# Patient Record
Sex: Male | Born: 1980 | Race: White | Hispanic: No | Marital: Single | Smoking: Current every day smoker
Health system: Southern US, Community
[De-identification: ages and names within clinical notes are randomized; demographics above are authoritative.]

## PROBLEM LIST (undated history)

## (undated) DIAGNOSIS — M549 Dorsalgia, unspecified: Secondary | ICD-10-CM

## (undated) DIAGNOSIS — N200 Calculus of kidney: Secondary | ICD-10-CM

## (undated) DIAGNOSIS — B1920 Unspecified viral hepatitis C without hepatic coma: Secondary | ICD-10-CM

## (undated) DIAGNOSIS — F319 Bipolar disorder, unspecified: Secondary | ICD-10-CM

## (undated) DIAGNOSIS — F259 Schizoaffective disorder, unspecified: Secondary | ICD-10-CM

## (undated) DIAGNOSIS — N442 Benign cyst of testis: Secondary | ICD-10-CM

## (undated) DIAGNOSIS — F250 Schizoaffective disorder, bipolar type: Secondary | ICD-10-CM

---

## 2005-02-27 ENCOUNTER — Emergency Department (HOSPITAL_COMMUNITY): Admission: EM | Admit: 2005-02-27 | Discharge: 2005-02-27 | Payer: Self-pay | Admitting: Emergency Medicine

## 2005-03-24 ENCOUNTER — Emergency Department (HOSPITAL_COMMUNITY): Admission: EM | Admit: 2005-03-24 | Discharge: 2005-03-25 | Payer: Self-pay | Admitting: Emergency Medicine

## 2005-04-30 ENCOUNTER — Emergency Department: Payer: Self-pay | Admitting: Emergency Medicine

## 2005-05-03 ENCOUNTER — Emergency Department: Payer: Self-pay | Admitting: Emergency Medicine

## 2005-05-13 ENCOUNTER — Emergency Department (HOSPITAL_COMMUNITY): Admission: EM | Admit: 2005-05-13 | Discharge: 2005-05-13 | Payer: Self-pay | Admitting: Emergency Medicine

## 2005-05-17 ENCOUNTER — Emergency Department (HOSPITAL_COMMUNITY): Admission: EM | Admit: 2005-05-17 | Discharge: 2005-05-17 | Payer: Self-pay | Admitting: Emergency Medicine

## 2005-05-18 ENCOUNTER — Emergency Department (HOSPITAL_COMMUNITY): Admission: EM | Admit: 2005-05-18 | Discharge: 2005-05-18 | Payer: Self-pay | Admitting: Emergency Medicine

## 2005-06-08 ENCOUNTER — Emergency Department: Payer: Self-pay | Admitting: Emergency Medicine

## 2005-06-08 ENCOUNTER — Other Ambulatory Visit: Payer: Self-pay

## 2009-04-14 ENCOUNTER — Emergency Department (HOSPITAL_COMMUNITY): Admission: EM | Admit: 2009-04-14 | Discharge: 2009-04-15 | Payer: Self-pay | Admitting: Emergency Medicine

## 2009-07-30 ENCOUNTER — Emergency Department (HOSPITAL_COMMUNITY): Admission: EM | Admit: 2009-07-30 | Discharge: 2009-07-31 | Payer: Self-pay | Admitting: Emergency Medicine

## 2009-08-16 ENCOUNTER — Emergency Department (HOSPITAL_COMMUNITY): Admission: EM | Admit: 2009-08-16 | Discharge: 2009-08-16 | Payer: Self-pay | Admitting: Emergency Medicine

## 2009-10-23 ENCOUNTER — Emergency Department (HOSPITAL_COMMUNITY): Admission: EM | Admit: 2009-10-23 | Discharge: 2009-10-24 | Payer: Self-pay | Admitting: Emergency Medicine

## 2009-11-08 ENCOUNTER — Emergency Department (HOSPITAL_COMMUNITY): Admission: EM | Admit: 2009-11-08 | Discharge: 2009-11-08 | Payer: Self-pay | Admitting: Emergency Medicine

## 2010-09-21 LAB — URINALYSIS, ROUTINE W REFLEX MICROSCOPIC
Bilirubin Urine: NEGATIVE
Glucose, UA: NEGATIVE mg/dL
Hgb urine dipstick: NEGATIVE
Ketones, ur: NEGATIVE mg/dL
Nitrite: NEGATIVE
Protein, ur: NEGATIVE mg/dL
Specific Gravity, Urine: 1.01 (ref 1.005–1.030)
Urobilinogen, UA: 0.2 mg/dL (ref 0.0–1.0)
pH: 7 (ref 5.0–8.0)

## 2010-10-06 LAB — BASIC METABOLIC PANEL
BUN: 10 mg/dL (ref 6–23)
CO2: 30 mEq/L (ref 19–32)
Calcium: 9.5 mg/dL (ref 8.4–10.5)
Chloride: 104 mEq/L (ref 96–112)
Creatinine, Ser: 0.82 mg/dL (ref 0.4–1.5)
GFR calc Af Amer: >60 mL/min (ref >60)
GFR calc non Af Amer: >60 mL/min (ref >60)
Glucose, Bld: 99 mg/dL (ref 70–99)
Potassium: 3.7 mEq/L (ref 3.5–5.1)
Sodium: 137 mEq/L (ref 135–145)

## 2010-10-06 LAB — DIFFERENTIAL
Basophils Absolute: 0 10*3/uL (ref 0.0–0.1)
Basophils Relative: 0 % (ref 0–1)
Eosinophils Absolute: 0.1 10*3/uL (ref 0.0–0.7)
Eosinophils Relative: 1 % (ref 0–5)
Lymphocytes Relative: 30 % (ref 12–46)
Lymphs Abs: 2.3 10*3/uL (ref 0.7–4.0)
Monocytes Absolute: 0.5 10*3/uL (ref 0.1–1.0)
Monocytes Relative: 6 % (ref 3–12)
Neutro Abs: 5 10*3/uL (ref 1.7–7.7)
Neutrophils Relative %: 63 % (ref 43–77)

## 2010-10-06 LAB — URINALYSIS, ROUTINE W REFLEX MICROSCOPIC
Bilirubin Urine: NEGATIVE
Glucose, UA: NEGATIVE mg/dL
Hgb urine dipstick: NEGATIVE
Ketones, ur: NEGATIVE mg/dL
Nitrite: NEGATIVE
Protein, ur: NEGATIVE mg/dL
Specific Gravity, Urine: <1.005 — ABNORMAL LOW (ref 1.005–1.030)
Urobilinogen, UA: 0.2 mg/dL (ref 0.0–1.0)
pH: 7 (ref 5.0–8.0)

## 2010-10-06 LAB — CBC
HCT: 45.9 % (ref 39.0–52.0)
Hemoglobin: 15.8 g/dL (ref 13.0–17.0)
MCHC: 34.3 g/dL (ref 30.0–36.0)
MCV: 93.9 fL (ref 78.0–100.0)
Platelets: 145 10*3/uL — ABNORMAL LOW (ref 150–400)
RBC: 4.89 MIL/uL (ref 4.22–5.81)
RDW: 13.8 % (ref 11.5–15.5)
WBC: 7.9 10*3/uL (ref 4.0–10.5)

## 2010-10-06 LAB — ETHANOL: Alcohol, Ethyl (B): <5 mg/dL (ref 0–10)

## 2010-10-06 LAB — RAPID URINE DRUG SCREEN, HOSP PERFORMED
Amphetamines: NOT DETECTED
Barbiturates: NOT DETECTED
Benzodiazepines: POSITIVE — AB
Cocaine: NOT DETECTED
Opiates: NOT DETECTED
Tetrahydrocannabinol: POSITIVE — AB

## 2011-01-17 ENCOUNTER — Emergency Department (HOSPITAL_COMMUNITY)
Admission: EM | Admit: 2011-01-17 | Discharge: 2011-01-17 | Disposition: A | Discharge status: Home / Self Care | Payer: Self-pay | Attending: Emergency Medicine | Admitting: Emergency Medicine

## 2011-01-17 DIAGNOSIS — K089 Disorder of teeth and supporting structures, unspecified: Secondary | ICD-10-CM | POA: Insufficient documentation

## 2011-01-17 DIAGNOSIS — K029 Dental caries, unspecified: Secondary | ICD-10-CM | POA: Insufficient documentation

## 2011-02-08 ENCOUNTER — Emergency Department (HOSPITAL_COMMUNITY)
Admission: EM | Admit: 2011-02-08 | Discharge: 2011-02-08 | Disposition: A | Discharge status: Home / Self Care | Payer: Self-pay | Attending: Emergency Medicine | Admitting: Emergency Medicine

## 2011-02-08 DIAGNOSIS — F17200 Nicotine dependence, unspecified, uncomplicated: Secondary | ICD-10-CM | POA: Insufficient documentation

## 2011-02-08 DIAGNOSIS — K029 Dental caries, unspecified: Secondary | ICD-10-CM | POA: Insufficient documentation

## 2011-02-08 HISTORY — DX: Bipolar disorder, unspecified: F31.9

## 2011-02-08 HISTORY — DX: Schizoaffective disorder, bipolar type: F25.0

## 2011-02-08 HISTORY — DX: Schizoaffective disorder, unspecified: F25.9

## 2011-02-08 MED ORDER — PENICILLIN V POTASSIUM 500 MG PO TABS: 500.0000 mg | ORAL_TABLET | Freq: Four times a day (QID) | ORAL | Status: AC

## 2011-02-08 MED ORDER — HYDROCODONE-ACETAMINOPHEN 5-325 MG PO TABS: ORAL_TABLET | ORAL | Status: DC

## 2011-02-08 NOTE — ED Provider Notes (Signed)
History     CSN: [id number] Arrival date & time: 02/08/2011  3:18 PM  Chief Complaint  Patient presents with  . Dental Pain   Patient is a 30 y.o. male presenting with tooth pain. The history is provided by the patient.  Dental PainThe primary symptoms include mouth pain. Primary symptoms do not include dental injury, oral bleeding, oral lesions, headaches, fever, shortness of breath, sore throat, angioedema or cough. The symptoms began 3 to 5 days ago. The symptoms are worsening. The symptoms are recurrent. The symptoms occur constantly.  Mouth pain began 3 - 5 days ago. Mouth pain occurs constantly. Affected locations include: teeth. At its highest the mouth pain was at 10/10. The mouth pain is currently at 10/10.  Additional symptoms include: gum tenderness. Additional symptoms do not include: gum swelling, purulent gums, trismus, jaw pain, facial swelling, trouble swallowing, pain with swallowing, ear pain and hearing loss.    Past Medical History  Diagnosis Date  . Bipolar 1 disorder   . Schizo affective schizophrenia     History reviewed. No pertinent past surgical history.  History reviewed. No pertinent family history.  History  Substance Use Topics  . Smoking status: Current Everyday Smoker    Types: Cigarettes  . Smokeless tobacco: Not on file  . Alcohol Use: No      Review of Systems  Constitutional: Negative for fever, chills and appetite change.  HENT: Positive for dental problem. Negative for hearing loss, ear pain, sore throat, facial swelling, trouble swallowing, neck pain and neck stiffness.   Respiratory: Negative for cough and shortness of breath.   Cardiovascular: Negative.   Gastrointestinal: Negative.   Musculoskeletal: Negative.   Skin: Negative.   Neurological: Negative for dizziness, weakness, numbness and headaches.  Hematological: Does not bruise/bleed easily.  Psychiatric/Behavioral: Negative for decreased concentration.    Physical Exam  BP  143/84  Pulse 82  Temp(Src) 98.2 F (36.8 C) (Oral)  Resp 20  Ht 6\' 1"  (1.854 m)  Wt 165 lb (74.844 kg)  BMI 21.77 kg/m2  SpO2 98%  Physical Exam  Nursing note and vitals reviewed. Constitutional: He appears well-developed and well-nourished. No distress.  HENT:  Right Ear: External ear normal.  Left Ear: External ear normal.  Mouth/Throat: Oropharynx is clear and moist and mucous membranes are normal. No posterior oropharyngeal edema or tonsillar abscesses.    Eyes: EOM are normal. Pupils are equal, round, and reactive to light.  Neck: Normal range of motion. Neck supple.  Cardiovascular: Normal rate, regular rhythm and normal heart sounds.   Pulmonary/Chest: Effort normal and breath sounds normal.  Abdominal: Soft. There is no tenderness.  Musculoskeletal: He exhibits no edema and no tenderness.  Lymphadenopathy:    He has no cervical adenopathy.  Neurological: He is alert. He has normal reflexes. He exhibits normal muscle tone. Coordination normal.  Skin: Skin is warm and dry.  Psychiatric: He has a normal mood and affect.    ED Course  Procedures  MDM  Patient is alert, NAD.  Multiple dental caries.  No abscess at this time.       Tammy L. Benton, [place] 02/08/11 1547 Medical screening examination/treatment/procedure(s) were performed by non-physician practitioner and as supervising physician I was immediately available for consultation/collaboration.  Jasmine Awe, MD 02/08/11 [phone]

## 2011-02-08 NOTE — ED Notes (Signed)
Dental pain. 

## 2011-07-07 ENCOUNTER — Encounter (HOSPITAL_COMMUNITY): Payer: Self-pay | Admitting: *Deleted

## 2011-07-07 ENCOUNTER — Emergency Department (HOSPITAL_COMMUNITY)
Admission: EM | Admit: 2011-07-07 | Discharge: 2011-07-07 | Disposition: A | Discharge status: Home / Self Care | Payer: Self-pay | Attending: Emergency Medicine | Admitting: Emergency Medicine

## 2011-07-07 DIAGNOSIS — K029 Dental caries, unspecified: Secondary | ICD-10-CM | POA: Insufficient documentation

## 2011-07-07 DIAGNOSIS — F411 Generalized anxiety disorder: Secondary | ICD-10-CM | POA: Insufficient documentation

## 2011-07-07 DIAGNOSIS — R450 Nervousness: Secondary | ICD-10-CM | POA: Insufficient documentation

## 2011-07-07 DIAGNOSIS — K089 Disorder of teeth and supporting structures, unspecified: Secondary | ICD-10-CM | POA: Insufficient documentation

## 2011-07-07 DIAGNOSIS — R000 Tachycardia, unspecified: Secondary | ICD-10-CM | POA: Insufficient documentation

## 2011-07-07 DIAGNOSIS — G479 Sleep disorder, unspecified: Secondary | ICD-10-CM | POA: Insufficient documentation

## 2011-07-07 DIAGNOSIS — R220 Localized swelling, mass and lump, head: Secondary | ICD-10-CM | POA: Insufficient documentation

## 2011-07-07 DIAGNOSIS — F17200 Nicotine dependence, unspecified, uncomplicated: Secondary | ICD-10-CM | POA: Insufficient documentation

## 2011-07-07 DIAGNOSIS — F319 Bipolar disorder, unspecified: Secondary | ICD-10-CM | POA: Insufficient documentation

## 2011-07-07 MED ORDER — HYDROCODONE-ACETAMINOPHEN 5-325 MG PO TABS: ORAL_TABLET | ORAL | Status: DC

## 2011-07-07 NOTE — ED Provider Notes (Signed)
History     CSN: [id number]  Arrival date & time 07/07/11  1346   First MD Initiated Contact with Patient 07/07/11 1419      Chief Complaint  Patient presents with  . Dental Pain    (Consider location/radiation/quality/duration/timing/severity/associated sxs/prior treatment) HPI Comments: Patient reports history of dental problems for quite some time. He currently wears an upper denture plate. He has multiple dental cavities of the lower teeth. He has pain from time to time. On yesterday January 3 he had pain that would not go away with conservative measures. He took some of the family members pain medication which helped some but he continues to have problems. He denies fever. He presents to the emergency department for assistance with his pain as he has been taking penicillin at home.  Patient is a 31 y.o. male presenting with tooth pain. The history is provided by the patient.  Dental PainPrimary symptoms do not include shortness of breath or cough.  Additional symptoms do not include: nosebleeds.    Past Medical History  Diagnosis Date  . Bipolar 1 disorder   . Schizo affective schizophrenia     History reviewed. No pertinent past surgical history.  No family history on file.  History  Substance Use Topics  . Smoking status: Current Everyday Smoker    Types: Cigarettes  . Smokeless tobacco: Not on file  . Alcohol Use: No      Review of Systems  Constitutional: Negative for activity change.       All ROS Neg except as noted in HPI  HENT: Positive for dental problem. Negative for nosebleeds and neck pain.   Eyes: Negative for photophobia and discharge.  Respiratory: Negative for cough, shortness of breath and wheezing.   Cardiovascular: Negative for chest pain and palpitations.  Gastrointestinal: Negative for abdominal pain and blood in stool.  Genitourinary: Negative for dysuria, frequency and hematuria.  Musculoskeletal: Negative for back pain and arthralgias.    Skin: Negative.   Neurological: Negative for dizziness, seizures and speech difficulty.  Psychiatric/Behavioral: Positive for sleep disturbance. Negative for hallucinations and confusion. The patient is nervous/anxious.     Allergies  Review of patient's allergies indicates no known allergies.  Home Medications   Current Outpatient Rx  Name Route Sig Dispense Refill  . HYDROCODONE-ACETAMINOPHEN 5-325 MG PO TABS  Take one tab q 4-6 hrs prn pain 10 tablet 0    BP 147/83  Pulse 107  Temp(Src) 98.2 F (36.8 C) (Oral)  Resp 16  SpO2 100%  Physical Exam  Nursing note and vitals reviewed. Constitutional: He is oriented to person, place, and time. He appears well-developed and well-nourished.  Non-toxic appearance.  HENT:  Head: Normocephalic.  Right Ear: Tympanic membrane and external ear normal.  Left Ear: Tympanic membrane and external ear normal.       Upper denture plate present. Multiple cavities of the teeth on the lower gum. Mild swelling at the dome line area. No visible abscess.  Eyes: EOM and lids are normal. Pupils are equal, round, and reactive to light.  Neck: Normal range of motion. Neck supple. Carotid bruit is not present.  Cardiovascular: Regular rhythm, normal heart sounds, intact distal pulses and normal pulses.  Tachycardia present.   No murmur heard. Pulmonary/Chest: Breath sounds normal. No respiratory distress.  Abdominal: Soft. Bowel sounds are normal. There is no tenderness. There is no guarding.  Musculoskeletal: Normal range of motion.  Lymphadenopathy:       Head (right side): No submandibular  adenopathy present.       Head (left side): No submandibular adenopathy present.    He has no cervical adenopathy.  Neurological: He is alert and oriented to person, place, and time. He has normal strength. No cranial nerve deficit or sensory deficit.  Skin: Skin is warm and dry.  Psychiatric: He has a normal mood and affect. His speech is normal.    ED  Course  Procedures (including critical care time) Pulse oximetry 100% on room air. Within normal limits by my interpretation. Labs Reviewed - No data to display No results found.   Dx: Dental caries   MDM  I have reviewed nursing notes, vital signs, and all appropriate lab and imaging results for this patient.        Kathie Dike, PA 07/07/11 [address] [place], [place] 07/07/11 1536  Kathie Dike, [place] 07/08/11 [phone]

## 2011-07-07 NOTE — ED Notes (Signed)
Right front tooth pain began yesterday. NAD.

## 2011-07-08 NOTE — ED Provider Notes (Signed)
Medical screening examination/treatment/procedure(s) were performed by non-physician practitioner and as supervising physician I was immediately available for consultation/collaboration.   Shelda Jakes, MD 07/08/11 2035

## 2011-10-11 ENCOUNTER — Emergency Department (HOSPITAL_COMMUNITY)
Admission: EM | Admit: 2011-10-11 | Discharge: 2011-10-11 | Discharge status: Against Medical Advice | Payer: Self-pay | Attending: Emergency Medicine | Admitting: Emergency Medicine

## 2011-10-11 ENCOUNTER — Encounter (HOSPITAL_COMMUNITY): Payer: Self-pay | Admitting: *Deleted

## 2011-10-11 DIAGNOSIS — R259 Unspecified abnormal involuntary movements: Secondary | ICD-10-CM | POA: Insufficient documentation

## 2011-10-11 DIAGNOSIS — F419 Anxiety disorder, unspecified: Secondary | ICD-10-CM

## 2011-10-11 DIAGNOSIS — F17200 Nicotine dependence, unspecified, uncomplicated: Secondary | ICD-10-CM | POA: Insufficient documentation

## 2011-10-11 DIAGNOSIS — F411 Generalized anxiety disorder: Secondary | ICD-10-CM | POA: Insufficient documentation

## 2011-10-11 DIAGNOSIS — F259 Schizoaffective disorder, unspecified: Secondary | ICD-10-CM | POA: Insufficient documentation

## 2011-10-11 DIAGNOSIS — F319 Bipolar disorder, unspecified: Secondary | ICD-10-CM | POA: Insufficient documentation

## 2011-10-11 LAB — COMPREHENSIVE METABOLIC PANEL
ALT: 20 U/L (ref 0–53)
AST: 22 U/L (ref 0–37)
Albumin: 3.9 g/dL (ref 3.5–5.2)
Alkaline Phosphatase: 93 U/L (ref 39–117)
BUN: 14 mg/dL (ref 6–23)
CO2: 25 mEq/L (ref 19–32)
Calcium: 9.7 mg/dL (ref 8.4–10.5)
Creatinine, Ser: 0.76 mg/dL (ref 0.50–1.35)
GFR calc non Af Amer: >90 mL/min (ref >90)
Glucose, Bld: 95 mg/dL (ref 70–99)
Potassium: 4.8 mEq/L (ref 3.5–5.1)
Sodium: 141 mEq/L (ref 135–145)
Total Bilirubin: 0.2 mg/dL — ABNORMAL LOW (ref 0.3–1.2)
Total Protein: 7.5 g/dL (ref 6.0–8.3)

## 2011-10-11 LAB — CBC
HCT: 46.1 % (ref 39.0–52.0)
Hemoglobin: 15.6 g/dL (ref 13.0–17.0)
MCH: 31.9 pg (ref 26.0–34.0)
MCHC: 33.8 g/dL (ref 30.0–36.0)
MCV: 94.3 fL (ref 78.0–100.0)
Platelets: 190 10*3/uL (ref 150–400)
RBC: 4.89 MIL/uL (ref 4.22–5.81)
RDW: 13.4 % (ref 11.5–15.5)
WBC: 10 10*3/uL (ref 4.0–10.5)

## 2011-10-11 LAB — ETHANOL: Alcohol, Ethyl (B): <11 mg/dL (ref 0–11)

## 2011-10-11 MED ORDER — LORAZEPAM 1 MG PO TABS
1.0000 mg | ORAL_TABLET | Freq: Once | ORAL | Status: AC
  Administered 2011-10-11: 1 mg via ORAL
  Filled 2011-10-11: qty 1

## 2011-10-11 NOTE — ED Notes (Signed)
Pt states, "I am bipolar & schizophrenic. I stopped taking my meds ago b/c they were not working. The last 3 wks have been bad. I haven't been able to sleep & I get really agitated. I have real bad anger issues." pt denies SI & HI, pt denies auditory & visual hallucinations, pt c/o severe shakes, pt states, "I have taken a pain pill, 5 mg Vicodin & a nerve pill before I got here." pt denies use of street drugs

## 2011-10-11 NOTE — ED Notes (Signed)
Was informed by pt he is leaving, pt states, "I have to leave I am on someone else's vehicle." pt denies SI & HI & verbalizes understanding of leaving without further evaluation & tx

## 2011-10-11 NOTE — ED Notes (Signed)
Pt reports being off his psych meds x 6-7 months, having anxiety, crying frequently, unable to sleep. Denies any SI or HI. Calm and cooperative at triage.

## 2011-10-11 NOTE — ED Provider Notes (Signed)
History     CSN: [id number]  Arrival date & time 10/11/11  1313   First MD Initiated Contact with Patient 10/11/11 1504      Chief Complaint  Patient presents with  . Anxiety  . Tremors    (Consider location/radiation/quality/duration/timing/severity/associated sxs/prior treatment) Patient is a 31 y.o. male presenting with anxiety. The history is provided by the patient.  Anxiety This is a recurrent problem. Episode onset: 6-7 months ago. The problem occurs constantly. The problem has been gradually worsening. Pertinent negatives include no chest pain, no abdominal pain and no shortness of breath. Associated symptoms comments: Feeling very angry and his family members like he may snap. The symptoms are aggravated by stress. The symptoms are relieved by medications. Treatments tried: Took I nerve pill today which helped. The treatment provided moderate relief.    Past Medical History  Diagnosis Date  . Bipolar 1 disorder   . Schizo affective schizophrenia     History reviewed. No pertinent past surgical history.  History reviewed. No pertinent family history.  History  Substance Use Topics  . Smoking status: Current Everyday Smoker    Types: Cigarettes  . Smokeless tobacco: Not on file  . Alcohol Use: No      Review of Systems  Respiratory: Negative for shortness of breath.   Cardiovascular: Negative for chest pain.  Gastrointestinal: Negative for abdominal pain.  Psychiatric/Behavioral: Positive for sleep disturbance and decreased concentration. Negative for suicidal ideas and hallucinations. The patient is nervous/anxious.   All other systems reviewed and are negative.    Allergies  Review of patient's allergies indicates no known allergies.  Home Medications   Current Outpatient Rx  Name Route Sig Dispense Refill  . HYDROCODONE-ACETAMINOPHEN 5-325 MG PO TABS Oral Take 1-2 tablets by mouth every 4 (four) hours as needed. As needed for tooth pain.      BP  125/80  Pulse 91  Temp(Src) 98.1 F (36.7 C) (Oral)  Resp 16  SpO2 99%  Physical Exam  Nursing note and vitals reviewed. Constitutional: He is oriented to person, place, and time. He appears well-developed and well-nourished. No distress.  HENT:  Head: Normocephalic and atraumatic.  Mouth/Throat: Oropharynx is clear and moist.  Eyes: Conjunctivae and EOM are normal. Pupils are equal, round, and reactive to light.  Neck: Normal range of motion. Neck supple.  Cardiovascular: Normal rate, regular rhythm and intact distal pulses.   No murmur heard. Pulmonary/Chest: Effort normal and breath sounds normal. No respiratory distress. He has no wheezes. He has no rales.  Abdominal: Soft. He exhibits no distension. There is no tenderness. There is no rebound and no guarding.  Musculoskeletal: Normal range of motion. He exhibits no edema and no tenderness.  Neurological: He is alert and oriented to person, place, and time.  Skin: Skin is warm and dry. No rash noted. No erythema.  Psychiatric: His mood appears anxious. His speech is rapid and/or pressured. He is not actively hallucinating. Thought content is not paranoid. He exhibits a depressed mood. He expresses no homicidal and no suicidal ideation.    ED Course  Procedures (including critical care time)  Labs Reviewed  COMPREHENSIVE METABOLIC PANEL - Abnormal; Notable for the following:    Total Bilirubin 0.2 (*)    All other components within normal limits  ETHANOL  CBC   No results found.   No diagnosis found.    MDM   Has been off his medications for 6-7 months now and will drive today because he's  having frequent crying, anxiety, depression and feeling on edge. He states his family and friends or straining and make him so angry he is afraid he may snap. He denies any medical issues at this time. He says he's been taking pain medication for his teeth and has been very anxious it took one of the neighbor's nerve pills today which  did help. Will speak with [facility] about placement. Labs pending.  Patient decided he went to leave and because he was not homicidal or suicidal he is welcome to do that.        Gwyneth Sprout, MD 10/11/11 [phone]

## 2011-12-17 ENCOUNTER — Emergency Department (HOSPITAL_COMMUNITY)
Admission: EM | Admit: 2011-12-17 | Discharge: 2011-12-17 | Disposition: A | Discharge status: Home / Self Care | Payer: Self-pay | Attending: Emergency Medicine | Admitting: Emergency Medicine

## 2011-12-17 ENCOUNTER — Encounter (HOSPITAL_COMMUNITY): Payer: Self-pay | Admitting: Emergency Medicine

## 2011-12-17 DIAGNOSIS — F319 Bipolar disorder, unspecified: Secondary | ICD-10-CM | POA: Insufficient documentation

## 2011-12-17 DIAGNOSIS — F17200 Nicotine dependence, unspecified, uncomplicated: Secondary | ICD-10-CM | POA: Insufficient documentation

## 2011-12-17 DIAGNOSIS — K089 Disorder of teeth and supporting structures, unspecified: Secondary | ICD-10-CM | POA: Insufficient documentation

## 2011-12-17 DIAGNOSIS — K029 Dental caries, unspecified: Secondary | ICD-10-CM

## 2011-12-17 DIAGNOSIS — K0889 Other specified disorders of teeth and supporting structures: Secondary | ICD-10-CM

## 2011-12-17 MED ORDER — HYDROCODONE-ACETAMINOPHEN 5-325 MG PO TABS: ORAL_TABLET | ORAL | Status: AC

## 2011-12-17 MED ORDER — PENICILLIN V POTASSIUM 250 MG PO TABS: 250.0000 mg | ORAL_TABLET | Freq: Four times a day (QID) | ORAL | Status: AC

## 2011-12-17 NOTE — ED Provider Notes (Signed)
History     CSN: [id number]  Arrival date & time 12/17/11  1437   First MD Initiated Contact with Patient 12/17/11 1453      Chief Complaint  Patient presents with  . Dental Pain    HPI Pt was seen at 1455.  Per pt, c/o gradual onset and persistence of constant bilat lower teeth "pain" for the past several days.  Denies fevers, no intra-oral edema, no rash, no facial swelling, no dysphagia, no neck pain.   The condition is aggravated by nothing. The condition is relieved by nothing. The symptoms have been associated with no other complaints. The patient has no significant history of serious medical conditions.    Past Medical History  Diagnosis Date  . Bipolar 1 disorder   . Schizo affective schizophrenia     History reviewed. No pertinent past surgical history.  Family History  Problem Relation Age of Onset  . Stroke Father   . Hypertension Father     History  Substance Use Topics  . Smoking status: Current Everyday Smoker -- 1.0 packs/day for 15 years    Types: Cigarettes  . Smokeless tobacco: Never Used  . Alcohol Use: No     Review of Systems ROS: Statement: All systems negative except as marked or noted in the HPI; Constitutional: Negative for fever and chills. ; ; Eyes: Negative for eye pain and discharge. ; ; ENMT: Positive for dental caries, dental hygiene poor and toothache. Negative for ear pain, bleeding gums, dental injury, facial deformity, facial swelling, hoarseness, nasal congestion, sinus pressure, sore throat, throat swelling and tongue swollen. ; ; Cardiovascular: Negative for chest pain, palpitations, diaphoresis, dyspnea and peripheral edema. ; ; Respiratory: Negative for cough, wheezing and stridor. ; ; Gastrointestinal: Negative for nausea, vomiting, diarrhea and abdominal pain. ; ; Genitourinary: Negative for dysuria, flank pain and hematuria. ; ; Musculoskeletal: Negative for back pain and neck pain. ; ; Skin: Negative for rash and skin lesion. ; ;  Neuro: Negative for headache, lightheadedness and neck stiffness. ;    Allergies  Review of patient's allergies indicates no known allergies.  Home Medications   Current Outpatient Rx  Name Route Sig Dispense Refill  . HYDROCODONE-ACETAMINOPHEN 5-325 MG PO TABS Oral Take 1-2 tablets by mouth every 4 (four) hours as needed. As needed for tooth pain.      There were no vitals taken for this visit.  Physical Exam 1500: Physical examination: Vital signs and O2 SAT: Reviewed; Constitutional: Well developed, Well nourished, Well hydrated, In no acute distress; Head and Face: Normocephalic, Atraumatic; Eyes: EOMI, PERRL, No scleral icterus; ENMT: Mouth and pharynx normal, Poor dentition, Widespread dental decay, Left TM normal, Right TM normal, Mucous membranes moist, +bilat lower canines, 1st and 2nd premolars with extensive dental decay. Multiple missing teeth. No gingival erythema, edema, fluctuance, or drainage.  No hoarse voice, no drooling, no stridor.  ; Neck: Supple, Full range of motion, No lymphadenopathy; Cardiovascular: Regular rate and rhythm, No murmur, rub, or gallop; Respiratory: Breath sounds clear & equal bilaterally, No rales, rhonchi, wheezes, or rub, Normal respiratory effort/excursion; Chest: Nontender, Movement normal; Extremities: Pulses normal, No tenderness, No edema; Neuro: AA&Ox3, Major CN grossly intact.  No gross focal motor or sensory deficits in extremities.; Skin: Color normal, No rash, No petechiae, Warm, Dry    ED Course  Procedures   MDM  MDM Reviewed: nursing note, vitals and previous chart            Laray Anger,  DO 12/18/11 1709

## 2011-12-17 NOTE — ED Notes (Signed)
Patient c/o left lower dental pain x 2 days. °

## 2011-12-17 NOTE — Discharge Instructions (Signed)
RESOURCE GUIDE  Chronic Pain Problems: Contact [facility]  [phone] Patients need to be referred by their primary care doctor.  Insufficient Money for Medicine: Contact United Way:  call "211" or [facility] [phone].  No Primary Care Doctor: - Call [facility]  [phone] - can help you locate a primary care doctor that  accepts your insurance, provides certain services, etc. - Physician Referral Service- 1-800-533-3463  Agencies that provide inexpensive medical care: - [facility]  [phone] - [facility]  [phone] - [facility]  [phone] - [facility]  [phone] - Planned Parenthood  [phone] - [facility]  [phone]  Medicaid-accepting [place] Providers: - [facility]- [address]  [phone], Mon-Fri 9am-7pm, Sat 9am-1pm - [facility]- [address], [address]  [phone] - [facility]- [address], [address]  [phone] - [facility]- [address] [place] [address]  [phone] - Veita Bland- [address], [address], [phone]  Only accepts [place] Access Medicaid patients after they have their name  applied to their card  Self Pay (no insurance) in [place]: - Sickle Cell Patients: Dr Eric Dean, [facility]  [address], [phone] - [facility]- [address]  [phone]       -     [facility]- [address]       -     [facility]- see information above (Speak to Pam H if you do not have insurance)       -  [facility]- [address], [phone]       -  [facility] [place]- [address],  [phone]       -  [facility]- [address] [place] [address], [phone]       -  Dr Osei-Bonsu-  [address], [place], [phone]       -  [facility]- [address], [phone]       -  [facility]- [address] [place] [address], [phone], also [address], [phone]       -    [facility]- [address], [phone], 1st & 3rd Saturday   every month, 10am-1pm  1) Find a Doctor and Pay Out of Pocket Although you won't have to find out who is covered by your insurance plan, it is a good idea to ask around and get recommendations. You will then need to call the office and see if the doctor you have chosen will accept you as a new patient and what types of options they offer for patients who are self-pay. Some doctors offer discounts or will set up payment plans for their patients who do not have insurance, but you will need to ask so you aren't surprised when you get to your appointment.  2) Contact Your Local Health Department Not all health departments have doctors that can see patients for sick visits, but many do, so it is worth a call to see if yours does. If you don't know where your local health department is, you can check in your phone book. The CDC also has a tool to help you locate your state's health department, and many state websites also have   listings of all of their local health departments.  3) Find a Walk-in Clinic If your illness is not likely to be very severe or complicated, you may want to try a walk in clinic. These are popping up all over the country in pharmacies, drugstores, and shopping centers. They're usually staffed by nurse practitioners or physician assistants that have been trained to treat common illnesses and complaints. They're usually fairly quick and inexpensive. However, if you have serious medical issues or chronic medical problems, these are probably not your best option  STD Testing - [place] [facility] [place], [facility], [address], [place], phone [phone] or 1-877-539-9860.  Monday - Friday, call for an appointment. - [place]  [facility] [place], [facility] Clinic, [address], [place], phone [phone] or 1-877-539-9860.  Monday - Friday, call for an appointment.  Abuse/Neglect: - [place] Child Abuse Hotline (336) 641-3795 - [place] Child Abuse Hotline 800-378-5315 (After Hours)  Emergency Shelter:  [facility] (336) 271-5985  Maternity Homes: - [facility] (336) 275-9566 - [facility] (704) 372-4663  MRSA Hotline #:   832-7006  [place] Resources  [facility] [place]  [employer] [place] [facility] [address]                 [address]         [address]  [place]                                               [place]                              [place] Phone:  [phone]                                  Phone:  [phone]                   Phone:  [phone]  [place] [facility], [phone] - [place] Services - [facility]- 1-888-581-9988       -     [facility] in [place], [address],                                  336-349-4454, Insurance  [place] Child Abuse Hotline (336) 342-1394 or (336) 342-3537 (After Hours)   Behavioral Health Services  Substance Abuse Resources: - [facility]  336-882-2125 - [facility] 336-784-9470 - [facility] 336-285-9073 - [facility] 336-845-3988 - Residential & Outpatient Substance Abuse Program  800-659-3381  Psychological Services: -  Health  [phone] - [facility]  [phone] - [place] [facility], [address], [place], ACCESS LINE: 1-800-853-5163 or 336-641-4981, Http://www.[facility].com/services/adult.htm  Dental Assistance  If unable to pay or uninsured, contact:  [facility] or [place] [facility] to become qualified for the adult dental  clinic.  Patients with Medicaid:  [facility] [address], [phone], [phone]  If unable   to pay, or uninsured, contact [facility] (828)032-9472) or [facility] Department [phone] in [place], [phone] in [facility] to become qualified for the adult dental clinic  Other Low-Cost Community Dental Services: - [facility]- [address] [place], [place], [place], [address], [phone], [phone], 2nd and 4th Thursday of the month at 6:30am.  10 clients each day by appointment, can sometimes see walk-in patients if someone does not show for an appointment. [facility]- [address] [place], [place], [address], [phone] - [facility]- [address] [place], [place], [address], [phone] [facility]- [phone] [facility]- [phone] [facility] [phone]     Take the prescriptions as directed.  Call your regular dentist tomorrow to schedule a follow up appointment within the next week.  Return to the Emergency Department immediately sooner if worsening.

## 2011-12-17 NOTE — ED Notes (Signed)
Patient with no complaints at this time. Respirations even and unlabored. Skin warm/dry. Discharge instructions reviewed with patient at this time. Patient given opportunity to voice concerns/ask questions. Patient discharged at this time and left Emergency Department with steady gait.   

## 2012-01-19 ENCOUNTER — Emergency Department (HOSPITAL_COMMUNITY)
Admission: EM | Admit: 2012-01-19 | Discharge: 2012-01-19 | Disposition: A | Discharge status: Home / Self Care | Payer: Self-pay | Attending: Emergency Medicine | Admitting: Emergency Medicine

## 2012-01-19 ENCOUNTER — Encounter (HOSPITAL_COMMUNITY): Payer: Self-pay | Admitting: *Deleted

## 2012-01-19 DIAGNOSIS — F17200 Nicotine dependence, unspecified, uncomplicated: Secondary | ICD-10-CM | POA: Insufficient documentation

## 2012-01-19 DIAGNOSIS — S335XXA Sprain of ligaments of lumbar spine, initial encounter: Secondary | ICD-10-CM | POA: Insufficient documentation

## 2012-01-19 DIAGNOSIS — S39012A Strain of muscle, fascia and tendon of lower back, initial encounter: Secondary | ICD-10-CM

## 2012-01-19 DIAGNOSIS — F319 Bipolar disorder, unspecified: Secondary | ICD-10-CM | POA: Insufficient documentation

## 2012-01-19 DIAGNOSIS — F259 Schizoaffective disorder, unspecified: Secondary | ICD-10-CM | POA: Insufficient documentation

## 2012-01-19 DIAGNOSIS — X58XXXA Exposure to other specified factors, initial encounter: Secondary | ICD-10-CM | POA: Insufficient documentation

## 2012-01-19 MED ORDER — IBUPROFEN 800 MG PO TABS
800.0000 mg | ORAL_TABLET | Freq: Once | ORAL | Status: AC
  Administered 2012-01-19: 800 mg via ORAL
  Filled 2012-01-19: qty 1

## 2012-01-19 MED ORDER — IBUPROFEN 600 MG PO TABS: 600.0000 mg | ORAL_TABLET | Freq: Four times a day (QID) | ORAL | Status: AC | PRN

## 2012-01-19 MED ORDER — OXYCODONE-ACETAMINOPHEN 5-325 MG PO TABS: 1.0000 | ORAL_TABLET | ORAL | Status: AC | PRN

## 2012-01-19 MED ORDER — CYCLOBENZAPRINE HCL 5 MG PO TABS: 5.0000 mg | ORAL_TABLET | Freq: Three times a day (TID) | ORAL | Status: AC | PRN

## 2012-01-19 MED ORDER — OXYCODONE-ACETAMINOPHEN 5-325 MG PO TABS
1.0000 | ORAL_TABLET | Freq: Once | ORAL | Status: AC
  Administered 2012-01-19: 1 via ORAL
  Filled 2012-01-19: qty 1

## 2012-01-19 NOTE — ED Notes (Signed)
Onset Monday with back pain, No recent injury.  Increased pain with movement

## 2012-01-19 NOTE — ED Provider Notes (Signed)
History     CSN: [id number]  Arrival date & time 01/19/12  1138   First MD Initiated Contact with Patient 01/19/12 1154      Chief Complaint  Patient presents with  . Back Pain    (Consider location/radiation/quality/duration/timing/severity/associated sxs/prior treatment) HPI Comments: DAEGAN ARIZMENDI presents with acute on chronic low back pain which has worsened for the past 5 days.  Patient denies any new injury specifically but works as a Pharmacologist and quite active, but denies any heavy lifting.  There no radiation into the lower extremities.  There has been no weakness or numbness in the lower extremities and no urinary or bowel retention or incontinence.   The history is provided by the patient.    Past Medical History  Diagnosis Date  . Bipolar 1 disorder   . Schizo affective schizophrenia     History reviewed. No pertinent past surgical history.  Family History  Problem Relation Age of Onset  . Stroke Father   . Hypertension Father     History  Substance Use Topics  . Smoking status: Current Everyday Smoker -- 1.0 packs/day for 15 years    Types: Cigarettes  . Smokeless tobacco: Never Used  . Alcohol Use: No      Review of Systems  Constitutional: Negative for fever.  Respiratory: Negative for shortness of breath.   Cardiovascular: Negative for chest pain and leg swelling.  Gastrointestinal: Negative for abdominal pain, constipation and abdominal distention.  Genitourinary: Negative for dysuria, urgency, frequency, flank pain and difficulty urinating.  Musculoskeletal: Positive for back pain. Negative for joint swelling and gait problem.  Skin: Negative for rash.  Neurological: Negative for weakness and numbness.    Allergies  Review of patient's allergies indicates no known allergies.  Home Medications   Current Outpatient Rx  Name Route Sig Dispense Refill  . CYCLOBENZAPRINE HCL 5 MG PO TABS Oral Take 1 tablet (5 mg total) by mouth 3  (three) times daily as needed for muscle spasms. 15 tablet 0  . HYDROCODONE-ACETAMINOPHEN 5-325 MG PO TABS Oral Take 1-2 tablets by mouth every 4 (four) hours as needed. As needed for tooth pain.    . IBUPROFEN 600 MG PO TABS Oral Take 1 tablet (600 mg total) by mouth every 6 (six) hours as needed for pain. 30 tablet 0  . OXYCODONE-ACETAMINOPHEN 5-325 MG PO TABS Oral Take 1 tablet by mouth every 4 (four) hours as needed for pain. 20 tablet 0    BP 140/87  Pulse 82  Temp 98.4 F (36.9 C) (Oral)  Resp 20  Ht 6\' 1"  (1.854 m)  Wt 168 lb (76.204 kg)  BMI 22.16 kg/m2  SpO2 99%  Physical Exam  Nursing note and vitals reviewed. Constitutional: He appears well-developed and well-nourished.  HENT:  Head: Normocephalic.  Eyes: Conjunctivae are normal.  Neck: Normal range of motion. Neck supple.  Cardiovascular: Normal rate and intact distal pulses.        Pedal pulses normal.  Pulmonary/Chest: Effort normal.  Abdominal: Soft. Bowel sounds are normal. He exhibits no distension and no mass.  Musculoskeletal: Normal range of motion. He exhibits no edema.       Lumbar back: He exhibits tenderness. He exhibits no swelling, no edema and no spasm.  Neurological: He is alert. He has normal strength. He displays no atrophy and no tremor. No sensory deficit. Gait normal.  Reflex Scores:      Patellar reflexes are 2+ on the right side and 2+ on  the left side.      Achilles reflexes are 2+ on the right side and 2+ on the left side.      No strength deficit noted in hip and knee flexor and extensor muscle groups.  Ankle flexion and extension intact.  Skin: Skin is warm and dry.  Psychiatric: He has a normal mood and affect.    ED Course  Procedures (including critical care time)  Labs Reviewed - No data to display No results found.   1. Low back strain       MDM  Ibuprofen,  Flexeril,  Oxycodone.  Heat,  Rest.  No neuro deficit on exam or by history to suggest emergent or surgical  presentation.  Referrals given for primary care.           Burgess Amor, [place] 01/19/12 1241

## 2012-01-23 NOTE — ED Provider Notes (Signed)
Medical screening examination/treatment/procedure(s) were performed by non-physician practitioner and as supervising physician I was immediately available for consultation/collaboration.   Shelda Jakes, MD 01/23/12 2150

## 2012-02-16 ENCOUNTER — Encounter (HOSPITAL_COMMUNITY): Payer: Self-pay | Admitting: Emergency Medicine

## 2012-02-16 ENCOUNTER — Emergency Department (HOSPITAL_COMMUNITY)
Admission: EM | Admit: 2012-02-16 | Discharge: 2012-02-16 | Disposition: A | Discharge status: Home / Self Care | Payer: Self-pay | Attending: Emergency Medicine | Admitting: Emergency Medicine

## 2012-02-16 DIAGNOSIS — K029 Dental caries, unspecified: Secondary | ICD-10-CM

## 2012-02-16 DIAGNOSIS — K0889 Other specified disorders of teeth and supporting structures: Secondary | ICD-10-CM

## 2012-02-16 DIAGNOSIS — F17200 Nicotine dependence, unspecified, uncomplicated: Secondary | ICD-10-CM | POA: Insufficient documentation

## 2012-02-16 DIAGNOSIS — F209 Schizophrenia, unspecified: Secondary | ICD-10-CM | POA: Insufficient documentation

## 2012-02-16 DIAGNOSIS — F319 Bipolar disorder, unspecified: Secondary | ICD-10-CM | POA: Insufficient documentation

## 2012-02-16 DIAGNOSIS — K089 Disorder of teeth and supporting structures, unspecified: Secondary | ICD-10-CM | POA: Insufficient documentation

## 2012-02-16 MED ORDER — NAPROXEN 250 MG PO TABS: 250.0000 mg | ORAL_TABLET | Freq: Two times a day (BID) | ORAL | Status: DC

## 2012-02-16 MED ORDER — PENICILLIN V POTASSIUM 250 MG PO TABS: 250.0000 mg | ORAL_TABLET | Freq: Four times a day (QID) | ORAL | Status: AC

## 2012-02-16 MED ORDER — HYDROCODONE-ACETAMINOPHEN 5-325 MG PO TABS: ORAL_TABLET | ORAL | Status: AC

## 2012-02-16 NOTE — ED Notes (Signed)
Patient complaining of left sided dental pain that started approximately 4 days ago, states radiates into upper cheek. Reports getting worse yesterday.

## 2012-02-16 NOTE — ED Provider Notes (Signed)
History     CSN: [id number]  Arrival date & time 02/16/12  4098   First MD Initiated Contact with Patient 02/16/12 [phone]      Chief Complaint  Patient presents with  . Dental Pain     HPI Pt was seen at 0710.  Per pt, c/o gradual onset and persistence of constant left lower tooth "pain" for the past 4 days.  Denies fevers, no intra-oral edema, no rash, no facial swelling, no dysphagia, no neck pain.   The condition is aggravated by nothing. The condition is relieved by nothing. The symptoms have been associated with no other complaints. The patient has no significant history of serious medical conditions.     Past Medical History  Diagnosis Date  . Bipolar 1 disorder   . Schizo affective schizophrenia     History reviewed. No pertinent past surgical history.  Family History  Problem Relation Age of Onset  . Stroke Father   . Hypertension Father     History  Substance Use Topics  . Smoking status: Current Everyday Smoker -- 1.0 packs/day for 15 years    Types: Cigarettes  . Smokeless tobacco: Never Used  . Alcohol Use: No    Review of Systems ROS: Statement: All systems negative except as marked or noted in the HPI; Constitutional: Negative for fever and chills. ; ; Eyes: Negative for eye pain and discharge. ; ; ENMT: Positive for dental caries, dental hygiene poor and toothache. Negative for ear pain, bleeding gums, dental injury, facial deformity, facial swelling, hoarseness, nasal congestion, sinus pressure, sore throat, throat swelling and tongue swollen. ; ; Cardiovascular: Negative for chest pain, palpitations, diaphoresis, dyspnea and peripheral edema. ; ; Respiratory: Negative for cough, wheezing and stridor. ; ; Gastrointestinal: Negative for nausea, vomiting, diarrhea and abdominal pain. ; ; Genitourinary: Negative for dysuria, flank pain and hematuria. ; ; Musculoskeletal: Negative for back pain and neck pain. ; ; Skin: Negative for rash and skin lesion. ; ; Neuro:  Negative for headache, lightheadedness and neck stiffness. ;    Allergies  Review of patient's allergies indicates no known allergies.  Home Medications   Current Outpatient Rx  Name Route Sig Dispense Refill  . HYDROCODONE-ACETAMINOPHEN 5-325 MG PO TABS Oral Take 1-2 tablets by mouth every 4 (four) hours as needed. As needed for tooth pain.      BP 132/85  Pulse 80  Temp 97.7 F (36.5 C) (Oral)  Resp 16  Ht 6\' 1"  (1.854 m)  Wt 180 lb (81.647 kg)  BMI 23.75 kg/m2  SpO2 100%  Physical Exam 0715: Physical examination: Vital signs and O2 SAT: Reviewed; Constitutional: Well developed, Well nourished, Well hydrated, In no acute distress; Head and Face: Normocephalic, Atraumatic; Eyes: EOMI, PERRL, No scleral icterus; ENMT: Mouth and pharynx normal, Poor dentition, Widespread dental decay, Left TM normal, Right TM normal, Mucous membranes moist, +lower left canine and premolars with extensive dental decay.  No gingival erythema, edema, fluctuance, or drainage.  No hoarse voice, no drooling, no stridor.  ; Neck: Supple, Full range of motion, No lymphadenopathy; Cardiovascular: Regular rate and rhythm, No murmur, rub, or gallop; Respiratory: Breath sounds clear & equal bilaterally, No rales, rhonchi, wheezes, or rub, Normal respiratory effort/excursion; Chest: Nontender, Movement normal; Extremities: Pulses normal, No tenderness, No edema; Neuro: AA&Ox3, Major CN grossly intact.  No gross focal motor or sensory deficits in extremities.; Skin: Color normal, No rash, No petechiae, Warm, Dry   ED Course  Procedures   MDM  MDM Reviewed: nursing note and vitals      0725:  Pt encouraged to f/u with dentist or oral surgeon for his dental needs for good continuity of care and definitive treatment.  Verb understanding.        Laray Anger, DO 02/16/12 [place]

## 2012-02-17 ENCOUNTER — Emergency Department (HOSPITAL_COMMUNITY)
Admission: EM | Admit: 2012-02-17 | Discharge: 2012-02-17 | Disposition: A | Discharge status: Home / Self Care | Payer: Self-pay | Attending: Emergency Medicine | Admitting: Emergency Medicine

## 2012-02-17 ENCOUNTER — Encounter (HOSPITAL_COMMUNITY): Payer: Self-pay | Admitting: *Deleted

## 2012-02-17 DIAGNOSIS — K0889 Other specified disorders of teeth and supporting structures: Secondary | ICD-10-CM

## 2012-02-17 DIAGNOSIS — F259 Schizoaffective disorder, unspecified: Secondary | ICD-10-CM | POA: Insufficient documentation

## 2012-02-17 DIAGNOSIS — F319 Bipolar disorder, unspecified: Secondary | ICD-10-CM | POA: Insufficient documentation

## 2012-02-17 DIAGNOSIS — F17200 Nicotine dependence, unspecified, uncomplicated: Secondary | ICD-10-CM | POA: Insufficient documentation

## 2012-02-17 DIAGNOSIS — K089 Disorder of teeth and supporting structures, unspecified: Secondary | ICD-10-CM | POA: Insufficient documentation

## 2012-02-17 NOTE — ED Provider Notes (Signed)
History  This chart was scribed for Flint Melter, MD by Bennett Scrape. This patient was seen in room [facility] and the patient's care was started at 2:00PM.  CSN: [id number]  Arrival date & time 02/17/12  1346   First MD Initiated Contact with Patient 02/17/12 1400      Chief Complaint  Patient presents with  . Dental Pain    The history is provided by the patient. No language interpreter was used.     Nicholas Nielsen is a 31 y.o. male who presents to the Emergency Department complaining of 4 days of gradual onset, gradually worsening, constant left lower dental pain described as shooting. The pain is worse with eating and at night. He was seen yesterday in this ED for the same and was discharged home with prescriptions for Norco, Naprosyn and Veetid. He states that he has been taking the medications and using hot compresses with minimal improvement in his symptoms. He reports that he has an appointment with [facility] in 6 days but wanted to be re-evaluated and given different medications to treat his symptoms. He denies fever, neck pain, sore throat, visual disturbance, CP, SOB, abdominal pain, urinary symptoms, back pain, HA, and rash as associated symptoms.  He has a h/o bipolar disorder and schizo affective schizophrenia. He is a current everyday smoker but denies alcohol use.  Past Medical History  Diagnosis Date  . Bipolar 1 disorder   . Schizo affective schizophrenia     History reviewed. No pertinent past surgical history.  Family History  Problem Relation Age of Onset  . Stroke Father   . Hypertension Father     History  Substance Use Topics  . Smoking status: Current Everyday Smoker -- 1.0 packs/day for 15 years    Types: Cigarettes  . Smokeless tobacco: Never Used  . Alcohol Use: No      Review of Systems  A complete 10 system review of systems was obtained and all systems are negative except as noted in the HPI and PMH.   Allergies  Review  of patient's allergies indicates no known allergies.  Home Medications   Current Outpatient Rx  Name Route Sig Dispense Refill  . HYDROCODONE-ACETAMINOPHEN 5-325 MG PO TABS Oral Take 1-2 tablets by mouth every 4 (four) hours as needed. As needed for tooth pain.    Marland Kitchen HYDROCODONE-ACETAMINOPHEN 5-325 MG PO TABS  1 or 2 tabs PO q6 hours prn pain 20 tablet 0  . NAPROXEN 250 MG PO TABS Oral Take 1 tablet (250 mg total) by mouth 2 (two) times daily with a meal. 14 tablet 0  . PENICILLIN V POTASSIUM 250 MG PO TABS Oral Take 1 tablet (250 mg total) by mouth 4 (four) times daily. 20 tablet 0    Triage Vitals: BP 143/89  Pulse 83  Temp 98.2 F (36.8 C) (Oral)  Resp 18  Ht 6\' 1"  (1.854 m)  Wt 180 lb (81.647 kg)  BMI 23.75 kg/m2  SpO2 100%  Physical Exam  Nursing note and vitals reviewed. Constitutional: He is oriented to person, place, and time. He appears well-developed and well-nourished.  HENT:  Head: Normocephalic and atraumatic.       TMs are normal bilaterally, left lower dental caries noted, no dental abcess, no trismus   Eyes: Conjunctivae and EOM are normal. Pupils are equal, round, and reactive to light.  Neck: Normal range of motion and phonation normal. Neck supple.       mild submandibular adenopathy  Cardiovascular: Normal rate, regular rhythm, normal heart sounds and intact distal pulses.   Pulmonary/Chest: Effort normal and breath sounds normal. He exhibits no bony tenderness.  Abdominal: Soft. Normal appearance. There is no tenderness.  Musculoskeletal: Normal range of motion.  Neurological: He is alert and oriented to person, place, and time. He has normal strength. No cranial nerve deficit or sensory deficit. He exhibits normal muscle tone. Coordination normal.  Skin: Skin is warm, dry and intact.  Psychiatric: He has a normal mood and affect. His behavior is normal. Judgment and thought content normal.    ED Course  Procedures (including critical care  time)  DIAGNOSTIC STUDIES: Oxygen Saturation is 100% on room air, normal by my interpretation.    COORDINATION OF CARE: 2:05PM-Informed pt that there is nothing more that I can do to treat his symptoms. Advised pt to continue to take prescribed medications and follow up with dentist in 6 days.       1. Pain, dental       MDM  Recurrent pain complaints with abnormal dental exam. Doubt serious dental process; including  Abscess, cellulitis, pulpitis. Pt has been on ABX only 1 day, and will need to be on it longer before consideration of a change in treatment plan.  Plan: Home Medications- same; Home Treatments- simple diet; Recommended follow up- Dentristry as scheduled  I personally performed the services described in this documentation, which was scribed in my presence. The recorded information has been reviewed and considered.        Flint Melter, MD 02/17/12 [phone]

## 2012-02-17 NOTE — ED Notes (Signed)
Pt c/o left lower dental pain since Tuesday. Pt seen in ED yesterday but states that his pain medication is not helping.

## 2012-02-17 NOTE — ED Notes (Signed)
Pt upset because "stronger" pain med was not given. Pt offered to throw away hydrocodone that he has with him if the physician would write a different prescription. Pt stated he wanted to give a formal complaint. AC notified and is currently speaking with pt.

## 2012-02-23 ENCOUNTER — Encounter (HOSPITAL_COMMUNITY): Payer: Self-pay | Admitting: *Deleted

## 2012-02-23 ENCOUNTER — Emergency Department (HOSPITAL_COMMUNITY)
Admission: EM | Admit: 2012-02-23 | Discharge: 2012-02-23 | Disposition: A | Discharge status: Home / Self Care | Payer: Self-pay | Attending: Emergency Medicine | Admitting: Emergency Medicine

## 2012-02-23 DIAGNOSIS — F17200 Nicotine dependence, unspecified, uncomplicated: Secondary | ICD-10-CM | POA: Insufficient documentation

## 2012-02-23 DIAGNOSIS — Z823 Family history of stroke: Secondary | ICD-10-CM | POA: Insufficient documentation

## 2012-02-23 DIAGNOSIS — S335XXA Sprain of ligaments of lumbar spine, initial encounter: Secondary | ICD-10-CM | POA: Insufficient documentation

## 2012-02-23 DIAGNOSIS — X500XXA Overexertion from strenuous movement or load, initial encounter: Secondary | ICD-10-CM | POA: Insufficient documentation

## 2012-02-23 DIAGNOSIS — F259 Schizoaffective disorder, unspecified: Secondary | ICD-10-CM | POA: Insufficient documentation

## 2012-02-23 DIAGNOSIS — F319 Bipolar disorder, unspecified: Secondary | ICD-10-CM | POA: Insufficient documentation

## 2012-02-23 DIAGNOSIS — S39012A Strain of muscle, fascia and tendon of lower back, initial encounter: Secondary | ICD-10-CM

## 2012-02-23 DIAGNOSIS — Z8249 Family history of ischemic heart disease and other diseases of the circulatory system: Secondary | ICD-10-CM | POA: Insufficient documentation

## 2012-02-23 MED ORDER — HYDROCODONE-ACETAMINOPHEN 5-325 MG PO TABS: ORAL_TABLET | ORAL | Status: DC

## 2012-02-23 MED ORDER — BACLOFEN 10 MG PO TABS: 10.0000 mg | ORAL_TABLET | Freq: Three times a day (TID) | ORAL | Status: AC

## 2012-02-23 NOTE — ED Notes (Signed)
Pain low back after picking up rocks at work today

## 2012-02-23 NOTE — ED Provider Notes (Signed)
History     CSN: [id number]  Arrival date & time 02/23/12  1714   First MD Initiated Contact with Patient 02/23/12 1805      Chief Complaint  Patient presents with  . Back Pain    (Consider location/radiation/quality/duration/timing/severity/associated sxs/prior treatment) Patient is a 31 y.o. male presenting with back pain. The history is provided by the patient.  Back Pain  This is a new problem. The problem has not changed since onset.The pain is associated with lifting heavy objects. The pain is present in the thoracic spine and lumbar spine. The quality of the pain is described as aching. The pain does not radiate. The pain is moderate. The symptoms are aggravated by bending, twisting and certain positions. The pain is the same all the time. Pertinent negatives include no chest pain, no fever, no abdominal pain, no bowel incontinence, no perianal numbness, no bladder incontinence, no dysuria and no paresthesias. He has tried nothing for the symptoms.    Past Medical History  Diagnosis Date  . Bipolar 1 disorder   . Schizo affective schizophrenia     History reviewed. No pertinent past surgical history.  Family History  Problem Relation Age of Onset  . Stroke Father   . Hypertension Father     History  Substance Use Topics  . Smoking status: Current Everyday Smoker -- 1.0 packs/day for 15 years    Types: Cigarettes  . Smokeless tobacco: Never Used  . Alcohol Use: No      Review of Systems  Constitutional: Negative for fever and activity change.       All ROS Neg except as noted in HPI  HENT: Negative for nosebleeds and neck pain.   Eyes: Negative for photophobia and discharge.  Respiratory: Negative for cough, shortness of breath and wheezing.   Cardiovascular: Negative for chest pain and palpitations.  Gastrointestinal: Negative for abdominal pain, blood in stool and bowel incontinence.  Genitourinary: Negative for bladder incontinence, dysuria, frequency and  hematuria.  Musculoskeletal: Positive for back pain. Negative for arthralgias.  Skin: Negative.   Neurological: Negative for dizziness, seizures, speech difficulty and paresthesias.  Psychiatric/Behavioral: Negative for hallucinations and confusion.    Allergies  Review of patient's allergies indicates no known allergies.  Home Medications   Current Outpatient Rx  Name Route Sig Dispense Refill  . CARBAMAZEPINE 200 MG PO TABS Oral Take 400 mg by mouth at bedtime.    . IBUPROFEN 200 MG PO TABS Oral Take 600 mg by mouth 2 (two) times daily as needed. For pain    . HYDROCODONE-ACETAMINOPHEN 5-325 MG PO TABS  1 or 2 tabs PO q6 hours prn pain 20 tablet 0  . NAPROXEN 250 MG PO TABS Oral Take 1 tablet (250 mg total) by mouth 2 (two) times daily with a meal. 14 tablet 0  . PENICILLIN V POTASSIUM 250 MG PO TABS Oral Take 1 tablet (250 mg total) by mouth 4 (four) times daily. 20 tablet 0    BP 143/90  Pulse 104  Temp 98 F (36.7 C) (Oral)  Resp 20  SpO2 99%  Physical Exam  Nursing note and vitals reviewed. Constitutional: He is oriented to person, place, and time. He appears well-developed and well-nourished.  Non-toxic appearance.  HENT:  Head: Normocephalic.  Right Ear: Tympanic membrane and external ear normal.  Left Ear: Tympanic membrane and external ear normal.  Eyes: EOM and lids are normal. Pupils are equal, round, and reactive to light.  Neck: Normal range of motion.  Neck supple. Carotid bruit is not present.  Cardiovascular: Normal rate, regular rhythm, normal heart sounds, intact distal pulses and normal pulses.   Pulmonary/Chest: Breath sounds normal. No respiratory distress.  Abdominal: Soft. Bowel sounds are normal. There is no tenderness. There is no guarding.  Musculoskeletal: Normal range of motion.       There is paraspinal tightness and tenderness on the right greater than on the left at the lumbar area and the lower thoracic area.  Lymphadenopathy:       Head  (right side): No submandibular adenopathy present.       Head (left side): No submandibular adenopathy present.    He has no cervical adenopathy.  Neurological: He is alert and oriented to person, place, and time. He has normal strength. No cranial nerve deficit or sensory deficit. He exhibits normal muscle tone. Coordination normal.  Skin: Skin is warm and dry.  Psychiatric: He has a normal mood and affect. His speech is normal.    ED Course  Procedures (including critical care time)  Labs Reviewed - No data to display No results found.   No diagnosis found.    MDM  I have reviewed nursing notes, vital signs, and all appropriate lab and imaging results for this patient. Examination of the back and flank are consistent with a muscle strain. Patient advised to use an ice pack to the area. He is given a prescription for baclofen and Norco. Patient is to see the orthopedic specialist if not improving.       Kathie Dike, [place] 02/23/12 1905

## 2012-02-23 NOTE — ED Notes (Signed)
H. Bryant, PA at bedside. 

## 2012-02-25 NOTE — ED Provider Notes (Signed)
Medical screening examination/treatment/procedure(s) were performed by non-physician practitioner and as supervising physician I was immediately available for consultation/collaboration.   Shelda Jakes, MD 02/25/12 [phone]

## 2012-04-07 ENCOUNTER — Emergency Department (HOSPITAL_COMMUNITY)
Admission: EM | Admit: 2012-04-07 | Discharge: 2012-04-07 | Disposition: A | Discharge status: Home / Self Care | Payer: Self-pay | Attending: Emergency Medicine | Admitting: Emergency Medicine

## 2012-04-07 ENCOUNTER — Encounter (HOSPITAL_COMMUNITY): Payer: Self-pay | Admitting: Emergency Medicine

## 2012-04-07 DIAGNOSIS — K029 Dental caries, unspecified: Secondary | ICD-10-CM | POA: Insufficient documentation

## 2012-04-07 DIAGNOSIS — K0889 Other specified disorders of teeth and supporting structures: Secondary | ICD-10-CM

## 2012-04-07 DIAGNOSIS — F259 Schizoaffective disorder, unspecified: Secondary | ICD-10-CM | POA: Insufficient documentation

## 2012-04-07 DIAGNOSIS — F319 Bipolar disorder, unspecified: Secondary | ICD-10-CM | POA: Insufficient documentation

## 2012-04-07 MED ORDER — ACETAMINOPHEN-CODEINE #3 300-30 MG PO TABS: 1.0000 | ORAL_TABLET | Freq: Two times a day (BID) | ORAL | Status: DC

## 2012-04-07 MED ORDER — PENICILLIN V POTASSIUM 500 MG PO TABS: 500.0000 mg | ORAL_TABLET | Freq: Three times a day (TID) | ORAL | Status: DC

## 2012-04-07 NOTE — ED Provider Notes (Signed)
History     CSN: [id number]  Arrival date & time 04/07/12  1439   First MD Initiated Contact with Patient 04/07/12 1515      Chief Complaint  Patient presents with  . Dental Pain    lower back dental pain   HPI  History provided by the patient. Patient is a 31 year old male who presents with complaints of left lower dental pains. Patient reports having pain began last Thursday and through this weekend. Pain has been increasing. He has been using ibuprofen 800s without significant relief. Patient admits to poor dentition and significant dental problems in the past. He has a denture to the upper teeth. He denies having any swelling of the gums or under the tongue. No fever, chills or sweats. Pain is sharp radiates into the lower job. He denies any aggravating or alleviating factors. Patient does report having an appointment with his oral surgeon this coming Friday.    Past Medical History  Diagnosis Date  . Bipolar 1 disorder   . Schizo affective schizophrenia     History reviewed. No pertinent past surgical history.  Family History  Problem Relation Age of Onset  . Stroke Father   . Hypertension Father     History  Substance Use Topics  . Smoking status: Current Every Day Smoker -- 1.0 packs/day for 15 years    Types: Cigarettes  . Smokeless tobacco: Never Used  . Alcohol Use: No      Review of Systems  Constitutional: Negative for fever, chills and diaphoresis.  HENT: Positive for dental problem. Negative for sore throat, drooling and neck pain.   Gastrointestinal: Negative for nausea and vomiting.    Allergies  Review of patient's allergies indicates no known allergies.  Home Medications   Current Outpatient Rx  Name Route Sig Dispense Refill  . IBUPROFEN 800 MG PO TABS Oral Take 800 mg by mouth every 6 (six) hours as needed. For pain.    Marland Kitchen CARBAMAZEPINE 200 MG PO TABS Oral Take 400 mg by mouth at bedtime.      BP 141/84  Pulse 89  Temp 99.5 F (37.5 C)  (Oral)  Resp 20  Wt 170 lb (77.111 kg)  SpO2 99%  Physical Exam  Nursing note and vitals reviewed. Constitutional: He appears well-developed and well-nourished.  HENT:  Head: Normocephalic.       Patient with upper dentures. There are only in the frontal incisors and canine teeth remaining in the lower mouth. There is significant dental caries to all of these teeth. There are also some remnants of the molar teeth on bilateral sides decay to the gumline. No significant swelling of the gums. No fluctuance or drainable abscess. No swelling under the tongue or signs for concerning for Ludwig angina.  Cardiovascular: Normal rate and regular rhythm.   Pulmonary/Chest: Effort normal and breath sounds normal.  Abdominal: Soft.    ED Course  Procedures     1. Pain, dental       MDM  3:55PM patient seen and evaluated. Patient in no acute distress. Patient declined dental block.   Patient has several prescriptions for narcotics filled at multiple locations and provided by multiple physicians over the past several months. Patient reports having an appointment with an oral surgeon this coming Friday. At this time I would give him a limited prescription for Tylenol 3.        Angus Seller, [place] 04/08/12 [phone]

## 2012-04-07 NOTE — ED Notes (Signed)
Increased dental pain x 5 days

## 2012-04-09 NOTE — ED Provider Notes (Signed)
Medical screening examination/treatment/procedure(s) were performed by non-physician practitioner and as supervising physician I was immediately available for consultation/collaboration.  Arianne Klinge, MD 04/09/12 2357 

## 2012-09-11 ENCOUNTER — Encounter (HOSPITAL_COMMUNITY): Payer: Self-pay

## 2012-09-11 ENCOUNTER — Emergency Department (HOSPITAL_COMMUNITY)
Admission: EM | Admit: 2012-09-11 | Discharge: 2012-09-11 | Disposition: A | Discharge status: Home / Self Care | Payer: Self-pay | Attending: Emergency Medicine | Admitting: Emergency Medicine

## 2012-09-11 ENCOUNTER — Emergency Department (HOSPITAL_COMMUNITY): Payer: Self-pay

## 2012-09-11 DIAGNOSIS — R42 Dizziness and giddiness: Secondary | ICD-10-CM | POA: Insufficient documentation

## 2012-09-11 DIAGNOSIS — F259 Schizoaffective disorder, unspecified: Secondary | ICD-10-CM | POA: Insufficient documentation

## 2012-09-11 DIAGNOSIS — R079 Chest pain, unspecified: Secondary | ICD-10-CM | POA: Insufficient documentation

## 2012-09-11 DIAGNOSIS — F17200 Nicotine dependence, unspecified, uncomplicated: Secondary | ICD-10-CM | POA: Insufficient documentation

## 2012-09-11 DIAGNOSIS — F319 Bipolar disorder, unspecified: Secondary | ICD-10-CM | POA: Insufficient documentation

## 2012-09-11 DIAGNOSIS — R300 Dysuria: Secondary | ICD-10-CM | POA: Insufficient documentation

## 2012-09-11 DIAGNOSIS — R110 Nausea: Secondary | ICD-10-CM | POA: Insufficient documentation

## 2012-09-11 DIAGNOSIS — R109 Unspecified abdominal pain: Secondary | ICD-10-CM | POA: Insufficient documentation

## 2012-09-11 DIAGNOSIS — N452 Orchitis: Secondary | ICD-10-CM

## 2012-09-11 DIAGNOSIS — N453 Epididymo-orchitis: Secondary | ICD-10-CM | POA: Insufficient documentation

## 2012-09-11 DIAGNOSIS — Z79899 Other long term (current) drug therapy: Secondary | ICD-10-CM | POA: Insufficient documentation

## 2012-09-11 LAB — URINALYSIS, ROUTINE W REFLEX MICROSCOPIC
Bilirubin Urine: NEGATIVE
Glucose, UA: NEGATIVE mg/dL
Ketones, ur: NEGATIVE mg/dL
Leukocytes, UA: NEGATIVE
pH: 6 (ref 5.0–8.0)

## 2012-09-11 LAB — CBC WITH DIFFERENTIAL/PLATELET
Eosinophils Relative: 1 % (ref 0–5)
Lymphocytes Relative: 31 % (ref 12–46)
Lymphs Abs: 2.9 10*3/uL (ref 0.7–4.0)
MCV: 93.2 fL (ref 78.0–100.0)
Platelets: 163 10*3/uL (ref 150–400)
RBC: 4.55 MIL/uL (ref 4.22–5.81)
WBC: 9.4 10*3/uL (ref 4.0–10.5)

## 2012-09-11 MED ORDER — DOXYCYCLINE HYCLATE 100 MG PO CAPS: 100.0000 mg | ORAL_CAPSULE | Freq: Two times a day (BID) | ORAL | Status: DC

## 2012-09-11 MED ORDER — CEFTRIAXONE SODIUM 250 MG IJ SOLR
250.0000 mg | Freq: Once | INTRAMUSCULAR | Status: AC
  Administered 2012-09-11: 250 mg via INTRAMUSCULAR
  Filled 2012-09-11: qty 250

## 2012-09-11 MED ORDER — LIDOCAINE HCL (PF) 1 % IJ SOLN
INTRAMUSCULAR | Status: AC
  Administered 2012-09-11: 23:00:00
  Filled 2012-09-11: qty 5

## 2012-09-11 NOTE — ED Notes (Signed)
Radiology called to have ultrasound called in.

## 2012-09-11 NOTE — ED Provider Notes (Signed)
History     CSN: [id number]  Arrival date & time 09/11/12  1826   First MD Initiated Contact with Patient 09/11/12 1854      Chief Complaint  Patient presents with  . r rib pain   . Testicle Pain    (Consider location/radiation/quality/duration/timing/severity/associated sxs/prior treatment) Patient is a 32 y.o. male presenting with testicular pain. The history is provided by the patient.  Testicle Pain This is a new problem. The current episode started in the past 7 days. The problem occurs intermittently. The problem has been unchanged. Associated symptoms include abdominal pain and nausea. Pertinent negatives include no anorexia, chills, fever, headaches, rash or vomiting. Associated symptoms comments: No problems with urination. The symptoms are aggravated by standing, twisting and walking. He has tried acetaminophen and NSAIDs for the symptoms. The treatment provided no relief.    Past Medical History  Diagnosis Date  . Bipolar 1 disorder   . Schizo affective schizophrenia     History reviewed. No pertinent past surgical history.  Family History  Problem Relation Age of Onset  . Stroke Father   . Hypertension Father     History  Substance Use Topics  . Smoking status: Current Every Day Smoker -- 1.00 packs/day for 15 years    Types: Cigarettes  . Smokeless tobacco: Never Used  . Alcohol Use: No      Review of Systems  Constitutional: Negative for fever and chills.  Gastrointestinal: Positive for nausea and abdominal pain. Negative for vomiting and anorexia.  Genitourinary: Positive for dysuria, flank pain and testicular pain. Negative for urgency and frequency.  Skin: Negative for rash.  Neurological: Positive for light-headedness. Negative for headaches.    Allergies  Review of patient's allergies indicates no known allergies.  Home Medications   Current Outpatient Rx  Name  Route  Sig  Dispense  Refill  . citalopram (CELEXA) 20 MG tablet   Oral    Take 20 mg by mouth daily.         . traZODone (DESYREL) 50 MG tablet   Oral   Take 50 mg by mouth at bedtime.           BP 125/94  Pulse 88  Temp(Src) 98.5 F (36.9 C) (Oral)  Resp 18  Ht 6\' 1"  (1.854 m)  Wt 170 lb (77.111 kg)  BMI 22.43 kg/m2  SpO2 98%  Physical Exam  Nursing note and vitals reviewed. Constitutional: He appears well-developed and well-nourished. No distress.  HENT:  Head: Normocephalic and atraumatic.  Eyes: EOM are normal.  Neck: Normal range of motion. Neck supple.  Cardiovascular: Normal rate and regular rhythm.   Pulmonary/Chest: Effort normal and breath sounds normal.  Abdominal: Soft. Normal appearance and bowel sounds are normal. There is no tenderness. There is no CVA tenderness. Hernia confirmed negative in the right inguinal area and confirmed negative in the left inguinal area.  Unable to reproduce the pain the patient has had under his right ribs.  Genitourinary: Rectum normal, testes normal and penis normal. Rectal exam shows no mass and no tenderness. Prostate is not enlarged and not tender. Circumcised. No penile erythema or penile tenderness. No discharge found.  Psychiatric: He has a normal mood and affect. His behavior is normal. Judgment and thought content normal.    ED Course  Procedures (including critical care time) US Scrotum  09/11/2012  *RADIOLOGY REPORT*  Clinical Data:  Right testicular pain for 2 weeks  SCROTAL ULTRASOUND DOPPLER ULTRASOUND OF THE TESTICLES  Technique:  Complete ultrasound examination of the testicles, epididymis, and other scrotal structures was performed.  Color and spectral Doppler ultrasound were also utilized to evaluate blood flow to the testicles.  Comparison:  None  Findings:  Right testis:  4.3 x 2.6 x 3.0 cm.  Normal echogenicity without mass or calcification.  Internal blood flow present on color Doppler imaging, minimally hypervascular versus left testis.  Left testis:  4.3 x 2.5 x 3.8 cm.  Normal  echogenicity without mass or calcification.  Internal blood flow present on color Doppler imaging.  Right epididymis:  Normal in size and appearance.  Left epididymis:  Normal size with a cyst at the epididymal head 2.8 x 1.9 x 2.3 cm.  Hydrocele:  Small bilateral hydroceles present  Varicocele:  Absent bilaterally  Pulsed Doppler interrogation of both testes demonstrates intratesticular venous and low resistance arterial flow bilaterally.  No hernia seen.  IMPRESSION: No evidence of testicular mass or torsion. Small bilateral hydroceles. Large cyst at head of left epididymis question spermatocele or epididymal cyst. Mild hyperemia of the right testis versus left on color Doppler imaging raising question of right orchitis.   Original Report Authenticated By: Ulyses Southward, M.D.    [place] Art/ven Flow Abd Pelv Doppler  09/11/2012  *RADIOLOGY REPORT*  Clinical Data:  Right testicular pain for 2 weeks  SCROTAL ULTRASOUND DOPPLER ULTRASOUND OF THE TESTICLES  Technique: Complete ultrasound examination of the testicles, epididymis, and other scrotal structures was performed.  Color and spectral Doppler ultrasound were also utilized to evaluate blood flow to the testicles.  Comparison:  None  Findings:  Right testis:  4.3 x 2.6 x 3.0 cm.  Normal echogenicity without mass or calcification.  Internal blood flow present on color Doppler imaging, minimally hypervascular versus left testis.  Left testis:  4.3 x 2.5 x 3.8 cm.  Normal echogenicity without mass or calcification.  Internal blood flow present on color Doppler imaging.  Right epididymis:  Normal in size and appearance.  Left epididymis:  Normal size with a cyst at the epididymal head 2.8 x 1.9 x 2.3 cm.  Hydrocele:  Small bilateral hydroceles present  Varicocele:  Absent bilaterally  Pulsed Doppler interrogation of both testes demonstrates intratesticular venous and low resistance arterial flow bilaterally.  No hernia seen.  IMPRESSION: No evidence of testicular mass or  torsion. Small bilateral hydroceles. Large cyst at head of left epididymis question spermatocele or epididymal cyst. Mild hyperemia of the right testis versus left on color Doppler imaging raising question of right orchitis.   Original Report Authenticated By: Ulyses Southward, M.D.     Assessment: Cyst left epididymis   Right orchitis  Plan:  Rocephin 250 mg IM   Rx Doxycycline   Follow up with Urology  MDM  32 y.o. male with intermittent pain in right testicle x 1 week. After examination and ultrasound results reviewed I will treat the patient for orchitis. He has not pain at this time. I have discussed the findings of the ultrasound and need for follow up with the Urologist. Patient voices understanding.  I have reviewed this patient's vital signs, nurses notes, appropriate labs and imaging.  I have discussed findings with the patient.    Medication List    TAKE these medications       doxycycline 100 MG capsule  Commonly known as:  VIBRAMYCIN  Take 1 capsule (100 mg total) by mouth 2 (two) times daily.      ASK your doctor about these medications  citalopram 20 MG tablet  Commonly known as:  CELEXA  Take 20 mg by mouth daily.     traZODone 50 MG tablet  Commonly known as:  DESYREL  Take 50 mg by mouth at bedtime.               [facility] Orlene Och, [place] 09/13/12 [phone]

## 2012-09-11 NOTE — ED Notes (Signed)
Pt states right rib pain on & off for 2 weeks. Denies any injury. States pain radiates to his testicles. Denies any injury, does have a cough but that is normal for him. Denies any swelling in scrotum.

## 2012-09-11 NOTE — ED Notes (Signed)
Pt c/o pain in R ribs that comes and goes and also has pain in r testicle at the same time.  Reports pain is intermittent and doesn't hurt right now.  Reports is nauseated when the pain comes but no vomiting.

## 2012-09-11 NOTE — ED Notes (Signed)
Pt alert & oriented x4, stable gait. Patient given discharge instructions, paperwork & prescription(s). Patient  instructed to stop at the registration desk to finish any additional paperwork. Patient verbalized understanding. Pt left department w/ no further questions. 

## 2012-09-14 NOTE — ED Provider Notes (Signed)
Medical screening examination/treatment/procedure(s) were performed by non-physician practitioner and as supervising physician I was immediately available for consultation/collaboration. Devoria Albe, MD, Armando Gang   Ward Givens, MD 09/14/12 2002

## 2012-09-17 ENCOUNTER — Encounter (HOSPITAL_COMMUNITY): Payer: Self-pay

## 2012-09-17 ENCOUNTER — Emergency Department (HOSPITAL_COMMUNITY)
Admission: EM | Admit: 2012-09-17 | Discharge: 2012-09-17 | Disposition: A | Discharge status: Home / Self Care | Payer: Self-pay | Attending: Emergency Medicine | Admitting: Emergency Medicine

## 2012-09-17 DIAGNOSIS — F17200 Nicotine dependence, unspecified, uncomplicated: Secondary | ICD-10-CM | POA: Insufficient documentation

## 2012-09-17 DIAGNOSIS — Z79899 Other long term (current) drug therapy: Secondary | ICD-10-CM | POA: Insufficient documentation

## 2012-09-17 DIAGNOSIS — R350 Frequency of micturition: Secondary | ICD-10-CM | POA: Insufficient documentation

## 2012-09-17 DIAGNOSIS — F259 Schizoaffective disorder, unspecified: Secondary | ICD-10-CM | POA: Insufficient documentation

## 2012-09-17 DIAGNOSIS — N453 Epididymo-orchitis: Secondary | ICD-10-CM | POA: Insufficient documentation

## 2012-09-17 DIAGNOSIS — N452 Orchitis: Secondary | ICD-10-CM

## 2012-09-17 DIAGNOSIS — F319 Bipolar disorder, unspecified: Secondary | ICD-10-CM | POA: Insufficient documentation

## 2012-09-17 MED ORDER — ONDANSETRON 8 MG PO TBDP
8.0000 mg | ORAL_TABLET | Freq: Once | ORAL | Status: AC
  Administered 2012-09-17: 8 mg via ORAL
  Filled 2012-09-17: qty 1

## 2012-09-17 MED ORDER — CIPROFLOXACIN HCL 500 MG PO TABS: 500.0000 mg | ORAL_TABLET | Freq: Two times a day (BID) | ORAL | Status: DC

## 2012-09-17 MED ORDER — AZITHROMYCIN 250 MG PO TABS
1000.0000 mg | ORAL_TABLET | Freq: Once | ORAL | Status: AC
  Administered 2012-09-17: 1000 mg via ORAL
  Filled 2012-09-17: qty 4

## 2012-09-17 NOTE — ED Notes (Signed)
Pt reports was seen here recently and diagnosed with orchitis.  Reports was given prescription for antibiotics but was not able to afford them.  Pt says pain worse.  Denies any redness or swelling.

## 2012-09-17 NOTE — ED Notes (Addendum)
States that he can not afford the antibiotics that are prescribed to him and needs something different.  States that he needs something for pain as well.  States that he was seen here at least 5 days ago.  No acute distress noted.

## 2012-09-17 NOTE — ED Provider Notes (Signed)
History  This chart was scribed for Ward Givens, MD by Bennett Scrape, ED Scribe. This patient was seen in room APA14/APA14 and the patient's care was started at 2:03 PM.  CSN: [id number]  Arrival date & time 09/17/12  1231   First MD Initiated Contact with Patient 09/17/12 1403      Chief Complaint  Patient presents with  . Testicle Pain     Patient is a 32 y.o. male presenting with testicular pain. The history is provided by the patient. No language interpreter was used.  Testicle Pain This is a new problem. The current episode started more than 1 week ago. The problem occurs constantly. The problem has been gradually worsening.    Nicholas Nielsen is a 32 y.o. male who presents to the Emergency Department complaining of 2 weeks of right testicular pain with associated frequency. He has seen in the ED on 09/11/12 for the same and was diagnosed with orchitis. He was given an injection of rocephin prior to discharge and  prescribed doxycycline but did not get his prescription filled due to cost.He denies prior use of the same antibiotic. He denies dysuria, penile discharge or penile pain as associated symptoms. He denies having any new sexual partners. He states his pain isn't worse, just not improving.   No current PCP.   Past Medical History  Diagnosis Date  . Bipolar 1 disorder   . Schizo affective schizophrenia     History reviewed. No pertinent past surgical history.  Family History  Problem Relation Age of Onset  . Stroke Father   . Hypertension Father     History  Substance Use Topics  . Smoking status: Current Every Day Smoker -- 1.00 packs/day for 15 years    Types: Cigarettes  . Smokeless tobacco: Never Used  . Alcohol Use: No      Review of Systems  Gastrointestinal: Negative for nausea and vomiting.  Genitourinary: Positive for frequency and testicular pain. Negative for dysuria.  All other systems reviewed and are negative.    Allergies  Review of  patient's allergies indicates no known allergies.  Home Medications   Current Outpatient Rx  Name  Route  Sig  Dispense  Refill  . acetaminophen (TYLENOL) 500 MG tablet   Oral   Take 1,000 mg by mouth every 6 (six) hours as needed for pain.         . citalopram (CELEXA) 20 MG tablet   Oral   Take 20 mg by mouth daily.         Marland Kitchen ibuprofen (ADVIL,MOTRIN) 200 MG tablet   Oral   Take 800 mg by mouth every 6 (six) hours as needed for pain.         . traZODone (DESYREL) 50 MG tablet   Oral   Take 50 mg by mouth at bedtime.         Marland Kitchen doxycycline (VIBRAMYCIN) 100 MG capsule   Oral   Take 1 capsule (100 mg total) by mouth 2 (two) times daily.   20 capsule   0     Triage Vitals: BP 130/73  Pulse 67  Temp(Src) 97.6 F (36.4 C) (Oral)  Resp 20  Ht 6\' 1"  (1.854 m)  Wt 175 lb (79.379 kg)  BMI 23.09 kg/m2  SpO2 100%  Vital signs normal    Physical Exam  Nursing note and vitals reviewed. Constitutional: He is oriented to person, place, and time. He appears well-developed and well-nourished.  Non-toxic appearance. He  does not appear ill. No distress.  HENT:  Head: Normocephalic and atraumatic.  Right Ear: External ear normal.  Left Ear: External ear normal.  Nose: Nose normal. No mucosal edema or rhinorrhea.  Mouth/Throat: Mucous membranes are normal. No dental abscesses or edematous.  Eyes: Conjunctivae and EOM are normal. Pupils are equal, round, and reactive to light.  Neck: Normal range of motion and full passive range of motion without pain. Neck supple.  Pulmonary/Chest: Effort normal and breath sounds normal. No respiratory distress. He has no rhonchi. He exhibits no crepitus.  Abdominal: Soft. Normal appearance and bowel sounds are normal. He exhibits no distension. There is no tenderness. There is no rebound and no guarding.  Genitourinary:  Normal external genitalia left testicle is nontender to palpation and is normal size. He has mild tenderness to  palpation of the right testicle which does not feel enlarged. There's no masses felt. He has mild discomfort in the epididymal area on the right.  Musculoskeletal: Normal range of motion. He exhibits no edema and no tenderness.  Moves all extremities well.   Neurological: He is alert and oriented to person, place, and time. He has normal strength. No cranial nerve deficit.  Skin: Skin is warm, dry and intact. No rash noted. No erythema. No pallor.  Psychiatric: He has a normal mood and affect. His speech is normal and behavior is normal. His mood appears not anxious.    ED Course  Procedures (including critical care time)  Medications  azithromycin (ZITHROMAX) tablet 1,000 mg (1,000 mg Oral Given 09/17/12 1536)  ondansetron (ZOFRAN-ODT) disintegrating tablet 8 mg (8 mg Oral Given 09/17/12 1536)     DIAGNOSTIC STUDIES: Oxygen Saturation is 100% on room air, normal by my interpretation.    COORDINATION OF CARE: 3:19 PM-Discussed discharge plan which includes antibiotics with pt and pt agreed to plan.  09/11/2012 Clinical Data: Right testicular pain for 2 weeks  SCROTAL ULTRASOUND  DOPPLER ULTRASOUND OF THE TESTICLES  IMPRESSION:  No evidence of testicular mass or torsion.  Small bilateral hydroceles.  Large cyst at head of left epididymis question spermatocele or  epididymal cyst.  Mild hyperemia of the right testis versus left on color Doppler  imaging raising question of right orchitis.  Original Report Authenticated By: Ulyses Southward, M.D.     1. Orchitis    Discharge Medication List as of 09/17/2012  4:18 PM    START taking these medications   Details  ciprofloxacin (CIPRO) 500 MG tablet Take 1 tablet (500 mg total) by mouth 2 (two) times daily., Starting 09/17/2012, Until Discontinued, Print        Plan discharge  Devoria Albe, MD, FACEP    MDM   I personally performed the services described in this documentation, which was scribed in my presence. The recorded  information has been reviewed and considered.  Devoria Albe, MD, Armando Gang   Ward Givens, MD 09/17/12 [phone]

## 2012-10-11 ENCOUNTER — Emergency Department (HOSPITAL_COMMUNITY)
Admission: EM | Admit: 2012-10-11 | Discharge: 2012-10-11 | Disposition: A | Discharge status: Home / Self Care | Payer: Self-pay | Attending: Emergency Medicine | Admitting: Emergency Medicine

## 2012-10-11 ENCOUNTER — Encounter (HOSPITAL_COMMUNITY): Payer: Self-pay | Admitting: Emergency Medicine

## 2012-10-11 ENCOUNTER — Encounter (HOSPITAL_COMMUNITY): Payer: Self-pay | Admitting: *Deleted

## 2012-10-11 DIAGNOSIS — Z87898 Personal history of other specified conditions: Secondary | ICD-10-CM | POA: Insufficient documentation

## 2012-10-11 DIAGNOSIS — F17200 Nicotine dependence, unspecified, uncomplicated: Secondary | ICD-10-CM | POA: Insufficient documentation

## 2012-10-11 DIAGNOSIS — F309 Manic episode, unspecified: Secondary | ICD-10-CM | POA: Insufficient documentation

## 2012-10-11 DIAGNOSIS — K0889 Other specified disorders of teeth and supporting structures: Secondary | ICD-10-CM

## 2012-10-11 DIAGNOSIS — Z79899 Other long term (current) drug therapy: Secondary | ICD-10-CM | POA: Insufficient documentation

## 2012-10-11 DIAGNOSIS — F259 Schizoaffective disorder, unspecified: Secondary | ICD-10-CM | POA: Insufficient documentation

## 2012-10-11 DIAGNOSIS — K029 Dental caries, unspecified: Secondary | ICD-10-CM | POA: Insufficient documentation

## 2012-10-11 DIAGNOSIS — F319 Bipolar disorder, unspecified: Secondary | ICD-10-CM | POA: Insufficient documentation

## 2012-10-11 DIAGNOSIS — K089 Disorder of teeth and supporting structures, unspecified: Secondary | ICD-10-CM | POA: Insufficient documentation

## 2012-10-11 HISTORY — DX: Benign cyst of testis: N44.2

## 2012-10-11 MED ORDER — TRAMADOL HCL 50 MG PO TABS: 50.0000 mg | ORAL_TABLET | Freq: Four times a day (QID) | ORAL | Status: DC | PRN

## 2012-10-11 MED ORDER — AMOXICILLIN 500 MG PO CAPS: 500.0000 mg | ORAL_CAPSULE | Freq: Three times a day (TID) | ORAL | Status: DC

## 2012-10-11 MED ORDER — OXYCODONE-ACETAMINOPHEN 5-325 MG PO TABS
1.0000 | ORAL_TABLET | Freq: Once | ORAL | Status: AC
  Administered 2012-10-11: 1 via ORAL
  Filled 2012-10-11: qty 1

## 2012-10-11 MED ORDER — PENICILLIN V POTASSIUM 500 MG PO TABS: 500.0000 mg | ORAL_TABLET | Freq: Four times a day (QID) | ORAL | Status: AC

## 2012-10-11 NOTE — ED Provider Notes (Signed)
History     This chart was scribed for Joya Gaskins, MD, MD by Smitty Pluck, ED Scribe. The patient was seen in room APA08/APA08 and the patient's care was started at 10:00 AM.   CSN: [id number]  Arrival date & time 10/11/12  7829    Chief Complaint  Patient presents with  . Dental Pain     Patient is a 32 y.o. male presenting with tooth pain.  Dental PainThe primary symptoms include mouth pain. Primary symptoms do not include fever. The symptoms began 12 to 24 hours ago. The symptoms are unchanged. The symptoms are recurrent. The symptoms occur intermittently.  Additional symptoms do not include: trouble swallowing.   Nicholas Nielsen is a 32 y.o. male with dentures who presents to the Emergency Department complaining of intermittent dental pain that began yesterday.  Pt denies fever, chills, nausea, vomiting, diarrhea, weakness, cough, SOB and any other pain.  No primary PCP.   Past Medical History  Diagnosis Date  . Bipolar 1 disorder   . Schizo affective schizophrenia     History reviewed. No pertinent past surgical history.  Family History  Problem Relation Age of Onset  . Stroke Father   . Hypertension Father     History  Substance Use Topics  . Smoking status: Current Every Day Smoker -- 1.00 packs/day for 15 years    Types: Cigarettes  . Smokeless tobacco: Never Used  . Alcohol Use: No      Review of Systems  Constitutional: Negative for fever and chills.  HENT: Positive for dental problem. Negative for mouth sores and trouble swallowing.     Allergies  Review of patient's allergies indicates no known allergies.  Home Medications   Current Outpatient Rx  Name  Route  Sig  Dispense  Refill  . acetaminophen (TYLENOL) 500 MG tablet   Oral   Take 1,000 mg by mouth every 6 (six) hours as needed for pain.         . ciprofloxacin (CIPRO) 500 MG tablet   Oral   Take 1 tablet (500 mg total) by mouth 2 (two) times daily.   28 tablet   0   .  citalopram (CELEXA) 20 MG tablet   Oral   Take 20 mg by mouth daily.         Marland Kitchen doxycycline (VIBRAMYCIN) 100 MG capsule   Oral   Take 1 capsule (100 mg total) by mouth 2 (two) times daily.   20 capsule   0   . ibuprofen (ADVIL,MOTRIN) 200 MG tablet   Oral   Take 800 mg by mouth every 6 (six) hours as needed for pain.         . traZODone (DESYREL) 50 MG tablet   Oral   Take 50 mg by mouth at bedtime.           BP 117/102  Pulse 94  Temp(Src) 98.5 F (36.9 C) (Oral)  Resp 18  Ht 6\' 1"  (1.854 m)  Wt 185 lb (83.915 kg)  BMI 24.41 kg/m2  SpO2 96%  Physical Exam  Nursing note and vitals reviewed.  CONSTITUTIONAL: Well developed/well nourished HEAD AND FACE: Normocephalic/atraumatic EYES: EOMI/PERRL ENMT: Mucous membranes moist.  Poor dentition.  No trismus.  No focal abscess noted. NECK: supple no meningeal signs CV: S1/S2 noted, no murmurs/rubs/gallops noted LUNGS: Lungs are clear to auscultation bilaterally, no apparent distress ABDOMEN: soft, nontender, no rebound or guarding NEURO: Pt is awake/alert, moves all extremitiesx4 EXTREMITIES:full ROM SKIN: warm,  color normal   ED Course  Procedures   DIAGNOSTIC STUDIES: Oxygen Saturation is 96% on room air, normal by my interpretation.    COORDINATION OF CARE: 10:01 AM Discussed ED treatment which includes pain medication and antibiotics with pt and pt agrees.   Pt given percocet for pain and advised to take amoxicillin and dentistry followup He admitted to chronic dental pain and I advised this is best treated as outpatient Stable for d/c      MDM  Nursing notes including past medical history and social history reviewed and considered in documentation       I personally performed the services described in this documentation, which was scribed in my presence. The recorded information has been reviewed and is accurate.      Joya Gaskins, MD 10/11/12 1050

## 2012-10-11 NOTE — ED Notes (Signed)
Pt up for discharge, requesting rx for pain medication. Was seen this morning but pt left AMA d/t not getting pain rx. MD notified

## 2012-10-11 NOTE — ED Provider Notes (Signed)
History     CSN: [id number]  Arrival date & time 10/11/12  1114   First MD Initiated Contact with Patient 10/11/12 1130      Chief Complaint  Patient presents with  . Dental Pain    (Consider location/radiation/quality/duration/timing/severity/associated sxs/prior treatment) HPI Comments: Pt presenting to the ED for dental pain.  Was seen earlier today at [facility] ED for same.  Complains of diffuse lower dental pain due to poor cavities and broken teeth.  States he has a dentist appointment on 4/18 but the pain is unbearable and he is concerned that he will develop a dental abscess prior to his appt.  Denies any difficulty swallowing, numbness or paresthesias of face, fevers, or chills.  No trismus.  Admits to chronic dental pain from poor dentition.  The history is provided by the patient.    Past Medical History  Diagnosis Date  . Bipolar 1 disorder   . Schizo affective schizophrenia   . Testicular cyst     History reviewed. No pertinent past surgical history.  Family History  Problem Relation Age of Onset  . Stroke Father   . Hypertension Father     History  Substance Use Topics  . Smoking status: Current Every Day Smoker -- 1.00 packs/day for 15 years    Types: Cigarettes  . Smokeless tobacco: Never Used  . Alcohol Use: No      Review of Systems  HENT: Positive for dental problem.   All other systems reviewed and are negative.    Allergies  Review of patient's allergies indicates no known allergies.  Home Medications   Current Outpatient Rx  Name  Route  Sig  Dispense  Refill  . amoxicillin (AMOXIL) 500 MG capsule   Oral   Take 1 capsule (500 mg total) by mouth 3 (three) times daily.   21 capsule   0   . citalopram (CELEXA) 20 MG tablet   Oral   Take 20 mg by mouth daily.         Marland Kitchen ibuprofen (ADVIL,MOTRIN) 200 MG tablet   Oral   Take 800 mg by mouth every 6 (six) hours as needed for pain.         . traZODone (DESYREL) 50 MG tablet   Oral  Take 50 mg by mouth at bedtime.           BP 140/78  Pulse 95  Temp(Src) 97.6 F (36.4 C) (Oral)  Resp 16  SpO2 96%  Physical Exam  Nursing note and vitals reviewed. Constitutional: He is oriented to person, place, and time. He appears well-developed and well-nourished.  HENT:  Head: Normocephalic and atraumatic. No trismus in the jaw.  Mouth/Throat: Uvula is midline, oropharynx is clear and moist and mucous membranes are normal. He has dentures (upper). No oral lesions. Abnormal dentition. Dental caries present. No dental abscesses or edematous. No oropharyngeal exudate, posterior oropharyngeal edema, posterior oropharyngeal erythema or tonsillar abscesses.  Teeth largely in poor dentition, lower pre-molars broken bilaterally; no evidence of abscess, no trismus  Eyes: Conjunctivae and EOM are normal. Pupils are equal, round, and reactive to light.  Neck: Normal range of motion. Neck supple.  Cardiovascular: Normal rate, regular rhythm and normal heart sounds.   Pulmonary/Chest: Effort normal and breath sounds normal.  Musculoskeletal: Normal range of motion.  Neurological: He is alert and oriented to person, place, and time.  Skin: Skin is warm and dry.  Psychiatric: He has a normal mood and affect.    ED  Course  Procedures (including critical care time)  Labs Reviewed - No data to display No results found.   1. Pain, dental       MDM   Pt presenting to the ED for chronic dental pain due to poor dentition and broken teeth.  Has previously scheduled dentist appt 4/18.  Seen earlier today at [facility] ED- nursing note reports pt angry when he was not given narcotics and left without abx rx or d/c paperwork.  Wants referral for dentist so that he can be evaluated sooner.  Given contact info for dentist on call- Dr. Russella Dar.  Rx pen VK and tramadol.  Return precautions advised.        Garlon Hatchet, PA-C 10/11/12 1326

## 2012-10-11 NOTE — ED Notes (Signed)
Pt reports onset of dental pain last night, progressively gotten worse today. Pain on both sides of mouth - hx of multiple tooth extractions. Has appointment with dentist Friday 4/18

## 2012-10-11 NOTE — ED Notes (Signed)
Dr Bebe Shaggy went into room to talk to patient prior to discharge. Patient started verbally assaulting Dr Bebe Shaggy calling him an "asshole" because Dr Bebe Shaggy refused to give prescription for narcotic. Patient walked out of ER continuing to curse leaving without antibiotic prescription/discharge papers and did not sign E-sig.

## 2012-10-11 NOTE — ED Notes (Signed)
Dental pain on bottom teeth since last night.

## 2012-10-12 NOTE — ED Provider Notes (Signed)
Medical screening examination/treatment/procedure(s) were performed by non-physician practitioner and as supervising physician I was immediately available for consultation/collaboration.   Loren Racer, MD 10/12/12 1329

## 2013-01-05 ENCOUNTER — Encounter (HOSPITAL_COMMUNITY): Payer: Self-pay

## 2013-01-05 ENCOUNTER — Emergency Department (HOSPITAL_COMMUNITY)
Admission: EM | Admit: 2013-01-05 | Discharge: 2013-01-05 | Disposition: A | Discharge status: Home / Self Care | Payer: Self-pay | Attending: Emergency Medicine | Admitting: Emergency Medicine

## 2013-01-05 DIAGNOSIS — Z79899 Other long term (current) drug therapy: Secondary | ICD-10-CM | POA: Insufficient documentation

## 2013-01-05 DIAGNOSIS — S39012A Strain of muscle, fascia and tendon of lower back, initial encounter: Secondary | ICD-10-CM

## 2013-01-05 DIAGNOSIS — Y929 Unspecified place or not applicable: Secondary | ICD-10-CM | POA: Insufficient documentation

## 2013-01-05 DIAGNOSIS — X500XXA Overexertion from strenuous movement or load, initial encounter: Secondary | ICD-10-CM | POA: Insufficient documentation

## 2013-01-05 DIAGNOSIS — F259 Schizoaffective disorder, unspecified: Secondary | ICD-10-CM | POA: Insufficient documentation

## 2013-01-05 DIAGNOSIS — S335XXA Sprain of ligaments of lumbar spine, initial encounter: Secondary | ICD-10-CM | POA: Insufficient documentation

## 2013-01-05 DIAGNOSIS — Y9389 Activity, other specified: Secondary | ICD-10-CM | POA: Insufficient documentation

## 2013-01-05 DIAGNOSIS — Z792 Long term (current) use of antibiotics: Secondary | ICD-10-CM | POA: Insufficient documentation

## 2013-01-05 DIAGNOSIS — F17200 Nicotine dependence, unspecified, uncomplicated: Secondary | ICD-10-CM | POA: Insufficient documentation

## 2013-01-05 DIAGNOSIS — Z87448 Personal history of other diseases of urinary system: Secondary | ICD-10-CM | POA: Insufficient documentation

## 2013-01-05 DIAGNOSIS — F319 Bipolar disorder, unspecified: Secondary | ICD-10-CM | POA: Insufficient documentation

## 2013-01-05 MED ORDER — CYCLOBENZAPRINE HCL 10 MG PO TABS: 10.0000 mg | ORAL_TABLET | Freq: Two times a day (BID) | ORAL | Status: DC | PRN

## 2013-01-05 MED ORDER — IBUPROFEN 800 MG PO TABS
800.0000 mg | ORAL_TABLET | Freq: Once | ORAL | Status: AC
  Administered 2013-01-05: 800 mg via ORAL
  Filled 2013-01-05: qty 1

## 2013-01-05 MED ORDER — HYDROCODONE-ACETAMINOPHEN 5-325 MG PO TABS: 1.0000 | ORAL_TABLET | ORAL | Status: DC | PRN

## 2013-01-05 MED ORDER — HYDROCODONE-ACETAMINOPHEN 5-325 MG PO TABS
1.0000 | ORAL_TABLET | Freq: Once | ORAL | Status: AC
  Administered 2013-01-05: 1 via ORAL
  Filled 2013-01-05: qty 1

## 2013-01-05 MED ORDER — IBUPROFEN 800 MG PO TABS: 800.0000 mg | ORAL_TABLET | Freq: Three times a day (TID) | ORAL | Status: DC

## 2013-01-05 NOTE — ED Notes (Signed)
Patient reports that moved a 400 lb cement piece and isnow having lower back pain. No numbness of arms and legs.

## 2013-01-05 NOTE — ED Notes (Signed)
Pt states he has a ride home

## 2013-01-05 NOTE — ED Provider Notes (Signed)
History  This chart was scribed for  by Ladona Ridgel Day, ED scribe. This patient was seen in room WTR9/WTR9 and the patient's care was started at 6:30pm.  CSN: [id number] Arrival date & time 01/05/13  1722  First MD Initiated Contact with Patient 01/05/13 1745     Chief Complaint  Patient presents with  . Back Pain   Patient is a 32 y.o. male presenting with back pain. The history is provided by the patient. No language interpreter was used.  Back Pain Location:  Lumbar spine Quality:  Aching Radiates to:  Does not radiate Pain severity:  Moderate Pain is:  Same all the time Onset quality:  Sudden Duration:  3 hours Timing:  Constant Progression:  Unchanged Chronicity:  New Context: lifting heavy objects   Context: not falling   Relieved by:  Nothing Worsened by:  Nothing tried Ineffective treatments:  None tried Associated symptoms: no abdominal pain, no chest pain, no fever, no numbness, no perianal numbness and no weakness    HPI Comments: Nicholas Nielsen is a 32 y.o. male who presents to the Emergency Department complaining of sudden onset bilateral lower back pain which began 3 hours ago after lifting a "400 lb piece of concrete" and immediately felt lower back pain and has been constant since injury. He states ambulation with some pain secondary to lower back pain. He denies taking any medicines PTA. He denies any other injuries. He denies numbness/weakness to his bilateral lower extremities. He has no allergies to medicines.   Past Medical History  Diagnosis Date  . Bipolar 1 disorder   . Schizo affective schizophrenia   . Testicular cyst    History reviewed. No pertinent past surgical history. Family History  Problem Relation Age of Onset  . Stroke Father   . Hypertension Father    History  Substance Use Topics  . Smoking status: Current Every Day Smoker -- 1.00 packs/day for 15 years    Types: Cigarettes  . Smokeless tobacco: Never Used  . Alcohol Use: No     Review of Systems  Constitutional: Negative for fever and chills.  HENT: Negative for congestion and rhinorrhea.   Respiratory: Negative for cough and shortness of breath.   Cardiovascular: Negative for chest pain.  Gastrointestinal: Negative for nausea, vomiting and abdominal pain.  Musculoskeletal: Positive for back pain (lower lumbar back pain).  Skin: Negative for color change and pallor.  Neurological: Negative for syncope, weakness and numbness.  All other systems reviewed and are negative.   A complete 10 system review of systems was obtained and all systems are negative except as noted in the HPI and PMH.   Allergies  Review of patient's allergies indicates no known allergies.  Home Medications   Current Outpatient Rx  Name  Route  Sig  Dispense  Refill  . amoxicillin (AMOXIL) 500 MG capsule   Oral   Take 1 capsule (500 mg total) by mouth 3 (three) times daily.   21 capsule   0   . citalopram (CELEXA) 20 MG tablet   Oral   Take 20 mg by mouth daily.         Marland Kitchen ibuprofen (ADVIL,MOTRIN) 200 MG tablet   Oral   Take 800 mg by mouth every 6 (six) hours as needed for pain.         . traMADol (ULTRAM) 50 MG tablet   Oral   Take 1 tablet (50 mg total) by mouth every 6 (six) hours as needed for  pain.   15 tablet   0   . traZODone (DESYREL) 50 MG tablet   Oral   Take 50 mg by mouth at bedtime.          Triage Vitals: BP 130/71  Pulse 74  Temp(Src) 98 F (36.7 C) (Oral)  Resp 16  Ht 6\' 1"  (1.854 m)  Wt 182 lb (82.555 kg)  BMI 24.02 kg/m2  SpO2 98% Physical Exam  Nursing note and vitals reviewed. Constitutional: He is oriented to person, place, and time. He appears well-developed and well-nourished. No distress.  HENT:  Head: Normocephalic and atraumatic.  Eyes: EOM are normal.  Neck: Neck supple. No tracheal deviation present.  Cardiovascular: Normal rate.   Pulmonary/Chest: Effort normal. No respiratory distress.  Musculoskeletal: Normal range  of motion.       Back:   Equal strength to bilateral lower extremities. Neurosensory function adequate to both legs. Skin color is normal. Skin is warm and moist. I see no step off deformity, no bony tenderness. Pt is able to ambulate without limp. Pain is relieved when sitting in certain positions. ROM is decreased due to pain. No crepitus, laceration, effusion, swelling.  Pulses are normal   Neurological: He is alert and oriented to person, place, and time.  Skin: Skin is warm and dry.  Psychiatric: He has a normal mood and affect. His behavior is normal.    ED Course  Procedures (including critical care time) DIAGNOSTIC STUDIES: Oxygen Saturation is 98% on room air, normal by my interpretation.    COORDINATION OF CARE: At 630 PM Discussed treatment plan with patient which includes left ankle X-ray. Patient agrees.   Labs Reviewed - No data to display No results found. No diagnosis found. The encounter diagnosis was Back strain, initial encounter.  MDM   Patient with back pain. No neurological deficits. Patient is ambulatory. No warning symptoms of back pain including: loss of bowel or bladder control, night sweats, waking from sleep with back pain, unexplained fevers or weight loss, h/o cancer, IVDU, recent significant  trauma. No concern for cauda equina, epidural abscess, or other serious cause of back pain. Conservative measures such as rest, ice/heat and pain medicine indicated with PCP follow-up if no improvement with conservative management.   Rx: Vicodin #12,  Flexeril and Ibuprofen  I personally performed the services described in this documentation, which was scribed in my presence. The recorded information has been reviewed and is accurate.   Dorthula Matas, PA-C 01/05/13 1844

## 2013-01-05 NOTE — ED Provider Notes (Signed)
Medical screening examination/treatment/procedure(s) were performed by non-physician practitioner and as supervising physician I was immediately available for consultation/collaboration.  Keerthana Vanrossum K Linker, MD 01/05/13 1913 

## 2013-03-22 ENCOUNTER — Emergency Department (HOSPITAL_COMMUNITY)
Admission: EM | Admit: 2013-03-22 | Discharge: 2013-03-22 | Disposition: A | Discharge status: Home / Self Care | Payer: Self-pay | Attending: Emergency Medicine | Admitting: Emergency Medicine

## 2013-03-22 ENCOUNTER — Encounter (HOSPITAL_COMMUNITY): Payer: Self-pay | Admitting: Emergency Medicine

## 2013-03-22 DIAGNOSIS — F319 Bipolar disorder, unspecified: Secondary | ICD-10-CM | POA: Insufficient documentation

## 2013-03-22 DIAGNOSIS — L259 Unspecified contact dermatitis, unspecified cause: Secondary | ICD-10-CM | POA: Insufficient documentation

## 2013-03-22 DIAGNOSIS — Z792 Long term (current) use of antibiotics: Secondary | ICD-10-CM | POA: Insufficient documentation

## 2013-03-22 DIAGNOSIS — Z8659 Personal history of other mental and behavioral disorders: Secondary | ICD-10-CM | POA: Insufficient documentation

## 2013-03-22 DIAGNOSIS — Z87448 Personal history of other diseases of urinary system: Secondary | ICD-10-CM | POA: Insufficient documentation

## 2013-03-22 DIAGNOSIS — F17200 Nicotine dependence, unspecified, uncomplicated: Secondary | ICD-10-CM | POA: Insufficient documentation

## 2013-03-22 DIAGNOSIS — Z79899 Other long term (current) drug therapy: Secondary | ICD-10-CM | POA: Insufficient documentation

## 2013-03-22 MED ORDER — DIPHENHYDRAMINE HCL 25 MG PO CAPS
25.0000 mg | ORAL_CAPSULE | Freq: Once | ORAL | Status: AC
  Administered 2013-03-22: 25 mg via ORAL
  Filled 2013-03-22: qty 1

## 2013-03-22 MED ORDER — PREDNISONE 20 MG PO TABS
40.0000 mg | ORAL_TABLET | Freq: Once | ORAL | Status: AC
  Administered 2013-03-22: 40 mg via ORAL
  Filled 2013-03-22: qty 2

## 2013-03-22 MED ORDER — PREDNISONE 20 MG PO TABS: 40.0000 mg | ORAL_TABLET | Freq: Every day | ORAL | Status: DC

## 2013-03-22 NOTE — ED Provider Notes (Signed)
CSN: [id number]     Arrival date & time 03/22/13  1944 History   First MD Initiated Contact with Patient 03/22/13 1955     Chief Complaint  Patient presents with  . Rash   (Consider location/radiation/quality/duration/timing/severity/associated sxs/prior Treatment) HPI Comments: Patient here with worsening rash to bilateral hands and fore arms - states that he has gotten rashes like this in the past.  Reports intensely itchy.  Denies any plant contact at this time.  Denies fever, chills, new detergents.  Has also noted on his face but denies the soles of his feet.  Patient is a 32 y.o. male presenting with rash. The history is provided by the patient. No language interpreter was used.  Rash Location:  Shoulder/arm Shoulder/arm rash location:  L arm and R arm Quality: burning, draining, dryness, itchiness, painful, redness and weeping   Quality: not blistering, not scaling and not swelling   Pain details:    Quality:  Itching and sore   Severity:  Moderate   Onset quality:  Gradual   Duration:  4 days   Timing:  Constant   Progression:  Worsening Severity:  Moderate Timing:  Constant Progression:  Spreading Context: exposure to similar rash   Context: not animal contact, not chemical exposure, not food, not medications, not new detergent/soap, not nuts, not plant contact, not sick contacts and not sun exposure   Relieved by:  Nothing Worsened by:  Nothing tried Ineffective treatments:  None tried Associated symptoms: no abdominal pain, no fatigue, no fever, no joint pain, no myalgias, no sore throat, not vomiting and not wheezing     Past Medical History  Diagnosis Date  . Bipolar 1 disorder   . Schizo affective schizophrenia   . Testicular cyst    History reviewed. No pertinent past surgical history. Family History  Problem Relation Age of Onset  . Stroke Father   . Hypertension Father    History  Substance Use Topics  . Smoking status: Current Every Day Smoker -- 1.00  packs/day for 15 years    Types: Cigarettes  . Smokeless tobacco: Never Used  . Alcohol Use: No    Review of Systems  Constitutional: Negative for fever and fatigue.  HENT: Negative for sore throat.   Respiratory: Negative for wheezing.   Gastrointestinal: Negative for vomiting and abdominal pain.  Musculoskeletal: Negative for myalgias and arthralgias.  Skin: Positive for rash.  All other systems reviewed and are negative.    Allergies  Review of patient's allergies indicates no known allergies.  Home Medications   Current Outpatient Rx  Name  Route  Sig  Dispense  Refill  . amoxicillin (AMOXIL) 500 MG capsule   Oral   Take 1 capsule (500 mg total) by mouth 3 (three) times daily.   21 capsule   0   . citalopram (CELEXA) 20 MG tablet   Oral   Take 20 mg by mouth daily.         . cyclobenzaprine (FLEXERIL) 10 MG tablet   Oral   Take 1 tablet (10 mg total) by mouth 2 (two) times daily as needed for muscle spasms.   20 tablet   0   . HYDROcodone-acetaminophen (NORCO/VICODIN) 5-325 MG per tablet   Oral   Take 1 tablet by mouth every 4 (four) hours as needed for pain.   12 tablet   0   . ibuprofen (ADVIL,MOTRIN) 200 MG tablet   Oral   Take 800 mg by mouth every 6 (six) hours  as needed for pain.         Marland Kitchen ibuprofen (ADVIL,MOTRIN) 800 MG tablet   Oral   Take 1 tablet (800 mg total) by mouth 3 (three) times daily.   21 tablet   0   . traMADol (ULTRAM) 50 MG tablet   Oral   Take 1 tablet (50 mg total) by mouth every 6 (six) hours as needed for pain.   15 tablet   0   . traZODone (DESYREL) 50 MG tablet   Oral   Take 50 mg by mouth at bedtime.          BP 124/97  Pulse 92  Temp(Src) 98 F (36.7 C) (Oral)  Resp 18  SpO2 100% Physical Exam  Nursing note and vitals reviewed. Constitutional: He is oriented to person, place, and time. He appears well-developed and well-nourished. No distress.  HENT:  Head: Atraumatic.  Mouth/Throat: Oropharynx is  clear and moist. No oropharyngeal exudate.  Diffuse maculopapular rash with some vesicles to the nasolabial folds  Eyes: Conjunctivae are normal. No scleral icterus.  Pulmonary/Chest: Effort normal.  Musculoskeletal: Normal range of motion. He exhibits no edema and no tenderness.  Neurological: He is alert and oriented to person, place, and time. He exhibits normal muscle tone.  Skin: Skin is warm and dry. Rash noted. There is erythema. No pallor.  Bilateral hands and forearms with diffuse erythematous maculopapular rash with some vesicles noted as well.  Psychiatric: He has a normal mood and affect. His behavior is normal. Judgment and thought content normal.    ED Course  Procedures (including critical care time) Labs Review Labs Reviewed - No data to display Imaging Review No results found.  MDM  Contact Dermatitis  Patient with intensely itchy rash to bilateral hands and forearms.  States is painful but patient has a history of chronic pain.  No fever or chills, no recent sick contacts.     Izola Price Marisue Humble, PA-C 03/22/13 2005

## 2013-03-22 NOTE — ED Provider Notes (Signed)
Medical screening examination/treatment/procedure(s) were performed by non-physician practitioner and as supervising physician I was immediately available for consultation/collaboration.  Donnetta Hutching, MD 03/22/13 2257

## 2013-03-22 NOTE — ED Notes (Signed)
Pt presents with rash that spreads throughout arms bilaterally as well as his face. Pt first noticed rash 2 weeks ago and states symptoms have progressively worsened. Pt reports pain, dryness is area of rash.

## 2013-03-31 ENCOUNTER — Encounter (HOSPITAL_COMMUNITY): Payer: Self-pay | Admitting: *Deleted

## 2013-03-31 ENCOUNTER — Encounter (HOSPITAL_COMMUNITY): Payer: Self-pay

## 2013-03-31 ENCOUNTER — Emergency Department (HOSPITAL_COMMUNITY)
Admission: EM | Admit: 2013-03-31 | Discharge: 2013-03-31 | Discharge status: Against Medical Advice | Payer: Self-pay | Attending: Emergency Medicine | Admitting: Emergency Medicine

## 2013-03-31 ENCOUNTER — Emergency Department (HOSPITAL_COMMUNITY)
Admission: EM | Admit: 2013-03-31 | Discharge: 2013-03-31 | Disposition: A | Discharge status: Home / Self Care | Payer: Self-pay | Attending: Emergency Medicine | Admitting: Emergency Medicine

## 2013-03-31 DIAGNOSIS — Z8659 Personal history of other mental and behavioral disorders: Secondary | ICD-10-CM | POA: Insufficient documentation

## 2013-03-31 DIAGNOSIS — Z87448 Personal history of other diseases of urinary system: Secondary | ICD-10-CM | POA: Insufficient documentation

## 2013-03-31 DIAGNOSIS — IMO0002 Reserved for concepts with insufficient information to code with codable children: Secondary | ICD-10-CM | POA: Insufficient documentation

## 2013-03-31 DIAGNOSIS — F319 Bipolar disorder, unspecified: Secondary | ICD-10-CM | POA: Insufficient documentation

## 2013-03-31 DIAGNOSIS — F17200 Nicotine dependence, unspecified, uncomplicated: Secondary | ICD-10-CM | POA: Insufficient documentation

## 2013-03-31 DIAGNOSIS — R21 Rash and other nonspecific skin eruption: Secondary | ICD-10-CM | POA: Insufficient documentation

## 2013-03-31 DIAGNOSIS — Z791 Long term (current) use of non-steroidal anti-inflammatories (NSAID): Secondary | ICD-10-CM | POA: Insufficient documentation

## 2013-03-31 DIAGNOSIS — K029 Dental caries, unspecified: Secondary | ICD-10-CM | POA: Insufficient documentation

## 2013-03-31 DIAGNOSIS — Z79899 Other long term (current) drug therapy: Secondary | ICD-10-CM | POA: Insufficient documentation

## 2013-03-31 MED ORDER — AMOXICILLIN 500 MG PO CAPS: 500.0000 mg | ORAL_CAPSULE | Freq: Three times a day (TID) | ORAL | Status: DC

## 2013-03-31 MED ORDER — HYDROCODONE-ACETAMINOPHEN 5-325 MG PO TABS
1.0000 | ORAL_TABLET | Freq: Once | ORAL | Status: AC
  Administered 2013-03-31: 1 via ORAL
  Filled 2013-03-31: qty 1

## 2013-03-31 MED ORDER — NAPROXEN 500 MG PO TABS: 500.0000 mg | ORAL_TABLET | Freq: Two times a day (BID) | ORAL | Status: DC

## 2013-03-31 NOTE — ED Notes (Signed)
Rash to both hands and face, Seen here recently for same and has finished the prednisone. Itches

## 2013-03-31 NOTE — ED Provider Notes (Signed)
CSN: [id number]     Arrival date & time 03/31/13  0909 History   First MD Initiated Contact with Patient 03/31/13 [phone]     Chief Complaint  Patient presents with  . Dental Pain   (Consider location/radiation/quality/duration/timing/severity/associated sxs/prior Treatment) Patient is a 32 y.o. male presenting with tooth pain. The history is provided by the patient.  Dental Pain Location:  Lower Lower teeth location:  27/RL cuspid and 26/RL lateral incisor Quality:  Throbbing Pain severity now: 8/10. Onset quality:  Gradual Duration:  2 days Timing:  Constant Progression:  Worsening Chronicity:  New Context: dental caries   Relieved by:  Nothing Worsened by:  Cold food/drink Ineffective treatments:  NSAIDs Associated symptoms: no facial swelling, no fever and no headaches   Risk factors: smoking     Past Medical History  Diagnosis Date  . Bipolar 1 disorder   . Schizo affective schizophrenia   . Testicular cyst    History reviewed. No pertinent past surgical history. Family History  Problem Relation Age of Onset  . Stroke Father   . Hypertension Father    History  Substance Use Topics  . Smoking status: Current Every Day Smoker -- 1.00 packs/day for 15 years    Types: Cigarettes  . Smokeless tobacco: Never Used  . Alcohol Use: No    Review of Systems  Constitutional: Negative for fever and chills.  HENT: Positive for dental problem. Negative for facial swelling.   Respiratory: Negative for shortness of breath.   Gastrointestinal: Negative for nausea and vomiting.  Musculoskeletal: Negative for back pain.  Skin: Negative for rash.  Allergic/Immunologic: Negative for environmental allergies.  Neurological: Negative for headaches.  Psychiatric/Behavioral: The patient is not nervous/anxious.     Allergies  Review of patient's allergies indicates no known allergies.  Home Medications   Current Outpatient Rx  Name  Route  Sig  Dispense  Refill  . amoxicillin  (AMOXIL) 500 MG capsule   Oral   Take 1 capsule (500 mg total) by mouth 3 (three) times daily.   21 capsule   0   . citalopram (CELEXA) 20 MG tablet   Oral   Take 20 mg by mouth daily.         . cyclobenzaprine (FLEXERIL) 10 MG tablet   Oral   Take 1 tablet (10 mg total) by mouth 2 (two) times daily as needed for muscle spasms.   20 tablet   0   . HYDROcodone-acetaminophen (NORCO/VICODIN) 5-325 MG per tablet   Oral   Take 1 tablet by mouth every 4 (four) hours as needed for pain.   12 tablet   0   . ibuprofen (ADVIL,MOTRIN) 200 MG tablet   Oral   Take 800 mg by mouth every 6 (six) hours as needed for pain.         Marland Kitchen ibuprofen (ADVIL,MOTRIN) 800 MG tablet   Oral   Take 1 tablet (800 mg total) by mouth 3 (three) times daily.   21 tablet   0   . predniSONE (DELTASONE) 20 MG tablet   Oral   Take 2 tablets (40 mg total) by mouth daily.   15 tablet   0   . traMADol (ULTRAM) 50 MG tablet   Oral   Take 1 tablet (50 mg total) by mouth every 6 (six) hours as needed for pain.   15 tablet   0   . traZODone (DESYREL) 50 MG tablet   Oral   Take 50 mg by mouth  at bedtime.          BP 126/74  Pulse 87  Temp(Src) 98.2 F (36.8 C) (Oral)  Resp 16  SpO2 98% Physical Exam  Nursing note and vitals reviewed. Constitutional: He is oriented to person, place, and time. He appears well-developed and well-nourished.  HENT:  Head: Normocephalic.  Mouth/Throat: Uvula is midline, oropharynx is clear and moist and mucous membranes are normal. Dental caries present.    Eyes: EOM are normal.  Neck: Normal range of motion. Neck supple.  Cardiovascular: Normal rate and regular rhythm.   Pulmonary/Chest: Effort normal and breath sounds normal.  Musculoskeletal: Normal range of motion.  Lymphadenopathy:    He has no cervical adenopathy.  Neurological: He is alert and oriented to person, place, and time. No cranial nerve deficit.  Skin: Skin is warm and dry.  Psychiatric: He  has a normal mood and affect. His behavior is normal.    ED Course  Procedures  32 y.o. male with dental pain due to caries. Will treat with antibiotics and Naprosyn. Patient to follow up with the dental clinic as soon as possible.  Discussed with the patient and all questioned fully answered.   Medication List    STOP taking these medications       cyclobenzaprine 10 MG tablet  Commonly known as:  FLEXERIL     HYDROcodone-acetaminophen 5-325 MG per tablet  Commonly known as:  NORCO/VICODIN     ibuprofen 200 MG tablet  Commonly known as:  ADVIL,MOTRIN     ibuprofen 800 MG tablet  Commonly known as:  ADVIL,MOTRIN     predniSONE 20 MG tablet  Commonly known as:  DELTASONE     traMADol 50 MG tablet  Commonly known as:  ULTRAM     traZODone 50 MG tablet  Commonly known as:  DESYREL      TAKE these medications       amoxicillin 500 MG capsule  Commonly known as:  AMOXIL  Take 1 capsule (500 mg total) by mouth 3 (three) times daily.     naproxen 500 MG tablet  Commonly known as:  NAPROSYN  Take 1 tablet (500 mg total) by mouth 2 (two) times daily.      ASK your doctor about these medications       citalopram 20 MG tablet  Commonly known as:  CELEXA  Take 20 mg by mouth daily.           [facility] Orlene Och, [place] 04/01/13 [phone]

## 2013-03-31 NOTE — ED Notes (Signed)
No answer

## 2013-03-31 NOTE — ED Notes (Signed)
Pt reports pain to his rt bottom tooth for the past 2 days.  Pt reports " i have a lot of bad teeth so im not sure which one it is".  Pt denies any fever.

## 2013-04-01 ENCOUNTER — Emergency Department (HOSPITAL_COMMUNITY)
Admission: EM | Admit: 2013-04-01 | Discharge: 2013-04-01 | Disposition: A | Discharge status: Home / Self Care | Payer: Self-pay | Attending: Emergency Medicine | Admitting: Emergency Medicine

## 2013-04-01 ENCOUNTER — Encounter (HOSPITAL_COMMUNITY): Payer: Self-pay | Admitting: *Deleted

## 2013-04-01 DIAGNOSIS — Z791 Long term (current) use of non-steroidal anti-inflammatories (NSAID): Secondary | ICD-10-CM | POA: Insufficient documentation

## 2013-04-01 DIAGNOSIS — L259 Unspecified contact dermatitis, unspecified cause: Secondary | ICD-10-CM

## 2013-04-01 DIAGNOSIS — F17200 Nicotine dependence, unspecified, uncomplicated: Secondary | ICD-10-CM | POA: Insufficient documentation

## 2013-04-01 DIAGNOSIS — F319 Bipolar disorder, unspecified: Secondary | ICD-10-CM | POA: Insufficient documentation

## 2013-04-01 DIAGNOSIS — Z79899 Other long term (current) drug therapy: Secondary | ICD-10-CM | POA: Insufficient documentation

## 2013-04-01 DIAGNOSIS — Z792 Long term (current) use of antibiotics: Secondary | ICD-10-CM | POA: Insufficient documentation

## 2013-04-01 DIAGNOSIS — Z87448 Personal history of other diseases of urinary system: Secondary | ICD-10-CM | POA: Insufficient documentation

## 2013-04-01 MED ORDER — LORATADINE 10 MG PO TABS
10.0000 mg | ORAL_TABLET | Freq: Once | ORAL | Status: AC
  Administered 2013-04-01: 10 mg via ORAL
  Filled 2013-04-01: qty 1

## 2013-04-01 MED ORDER — TRIAMCINOLONE ACETONIDE 0.1 % EX CREA: TOPICAL_CREAM | Freq: Two times a day (BID) | CUTANEOUS | Status: DC

## 2013-04-01 MED ORDER — HYDROXYZINE PAMOATE 25 MG PO CAPS: ORAL_CAPSULE | ORAL | Status: DC

## 2013-04-01 MED ORDER — PREDNISONE 50 MG PO TABS
60.0000 mg | ORAL_TABLET | Freq: Once | ORAL | Status: AC
  Administered 2013-04-01: 60 mg via ORAL
  Filled 2013-04-01 (×2): qty 1

## 2013-04-01 MED ORDER — DEXAMETHASONE 6 MG PO TABS: ORAL_TABLET | ORAL | Status: DC

## 2013-04-01 NOTE — ED Provider Notes (Signed)
CSN: [id number]     Arrival date & time 04/01/13  1741 History   First MD Initiated Contact with Patient 04/01/13 1842     Chief Complaint  Patient presents with  . Rash   (Consider location/radiation/quality/duration/timing/severity/associated sxs/prior Treatment) Patient is a 32 y.o. male presenting with rash. The history is provided by the patient.  Rash Location:  Hand and shoulder/arm Shoulder/arm rash location:  L arm and R arm Hand rash location:  L hand and R hand Quality: burning, itchiness and redness   Severity:  Moderate Onset quality:  Gradual Duration:  3 weeks Timing:  Intermittent Progression:  Worsening Chronicity:  Recurrent Context: animal contact and chemical exposure   Relieved by:  Nothing Worsened by:  Nothing tried Associated symptoms: no abdominal pain, no fever, no joint pain, no nausea, no shortness of breath, no throat swelling, no tongue swelling, not vomiting and not wheezing     Past Medical History  Diagnosis Date  . Bipolar 1 disorder   . Schizo affective schizophrenia   . Testicular cyst    History reviewed. No pertinent past surgical history. Family History  Problem Relation Age of Onset  . Stroke Father   . Hypertension Father    History  Substance Use Topics  . Smoking status: Current Every Day Smoker -- 1.00 packs/day for 15 years    Types: Cigarettes  . Smokeless tobacco: Never Used  . Alcohol Use: No    Review of Systems  Constitutional: Negative for fever and activity change.       All ROS Neg except as noted in HPI  HENT: Negative for nosebleeds and neck pain.   Eyes: Negative for photophobia and discharge.  Respiratory: Negative for cough, shortness of breath and wheezing.   Cardiovascular: Negative for chest pain and palpitations.  Gastrointestinal: Negative for nausea, vomiting, abdominal pain and blood in stool.  Genitourinary: Negative for dysuria, frequency and hematuria.  Musculoskeletal: Negative for back pain  and arthralgias.  Skin: Positive for rash.  Neurological: Negative for dizziness, seizures and speech difficulty.  Psychiatric/Behavioral: Negative for hallucinations and confusion.       Bipolar schizo affective disorder    Allergies  Review of patient's allergies indicates no known allergies.  Home Medications   Current Outpatient Rx  Name  Route  Sig  Dispense  Refill  . amoxicillin (AMOXIL) 500 MG capsule   Oral   Take 1 capsule (500 mg total) by mouth 3 (three) times daily.   21 capsule   0   . citalopram (CELEXA) 20 MG tablet   Oral   Take 20 mg by mouth daily.         . naproxen (NAPROSYN) 500 MG tablet   Oral   Take 1 tablet (500 mg total) by mouth 2 (two) times daily.   20 tablet   0    BP 128/90  Pulse 91  Temp(Src) 98.3 F (36.8 C) (Oral)  Resp 20  Ht 6\' 1"  (1.854 m)  Wt 180 lb (81.647 kg)  BMI 23.75 kg/m2  SpO2 98% Physical Exam  Nursing note and vitals reviewed. Constitutional: He is oriented to person, place, and time. He appears well-developed and well-nourished.  Non-toxic appearance.  HENT:  Head: Normocephalic.  Right Ear: Tympanic membrane and external ear normal.  Left Ear: Tympanic membrane and external ear normal.  Eyes: EOM and lids are normal. Pupils are equal, round, and reactive to light.  Neck: Normal range of motion. Neck supple. Carotid bruit is not  present.  Red papular rash of the anterior neck.  Cardiovascular: Normal rate, regular rhythm, normal heart sounds, intact distal pulses and normal pulses.   Pulmonary/Chest: Breath sounds normal. No respiratory distress.  Abdominal: Soft. Bowel sounds are normal. There is no tenderness. There is no guarding.  Musculoskeletal: Normal range of motion.  Red macular-papular rash of the fingers, hands, and forarm of right and left upper ext.  Lymphadenopathy:       Head (right side): No submandibular adenopathy present.       Head (left side): No submandibular adenopathy present.    He  has no cervical adenopathy.  Neurological: He is alert and oriented to person, place, and time. He has normal strength. No cranial nerve deficit or sensory deficit.  Skin: Skin is warm and dry.  Psychiatric: He has a normal mood and affect. His speech is normal.    ED Course  Procedures (including critical care time) Labs Review Labs Reviewed - No data to display Imaging Review No results found.  MDM  No diagnosis found. **I have reviewed nursing notes, vital signs, and all appropriate lab and imaging results for this patient.*  Pt has recurrent rash of the hands and arms. He is unsure of the trigger for this. No swelling of the mouth or face. No wheezing or low pulse ox on exam. Suspect contact dermatitis. Pt referred to Dr Palominas Callas - Allergist. Rx for decadron, triamcinolone, and vistaril given to the patient.  Kathie Dike, PA-C 04/01/13 1921

## 2013-04-01 NOTE — ED Notes (Signed)
Pt states rash began last Sunday. Was treated for rash with prednisone, which relieved symptoms. Rash came back Sunday morning, a day after his last dose of prednisone. Rash began spreading up arms, neck and face yesterday.

## 2013-04-01 NOTE — ED Notes (Signed)
Rash to hands, neck  Seen here for same, treated with prednisone, and improved.  Then began again

## 2013-04-02 NOTE — ED Provider Notes (Signed)
Medical screening examination/treatment/procedure(s) were performed by non-physician practitioner and as supervising physician I was immediately available for consultation/collaboration.   Shelda Jakes, MD 04/02/13 [phone]

## 2013-04-03 NOTE — ED Provider Notes (Signed)
Medical screening examination/treatment/procedure(s) were performed by non-physician practitioner and as supervising physician I was immediately available for consultation/collaboration.   Mance Vallejo J Taryne Kiger, MD 04/03/13 1108 

## 2013-04-05 ENCOUNTER — Encounter (HOSPITAL_COMMUNITY): Payer: Self-pay | Admitting: *Deleted

## 2013-04-05 ENCOUNTER — Emergency Department (HOSPITAL_COMMUNITY)
Admission: EM | Admit: 2013-04-05 | Discharge: 2013-04-05 | Disposition: A | Discharge status: Home / Self Care | Payer: Self-pay | Attending: Emergency Medicine | Admitting: Emergency Medicine

## 2013-04-05 DIAGNOSIS — F259 Schizoaffective disorder, unspecified: Secondary | ICD-10-CM | POA: Insufficient documentation

## 2013-04-05 DIAGNOSIS — Z87448 Personal history of other diseases of urinary system: Secondary | ICD-10-CM | POA: Insufficient documentation

## 2013-04-05 DIAGNOSIS — Z79899 Other long term (current) drug therapy: Secondary | ICD-10-CM | POA: Insufficient documentation

## 2013-04-05 DIAGNOSIS — F17200 Nicotine dependence, unspecified, uncomplicated: Secondary | ICD-10-CM | POA: Insufficient documentation

## 2013-04-05 DIAGNOSIS — F319 Bipolar disorder, unspecified: Secondary | ICD-10-CM | POA: Insufficient documentation

## 2013-04-05 DIAGNOSIS — Y9289 Other specified places as the place of occurrence of the external cause: Secondary | ICD-10-CM | POA: Insufficient documentation

## 2013-04-05 DIAGNOSIS — S335XXA Sprain of ligaments of lumbar spine, initial encounter: Secondary | ICD-10-CM | POA: Insufficient documentation

## 2013-04-05 DIAGNOSIS — X500XXA Overexertion from strenuous movement or load, initial encounter: Secondary | ICD-10-CM | POA: Insufficient documentation

## 2013-04-05 DIAGNOSIS — Y990 Civilian activity done for income or pay: Secondary | ICD-10-CM | POA: Insufficient documentation

## 2013-04-05 DIAGNOSIS — S39012A Strain of muscle, fascia and tendon of lower back, initial encounter: Secondary | ICD-10-CM

## 2013-04-05 DIAGNOSIS — Y9389 Activity, other specified: Secondary | ICD-10-CM | POA: Insufficient documentation

## 2013-04-05 HISTORY — DX: Dorsalgia, unspecified: M54.9

## 2013-04-05 MED ORDER — CYCLOBENZAPRINE HCL 10 MG PO TABS: 10.0000 mg | ORAL_TABLET | Freq: Three times a day (TID) | ORAL | Status: DC | PRN

## 2013-04-05 MED ORDER — METHOCARBAMOL 500 MG PO TABS
1000.0000 mg | ORAL_TABLET | Freq: Once | ORAL | Status: AC
  Administered 2013-04-05: 1000 mg via ORAL
  Filled 2013-04-05: qty 2

## 2013-04-05 MED ORDER — IBUPROFEN 800 MG PO TABS
800.0000 mg | ORAL_TABLET | Freq: Once | ORAL | Status: AC
  Administered 2013-04-05: 800 mg via ORAL
  Filled 2013-04-05: qty 1

## 2013-04-05 MED ORDER — TRAMADOL HCL 50 MG PO TABS: 50.0000 mg | ORAL_TABLET | Freq: Four times a day (QID) | ORAL | Status: DC | PRN

## 2013-04-05 NOTE — ED Notes (Addendum)
Twisted to pick up paper off floor at work today feeling sharp pain in L lower back.  No radiation of pai. Hx of chronic back problems.

## 2013-04-05 NOTE — ED Provider Notes (Signed)
CSN: [id number]     Arrival date & time 04/05/13  1336 History   First MD Initiated Contact with Patient 04/05/13 1450     Chief Complaint  Patient presents with  . Back Pain   (Consider location/radiation/quality/duration/timing/severity/associated sxs/prior Treatment) Patient is a 32 y.o. male presenting with back pain. The history is provided by the patient.  Back Pain Location:  Lumbar spine Quality:  Shooting and stabbing Radiates to:  L posterior upper leg, L thigh and L knee Pain severity:  Moderate Pain is:  Same all the time Onset quality:  Sudden Duration:  3 hours Timing:  Constant Progression:  Worsening Chronicity:  Recurrent Context: twisting   Context comment:  Bent over to pick up something on the floor, felt a sharp pain to left low back Relieved by:  Nothing Worsened by:  Bending, twisting, standing and sitting Ineffective treatments:  None tried Associated symptoms: leg pain   Associated symptoms: no abdominal pain, no abdominal swelling, no bladder incontinence, no bowel incontinence, no chest pain, no dysuria, no fever, no headaches, no numbness, no paresthesias, no pelvic pain, no perianal numbness, no tingling and no weakness   Risk factors: no recent surgery and no vascular disease     Past Medical History  Diagnosis Date  . Bipolar 1 disorder   . Schizo affective schizophrenia   . Testicular cyst   . Back pain    History reviewed. No pertinent past surgical history. Family History  Problem Relation Age of Onset  . Stroke Father   . Hypertension Father    History  Substance Use Topics  . Smoking status: Current Every Day Smoker -- 1.00 packs/day for 15 years    Types: Cigarettes  . Smokeless tobacco: Never Used  . Alcohol Use: No    Review of Systems  Constitutional: Negative for fever.  Respiratory: Negative for shortness of breath.   Cardiovascular: Negative for chest pain.  Gastrointestinal: Negative for vomiting, abdominal pain,  constipation and bowel incontinence.  Genitourinary: Negative for bladder incontinence, dysuria, hematuria, flank pain, decreased urine volume, difficulty urinating and pelvic pain.       No perineal numbness or incontinence of urine or feces  Musculoskeletal: Positive for back pain. Negative for joint swelling.  Skin: Negative for rash.  Neurological: Negative for tingling, weakness, numbness, headaches and paresthesias.  All other systems reviewed and are negative.    Allergies  Review of patient's allergies indicates no known allergies.  Home Medications   Current Outpatient Rx  Name  Route  Sig  Dispense  Refill  . amoxicillin (AMOXIL) 500 MG capsule   Oral   Take 1 capsule (500 mg total) by mouth 3 (three) times daily.   21 capsule   0   . clonazePAM (KLONOPIN) 1 MG tablet   Oral   Take 1 mg by mouth 2 (two) times daily.         Marland Kitchen dexamethasone (DECADRON) 6 MG tablet   Oral   Take 6 mg by mouth 2 (two) times daily with a meal. 1 po bid with food         . hydrOXYzine (VISTARIL) 25 MG capsule      1 at hs for itching and pain   15 capsule   0     May cause drowsiness.   . triamcinolone cream (KENALOG) 0.1 %   Topical   Apply topically 2 (two) times daily.   454 g   0   . naproxen (NAPROSYN) 500  MG tablet   Oral   Take 1 tablet (500 mg total) by mouth 2 (two) times daily.   20 tablet   0    There were no vitals taken for this visit. Physical Exam  Nursing note and vitals reviewed. Constitutional: He is oriented to person, place, and time. He appears well-developed and well-nourished. No distress.  HENT:  Head: Normocephalic and atraumatic.  Neck: Normal range of motion. Neck supple.  Cardiovascular: Normal rate, regular rhythm, normal heart sounds and intact distal pulses.   No murmur heard. Pulmonary/Chest: Effort normal and breath sounds normal. No respiratory distress.  Abdominal: Soft. He exhibits no distension. There is no tenderness.   Musculoskeletal: He exhibits tenderness. He exhibits no edema.       Lumbar back: He exhibits tenderness and pain. He exhibits normal range of motion, no swelling, no deformity, no laceration and normal pulse.  ttp of the left lumbar paraspinal muscles and SI joint space.  No spinal tenderness.  DP pulses are brisk and symmetrical.  Distal sensation intact.  Hip Flexors/Extensors are intact  Neurological: He is alert and oriented to person, place, and time. He has normal strength. No sensory deficit. He exhibits normal muscle tone. Coordination and gait normal.  Reflex Scores:      Patellar reflexes are 2+ on the right side and 2+ on the left side.      Achilles reflexes are 2+ on the right side and 2+ on the left side. Skin: Skin is warm and dry. No rash noted.    ED Course  Procedures (including critical care time) Labs Review Labs Reviewed - No data to display Imaging Review No results found.  MDM    Left lumbar back pain.  No focal neuro deficits.  Ambulated with a steady giat, no concerning sx's for emergent neurological or infectious process.  Hx of chronic back pain.  VSS.  Appears stable for discharge  Viridiana Spaid L. Jaedyn Lard, PA-C 04/06/13 1405

## 2013-04-09 NOTE — ED Provider Notes (Signed)
Medical screening examination/treatment/procedure(s) were performed by non-physician practitioner and as supervising physician I was immediately available for consultation/collaboration.   Roney Marion, MD 04/09/13 [phone]

## 2013-07-27 ENCOUNTER — Encounter (HOSPITAL_COMMUNITY): Payer: Self-pay | Admitting: Emergency Medicine

## 2013-07-27 ENCOUNTER — Emergency Department (HOSPITAL_COMMUNITY)
Admission: EM | Admit: 2013-07-27 | Discharge: 2013-07-27 | Disposition: A | Discharge status: Home / Self Care | Payer: Self-pay | Attending: Emergency Medicine | Admitting: Emergency Medicine

## 2013-07-27 DIAGNOSIS — Z79899 Other long term (current) drug therapy: Secondary | ICD-10-CM | POA: Insufficient documentation

## 2013-07-27 DIAGNOSIS — R220 Localized swelling, mass and lump, head: Secondary | ICD-10-CM | POA: Insufficient documentation

## 2013-07-27 DIAGNOSIS — F17200 Nicotine dependence, unspecified, uncomplicated: Secondary | ICD-10-CM | POA: Insufficient documentation

## 2013-07-27 DIAGNOSIS — M549 Dorsalgia, unspecified: Secondary | ICD-10-CM | POA: Insufficient documentation

## 2013-07-27 DIAGNOSIS — K089 Disorder of teeth and supporting structures, unspecified: Secondary | ICD-10-CM | POA: Insufficient documentation

## 2013-07-27 DIAGNOSIS — Z8659 Personal history of other mental and behavioral disorders: Secondary | ICD-10-CM | POA: Insufficient documentation

## 2013-07-27 DIAGNOSIS — K0889 Other specified disorders of teeth and supporting structures: Secondary | ICD-10-CM

## 2013-07-27 DIAGNOSIS — R221 Localized swelling, mass and lump, neck: Secondary | ICD-10-CM

## 2013-07-27 DIAGNOSIS — Z87448 Personal history of other diseases of urinary system: Secondary | ICD-10-CM | POA: Insufficient documentation

## 2013-07-27 DIAGNOSIS — Z8619 Personal history of other infectious and parasitic diseases: Secondary | ICD-10-CM | POA: Insufficient documentation

## 2013-07-27 DIAGNOSIS — IMO0002 Reserved for concepts with insufficient information to code with codable children: Secondary | ICD-10-CM | POA: Insufficient documentation

## 2013-07-27 HISTORY — DX: Unspecified viral hepatitis C without hepatic coma: B19.20

## 2013-07-27 MED ORDER — AMOXICILLIN 500 MG PO CAPS: 500.0000 mg | ORAL_CAPSULE | Freq: Three times a day (TID) | ORAL | Status: DC

## 2013-07-27 MED ORDER — NAPROXEN 250 MG PO TABS
500.0000 mg | ORAL_TABLET | Freq: Once | ORAL | Status: AC
  Administered 2013-07-27: 500 mg via ORAL
  Filled 2013-07-27: qty 2

## 2013-07-27 MED ORDER — AMOXICILLIN 250 MG PO CAPS
500.0000 mg | ORAL_CAPSULE | Freq: Once | ORAL | Status: AC
  Administered 2013-07-27: 500 mg via ORAL
  Filled 2013-07-27: qty 2

## 2013-07-27 MED ORDER — NAPROXEN 500 MG PO TABS: 500.0000 mg | ORAL_TABLET | Freq: Two times a day (BID) | ORAL | Status: DC

## 2013-07-27 NOTE — Discharge Instructions (Signed)
Dental Pain °Toothache is pain in or around a tooth. It may get worse with chewing or with cold or heat.  °HOME CARE °· Your dentist may use a numbing medicine during treatment. If so, you may need to avoid eating until the medicine wears off. Ask your dentist about this. °· Only take medicine as told by your dentist or doctor. °· Avoid chewing food near the painful tooth until after all treatment is done. Ask your dentist about this. °GET HELP RIGHT AWAY IF:  °· The problem gets worse or new problems appear. °· You have a fever. °· There is redness and puffiness (swelling) of the face, jaw, or neck. °· You cannot open your mouth. °· There is pain in the jaw. °· There is very bad pain that is not helped by medicine. °MAKE SURE YOU:  °· Understand these instructions. °· Will watch your condition. °· Will get help right away if you are not doing well or get worse. °Document Released: 12/06/2007 Document Revised: 09/11/2011 Document Reviewed: 12/06/2007 °[employer]® Patient Information ©2014 [employer]. ° °

## 2013-07-27 NOTE — ED Provider Notes (Signed)
CSN: [id number]     Arrival date & time 07/27/13  1129 History   First MD Initiated Contact with Patient 07/27/13 1231     Chief Complaint  Patient presents with  . Dental Pain   (Consider location/radiation/quality/duration/timing/severity/associated sxs/prior Treatment) Patient is a 33 y.o. male presenting with tooth pain. The history is provided by the patient.  Dental Pain Location:  Lower Quality:  Aching Severity:  Moderate Onset quality:  Gradual Duration:  2 days Timing:  Intermittent Progression:  Worsening Chronicity:  Chronic Context: poor dentition   Context: not abscess   Relieved by:  Nothing Ineffective treatments:  NSAIDs Associated symptoms: gum swelling   Associated symptoms: no facial swelling, no fever and no neck pain     Past Medical History  Diagnosis Date  . Bipolar 1 disorder   . Schizo affective schizophrenia   . Testicular cyst   . Back pain   . Hepatitis C    History reviewed. No pertinent past surgical history. Family History  Problem Relation Age of Onset  . Stroke Father   . Hypertension Father    History  Substance Use Topics  . Smoking status: Current Every Day Smoker -- 1.00 packs/day for 15 years    Types: Cigarettes  . Smokeless tobacco: Never Used  . Alcohol Use: No    Review of Systems  Constitutional: Negative for fever and activity change.       All ROS Neg except as noted in HPI  HENT: Negative for facial swelling and nosebleeds.   Eyes: Negative for photophobia and discharge.  Respiratory: Negative for cough, shortness of breath and wheezing.   Cardiovascular: Negative for chest pain and palpitations.  Gastrointestinal: Negative for abdominal pain and blood in stool.  Genitourinary: Negative for dysuria, frequency and hematuria.  Musculoskeletal: Positive for back pain. Negative for arthralgias and neck pain.  Skin: Negative.   Neurological: Negative for dizziness, seizures and speech difficulty.   Psychiatric/Behavioral: Negative for hallucinations and confusion.    Allergies  Review of patient's allergies indicates no known allergies.  Home Medications   Current Outpatient Rx  Name  Route  Sig  Dispense  Refill  . amoxicillin (AMOXIL) 500 MG capsule   Oral   Take 1 capsule (500 mg total) by mouth 3 (three) times daily.   21 capsule   0   . clonazePAM (KLONOPIN) 1 MG tablet   Oral   Take 1 mg by mouth 2 (two) times daily.         . cyclobenzaprine (FLEXERIL) 10 MG tablet   Oral   Take 1 tablet (10 mg total) by mouth 3 (three) times daily as needed.   21 tablet   0   . dexamethasone (DECADRON) 6 MG tablet   Oral   Take 6 mg by mouth 2 (two) times daily with a meal. 1 po bid with food         . hydrOXYzine (VISTARIL) 25 MG capsule      1 at hs for itching and pain   15 capsule   0     May cause drowsiness.   . naproxen (NAPROSYN) 500 MG tablet   Oral   Take 1 tablet (500 mg total) by mouth 2 (two) times daily.   20 tablet   0   . traMADol (ULTRAM) 50 MG tablet   Oral   Take 1 tablet (50 mg total) by mouth every 6 (six) hours as needed for pain.   15 tablet  0   . triamcinolone cream (KENALOG) 0.1 %   Topical   Apply topically 2 (two) times daily.   454 g   0    BP 152/89  Pulse 96  Temp(Src) 98.2 F (36.8 C) (Oral)  Resp 18  Ht 6\' 1"  (1.854 m)  Wt 182 lb (82.555 kg)  BMI 24.02 kg/m2  SpO2 97% Physical Exam  Nursing note and vitals reviewed. Constitutional: He is oriented to person, place, and time. He appears well-developed and well-nourished.  Non-toxic appearance.  HENT:  Head: Normocephalic.  Right Ear: Tympanic membrane and external ear normal.  Left Ear: Tympanic membrane and external ear normal.  Patient is a teacher please the upper mouth. Multiple dental caries of the lower gum area. Mild swelling behind 2 for #25 and 26. No abscess noted. No swelling under the tongue. The airway is patent.  Eyes: EOM and lids are normal.  Pupils are equal, round, and reactive to light.  Neck: Normal range of motion. Neck supple. Carotid bruit is not present.  Cardiovascular: Normal rate, regular rhythm, normal heart sounds, intact distal pulses and normal pulses.   Pulmonary/Chest: Breath sounds normal. No respiratory distress.  Abdominal: Soft. Bowel sounds are normal. There is no tenderness. There is no guarding.  Musculoskeletal: Normal range of motion.  Lymphadenopathy:       Head (right side): No submandibular adenopathy present.       Head (left side): No submandibular adenopathy present.    He has no cervical adenopathy.  Neurological: He is alert and oriented to person, place, and time. He has normal strength. No cranial nerve deficit or sensory deficit.  Skin: Skin is warm and dry.  Psychiatric: He has a normal mood and affect. His speech is normal.    ED Course  Procedures (including critical care time) Labs Review Labs Reviewed - No data to display Imaging Review No results found.  EKG Interpretation   None       MDM  No diagnosis found. *I have reviewed nursing notes, vital signs, and all appropriate lab and imaging results for this patient.**  Patient has dental caries that have been present for some time. They recently begun to bother him more. The airway is patent. There is no evidence forLudwig's angina. The patient will be treated with amoxicillin and Naprosyn 500 mg.  Kathie DikeHobson M Savon Bordonaro, PA-C 07/27/13 1325

## 2013-07-27 NOTE — ED Notes (Signed)
Patient reports right lower dental pain since Friday. Patient reports taking tylenol, aspirin, ibuprofen, and orajel with no relief. Patient was told 7-8 months ago while trying to donate plasma that he had Hepatitis C. Patient concerned about health and would like to take to EDP about it.

## 2013-07-27 NOTE — ED Provider Notes (Signed)
Medical screening examination/treatment/procedure(s) were performed by non-physician practitioner and as supervising physician I was immediately available for consultation/collaboration.  EKG Interpretation   None         Glynn OctaveStephen Tan Clopper, MD 07/27/13 1511

## 2013-08-10 ENCOUNTER — Emergency Department (HOSPITAL_COMMUNITY)
Admission: EM | Admit: 2013-08-10 | Discharge: 2013-08-10 | Disposition: A | Discharge status: Home / Self Care | Payer: Self-pay | Attending: Emergency Medicine | Admitting: Emergency Medicine

## 2013-08-10 ENCOUNTER — Emergency Department (HOSPITAL_COMMUNITY): Payer: Self-pay

## 2013-08-10 ENCOUNTER — Encounter (HOSPITAL_COMMUNITY): Payer: Self-pay | Admitting: Emergency Medicine

## 2013-08-10 DIAGNOSIS — F17200 Nicotine dependence, unspecified, uncomplicated: Secondary | ICD-10-CM | POA: Insufficient documentation

## 2013-08-10 DIAGNOSIS — Z87442 Personal history of urinary calculi: Secondary | ICD-10-CM | POA: Insufficient documentation

## 2013-08-10 DIAGNOSIS — Z8659 Personal history of other mental and behavioral disorders: Secondary | ICD-10-CM | POA: Insufficient documentation

## 2013-08-10 DIAGNOSIS — K5900 Constipation, unspecified: Secondary | ICD-10-CM | POA: Insufficient documentation

## 2013-08-10 DIAGNOSIS — Z8619 Personal history of other infectious and parasitic diseases: Secondary | ICD-10-CM | POA: Insufficient documentation

## 2013-08-10 DIAGNOSIS — R109 Unspecified abdominal pain: Secondary | ICD-10-CM | POA: Insufficient documentation

## 2013-08-10 DIAGNOSIS — Z87448 Personal history of other diseases of urinary system: Secondary | ICD-10-CM | POA: Insufficient documentation

## 2013-08-10 DIAGNOSIS — R05 Cough: Secondary | ICD-10-CM | POA: Insufficient documentation

## 2013-08-10 DIAGNOSIS — Z91040 Latex allergy status: Secondary | ICD-10-CM | POA: Insufficient documentation

## 2013-08-10 DIAGNOSIS — R059 Cough, unspecified: Secondary | ICD-10-CM | POA: Insufficient documentation

## 2013-08-10 DIAGNOSIS — Z8739 Personal history of other diseases of the musculoskeletal system and connective tissue: Secondary | ICD-10-CM | POA: Insufficient documentation

## 2013-08-10 DIAGNOSIS — R0602 Shortness of breath: Secondary | ICD-10-CM | POA: Insufficient documentation

## 2013-08-10 LAB — URINALYSIS, ROUTINE W REFLEX MICROSCOPIC
BILIRUBIN URINE: NEGATIVE
GLUCOSE, UA: NEGATIVE mg/dL
Hgb urine dipstick: NEGATIVE
Ketones, ur: NEGATIVE mg/dL
Leukocytes, UA: NEGATIVE
Nitrite: NEGATIVE
Protein, ur: NEGATIVE mg/dL
Specific Gravity, Urine: 1.02 (ref 1.005–1.030)
UROBILINOGEN UA: 0.2 mg/dL (ref 0.0–1.0)
pH: 6 (ref 5.0–8.0)

## 2013-08-10 MED ORDER — SODIUM CHLORIDE 0.9 % IV SOLN
INTRAVENOUS | Status: DC
  Administered 2013-08-10: 30 mL/h via INTRAVENOUS

## 2013-08-10 MED ORDER — HYDROMORPHONE HCL PF 1 MG/ML IJ SOLN
1.0000 mg | Freq: Once | INTRAMUSCULAR | Status: AC
  Administered 2013-08-10: 1 mg via INTRAVENOUS
  Filled 2013-08-10: qty 1

## 2013-08-10 MED ORDER — ONDANSETRON HCL 4 MG/2ML IJ SOLN
4.0000 mg | Freq: Once | INTRAMUSCULAR | Status: AC
  Administered 2013-08-10: 4 mg via INTRAVENOUS
  Filled 2013-08-10: qty 2

## 2013-08-10 MED ORDER — OXYCODONE-ACETAMINOPHEN 5-325 MG PO TABS: 1.0000 | ORAL_TABLET | ORAL | Status: DC | PRN

## 2013-08-10 NOTE — ED Provider Notes (Signed)
CSN: [id number]     Arrival date & time 08/10/13  0819 History  This chart was scribed for Flint MelterElliott L Zoha Spranger, MD by Quintella ReichertMatthew Underwood, ED scribe.  This patient was seen in room [facility] and the patient's care was started at 8:59 AM.   Chief Complaint  Patient presents with  . Abdominal Pain    The history is provided by the patient. No language interpreter was used.    HPI Comments: Nicholas Nielsen is a 33 y.o. male who presents to the Emergency Department complaining of left-sided abdominal pain that has been ongoing for the past month.  Ps states he has been having sharp pain in the left lateral abdomen that fadiates up into his left chest and shoulder.  It has been intermittent for the past month but has been more persistent for the past several hours.   He also reports some cough and SOB but he attributes this to smoking.  He denies fever or decreased appetite.  He has not been seen by anyone else for this complaint.  Pt denies taking any medications currently.  He was taking trazodone for sleep but he has stopped taking this.  He has h/o kidney stones.   Past Medical History  Diagnosis Date  . Bipolar 1 disorder   . Schizo affective schizophrenia   . Testicular cyst   . Back pain   . Hepatitis C     History reviewed. No pertinent past surgical history.   Family History  Problem Relation Age of Onset  . Stroke Father   . Hypertension Father     History  Substance Use Topics  . Smoking status: Current Every Day Smoker -- 1.00 packs/day for 15 years    Types: Cigarettes  . Smokeless tobacco: Never Used  . Alcohol Use: No     Review of Systems  Constitutional: Negative for fever and appetite change.  Respiratory: Positive for cough (attributes to smoking) and shortness of breath (attributes to smoking).   Gastrointestinal: Positive for abdominal pain.  All other systems reviewed and are negative.     Allergies  Latex  Home Medications   Current Outpatient Rx   Name  Route  Sig  Dispense  Refill  . ibuprofen (ADVIL,MOTRIN) 800 MG tablet   Oral   Take 800 mg by mouth every 8 (eight) hours as needed for moderate pain.         . Multiple Vitamin (MULTIVITAMIN WITH MINERALS) TABS tablet   Oral   Take 1 tablet by mouth daily.         Marland Kitchen. oxyCODONE (OXY IR/ROXICODONE) 5 MG immediate release tablet   Oral   Take 5 mg by mouth every 4 (four) hours as needed for severe pain.         Marland Kitchen. oxyCODONE-acetaminophen (PERCOCET) 5-325 MG per tablet   Oral   Take 1 tablet by mouth every 4 (four) hours as needed.   10 tablet   0    BP 124/76  Pulse 93  Temp(Src) 98.1 F (36.7 C) (Oral)  Resp 22  Ht 6\' 1"  (1.854 m)  Wt 178 lb (80.74 kg)  BMI 23.49 kg/m2  SpO2 100%  Physical Exam  Nursing note and vitals reviewed. Constitutional: He is oriented to person, place, and time. He appears well-developed and well-nourished.  HENT:  Head: Normocephalic and atraumatic.  Right Ear: External ear normal.  Left Ear: External ear normal.  Eyes: Conjunctivae and EOM are normal. Pupils are equal, round, and reactive  to light.  Neck: Normal range of motion and phonation normal. Neck supple.  Cardiovascular: Normal rate, regular rhythm, normal heart sounds and intact distal pulses.   Pulmonary/Chest: Effort normal. He has decreased breath sounds (somewhat diminished air movement bilaterally). He has no wheezes. He has no rhonchi. He has no rales. He exhibits no bony tenderness.  Abdominal: Soft. Normal appearance. He exhibits no mass. There is tenderness (mild left lateral abdominal tenderness). There is no rebound.  Musculoskeletal: Normal range of motion.  Neurological: He is alert and oriented to person, place, and time. No cranial nerve deficit or sensory deficit. He exhibits normal muscle tone. Coordination normal.  Skin: Skin is warm, dry and intact.  Psychiatric: He has a normal mood and affect. His behavior is normal. Judgment and thought content normal.     ED Course  Procedures (including critical care time)  Medications  HYDROmorphone (DILAUDID) injection 1 mg (1 mg Intravenous Given 08/10/13 0926)  ondansetron (ZOFRAN) injection 4 mg (4 mg Intravenous Given 08/10/13 0926)    Patient Vitals for the past 24 hrs:  BP Temp Temp src Pulse Resp SpO2 Height Weight  08/10/13 1049 112/82 mmHg 98 F (36.7 C) Oral 80 18 99 % - -  08/10/13 0830 124/76 mmHg 98.1 F (36.7 C) Oral 93 22 100 % 6\' 1"  (1.854 m) 178 lb (80.74 kg)    DIAGNOSTIC STUDIES: Oxygen Saturation is 100% on room air, normal by my interpretation.    COORDINATION OF CARE: 9:05 AM-Discussed treatment plan which includes pain medication, CT abdomen, CXR, and UA with pt at bedside and pt agreed to plan.   11:13 AM-Informed pt that imaging reveals stones but they are not likely to be an issue currently and are not likely the cause of symptoms.  Informed pt that it also reveals a moderate amount of stool.  Pt confirms he has felt constipated recently.  Discussed treatment plan which includes magnesium citrate and Colace with pt at bedside and pt agreed to plan.     Labs Review Labs Reviewed  URINALYSIS, ROUTINE W REFLEX MICROSCOPIC    Imaging Review Ct Abdomen Pelvis Wo Contrast  08/10/2013   CLINICAL DATA:  Left flank pain and history of kidney stones.  EXAM: CT ABDOMEN AND PELVIS WITHOUT CONTRAST  TECHNIQUE: Multidetector CT imaging of the abdomen and pelvis was performed following the standard protocol without intravenous contrast.  COMPARISON:  08/16/2009  FINDINGS: The lung bases are clear.  There is no evidence for free air.  3 mm stone in the right kidney mid pole without hydronephrosis. No evidence for a right ureter stone. Normal appearance of the urinary bladder. There is a subtle 3 mm stone in the left mid/lower pole region. No evidence for left hydronephrosis or left ureter stones.  Normal appearance of the adrenal glands, liver, gallbladder, spleen and pancreas. Normal  appearance of the stomach and duodenum. There is no significant free fluid or lymphadenopathy. No gross abnormality to the prostate or seminal vesicles. Moderate amount of stool in the colon. The appendix contains high density material but no evidence for acute inflammation. Normal appearance of the small bowel structures. No acute bone abnormality.  IMPRESSION: Bilateral nonobstructive kidney stones. No evidence for ureter stones.   Electronically Signed   By: Richarda Overlie M.D.   On: 08/10/2013 10:12   Dg Chest 2 View  08/10/2013   CLINICAL DATA:  Chest pain  EXAM: CHEST  2 VIEW  COMPARISON:  10/24/2009  FINDINGS: The heart size and  mediastinal contours are within normal limits. Both lungs are clear. The visualized skeletal structures are unremarkable.  IMPRESSION: No active cardiopulmonary disease.   Electronically Signed   By: Alcide Clever M.D.   On: 08/10/2013 09:54      MDM   1. Abdominal pain   2. Constipation    Nonspecific abdominal pain with reassuring ED evaluation. Renal stones, non-obstructive. No apparent UTI, metabolic instability or SBI.  Nursing Notes Reviewed/ Care Coordinated Applicable Imaging Reviewed Interpretation of Laboratory Data incorporated into ED treatment  The patient appears reasonably screened and/or stabilized for discharge and I doubt any other medical condition or other [facility] requiring further screening, evaluation, or treatment in the ED at this time prior to discharge.  Plan: Home Medications- Percocet, Magnesium Citrate, Colace; Home Treatments- rest, fluids; return here if the recommended treatment, does not improve the symptoms; Recommended follow up- PCP of choice prn     I personally performed the services described in this documentation, which was scribed in my presence. The recorded information has been reviewed and is accurate.   `  Flint Melter, MD 08/10/13 [phone]

## 2013-08-10 NOTE — Progress Notes (Signed)
ED/CM noted patient did not have health insurance and/or PCP listed in the computer.  [facility] [place] [facility] with information on the clinics, food pantries, and the handout for new health insurance sign-up.  Patient expressed appreciation for information received.

## 2013-08-10 NOTE — ED Notes (Signed)
Pt c/o left sided abdominal pain that starts in left abd and radiates to upper chest into left arm x 1 month; pt states the pain is worse with deep breath sometimes; pt states he has had some nausea with the pain

## 2013-08-10 NOTE — ED Notes (Signed)
Pt called out and asking how much longer; said nurse went in to give patient an update, doctor will be in as soon as he is available.  Patient verbalized understanding

## 2013-08-10 NOTE — Discharge Instructions (Signed)
Use magnesium citrate, one half bottle today, and one half tomorrow. After that, use Colace 100 mg twice a day for one month. This treatment, should help your constipation. Try to avoid using the narcotic pain medicine, as it will worsen, constipation. There are stones in both of your kidneys that do not appear to be causing a problem today. See the doctor of your choice as needed for problems.    Abdominal Pain, Adult Many things can cause abdominal pain. Usually, abdominal pain is not caused by a disease and will improve without treatment. It can often be observed and treated at home. Your health care provider will do a physical exam and possibly order blood tests and X-rays to help determine the seriousness of your pain. However, in many cases, more time must pass before a clear cause of the pain can be found. Before that point, your health care provider may not know if you need more testing or further treatment. HOME CARE INSTRUCTIONS  Monitor your abdominal pain for any changes. The following actions may help to alleviate any discomfort you are experiencing:  Only take over-the-counter or prescription medicines as directed by your health care provider.  Do not take laxatives unless directed to do so by your health care provider.  Try a clear liquid diet (broth, tea, or water) as directed by your health care provider. Slowly move to a bland diet as tolerated. SEEK MEDICAL CARE IF:  You have unexplained abdominal pain.  You have abdominal pain associated with nausea or diarrhea.  You have pain when you urinate or have a bowel movement.  You experience abdominal pain that wakes you in the night.  You have abdominal pain that is worsened or improved by eating food.  You have abdominal pain that is worsened with eating fatty foods. SEEK IMMEDIATE MEDICAL CARE IF:   Your pain does not go away within 2 hours.  You have a fever.  You keep throwing up (vomiting).  Your pain is  felt only in portions of the abdomen, such as the right side or the left lower portion of the abdomen.  You pass bloody or black tarry stools. MAKE SURE YOU:  Understand these instructions.   Will watch your condition.   Will get help right away if you are not doing well or get worse.  Document Released: 03/29/2005 Document Revised: 04/09/2013 Document Reviewed: 02/26/2013 [facility] Patient Information 2014 [place], [place].  Constipation, Adult Constipation is when a person has fewer than 3 bowel movements a week; has difficulty having a bowel movement; or has stools that are dry, hard, or larger than normal. As people grow older, constipation is more common. If you try to fix constipation with medicines that make you have a bowel movement (laxatives), the problem may get worse. Long-term laxative use may cause the muscles of the colon to become weak. A low-fiber diet, not taking in enough fluids, and taking certain medicines may make constipation worse. CAUSES   Certain medicines, such as antidepressants, pain medicine, iron supplements, antacids, and water pills.   Certain diseases, such as diabetes, irritable bowel syndrome (IBS), thyroid disease, or depression.   Not drinking enough water.   Not eating enough fiber-rich foods.   Stress or travel.  Lack of physical activity or exercise.  Not going to the restroom when there is the urge to have a bowel movement.  Ignoring the urge to have a bowel movement.  Using laxatives too much. SYMPTOMS   Having fewer than 3  bowel movements a week.   Straining to have a bowel movement.   Having hard, dry, or larger than normal stools.   Feeling full or bloated.   Pain in the lower abdomen.  Not feeling relief after having a bowel movement. DIAGNOSIS  Your caregiver will take a medical history and perform a physical exam. Further testing may be done for severe constipation. Some tests may include:   A barium enema  X-ray to examine your rectum, colon, and sometimes, your small intestine.  A sigmoidoscopy to examine your lower colon.  A colonoscopy to examine your entire colon. TREATMENT  Treatment will depend on the severity of your constipation and what is causing it. Some dietary treatments include drinking more fluids and eating more fiber-rich foods. Lifestyle treatments may include regular exercise. If these diet and lifestyle recommendations do not help, your caregiver may recommend taking over-the-counter laxative medicines to help you have bowel movements. Prescription medicines may be prescribed if over-the-counter medicines do not work.  HOME CARE INSTRUCTIONS   Increase dietary fiber in your diet, such as fruits, vegetables, whole grains, and beans. Limit high-fat and processed sugars in your diet, such as [place] fries, hamburgers, cookies, candies, and soda.   A fiber supplement may be added to your diet if you cannot get enough fiber from foods.   Drink enough fluids to keep your urine clear or pale yellow.   Exercise regularly or as directed by your caregiver.   Go to the restroom when you have the urge to go. Do not hold it.  Only take medicines as directed by your caregiver. Do not take other medicines for constipation without talking to your caregiver first. SEEK IMMEDIATE MEDICAL CARE IF:   You have bright red blood in your stool.   Your constipation lasts for more than 4 days or gets worse.   You have abdominal or rectal pain.   You have thin, pencil-like stools.  You have unexplained weight loss. MAKE SURE YOU:   Understand these instructions.  Will watch your condition.  Will get help right away if you are not doing well or get worse. Document Released: 03/17/2004 Document Revised: 09/11/2011 Document Reviewed: 03/31/2013 [facility] Patient Information 2014 [place], [place].  Fiber Content in Foods Drinking plenty of fluids and consuming foods high in fiber can  help with constipation. See the list below for the fiber content of some common foods. Starches and Grains / Dietary Fiber (g)  Cheerios, 1 cup / 3 g  Kellogg's Corn Flakes, 1 cup / 0.7 g  Rice Krispies, 1  cup / 0.3 g  Quaker Oat Life Cereal,  cup / 2.1 g  Oatmeal, instant (cooked),  cup / 2 g  Kellogg's Frosted Mini Wheats, 1 cup / 5.1 g  Rice, brown, long-grain (cooked), 1 cup / 3.5 g  Rice, white, long-grain (cooked), 1 cup / 0.6 g  Macaroni, cooked, enriched, 1 cup / 2.5 g Legumes / Dietary Fiber (g)  Beans, baked, canned, plain or vegetarian,  cup / 5.2 g  Beans, kidney, canned,  cup / 6.8 g  Beans, pinto, dried (cooked),  cup / 7.7 g  Beans, pinto, canned,  cup / 5.5 g Breads and Crackers / Dietary Fiber (g)  Graham crackers, plain or honey, 2 squares / 0.7 g  Saltine crackers, 3 squares / 0.3 g  Pretzels, plain, salted, 10 pieces / 1.8 g  Bread, whole-wheat, 1 slice / 1.9 g  Bread, white, 1 slice / 0.7 g  Bread, raisin, 1 slice / 1.2 g  Bagel, plain, 3 oz / 2 g  Tortilla, flour, 1 oz / 0.9 g  Tortilla, corn, 1 small / 1.5 g  Bun, hamburger or hotdog, 1 small / 0.9 g Fruits / Dietary Fiber (g)  Apple, raw with skin, 1 medium / 4.4 g  Applesauce, sweetened,  cup / 1.5 g  Banana,  medium / 1.5 g  Grapes, 10 grapes / 0.4 g  Orange, 1 small / 2.3 g  Raisin, 1.5 oz / 1.6 g  Melon, 1 cup / 1.4 g Vegetables / Dietary Fiber (g)  Green beans, canned,  cup / 1.3 g  Carrots (cooked),  cup / 2.3 g  Broccoli (cooked),  cup / 2.8 g  Peas, frozen (cooked),  cup / 4.4 g  Potatoes, mashed,  cup / 1.6 g  Lettuce, 1 cup / 0.5 g  Corn, canned,  cup / 1.6 g  Tomato,  cup / 1.1 g Document Released: 11/05/2006 Document Revised: 09/11/2011 Document Reviewed: 12/31/2006 [employer] Patient Information 2014 [place]

## 2013-08-10 NOTE — ED Notes (Signed)
Dr. Effie ShyWentz in with patient at this time

## 2013-08-19 ENCOUNTER — Encounter (HOSPITAL_COMMUNITY): Payer: Self-pay | Admitting: Emergency Medicine

## 2013-08-19 ENCOUNTER — Emergency Department (HOSPITAL_COMMUNITY): Payer: Self-pay

## 2013-08-19 ENCOUNTER — Emergency Department (HOSPITAL_COMMUNITY)
Admission: EM | Admit: 2013-08-19 | Discharge: 2013-08-19 | Disposition: A | Discharge status: Home / Self Care | Payer: Self-pay | Attending: Emergency Medicine | Admitting: Emergency Medicine

## 2013-08-19 DIAGNOSIS — S2239XA Fracture of one rib, unspecified side, initial encounter for closed fracture: Secondary | ICD-10-CM | POA: Insufficient documentation

## 2013-08-19 DIAGNOSIS — Z8659 Personal history of other mental and behavioral disorders: Secondary | ICD-10-CM | POA: Insufficient documentation

## 2013-08-19 DIAGNOSIS — Z91040 Latex allergy status: Secondary | ICD-10-CM | POA: Insufficient documentation

## 2013-08-19 DIAGNOSIS — Z87448 Personal history of other diseases of urinary system: Secondary | ICD-10-CM | POA: Insufficient documentation

## 2013-08-19 DIAGNOSIS — IMO0002 Reserved for concepts with insufficient information to code with codable children: Secondary | ICD-10-CM | POA: Insufficient documentation

## 2013-08-19 DIAGNOSIS — F17200 Nicotine dependence, unspecified, uncomplicated: Secondary | ICD-10-CM | POA: Insufficient documentation

## 2013-08-19 DIAGNOSIS — Y929 Unspecified place or not applicable: Secondary | ICD-10-CM | POA: Insufficient documentation

## 2013-08-19 DIAGNOSIS — Z8619 Personal history of other infectious and parasitic diseases: Secondary | ICD-10-CM | POA: Insufficient documentation

## 2013-08-19 DIAGNOSIS — W010XXA Fall on same level from slipping, tripping and stumbling without subsequent striking against object, initial encounter: Secondary | ICD-10-CM | POA: Insufficient documentation

## 2013-08-19 DIAGNOSIS — Y9389 Activity, other specified: Secondary | ICD-10-CM | POA: Insufficient documentation

## 2013-08-19 MED ORDER — HYDROCODONE-ACETAMINOPHEN 5-325 MG PO TABS
2.0000 | ORAL_TABLET | Freq: Once | ORAL | Status: AC
  Administered 2013-08-19: 2 via ORAL
  Filled 2013-08-19: qty 2

## 2013-08-19 MED ORDER — HYDROCODONE-ACETAMINOPHEN 5-325 MG PO TABS: 2.0000 | ORAL_TABLET | ORAL | Status: DC | PRN

## 2013-08-19 NOTE — ED Notes (Signed)
Patient c/o right upper and lower back pain. Per patient went outside last night and slide in the snow. Denies hitting head or LOC. CNS intact.

## 2013-08-19 NOTE — Discharge Instructions (Signed)
Ibuprofen 600 mg every 6 hours as needed for pain.  Hydrocodone as needed for pain not relieved with ibuprofen.  Return to the emergency department if you develop severe difficulty breathing or other new or concerning symptoms.   Rib Fracture A rib fracture is a break or crack in one of the bones of the ribs. The ribs are a group of long, curved bones that wrap around your chest and attach to your spine. They protect your lungs and other organs in the chest cavity. A broken or cracked rib is often painful, but most do not cause other problems. Most rib fractures heal on their own over time. However, rib fractures can be more serious if multiple ribs are broken or if broken ribs move out of place and push against other structures. CAUSES   A direct blow to the chest. For example, this could happen during contact sports, a car accident, or a fall against a hard object.  Repetitive movements with high force, such as pitching a baseball or having severe coughing spells. SYMPTOMS   Pain when you breathe in or cough.  Pain when someone presses on the injured area. DIAGNOSIS  Your caregiver will perform a physical exam. Various imaging tests may be ordered to confirm the diagnosis and to look for related injuries. These tests may include a chest X-ray, computed tomography (CT), magnetic resonance imaging (MRI), or a bone scan. TREATMENT  Rib fractures usually heal on their own in 1 3 months. The longer healing period is often associated with a continued cough or other aggravating activities. During the healing period, pain control is very important. Medication is usually given to control pain. Hospitalization or surgery may be needed for more severe injuries, such as those in which multiple ribs are broken or the ribs have moved out of place.  HOME CARE INSTRUCTIONS   Avoid strenuous activity and any activities or movements that cause pain. Be careful during activities and avoid bumping the injured  rib.  Gradually increase activity as directed by your caregiver.  Only take over-the-counter or prescription medications as directed by your caregiver. Do not take other medications without asking your caregiver first.  Apply ice to the injured area for the first 1 2 days after you have been treated or as directed by your caregiver. Applying ice helps to reduce inflammation and pain.  Put ice in a plastic bag.  Place a towel between your skin and the bag.   Leave the ice on for 15 20 minutes at a time, every 2 hours while you are awake.  Perform deep breathing as directed by your caregiver. This will help prevent pneumonia, which is a common complication of a broken rib. Your caregiver may instruct you to:  Take deep breaths several times a day.  Try to cough several times a day, holding a pillow against the injured area.  Use a device called an incentive spirometer to practice deep breathing several times a day.  Drink enough fluids to keep your urine clear or pale yellow. This will help you avoid constipation.   Do not wear a rib belt or binder. These restrict breathing, which can lead to pneumonia.  SEEK IMMEDIATE MEDICAL CARE IF:   You have a fever.   You have difficulty breathing or shortness of breath.   You develop a continual cough, or you cough up thick or bloody sputum.  You feel sick to your stomach (nausea), throw up (vomit), or have abdominal pain.   You  have worsening pain not controlled with medications.  MAKE SURE YOU:  Understand these instructions.  Will watch your condition.  Will get help right away if you are not doing well or get worse. Document Released: 06/19/2005 Document Revised: 02/19/2013 Document Reviewed: 08/21/2012 [facility] 2014 [place], [place].

## 2013-08-19 NOTE — ED Provider Notes (Signed)
CSN: [id number]     Arrival date & time 08/19/13  1046 History   First MD Initiated Contact with Patient 08/19/13 1056     Chief Complaint  Patient presents with  . Fall     (Consider location/radiation/quality/duration/timing/severity/associated sxs/prior Treatment) HPI Comments: Patient is a 33 year old male with history of bipolar disorder. He presents with complaints of pain in his right posterior ribs since falling yesterday evening. He states he went out to his car and slipped on ice and injured his right upper back. His pain is worse with breathing but denies shortness of breath. There is no radiation into his legs and there are no bowel or bladder complaints. He denies any hematuria.  Patient is a 33 y.o. male presenting with fall. The history is provided by the patient.  Fall This is a new problem. Episode onset: Yesterday evening. The problem occurs constantly. The problem has not changed since onset.Associated symptoms include chest pain. Nothing aggravates the symptoms. Nothing relieves the symptoms. He has tried nothing for the symptoms. The treatment provided no relief.    Past Medical History  Diagnosis Date  . Bipolar 1 disorder   . Schizo affective schizophrenia   . Testicular cyst   . Back pain   . Hepatitis C    History reviewed. No pertinent past surgical history. Family History  Problem Relation Age of Onset  . Stroke Father   . Hypertension Father    History  Substance Use Topics  . Smoking status: Current Every Day Smoker -- 1.00 packs/day for 15 years    Types: Cigarettes  . Smokeless tobacco: Never Used  . Alcohol Use: No    Review of Systems  Cardiovascular: Positive for chest pain.  All other systems reviewed and are negative.      Allergies  Latex  Home Medications   Current Outpatient Rx  Name  Route  Sig  Dispense  Refill  . ibuprofen (ADVIL,MOTRIN) 800 MG tablet   Oral   Take 800 mg by mouth every 8 (eight) hours as needed for  moderate pain.         . Multiple Vitamin (MULTIVITAMIN WITH MINERALS) TABS tablet   Oral   Take 1 tablet by mouth daily.         Marland Kitchen. oxyCODONE (OXY IR/ROXICODONE) 5 MG immediate release tablet   Oral   Take 5 mg by mouth every 4 (four) hours as needed for severe pain.         Marland Kitchen. oxyCODONE-acetaminophen (PERCOCET) 5-325 MG per tablet   Oral   Take 1 tablet by mouth every 4 (four) hours as needed.   10 tablet   0    BP 137/82  Pulse 90  Temp(Src) 97.7 F (36.5 C) (Oral)  Resp 16  Ht 6\' 1"  (1.854 m)  Wt 178 lb (80.74 kg)  BMI 23.49 kg/m2  SpO2 98% Physical Exam  Nursing note and vitals reviewed. Constitutional: He is oriented to person, place, and time. He appears well-developed and well-nourished. No distress.  HENT:  Head: Normocephalic and atraumatic.  Neck: Normal range of motion. Neck supple.  Cardiovascular: Normal rate, regular rhythm and normal heart sounds.   No murmur heard. Pulmonary/Chest: Effort normal and breath sounds normal. No respiratory distress. He has no wheezes. He has no rales. He exhibits tenderness.  There is tenderness to palpation of the right lateral and posterior ribs. There is no crepitus. Breath sounds are clear and equal bilaterally.  Abdominal: Soft. Bowel sounds are normal.  Neurological: He is alert and oriented to person, place, and time.  Skin: Skin is warm and dry. He is not diaphoretic.    ED Course  Procedures (including critical care time) Labs Review Labs Reviewed - No data to display Imaging Review No results found.    MDM   Final diagnoses:  None    Patient presents here with complaints of pain in the right ribs after a fall. X-rays reveal what appears to be a nondisplaced fracture of the 10th rib. There is no pneumothorax and he does not appear to be in any distress. He will be given pain medication discharge to home. He understands to return if he worsens.    Geoffery Lyons, MD 08/19/13 [phone]

## 2013-08-19 NOTE — ED Notes (Signed)
Patient given discharge instruction, verbalized understand. Patient ambulatory out of the department.  

## 2013-08-20 ENCOUNTER — Encounter (HOSPITAL_COMMUNITY): Payer: Self-pay | Admitting: Emergency Medicine

## 2013-08-20 ENCOUNTER — Emergency Department (HOSPITAL_COMMUNITY)
Admission: EM | Admit: 2013-08-20 | Discharge: 2013-08-20 | Disposition: A | Discharge status: Home / Self Care | Payer: Self-pay | Attending: Emergency Medicine | Admitting: Emergency Medicine

## 2013-08-20 DIAGNOSIS — F411 Generalized anxiety disorder: Secondary | ICD-10-CM | POA: Insufficient documentation

## 2013-08-20 DIAGNOSIS — IMO0001 Reserved for inherently not codable concepts without codable children: Secondary | ICD-10-CM | POA: Insufficient documentation

## 2013-08-20 DIAGNOSIS — Z8619 Personal history of other infectious and parasitic diseases: Secondary | ICD-10-CM | POA: Insufficient documentation

## 2013-08-20 DIAGNOSIS — S2231XA Fracture of one rib, right side, initial encounter for closed fracture: Secondary | ICD-10-CM

## 2013-08-20 DIAGNOSIS — M549 Dorsalgia, unspecified: Secondary | ICD-10-CM | POA: Insufficient documentation

## 2013-08-20 DIAGNOSIS — Z8659 Personal history of other mental and behavioral disorders: Secondary | ICD-10-CM | POA: Insufficient documentation

## 2013-08-20 DIAGNOSIS — Z91040 Latex allergy status: Secondary | ICD-10-CM | POA: Insufficient documentation

## 2013-08-20 DIAGNOSIS — G8911 Acute pain due to trauma: Secondary | ICD-10-CM | POA: Insufficient documentation

## 2013-08-20 DIAGNOSIS — Z87448 Personal history of other diseases of urinary system: Secondary | ICD-10-CM | POA: Insufficient documentation

## 2013-08-20 DIAGNOSIS — R109 Unspecified abdominal pain: Secondary | ICD-10-CM

## 2013-08-20 DIAGNOSIS — F17200 Nicotine dependence, unspecified, uncomplicated: Secondary | ICD-10-CM | POA: Insufficient documentation

## 2013-08-20 MED ORDER — MELOXICAM 7.5 MG PO TABS: ORAL_TABLET | ORAL | Status: DC

## 2013-08-20 MED ORDER — BACLOFEN 10 MG PO TABS: 10.0000 mg | ORAL_TABLET | Freq: Three times a day (TID) | ORAL | Status: DC

## 2013-08-20 NOTE — ED Notes (Signed)
Pt reports was seen here yesterday and diagnosed with rib fracture.  Pt reports pain medication is not helping.   Pt says was given hydrocodone.  Denies any SOB or difficulty breathing.

## 2013-08-20 NOTE — ED Provider Notes (Signed)
CSN: [id number]     Arrival date & time 08/20/13  1635 History   First MD Initiated Contact with Patient 08/20/13 1728     Chief Complaint  Patient presents with  . rib fracture      (Consider location/radiation/quality/duration/timing/severity/associated sxs/prior Treatment) HPI Comments: Patient presents to the emergency department with complaint of continued rib area pain. The patient states that he was here in the emergency department on yesterday February 17 plan he was diagnosed with 10th rib fracture of the right side. The patient states that he was treated with hydrocodone, but states this has not helped and he feels that the pain is getting worse. He denies any hemoptysis. He denies any high fever. He denies any unusual shortness of breath. He states it does hurt to breathe and to make certain movements. He presents to the emergency department for additional evaluation and for assistance with his pain.  The history is provided by the patient.    Past Medical History  Diagnosis Date  . Bipolar 1 disorder   . Schizo affective schizophrenia   . Testicular cyst   . Back pain   . Hepatitis C    History reviewed. No pertinent past surgical history. Family History  Problem Relation Age of Onset  . Stroke Father   . Hypertension Father    History  Substance Use Topics  . Smoking status: Current Every Day Smoker -- 1.00 packs/day for 15 years    Types: Cigarettes  . Smokeless tobacco: Never Used  . Alcohol Use: No    Review of Systems  Constitutional: Negative for activity change.       All ROS Neg except as noted in HPI  HENT: Negative for nosebleeds.   Eyes: Negative for photophobia and discharge.  Respiratory: Negative for cough, shortness of breath and wheezing.   Cardiovascular: Negative for chest pain and palpitations.  Gastrointestinal: Negative for abdominal pain and blood in stool.  Genitourinary: Negative for dysuria, frequency and hematuria.  Musculoskeletal:  Positive for back pain. Negative for arthralgias and neck pain.  Skin: Negative.   Neurological: Negative for dizziness, seizures and speech difficulty.  Psychiatric/Behavioral: Negative for hallucinations and confusion. The patient is nervous/anxious.       Allergies  Latex  Home Medications   Current Outpatient Rx  Name  Route  Sig  Dispense  Refill  . baclofen (LIORESAL) 10 MG tablet   Oral   Take 1 tablet (10 mg total) by mouth 3 (three) times daily.   21 each   0   . HYDROcodone-acetaminophen (NORCO) 5-325 MG per tablet   Oral   Take 2 tablets by mouth every 4 (four) hours as needed.   20 tablet   0   . ibuprofen (ADVIL,MOTRIN) 800 MG tablet   Oral   Take 800 mg by mouth every 8 (eight) hours as needed for moderate pain.         . meloxicam (MOBIC) 7.5 MG tablet      1 po bid with food   14 tablet   0   . Multiple Vitamin (MULTIVITAMIN WITH MINERALS) TABS tablet   Oral   Take 1 tablet by mouth daily.         Marland Kitchen. oxyCODONE (OXY IR/ROXICODONE) 5 MG immediate release tablet   Oral   Take 5 mg by mouth every 4 (four) hours as needed for severe pain.         Marland Kitchen. oxyCODONE-acetaminophen (PERCOCET) 5-325 MG per tablet   Oral  Take 1 tablet by mouth every 4 (four) hours as needed.   10 tablet   0    BP 113/99  Pulse 84  Temp(Src) 98.6 F (37 C) (Oral)  Resp 18  Ht 6\' 1"  (1.854 m)  Wt 178 lb (80.74 kg)  BMI 23.49 kg/m2  SpO2 99% Physical Exam  Nursing note and vitals reviewed. Constitutional: He is oriented to person, place, and time. He appears well-developed and well-nourished.  Non-toxic appearance.  HENT:  Head: Normocephalic.  Right Ear: Tympanic membrane and external ear normal.  Left Ear: Tympanic membrane and external ear normal.  Eyes: EOM and lids are normal. Pupils are equal, round, and reactive to light.  Neck: Normal range of motion. Neck supple. Carotid bruit is not present.  Cardiovascular: Normal rate, regular rhythm, normal heart  sounds, intact distal pulses and normal pulses.   Pulmonary/Chest: Breath sounds normal. No respiratory distress. He has no wheezes. He has no rales. He exhibits tenderness.  To palpation and movement. No palpable deformity of the rib area. No hematoma noted. No bruising appreciated.  Abdominal: Soft. Bowel sounds are normal. There is no tenderness. There is no guarding.  Musculoskeletal: Normal range of motion.  Lymphadenopathy:       Head (right side): No submandibular adenopathy present.       Head (left side): No submandibular adenopathy present.    He has no cervical adenopathy.  Neurological: He is alert and oriented to person, place, and time. He has normal strength. No cranial nerve deficit or sensory deficit.  Skin: Skin is warm and dry.  Psychiatric: He has a normal mood and affect. His speech is normal.    ED Course  Procedures (including critical care time) Labs Review Labs Reviewed - No data to display Imaging Review Dg Ribs Unilateral W/chest Right  08/19/2013   CLINICAL DATA:  Pain post trauma  EXAM: RIGHT RIBS AND CHEST - 3+ VIEW  COMPARISON:  Chest radiograph August 10, 2013  FINDINGS: Frontal chest as well as bilateral oblique and cone-down lower rib images were obtained. There is a nondisplaced fracture of the anterior right tenth rib. No pneumothorax or effusion. Lungs clear. Heart size and pulmonary vascularity are normal. No adenopathy.  IMPRESSION: Lungs clear. No pneumothorax. Nondisplaced fracture anterior right tenth rib.   Electronically Signed   By: Bretta Bang M.D.   On: 08/19/2013 11:52    EKG Interpretation   None       MDM Patient was noted to have a right 10th rib fracture on x-rays performed February 17. I have reviewed the x-rays. I have also reviewed the emergency department records. The examination today shows a stable vital signs. The patient is a pulse ox that is within normal limits. He is able to speak in complete sentences without  problem.    Final diagnoses:  None    *I have reviewed nursing notes, vital signs, and all appropriate lab and imaging results for this patient.** The plan at this time is to add Mobic 7.5 mg twice a day and baclofen 3 times daily to the current medications. Patient strongly encouraged to cough and deep breathing frequently. He is in coverage to see his primary physician for additional evaluation and management of this problem.  Kathie Dike, PA-C 08/21/13 1626

## 2013-08-20 NOTE — Discharge Instructions (Signed)
Please please cough and deep breathe frequently, use a pillow and splint the fracture rib area. Please add baclofen 3 times daily, and Mobic 2 times daily to your current medications. Please see your primary care physician for additional evaluation and management.

## 2013-08-24 NOTE — ED Provider Notes (Signed)
Medical screening examination/treatment/procedure(s) were performed by non-physician practitioner and as supervising physician I was immediately available for consultation/collaboration.  EKG Interpretation   None        Donnetta HutchingBrian Aaima Gaddie, MD 08/24/13 2046

## 2013-08-27 ENCOUNTER — Emergency Department (HOSPITAL_COMMUNITY): Payer: Self-pay

## 2013-08-27 ENCOUNTER — Encounter (HOSPITAL_COMMUNITY): Payer: Self-pay | Admitting: Emergency Medicine

## 2013-08-27 ENCOUNTER — Emergency Department (HOSPITAL_COMMUNITY)
Admission: EM | Admit: 2013-08-27 | Discharge: 2013-08-27 | Disposition: A | Discharge status: Home / Self Care | Payer: Self-pay | Attending: Emergency Medicine | Admitting: Emergency Medicine

## 2013-08-27 DIAGNOSIS — X500XXA Overexertion from strenuous movement or load, initial encounter: Secondary | ICD-10-CM | POA: Insufficient documentation

## 2013-08-27 DIAGNOSIS — Z8619 Personal history of other infectious and parasitic diseases: Secondary | ICD-10-CM | POA: Insufficient documentation

## 2013-08-27 DIAGNOSIS — F17200 Nicotine dependence, unspecified, uncomplicated: Secondary | ICD-10-CM | POA: Insufficient documentation

## 2013-08-27 DIAGNOSIS — Z8659 Personal history of other mental and behavioral disorders: Secondary | ICD-10-CM | POA: Insufficient documentation

## 2013-08-27 DIAGNOSIS — Z87442 Personal history of urinary calculi: Secondary | ICD-10-CM | POA: Insufficient documentation

## 2013-08-27 DIAGNOSIS — Z87448 Personal history of other diseases of urinary system: Secondary | ICD-10-CM | POA: Insufficient documentation

## 2013-08-27 DIAGNOSIS — W010XXA Fall on same level from slipping, tripping and stumbling without subsequent striking against object, initial encounter: Secondary | ICD-10-CM | POA: Insufficient documentation

## 2013-08-27 DIAGNOSIS — IMO0002 Reserved for concepts with insufficient information to code with codable children: Secondary | ICD-10-CM | POA: Insufficient documentation

## 2013-08-27 DIAGNOSIS — Y9289 Other specified places as the place of occurrence of the external cause: Secondary | ICD-10-CM | POA: Insufficient documentation

## 2013-08-27 DIAGNOSIS — R300 Dysuria: Secondary | ICD-10-CM | POA: Insufficient documentation

## 2013-08-27 DIAGNOSIS — R110 Nausea: Secondary | ICD-10-CM | POA: Insufficient documentation

## 2013-08-27 DIAGNOSIS — R109 Unspecified abdominal pain: Secondary | ICD-10-CM | POA: Insufficient documentation

## 2013-08-27 DIAGNOSIS — Z791 Long term (current) use of non-steroidal anti-inflammatories (NSAID): Secondary | ICD-10-CM | POA: Insufficient documentation

## 2013-08-27 DIAGNOSIS — S8392XA Sprain of unspecified site of left knee, initial encounter: Secondary | ICD-10-CM

## 2013-08-27 DIAGNOSIS — Z91040 Latex allergy status: Secondary | ICD-10-CM | POA: Insufficient documentation

## 2013-08-27 DIAGNOSIS — Y9389 Activity, other specified: Secondary | ICD-10-CM | POA: Insufficient documentation

## 2013-08-27 DIAGNOSIS — Z79899 Other long term (current) drug therapy: Secondary | ICD-10-CM | POA: Insufficient documentation

## 2013-08-27 HISTORY — DX: Calculus of kidney: N20.0

## 2013-08-27 LAB — URINALYSIS, ROUTINE W REFLEX MICROSCOPIC
Bilirubin Urine: NEGATIVE
Glucose, UA: NEGATIVE mg/dL
Ketones, ur: NEGATIVE mg/dL
Leukocytes, UA: NEGATIVE
NITRITE: NEGATIVE
PH: 6 (ref 5.0–8.0)
Protein, ur: NEGATIVE mg/dL
Urobilinogen, UA: 0.2 mg/dL (ref 0.0–1.0)

## 2013-08-27 LAB — URINE MICROSCOPIC-ADD ON

## 2013-08-27 MED ORDER — ONDANSETRON 8 MG PO TBDP
8.0000 mg | ORAL_TABLET | Freq: Once | ORAL | Status: AC
  Administered 2013-08-27: 8 mg via ORAL
  Filled 2013-08-27: qty 1

## 2013-08-27 MED ORDER — TRAMADOL HCL 50 MG PO TABS
50.0000 mg | ORAL_TABLET | Freq: Once | ORAL | Status: AC
  Administered 2013-08-27: 50 mg via ORAL
  Filled 2013-08-27: qty 1

## 2013-08-27 MED ORDER — KETOROLAC TROMETHAMINE 60 MG/2ML IM SOLN
60.0000 mg | Freq: Once | INTRAMUSCULAR | Status: AC
  Administered 2013-08-27: 60 mg via INTRAMUSCULAR
  Filled 2013-08-27: qty 2

## 2013-08-27 MED ORDER — TRAMADOL HCL 50 MG PO TABS: 50.0000 mg | ORAL_TABLET | Freq: Four times a day (QID) | ORAL | Status: DC | PRN

## 2013-08-27 MED ORDER — IBUPROFEN 800 MG PO TABS: 800.0000 mg | ORAL_TABLET | Freq: Three times a day (TID) | ORAL | Status: DC

## 2013-08-27 MED ORDER — OXYCODONE-ACETAMINOPHEN 5-325 MG PO TABS
2.0000 | ORAL_TABLET | Freq: Once | ORAL | Status: AC
  Administered 2013-08-27: 2 via ORAL
  Filled 2013-08-27: qty 2

## 2013-08-27 NOTE — ED Notes (Signed)
Patient given instructions on how to reapply knee immobilizer when at home. Also instructed to use crutches when he is up moving around with immobilizer on. Patient stated that he understood instructions.

## 2013-08-27 NOTE — Discharge Instructions (Signed)
Wear immobilizer and use crutches as needed. Take acetaminophen, ibuprofen or naproxen as needed.   Joint Sprain A sprain is a tear or stretch in the ligaments that hold a joint together. Severe sprains may need as long as 3-6 weeks of immobilization and/or exercises to heal completely. Sprained joints should be rested and protected. If not, they can become unstable and prone to re-injury. Proper treatment can reduce your pain, shorten the period of disability, and reduce the risk of repeated injuries. TREATMENT   Rest and elevate the injured joint to reduce pain and swelling.  Apply ice packs to the injury for 20-30 minutes every 2-3 hours for the next 2-3 days.  Keep the injury wrapped in a compression bandage or splint as long as the joint is painful or as instructed by your caregiver.  Do not use the injured joint until it is completely healed to prevent re-injury and chronic instability. Follow the instructions of your caregiver.  Long-term sprain management may require exercises and/or treatment by a physical therapist. Taping or special braces may help stabilize the joint until it is completely better. SEEK MEDICAL CARE IF:   You develop increased pain or swelling of the joint.  You develop increasing redness and warmth of the joint.  You develop a fever.  It becomes stiff.  Your hand or foot gets cold or numb. Document Released: 07/27/2004 Document Revised: 09/11/2011 Document Reviewed: 07/06/2008 [facility] Patient Information 2014 [place], [place].  Tramadol tablets What is this medicine? TRAMADOL (TRA ma dole) is a pain reliever. It is used to treat moderate to severe pain in adults. This medicine may be used for other purposes; ask your health care provider or pharmacist if you have questions. COMMON BRAND NAME(S): Ultram What should I tell my health care provider before I take this medicine? They need to know if you have any of these conditions: -brain  tumor -depression -drug abuse or addiction -head injury -if you frequently drink alcohol containing drinks -kidney disease or trouble passing urine -liver disease -lung disease, asthma, or breathing problems -seizures or epilepsy -suicidal thoughts, plans, or attempt; a previous suicide attempt by you or a family member -an unusual or allergic reaction to tramadol, codeine, other medicines, foods, dyes, or preservatives -pregnant or trying to get pregnant -breast-feeding How should I use this medicine? Take this medicine by mouth with a full glass of water. Follow the directions on the prescription label. If the medicine upsets your stomach, take it with food or milk. Do not take more medicine than you are told to take. Talk to your pediatrician regarding the use of this medicine in children. Special care may be needed. Overdosage: If you think you have taken too much of this medicine contact a poison control center or emergency room at once. NOTE: This medicine is only for you. Do not share this medicine with others. What if I miss a dose? If you miss a dose, take it as soon as you can. If it is almost time for your next dose, take only that dose. Do not take double or extra doses. What may interact with this medicine? Do not take this medicine with any of the following medications: -MAOIs like Carbex, Eldepryl, Marplan, Nardil, and Parnate This medicine may also interact with the following medications: -alcohol or medicines that contain alcohol -antihistamines -benzodiazepines -bupropion -carbamazepine or oxcarbazepine -clozapine -cyclobenzaprine -digoxin -furazolidone -linezolid -medicines for depression, anxiety, or psychotic disturbances -medicines for migraine headache like almotriptan, eletriptan, frovatriptan, naratriptan, rizatriptan, sumatriptan, zolmitriptan -  medicines for pain like pentazocine, buprenorphine, butorphanol, meperidine, nalbuphine, and  propoxyphene -medicines for sleep -muscle relaxants -naltrexone -phenobarbital -phenothiazines like perphenazine, thioridazine, chlorpromazine, mesoridazine, fluphenazine, prochlorperazine, promazine, and trifluoperazine -procarbazine -warfarin This list may not describe all possible interactions. Give your health care provider a list of all the medicines, herbs, non-prescription drugs, or dietary supplements you use. Also tell them if you smoke, drink alcohol, or use illegal drugs. Some items may interact with your medicine. What should I watch for while using this medicine? Tell your doctor or health care professional if your pain does not go away, if it gets worse, or if you have new or a different type of pain. You may develop tolerance to the medicine. Tolerance means that you will need a higher dose of the medicine for pain relief. Tolerance is normal and is expected if you take this medicine for a long time. Do not suddenly stop taking your medicine because you may develop a severe reaction. Your body becomes used to the medicine. This does NOT mean you are addicted. Addiction is a behavior related to getting and using a drug for a non-medical reason. If you have pain, you have a medical reason to take pain medicine. Your doctor will tell you how much medicine to take. If your doctor wants you to stop the medicine, the dose will be slowly lowered over time to avoid any side effects. You may get drowsy or dizzy. Do not drive, use machinery, or do anything that needs mental alertness until you know how this medicine affects you. Do not stand or sit up quickly, especially if you are an older patient. This reduces the risk of dizzy or fainting spells. Alcohol can increase or decrease the effects of this medicine. Avoid alcoholic drinks. You may have constipation. Try to have a bowel movement at least every 2 to 3 days. If you do not have a bowel movement for 3 days, call your doctor or health care  professional. Your mouth may get dry. Chewing sugarless gum or sucking hard candy, and drinking plenty of water may help. Contact your doctor if the problem does not go away or is severe. What side effects may I notice from receiving this medicine? Side effects that you should report to your doctor or health care professional as soon as possible: -allergic reactions like skin rash, itching or hives, swelling of the face, lips, or tongue -breathing difficulties, wheezing -confusion -itching -light headedness or fainting spells -redness, blistering, peeling or loosening of the skin, including inside the mouth -seizures Side effects that usually do not require medical attention (report to your doctor or health care professional if they continue or are bothersome): -constipation -dizziness -drowsiness -headache -nausea, vomiting This list may not describe all possible side effects. Call your doctor for medical advice about side effects. You may report side effects to FDA at 1-800-FDA-1088. Where should I keep my medicine? Keep out of the reach of children. Store at room temperature between 15 and 30 degrees C (59 and 86 degrees F). Keep container tightly closed. Throw away any unused medicine after the expiration date. NOTE: This sheet is a summary. It may not cover all possible information. If you have questions about this medicine, talk to your doctor, pharmacist, or health care provider.  2014, Elsevier/Gold Standard. (2010-03-02 11:55:44)

## 2013-08-27 NOTE — ED Notes (Signed)
PT REQUESTING PAIN MEDS. EDP AWARE. NO ORDERS RECEIVED.

## 2013-08-27 NOTE — ED Provider Notes (Signed)
CSN: [id number]     Arrival date & time 08/27/13  0011 History   First MD Initiated Contact with Patient 08/27/13 0018     Chief Complaint  Patient presents with  . Knee Injury     (Consider location/radiation/quality/duration/timing/severity/associated sxs/prior Treatment) The history is provided by the patient.  33 year old male states that he tripped over his sandals and twisted his left knee. He is complaining of pain in the medial aspect of the knee with some radiation into his calf. He had she states that he has been having sharp pains in the same areas for several months but has not sought medical attention for them. He rates pain at 7/10. He denies other injury.  Past Medical History  Diagnosis Date  . Bipolar 1 disorder   . Schizo affective schizophrenia   . Testicular cyst   . Back pain   . Hepatitis C    History reviewed. No pertinent past surgical history. Family History  Problem Relation Age of Onset  . Stroke Father   . Hypertension Father    History  Substance Use Topics  . Smoking status: Current Every Day Smoker -- 1.00 packs/day for 15 years    Types: Cigarettes  . Smokeless tobacco: Never Used  . Alcohol Use: No    Review of Systems  All other systems reviewed and are negative.      Allergies  Latex  Home Medications   Current Outpatient Rx  Name  Route  Sig  Dispense  Refill  . baclofen (LIORESAL) 10 MG tablet   Oral   Take 1 tablet (10 mg total) by mouth 3 (three) times daily.   21 each   0   . meloxicam (MOBIC) 7.5 MG tablet      1 po bid with food   14 tablet   0   . Multiple Vitamin (MULTIVITAMIN WITH MINERALS) TABS tablet   Oral   Take 1 tablet by mouth daily.         Marland Kitchen. HYDROcodone-acetaminophen (NORCO) 5-325 MG per tablet   Oral   Take 2 tablets by mouth every 4 (four) hours as needed.   20 tablet   0   . ibuprofen (ADVIL,MOTRIN) 800 MG tablet   Oral   Take 800 mg by mouth every 8 (eight) hours as needed for moderate  pain.         Marland Kitchen. oxyCODONE (OXY IR/ROXICODONE) 5 MG immediate release tablet   Oral   Take 5 mg by mouth every 4 (four) hours as needed for severe pain.         Marland Kitchen. oxyCODONE-acetaminophen (PERCOCET) 5-325 MG per tablet   Oral   Take 1 tablet by mouth every 4 (four) hours as needed.   10 tablet   0    BP 142/87  Pulse 88  Temp(Src) 98.2 F (36.8 C) (Oral)  Resp 22  Ht 6\' 1"  (1.854 m)  Wt 178 lb (80.74 kg)  BMI 23.49 kg/m2  SpO2 100% Physical Exam  Nursing note and vitals reviewed.  33 year old male, resting comfortably and in no acute distress. Vital signs are significant for borderline hypertension with blood pressure 142/87, and tachypnea with respiratory rate of 22. Oxygen saturation is 100%, which is normal. Head is normocephalic and atraumatic. PERRLA, EOMI. Oropharynx is clear. Neck is nontender and supple without adenopathy or JVD. Back is nontender and there is no CVA tenderness. Lungs are clear without rales, wheezes, or rhonchi. Chest is nontender. Heart has regular  rate and rhythm without murmur. Abdomen is soft, flat, nontender without masses or hepatosplenomegaly and peristalsis is normoactive. Extremities: There is no swelling or deformity of the left knee had no effusion present. There is mild tenderness palpation over the medial aspect of the knee. There is no instability on valgus or varus stress. Lachman and McMurray's tests are negative. Distal neurovascular is intact with normal sensation, strong pulses, and prompt capillary refill. Skin is warm and dry without rash. Neurologic: Mental status is normal, cranial nerves are intact, there are no motor or sensory deficits.  ED Course  Procedures (including critical care time) Imaging Review Dg Knee Complete 4 Views Left  08/27/2013   CLINICAL DATA:  33 year old male with left knee pain following fall.  EXAM: LEFT KNEE - COMPLETE 4+ VIEW  COMPARISON:  None.  FINDINGS: No evidence of acute fracture, subluxation  or dislocation identified.  No joint effusion noted.  No radio-opaque foreign bodies are present.  No focal bony lesions are noted.  The joint spaces are unremarkable.  IMPRESSION: Negative.   Electronically Signed   By: Laveda Abbe M.D.   On: 08/27/2013 01:18    MDM   Final diagnoses:  Sprain of left knee    Fall with left knee injury but no evidence of serious injury on exam. X-ray has been ordered.  X-ray is unremarkable. He is placed in knee immobilizer for comfort, given crutches to use as needed and given a prescription for tramadol. He is referred to orthopedics for followup.  Dione Booze, MD 08/27/13 [phone]

## 2013-08-27 NOTE — ED Notes (Signed)
See triage and primary notes.

## 2013-08-27 NOTE — ED Notes (Signed)
Patient got up to use bathroom and tripped over a sandal and twisted his left knee.

## 2013-08-27 NOTE — ED Notes (Signed)
Left flank pain since last night. States he thinks it is kidney stones. "stop and go" urine x 1 month. Nad. Slight grimacing noted.

## 2013-08-27 NOTE — Discharge Instructions (Signed)
Flank Pain °Flank pain is pain in your side. The flank is the area of your side between your upper belly (abdomen) and your back. Pain in this area can be caused by many different things. °HOME CARE °Home care and treatment will depend on the cause of your pain. °· Rest as told by your doctor. °· Drink enough fluids to keep your pee (urine) clear or pale yellow.   °· Only take medicine as told by your doctor. °· Tell your doctor about any changes in your pain. °· Follow up with your doctor. °GET HELP RIGHT AWAY IF:  °· Your pain does not get better with medicine.   °· You have new symptoms or your symptoms get worse. °· Your pain gets worse.   °· You have belly (abdominal) pain.   °· You are short of breath.   °· You always feel sick to your stomach (nauseous).   °· You keep throwing up (vomiting).   °· You have puffiness (swelling) in your belly.   °· You feel lightheaded or you pass out (faint).   °· You have blood in your pee. °· You have a fever or lasting symptoms for more than 2 3 days. °· You have a fever and your symptoms suddenly get worse. °MAKE SURE YOU:  °· Understand these instructions. °· Will watch your condition. °· Will get help right away if you are not doing well or get worse. °Document Released: 03/28/2008 Document Revised: 03/13/2012 Document Reviewed: 02/01/2012 °Exit[employer]® Patient Information ©2014 [employer]. ° °

## 2013-08-27 NOTE — ED Provider Notes (Signed)
CSN: [id number]     Arrival date & time 08/27/13  1008 History   First MD Initiated Contact with Patient 08/27/13 1013     Chief Complaint  Patient presents with  . Flank Pain     (Consider location/radiation/quality/duration/timing/severity/associated sxs/prior Treatment) Patient is a 33 y.o. male presenting with flank pain. The history is provided by the patient.  Flank Pain This is a recurrent problem. The current episode started today. The problem occurs constantly. The problem has been unchanged. Associated symptoms include nausea and urinary symptoms. Pertinent negatives include no abdominal pain, arthralgias, chest pain, chills, fever, headaches, joint swelling, neck pain, numbness, rash, swollen glands, vertigo, vomiting or weakness. Nothing aggravates the symptoms. He has tried nothing for the symptoms. The treatment provided no relief.   Patient c/o sudden onset of left flank pain that began early on the morning of ED arrival.  He reports hx of kidney stones and states pain feels similar to previous.  He states that he had right flank pain earlier this month and now pain has "moved to the left".  He also c/o urine hesitancy and nausea.  He denies fever, vomiting, abd pain, or pain to his testicles or swelling.    Past Medical History  Diagnosis Date  . Bipolar 1 disorder   . Schizo affective schizophrenia   . Testicular cyst   . Back pain   . Hepatitis C   . Kidney stones    History reviewed. No pertinent past surgical history. Family History  Problem Relation Age of Onset  . Stroke Father   . Hypertension Father    History  Substance Use Topics  . Smoking status: Current Every Day Smoker -- 1.00 packs/day for 15 years    Types: Cigarettes  . Smokeless tobacco: Never Used  . Alcohol Use: No    Review of Systems  Constitutional: Negative for fever, chills and appetite change.  Respiratory: Negative for shortness of breath.   Cardiovascular: Negative for chest pain.   Gastrointestinal: Positive for nausea. Negative for vomiting, abdominal pain and blood in stool.  Genitourinary: Positive for flank pain and difficulty urinating. Negative for dysuria, frequency, hematuria, decreased urine volume, penile swelling, scrotal swelling, genital sores and penile pain.  Musculoskeletal: Negative for arthralgias, back pain, joint swelling and neck pain.  Skin: Negative for color change and rash.  Neurological: Negative for dizziness, vertigo, weakness, numbness and headaches.  Hematological: Negative for adenopathy.  All other systems reviewed and are negative.      Allergies  Latex  Home Medications   Current Outpatient Rx  Name  Route  Sig  Dispense  Refill  . baclofen (LIORESAL) 10 MG tablet   Oral   Take 1 tablet (10 mg total) by mouth 3 (three) times daily.   21 each   0   . HYDROcodone-acetaminophen (NORCO) 5-325 MG per tablet   Oral   Take 2 tablets by mouth every 4 (four) hours as needed.   20 tablet   0   . ibuprofen (ADVIL,MOTRIN) 800 MG tablet   Oral   Take 800 mg by mouth every 8 (eight) hours as needed for moderate pain.         . meloxicam (MOBIC) 7.5 MG tablet      1 po bid with food   14 tablet   0   . Multiple Vitamin (MULTIVITAMIN WITH MINERALS) TABS tablet   Oral   Take 1 tablet by mouth daily.         Marland Kitchen  oxyCODONE (OXY IR/ROXICODONE) 5 MG immediate release tablet   Oral   Take 5 mg by mouth every 4 (four) hours as needed for severe pain.         Marland Kitchen oxyCODONE-acetaminophen (PERCOCET) 5-325 MG per tablet   Oral   Take 1 tablet by mouth every 4 (four) hours as needed.   10 tablet   0   . traMADol (ULTRAM) 50 MG tablet   Oral   Take 1 tablet (50 mg total) by mouth every 6 (six) hours as needed.   15 tablet   0    BP 131/75  Pulse 93  Temp(Src) 98.5 F (36.9 C) (Oral)  Resp 17  SpO2 100% Physical Exam  Nursing note and vitals reviewed. Constitutional: He is oriented to person, place, and time. He  appears well-developed and well-nourished. No distress.  HENT:  Head: Normocephalic and atraumatic.  Mouth/Throat: Oropharynx is clear and moist.  Cardiovascular: Normal rate, regular rhythm, normal heart sounds and intact distal pulses.   No murmur heard. Pulmonary/Chest: Effort normal and breath sounds normal. No respiratory distress.  Abdominal: Soft. Normal appearance and bowel sounds are normal. He exhibits no distension and no mass. There is no hepatosplenomegaly. There is tenderness. There is no rigidity, no rebound, no guarding, no CVA tenderness and no tenderness at McBurney's point.  Patient points to left flank as area of pain.  No CVA tenderness on exam.    Musculoskeletal: Normal range of motion. He exhibits no edema.  Neurological: He is alert and oriented to person, place, and time. He exhibits normal muscle tone. Coordination normal.  Skin: Skin is warm and dry.    ED Course  Procedures (including critical care time) Labs Review Labs Reviewed  URINALYSIS, ROUTINE W REFLEX MICROSCOPIC - Abnormal; Notable for the following:    Specific Gravity, Urine >1.030 (*)    Hgb urine dipstick TRACE (*)    All other components within normal limits  URINE MICROSCOPIC-ADD ON - Abnormal; Notable for the following:    Bacteria, UA FEW (*)    All other components within normal limits   Imaging Review  US Renal  08/27/2013   CLINICAL DATA:  FLANK PAIN, left hx of kidney stones FLANK PAIN, left hx of kidney stones  EXAM: RENAL/URINARY TRACT ULTRASOUND COMPLETE  COMPARISON:  CT ABD/PELV WO CM dated 08/10/2013  FINDINGS: Right Kidney:  Length: 11.3 cm. Echogenicity within normal limits. No mass or hydronephrosis visualized. Small punctate echogenic focus is appreciated within the midpole medullary portion of the right kidney consistent with patient's history of a nonobstructing medullary calculus.  Left Kidney:  Length: 13.3 cm. Echogenicity within normal limits. No mass or hydronephrosis  visualized. The small punctate area of nephrolithiasis within the left kidney described on CT is not clearly appreciated, secondary to the decreased resolution of ultrasound compared to CT. The  Bladder:  Appears normal for degree of bladder distention.  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 Otherwise unremarkable renal ultrasound.   Electronically Signed   By: Salome Holmes M.D.   On: 08/27/2013 11:37     EKG Interpretation   None       Pt seen here last evening for sprain ankle.  Given ultram.  Not currently wearing brace   MDM   Final diagnoses:  Left flank pain     Pt seen here on 08/10/13 for right flank pain and CT abd/pelvis showed Bilateral nonobstructive kidney stones. No evidence for ureteral stones.   Patient is  well appearing.  Requesting pain medication. Pain was addressed here in the dept and pt is feeling better.   Pt does not have evidence of ureteral stone from previous CT or US from today.  He was given Rx ultram last evening.  I have advised him that further rx narcotics are not indicated at this time.    Pain improved.  VSS.    The patient appears reasonably screened and/or stabilized for discharge and I doubt any other medical condition or other [facility] requiring further screening, evaluation, or treatment in the ED at this time prior to discharge.    Chelise Hanger L. Trisha Mangleriplett, PA-C 08/28/13 1441

## 2013-08-28 NOTE — ED Provider Notes (Signed)
Medical screening examination/treatment/procedure(s) were performed by non-physician practitioner and as supervising physician I was immediately available for consultation/collaboration.  EKG Interpretation   None         Kelee Cunningham M Adalene Gulotta, DO 08/28/13 1850 

## 2013-08-29 ENCOUNTER — Encounter (HOSPITAL_COMMUNITY): Payer: Self-pay | Admitting: Emergency Medicine

## 2013-08-29 ENCOUNTER — Emergency Department (HOSPITAL_COMMUNITY)
Admission: EM | Admit: 2013-08-29 | Discharge: 2013-08-29 | Disposition: A | Discharge status: Home / Self Care | Payer: Self-pay | Attending: Emergency Medicine | Admitting: Emergency Medicine

## 2013-08-29 DIAGNOSIS — M549 Dorsalgia, unspecified: Secondary | ICD-10-CM | POA: Insufficient documentation

## 2013-08-29 DIAGNOSIS — M25562 Pain in left knee: Secondary | ICD-10-CM

## 2013-08-29 DIAGNOSIS — Z87448 Personal history of other diseases of urinary system: Secondary | ICD-10-CM | POA: Insufficient documentation

## 2013-08-29 DIAGNOSIS — Z8619 Personal history of other infectious and parasitic diseases: Secondary | ICD-10-CM | POA: Insufficient documentation

## 2013-08-29 DIAGNOSIS — F17200 Nicotine dependence, unspecified, uncomplicated: Secondary | ICD-10-CM | POA: Insufficient documentation

## 2013-08-29 DIAGNOSIS — Z8659 Personal history of other mental and behavioral disorders: Secondary | ICD-10-CM | POA: Insufficient documentation

## 2013-08-29 DIAGNOSIS — F319 Bipolar disorder, unspecified: Secondary | ICD-10-CM | POA: Insufficient documentation

## 2013-08-29 DIAGNOSIS — Z91040 Latex allergy status: Secondary | ICD-10-CM | POA: Insufficient documentation

## 2013-08-29 DIAGNOSIS — Z87442 Personal history of urinary calculi: Secondary | ICD-10-CM | POA: Insufficient documentation

## 2013-08-29 DIAGNOSIS — M25569 Pain in unspecified knee: Secondary | ICD-10-CM | POA: Insufficient documentation

## 2013-08-29 NOTE — Discharge Instructions (Signed)
I have reviewed the x-rays of your knee, as well as the previous emergency department evaluations. No acute changes or findings on your current knee examinations. Because of your continued pain, it is important that you see the orthopedic specialist for additional evaluation and management. No emergent problem noted on your examination today. One [facility] may be helpful if you cannot see one of the orthopedic specialist in this area. Please continue to use your immobilizer, please elevate your knee is much as possible. Knee Pain Knee pain can be a result of an injury or other medical conditions. Treatment will depend on the cause of your pain. HOME CARE  Only take medicine as told by your doctor.  Keep a healthy weight. Being overweight can make the knee hurt more.  Stretch before exercising or playing sports.  If there is constant knee pain, change the way you exercise. Ask your doctor for advice.  Make sure shoes fit well. Choose the right shoe for the sport or activity.  Protect your knees. Wear kneepads if needed.  Rest when you are tired. GET HELP RIGHT AWAY IF:   Your knee pain does not stop.  Your knee pain does not get better.  Your knee joint feels hot to the touch.  You have a fever. MAKE SURE YOU:   Understand these instructions.  Will watch this condition.  Will get help right away if you are not doing well or get worse. Document Released: 09/15/2008 Document Revised: 09/11/2011 Document Reviewed: 09/15/2008 [facility] Patient Information 2014 [place].

## 2013-08-29 NOTE — ED Provider Notes (Signed)
Medical screening examination/treatment/procedure(s) were performed by non-physician practitioner and as supervising physician I was immediately available for consultation/collaboration.  EKG Interpretation  None    Lyanne CoKevin M Kemon Devincenzi, MD 08/29/13 [phone]

## 2013-08-29 NOTE — ED Notes (Signed)
Pt states he was diagnosed with a knee sprain two days ago. States the pain is worse today because he does not have a cast and cannot afford to get one

## 2013-08-29 NOTE — ED Provider Notes (Signed)
CSN: [id number]     Arrival date & time 08/29/13  0831 History   First MD Initiated Contact with Patient 08/29/13 [phone]     Chief Complaint  Patient presents with  . Leg Pain     (Consider location/radiation/quality/duration/timing/severity/associated sxs/prior Treatment) HPI Comments: Patient is a 33 year old male who presents to the emergency department with a complaint of knee pain. The patient was seen on February 25 with complaint of knee problems, he was evaluated, had x-rays done, and it was determined that he should be seen by one of the orthopedic specialist for additional evaluation of his knee problem, no fracture or dislocation was identified. The patient was placed in a knee immobilizer, given instructions to elevate the knee and rest the lower extremity. He was given a prescription for Ultram and told to use ibuprofen. The patient states that he has many bills, he has a toddler who has been ill recently and he has to work. He states he did not stay off of his knee, but has been working and doing a lot of walking. He has not had any new injury to his knee. Patient states that he continues to have pain, sometimes pain that keeps him up at night. He feels that the Ultram and the ibuprofen are not working. He states that he contacted the orthopedic consultant, and because of his insurance status he is unable to see the specialist at this time. He presents to the emergency department today to see if he can have a different pain medication, and for reevaluation of his knee.  Patient is a 33 y.o. male presenting with leg pain. The history is provided by the patient.  Leg Pain Location:  Knee Knee location:  L knee Associated symptoms: back pain   Associated symptoms: no neck pain     Past Medical History  Diagnosis Date  . Bipolar 1 disorder   . Schizo affective schizophrenia   . Testicular cyst   . Back pain   . Hepatitis C   . Kidney stones    History reviewed. No pertinent past  surgical history. Family History  Problem Relation Age of Onset  . Stroke Father   . Hypertension Father    History  Substance Use Topics  . Smoking status: Current Every Day Smoker -- 1.00 packs/day for 15 years    Types: Cigarettes  . Smokeless tobacco: Never Used  . Alcohol Use: No    Review of Systems  Constitutional: Negative for activity change.       All ROS Neg except as noted in HPI  HENT: Negative for nosebleeds.   Eyes: Negative for photophobia and discharge.  Respiratory: Negative for cough, shortness of breath and wheezing.   Cardiovascular: Negative for chest pain and palpitations.  Gastrointestinal: Negative for abdominal pain and blood in stool.  Genitourinary: Negative for dysuria, frequency and hematuria.  Musculoskeletal: Positive for arthralgias and back pain. Negative for neck pain.  Skin: Negative.   Neurological: Negative for dizziness, seizures and speech difficulty.  Psychiatric/Behavioral: Negative for hallucinations and confusion.      Allergies  Latex  Home Medications   Current Outpatient Rx  Name  Route  Sig  Dispense  Refill  . hydrocortisone cream 1 %   Topical   Apply 1 application topically daily as needed for itching.         Marland Kitchen ibuprofen (ADVIL,MOTRIN) 200 MG tablet   Oral   Take 800 mg by mouth every 6 (six) hours as needed for  moderate pain.         . Multiple Vitamin (MULTIVITAMIN WITH MINERALS) TABS tablet   Oral   Take 1 tablet by mouth daily.         . traMADol (ULTRAM) 50 MG tablet   Oral   Take 1 tablet (50 mg total) by mouth every 6 (six) hours as needed.   15 tablet   0    BP 138/94  Pulse 86  Temp(Src) 97.5 F (36.4 C) (Oral)  Resp 18  SpO2 99% Physical Exam  Musculoskeletal:  There is good range of motion of the right hip. There is some crepitus present with range of motion of the left knee. There is pain to medial palpation of the knee. There is no effusion present. The patella is in the midline.  There is no swelling, redness, or signs of deformity involving the anterior tibial tuberosity, nor the quadricep area. There is no mass of the posterior knee. There is no cord, swelling, redness, of the calf of the right lower extremity. The Achilles tendon is intact. The dorsalis pedis pulses 2+. There is full range of motion of the toes of the right lower extremity.    ED Course  Procedures (including critical care time) Labs Review Labs Reviewed - No data to display Imaging Review US Renal  08/27/2013   CLINICAL DATA:  FLANK PAIN, left hx of kidney stones FLANK PAIN, left hx of kidney stones  EXAM: RENAL/URINARY TRACT ULTRASOUND COMPLETE  COMPARISON:  CT ABD/PELV WO CM dated 08/10/2013  FINDINGS: Right Kidney:  Length: 11.3 cm. Echogenicity within normal limits. No mass or hydronephrosis visualized. Small punctate echogenic focus is appreciated within the midpole medullary portion of the right kidney consistent with patient's history of a nonobstructing medullary calculus.  Left Kidney:  Length: 13.3 cm. Echogenicity within normal limits. No mass or hydronephrosis visualized. The small punctate area of nephrolithiasis within the left kidney described on CT is not clearly appreciated, secondary to the decreased resolution of ultrasound compared to CT. The  Bladder:  Appears normal for degree of bladder distention.  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 Otherwise unremarkable renal ultrasound.   Electronically Signed   By: Salome Holmes M.D.   On: 08/27/2013 11:37    EKG Interpretation  None  MDM I have evaluated the previous emergency department visits. I have evaluated the x-rays of the knee done on February 25.  The patient was seen in the emergency department on February 25. And in spite of instructions to rest his knee, elevate his knee, and to see an orthopedic specialist, the patient continues to walk and work on the knee. He was given crutches, but did not  use them in coming to the emergency department today. The patient complains that his pain medication and management plan are not working, and his request is to be reevaluated, and to have more medication.  The examination does not reveal any acute problem. The vital signs are within normal limits. There is no evidence of deformity, effusion, septic joint, or neurovascular compromise in any way involving the right lower extremity.  I have advised the patient that no emergency is noted at this time. I further advised him to see the orthopedic specialist as previously advised, to use the crutches and the knee immobilizer, and to continue his current medications. The patient has requested a work note from the 25th and extended for some additional days. I have informed the patient that I cannot provide work  notes for the 25th, but I will give him a work note for today and extended to him returning on Tuesday, March 3. The patient again strongly advised to see the orthopedic specialist, or one of [facility]opedic clinics [facility].    Final diagnoses:  None    *I have reviewed nursing notes, vital signs, and all appropriate lab and imaging results for this patient.Kathie Dike**    Kerry Odonohue M Murielle Stang, PA-C 08/29/13 [phone]

## 2013-09-05 ENCOUNTER — Emergency Department (HOSPITAL_COMMUNITY)
Admission: EM | Admit: 2013-09-05 | Discharge: 2013-09-05 | Disposition: A | Discharge status: Home / Self Care | Payer: Self-pay | Attending: Emergency Medicine | Admitting: Emergency Medicine

## 2013-09-05 ENCOUNTER — Encounter (HOSPITAL_COMMUNITY): Payer: Self-pay | Admitting: Emergency Medicine

## 2013-09-05 DIAGNOSIS — X500XXA Overexertion from strenuous movement or load, initial encounter: Secondary | ICD-10-CM | POA: Insufficient documentation

## 2013-09-05 DIAGNOSIS — Z87448 Personal history of other diseases of urinary system: Secondary | ICD-10-CM | POA: Insufficient documentation

## 2013-09-05 DIAGNOSIS — Y929 Unspecified place or not applicable: Secondary | ICD-10-CM | POA: Insufficient documentation

## 2013-09-05 DIAGNOSIS — Z8659 Personal history of other mental and behavioral disorders: Secondary | ICD-10-CM | POA: Insufficient documentation

## 2013-09-05 DIAGNOSIS — Z79899 Other long term (current) drug therapy: Secondary | ICD-10-CM | POA: Insufficient documentation

## 2013-09-05 DIAGNOSIS — Z8619 Personal history of other infectious and parasitic diseases: Secondary | ICD-10-CM | POA: Insufficient documentation

## 2013-09-05 DIAGNOSIS — Z87442 Personal history of urinary calculi: Secondary | ICD-10-CM | POA: Insufficient documentation

## 2013-09-05 DIAGNOSIS — S86912A Strain of unspecified muscle(s) and tendon(s) at lower leg level, left leg, initial encounter: Secondary | ICD-10-CM

## 2013-09-05 DIAGNOSIS — Y9389 Activity, other specified: Secondary | ICD-10-CM | POA: Insufficient documentation

## 2013-09-05 DIAGNOSIS — IMO0002 Reserved for concepts with insufficient information to code with codable children: Secondary | ICD-10-CM | POA: Insufficient documentation

## 2013-09-05 DIAGNOSIS — Z91040 Latex allergy status: Secondary | ICD-10-CM | POA: Insufficient documentation

## 2013-09-05 DIAGNOSIS — F17200 Nicotine dependence, unspecified, uncomplicated: Secondary | ICD-10-CM | POA: Insufficient documentation

## 2013-09-05 DIAGNOSIS — W010XXA Fall on same level from slipping, tripping and stumbling without subsequent striking against object, initial encounter: Secondary | ICD-10-CM | POA: Insufficient documentation

## 2013-09-05 MED ORDER — PREDNISONE 10 MG PO TABS: ORAL_TABLET | ORAL | Status: DC

## 2013-09-05 MED ORDER — OXYCODONE-ACETAMINOPHEN 5-325 MG PO TABS: 1.0000 | ORAL_TABLET | ORAL | Status: DC | PRN

## 2013-09-05 MED ORDER — PREDNISONE 50 MG PO TABS
60.0000 mg | ORAL_TABLET | Freq: Once | ORAL | Status: AC
  Administered 2013-09-05: 60 mg via ORAL
  Filled 2013-09-05 (×2): qty 1

## 2013-09-05 MED ORDER — OXYCODONE-ACETAMINOPHEN 5-325 MG PO TABS
2.0000 | ORAL_TABLET | Freq: Once | ORAL | Status: AC
  Administered 2013-09-05: 2 via ORAL
  Filled 2013-09-05: qty 2

## 2013-09-05 NOTE — Discharge Instructions (Signed)
Combined Knee Ligament Sprain Combined knee ligament sprain is a tear of more than one of the major ligaments of the knee. The four knee ligaments are the anterior cruciate ligament (ACL), posterior cruciate ligament (PCL), medial collateral ligament (MCL) and lateral collateral ligament (LCL). Ligaments connect bones. They often cross a joint to hold the bones together. The ligaments of the knee keep the thigh bone (femur) and shinbone (tibia) in alignment. These ligaments allow the joint to move within a certain range of motion. Movement outside this range causes a ligament strain. Injury to multiple ligaments at the same time results in difficulty playing sports and in daily living. The most common multiple knee ligament injury involves the ACL and MCL. SYMPTOMS   A "popping" sound heard or felt at the time of injury.  Inability to continue activity after injury.  Inflammation of the knee within 6 hours after injury.  Possibly, deformity of the knee.  Inability to straighten the knee.  Feeling of the knee giving way or buckling.  Sometimes, locking of the knee, if the joint cartilage (meniscus) is injured.  Rarely, numbness, weakness, paralysis, discoloration, or coldness, due to nerve or blood vessel injury. CAUSES  Spraining of multiple ligaments occurs when a force is placed on the ligaments that exceeds their strength. This is often caused by a direct hit (trauma). It may also be caused by a non-contact injury (hyperextending the knee while twisting it).  RISK INCREASES WITH:  Contact sports (football, rugby, lacrosse). Sports that involve pivoting, jumping, cutting, or changing direction (basketball, gymnastics, soccer, volleyball). Sports on uneven ground (cross-country running, soccer).  Poor strength and/or flexibility.  Improper fitted or padded equipment. PREVENTION  Warm up and stretch properly before activity.  Maintain physical fitness:  Thigh, leg, and knee  flexibility.  Muscle strength and endurance.  Learn and use proper exercise technique.  Wear proper and well fitting equipment (correct length of cleats for surface). PROGNOSIS  Without treatment, the knee will continue to give way and become vulnerable to recurring injury. Recurring injury can happen during athletics or daily living. If the injury includes damage to a nerve or artery, the chance of a poor outcome increases. Surgery is often needed to regain stability of the knee. RELATED COMPLICATIONS  Frequently recurring symptoms, including:  Knee giving way.  Joint instability.  Inflammation.  Injury to the joint cartilage (meniscus). This may result in locking and/or swelling of the knee.  Injury to joint (articular) cartilage of the thigh bone or shinbone. This may result in arthritis of the knee.  Injury to other ligaments of the knee.  Knee stiffness (loss of knee motion).  Permanent injury to nerves (numbness, weakness, or paralysis) or arteries.  Removal (amputation) of the leg, due to nerve or artery injury. TREATMENT  Treatment first involves medicine and ice, to reduce pain and inflammation. Crutches may be advised, to decrease pain while walking. The knee may be restrained. Rehabilitation focuses on reducing swelling, regaining range of motion, and regaining muscle control and strength. It may also include receiving proper use training, wearing a brace, and education. (Avoid sports that involve pivoting, cutting, changing direction, jumping and landing). Surgery often offers the best chance for full recovery. Surgery from combined ACL/MCL injury involves replacement (reconstruction) of the ACL. This also allows for MCL healing. Despite surgery, some athletes may never return to their prior level of competition. The ability to return to sports depends on the related injuries and demands of the sport.  MEDICATION  If pain medicine is needed, nonsteroidal  anti-inflammatory medicines (aspirin and ibuprofen), or other minor pain relievers (acetaminophen), are often advised. °· Do not take pain medicine for 7 days before surgery. °· Stronger pain relievers may be prescribed. Use only as directed and only as much as you need. °· Contact your caregiver immediately if any bleeding, stomach upset, or signs of an allergic reaction occur. °COLD THERAPY  °Cold treatment (icing) should be applied for 10 to 15 minutes every 2 to 3 hours for inflammation and pain, and immediately after activity that aggravates your symptoms. Use ice packs or an ice massage. °SEEK MEDICAL CARE IF:  °· Symptoms get worse or do not improve in 6 weeks, despite treatment. °· After injury or surgery, any of the following occur: °· Pain, numbness, coldness, or a blue, gray, or dark color occurs in the foot or toenails. °· Increased pain, swelling, redness, drainage of fluids, or bleeding in the affected area. °· Signs of infection (headache, muscle aches, dizziness, or a general ill feeling with fever). °· New, unexplained symptoms develop. (Drugs used in treatment may produce side effects.) °Document Released: 06/19/2005 Document Revised: 09/11/2011 Document Reviewed: 10/01/2008 °[employer]® Patient Information ©2014 [employer]. ° °

## 2013-09-05 NOTE — ED Notes (Signed)
Pain lt knee , onset  After a fall.  Has been here for same. , says he is out of his pain meds

## 2013-09-05 NOTE — Care Management Note (Signed)
ED/CM noted patient did not have health insurance and/or PCP listed in the computer.  [place] was [place] the [facility] [place] handout with information on the clinics, food pantries, and the handout for new health insurance sign-up.  Patient expressed appreciation for information received. Pt was also given financial counselor B. Ratliff's name and number in order to apply for the [employer] grant for assistance. States he will have insurance at work in a couple of months but needs to see PCP or orthopedic  MD about his knee and have MRI now, due to continued pain in his knee.

## 2013-09-07 NOTE — ED Provider Notes (Signed)
CSN: [id number]     Arrival date & time 09/05/13  1348 History   First MD Initiated Contact with Patient 09/05/13 1436     Chief Complaint  Patient presents with  . Knee Pain     (Consider location/radiation/quality/duration/timing/severity/associated sxs/prior Treatment) HPI Comments: Nicholas Nielsen is a 33 y.o. Male presenting with ongoing pain in his left knee since he tripped and twisted the joint on 2/25.  He was seen here the day of the injury, xrays were negative for bony injury. He continues to have pain and swelling in the knee and lack of insurance has limited his ability to see an orthopedist, as he was recommended at his last visit here for this problem.  He was advised using a knee immobilizer and crutches to minimize weight bearing.  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  He is out of his pain medication.  He denies new injury, denies redness of the knee, no fevers or chills.  He does endorse nausea when his knee pain is severe.  He uses heat and elevation as much as he can without relief of pain.     The history is provided by the patient.    Past Medical History  Diagnosis Date  . Bipolar 1 disorder   . Schizo affective schizophrenia   . Testicular cyst   . Back pain   . Hepatitis C   . Kidney stones    History reviewed. No pertinent past surgical history. Family History  Problem Relation Age of Onset  . Stroke Father   . Hypertension Father    History  Substance Use Topics  . Smoking status: Current Every Day Smoker -- 1.00 packs/day for 15 years    Types: Cigarettes  . Smokeless tobacco: Never Used  . Alcohol Use: No    Review of Systems  Constitutional: Negative for fever.  Musculoskeletal: Positive for arthralgias and joint swelling. Negative for myalgias.  Neurological: Negative for weakness and numbness.      Allergies  Latex  Home Medications   Current Outpatient Rx   Name  Route  Sig  Dispense  Refill  . hydrocortisone cream 1 %   Topical   Apply 1 application topically daily as needed for itching.         Marland Kitchen ibuprofen (ADVIL,MOTRIN) 800 MG tablet   Oral   Take 800 mg by mouth every 4 (four) hours as needed for moderate pain.         . Multiple Vitamin (MULTIVITAMIN WITH MINERALS) TABS tablet   Oral   Take 1 tablet by mouth daily.         . traMADol (ULTRAM) 50 MG tablet   Oral   Take 1 tablet (50 mg total) by mouth every 6 (six) hours as needed.   15 tablet   0   . oxyCODONE-acetaminophen (PERCOCET/ROXICET) 5-325 MG per tablet   Oral   Take 1 tablet by mouth every 4 (four) hours as needed for severe pain.   20 tablet   0   . predniSONE (DELTASONE) 10 MG tablet      6, 5, 4, 3, 2 then 1 tablet by mouth daily for 6 days total.   21 tablet   0    BP 133/74  Pulse 89  Temp(Src) 98.3 F (36.8 C) (Oral)  Resp 18  Ht 6\' 1"  (1.854 m)  Wt 182 lb (82.555 kg)  BMI 24.02  kg/m2  SpO2 100% Physical Exam  Constitutional: He appears well-developed and well-nourished.  HENT:  Head: Atraumatic.  Neck: Normal range of motion.  Cardiovascular:  Pulses equal bilaterally  Musculoskeletal: He exhibits tenderness.       Left knee: He exhibits no swelling, no effusion, no ecchymosis, no deformity, no erythema, no LCL laxity, normal patellar mobility and no MCL laxity. Tenderness found. Medial joint line tenderness noted.  Crepitus with ROM of left knee.  Patella midline. Dorsalis pedis pulse intact.  Calf nontender,  Achilles intact.  ROM of left ankle and hip normal without pain.  Neurological: He is alert. He has normal strength. He displays normal reflexes. No sensory deficit.  Skin: Skin is warm and dry.  Psychiatric: He has a normal mood and affect.    ED Course  Procedures (including critical care time) Labs Review Labs Reviewed - No data to display Imaging Review No results found.   EKG Interpretation None      MDM    Final diagnoses:  Strain of left knee    xrays from 2/25 reviewed, no indication for repeat xrays today. No signs of joint infection.  Pt was given information regarding the [facility] program for hopeful eligibility for the medical assistance program for getting his knee examined further.  Pt understands that he needs an orthopedist and may need an mri of the knee if the pain does not improve with conservative tx.  Encouraged continued use of crutches, knee immobilizer, work note given for light duty for the next 3 days.      Burgess AmorJulie Madelyne Millikan, PA-C 09/07/13 2224

## 2013-09-09 NOTE — ED Provider Notes (Signed)
Medical screening examination/treatment/procedure(s) were performed by non-physician practitioner and as supervising physician I was immediately available for consultation/collaboration.   EKG Interpretation None       Nicholas HutchingBrian Takari Duncombe, MD 09/09/13 1026

## 2014-02-02 ENCOUNTER — Emergency Department (HOSPITAL_COMMUNITY)
Admission: EM | Admit: 2014-02-02 | Discharge: 2014-02-02 | Disposition: A | Discharge status: Home / Self Care | Payer: Self-pay | Attending: Emergency Medicine | Admitting: Emergency Medicine

## 2014-02-02 ENCOUNTER — Encounter (HOSPITAL_COMMUNITY): Payer: Self-pay | Admitting: Emergency Medicine

## 2014-02-02 DIAGNOSIS — Z8619 Personal history of other infectious and parasitic diseases: Secondary | ICD-10-CM | POA: Insufficient documentation

## 2014-02-02 DIAGNOSIS — Z87442 Personal history of urinary calculi: Secondary | ICD-10-CM | POA: Insufficient documentation

## 2014-02-02 DIAGNOSIS — F17200 Nicotine dependence, unspecified, uncomplicated: Secondary | ICD-10-CM | POA: Insufficient documentation

## 2014-02-02 DIAGNOSIS — Z91040 Latex allergy status: Secondary | ICD-10-CM | POA: Insufficient documentation

## 2014-02-02 DIAGNOSIS — Z87448 Personal history of other diseases of urinary system: Secondary | ICD-10-CM | POA: Insufficient documentation

## 2014-02-02 DIAGNOSIS — Z8659 Personal history of other mental and behavioral disorders: Secondary | ICD-10-CM | POA: Insufficient documentation

## 2014-02-02 DIAGNOSIS — IMO0002 Reserved for concepts with insufficient information to code with codable children: Secondary | ICD-10-CM | POA: Insufficient documentation

## 2014-02-02 DIAGNOSIS — L03317 Cellulitis of buttock: Principal | ICD-10-CM

## 2014-02-02 DIAGNOSIS — L0231 Cutaneous abscess of buttock: Secondary | ICD-10-CM | POA: Insufficient documentation

## 2014-02-02 MED ORDER — POVIDONE-IODINE 10 % EX SOLN
CUTANEOUS | Status: AC
  Filled 2014-02-02: qty 118

## 2014-02-02 MED ORDER — LIDOCAINE-EPINEPHRINE (PF) 1 %-1:200000 IJ SOLN
INTRAMUSCULAR | Status: AC
  Administered 2014-02-02
  Filled 2014-02-02: qty 10

## 2014-02-02 MED ORDER — OXYCODONE-ACETAMINOPHEN 5-325 MG PO TABS: 1.0000 | ORAL_TABLET | Freq: Four times a day (QID) | ORAL | Status: DC | PRN

## 2014-02-02 NOTE — ED Notes (Signed)
Patient complaining of an abscess on right buttock x 4 days.

## 2014-02-02 NOTE — Discharge Instructions (Signed)

## 2014-02-02 NOTE — ED Provider Notes (Signed)
CSN: [id number]     Arrival date & time 02/02/14  2104 History  This chart was scribed for Hanley SeamenJohn L Ayelet Gruenewald, MD by Milly JakobJohn Lee Graves, ED Scribe. The patient was seen in room APA12/AP[facility]12. Patient's care was started at 11:06 PM.    Chief Complaint  Patient presents with  . Abscess   The history is provided by the patient. No language interpreter was used.  HPI Comments: Nicholas Nielsen is a 33 y.o. male who presents to the Emergency Department complaining of a boil on his right interior buttock, which began four days ago. He reports that he sweats at work and believes that this is how the boil began. He reports associated drainage. He also reports diarrhea and an upset stomach. He denies abdominal pain and vomiting. Pain is moderate, worse with sitting or movement.   Past Medical History  Diagnosis Date  . Bipolar 1 disorder   . Schizo affective schizophrenia   . Testicular cyst   . Back pain   . Hepatitis C   . Kidney stones    History reviewed. No pertinent past surgical history. Family History  Problem Relation Age of Onset  . Stroke Father   . Hypertension Father    History  Substance Use Topics  . Smoking status: Current Every Day Smoker -- 1.00 packs/day for 15 years    Types: Cigarettes  . Smokeless tobacco: Never Used  . Alcohol Use: No    Review of Systems  All other systems reviewed and are negative.   Allergies  Latex  Home Medications   Prior to Admission medications   Medication Sig Start Date End Date Taking? Authorizing Provider  hydrocortisone cream 1 % Apply 1 application topically daily as needed for itching.    Historical Provider, MD  ibuprofen (ADVIL,MOTRIN) 800 MG tablet Take 800 mg by mouth every 4 (four) hours as needed for moderate pain.    Historical Provider, MD  Multiple Vitamin (MULTIVITAMIN WITH MINERALS) TABS tablet Take 1 tablet by mouth daily.    Historical Provider, MD  oxyCODONE-acetaminophen (PERCOCET) 5-325 MG per tablet Take 1-2 tablets  by mouth every 6 (six) hours as needed (for pain). 02/02/14   Carlisle BeersJohn L Ahlia Lemanski, MD  oxyCODONE-acetaminophen (PERCOCET/ROXICET) 5-325 MG per tablet Take 1 tablet by mouth every 4 (four) hours as needed for severe pain. 09/05/13   Burgess AmorJulie Idol, PA-C  predniSONE (DELTASONE) 10 MG tablet 6, 5, 4, 3, 2 then 1 tablet by mouth daily for 6 days total. 09/05/13   Burgess AmorJulie Idol, PA-C  traMADol (ULTRAM) 50 MG tablet Take 1 tablet (50 mg total) by mouth every 6 (six) hours as needed. 08/27/13   Dione Boozeavid Glick, MD   Triage Vitals: BP 135/56  Pulse 87  Temp(Src) 98.3 F (36.8 C) (Oral)  Resp 16  Ht 6\' 1"  (1.854 m)  Wt 184 lb (83.462 kg)  BMI 24.28 kg/m2  SpO2 98% Physical Exam  General: Well-developed, well-nourished male in no acute distress; appearance consistent with age of record HENT: normocephalic; atraumatic Eyes: pupils equal, round and reactive to light; extraocular muscles intact Neck: supple Heart: regular rate and rhythm Lungs: clear to auscultation bilaterally Abdomen: soft; nondistended; nontender; no masses or hepatosplenomegaly; bowel sounds present Extremities: No deformity; full range of motion; pulses normal Neurologic: Awake, alert and oriented; motor function intact in all extremities and symmetric; no facial droop Skin: Warm and dry. Pointing abscess of the right buttock 6 CM inferolateral to the anus with some surrounding erythema.  Psychiatric: Normal  mood and affect  ED Course  Procedures (including critical care time)  INCISION AND DRAINAGE Performed by: Paula Libra L Consent: Verbal consent obtained. Risks and benefits: risks, benefits and alternatives were discussed Type: abscess  Body area: Right buttock  Anesthesia: local infiltration  Incision was made with a scalpel.  Local anesthetic: lidocaine 1 % with epinephrine  Anesthetic total: 1.5 ml  Complexity: complex Blunt dissection to break up loculations  Drainage: purulent  Drainage amount: Moderate   Packing  material: 1/4 in iodoform gauze  Patient tolerance: Patient tolerated the procedure well with no immediate complications.    MDM   Final diagnoses:  Abscess of right buttock   I personally performed the services described in this documentation, which was scribed in my presence. The recorded information has been reviewed and is accurate.   Hanley Seamen, MD 02/02/14 2322

## 2014-02-08 ENCOUNTER — Emergency Department (HOSPITAL_COMMUNITY)
Admission: EM | Admit: 2014-02-08 | Discharge: 2014-02-08 | Disposition: A | Discharge status: Home / Self Care | Payer: Self-pay | Attending: Emergency Medicine | Admitting: Emergency Medicine

## 2014-02-08 ENCOUNTER — Encounter (HOSPITAL_COMMUNITY): Payer: Self-pay | Admitting: Emergency Medicine

## 2014-02-08 DIAGNOSIS — Z79899 Other long term (current) drug therapy: Secondary | ICD-10-CM | POA: Insufficient documentation

## 2014-02-08 DIAGNOSIS — Z8659 Personal history of other mental and behavioral disorders: Secondary | ICD-10-CM | POA: Insufficient documentation

## 2014-02-08 DIAGNOSIS — R51 Headache: Secondary | ICD-10-CM | POA: Insufficient documentation

## 2014-02-08 DIAGNOSIS — F17200 Nicotine dependence, unspecified, uncomplicated: Secondary | ICD-10-CM | POA: Insufficient documentation

## 2014-02-08 DIAGNOSIS — A4902 Methicillin resistant Staphylococcus aureus infection, unspecified site: Secondary | ICD-10-CM | POA: Insufficient documentation

## 2014-02-08 DIAGNOSIS — R079 Chest pain, unspecified: Secondary | ICD-10-CM | POA: Insufficient documentation

## 2014-02-08 DIAGNOSIS — F319 Bipolar disorder, unspecified: Secondary | ICD-10-CM | POA: Insufficient documentation

## 2014-02-08 DIAGNOSIS — R05 Cough: Secondary | ICD-10-CM | POA: Insufficient documentation

## 2014-02-08 DIAGNOSIS — Z87448 Personal history of other diseases of urinary system: Secondary | ICD-10-CM | POA: Insufficient documentation

## 2014-02-08 DIAGNOSIS — M79609 Pain in unspecified limb: Secondary | ICD-10-CM | POA: Insufficient documentation

## 2014-02-08 DIAGNOSIS — Z91040 Latex allergy status: Secondary | ICD-10-CM | POA: Insufficient documentation

## 2014-02-08 DIAGNOSIS — Z87442 Personal history of urinary calculi: Secondary | ICD-10-CM | POA: Insufficient documentation

## 2014-02-08 DIAGNOSIS — L089 Local infection of the skin and subcutaneous tissue, unspecified: Secondary | ICD-10-CM | POA: Insufficient documentation

## 2014-02-08 DIAGNOSIS — R059 Cough, unspecified: Secondary | ICD-10-CM | POA: Insufficient documentation

## 2014-02-08 MED ORDER — DOXYCYCLINE HYCLATE 100 MG PO CAPS: 100.0000 mg | ORAL_CAPSULE | Freq: Two times a day (BID) | ORAL | Status: DC

## 2014-02-08 MED ORDER — DOXYCYCLINE HYCLATE 100 MG PO TABS
100.0000 mg | ORAL_TABLET | Freq: Once | ORAL | Status: DC
  Filled 2014-02-08: qty 1

## 2014-02-08 NOTE — ED Provider Notes (Signed)
CSN: [id number]     Arrival date & time 02/08/14  1334 History  This chart was scribed for Vanetta Mulders, MD by Leone Payor, ED Scribe. This patient was seen in room APA02/APA02 and the patient's care was started 3:54 PM.    Chief Complaint  Patient presents with  . Recurrent Skin Infections    The history is provided by the patient. No language interpreter was used.    HPI Comments: Nicholas Nielsen is a 33 y.o. male who presents to the Emergency Department complaining of 2 days of gradual onset, gradually worsening, constant area of redness and pain to the posterior right leg. He also complains of new areas to the left and right lower leg that he noticed this morning. Patient was seen on 02/02/14 and had an abscess I&D'd on his buttocks. He denies fever.   Past Medical History  Diagnosis Date  . Bipolar 1 disorder   . Schizo affective schizophrenia   . Testicular cyst   . Back pain   . Hepatitis C   . Kidney stones    History reviewed. No pertinent past surgical history. Family History  Problem Relation Age of Onset  . Stroke Father   . Hypertension Father    History  Substance Use Topics  . Smoking status: Current Every Day Smoker -- 1.00 packs/day for 15 years    Types: Cigarettes  . Smokeless tobacco: Never Used  . Alcohol Use: No    Review of Systems  Constitutional: Negative for fever and chills.  HENT: Negative for rhinorrhea and sore throat.   Eyes: Negative for visual disturbance.  Respiratory: Positive for cough. Negative for shortness of breath.   Cardiovascular: Positive for chest pain. Negative for leg swelling.  Gastrointestinal: Negative for nausea, vomiting, abdominal pain and diarrhea.  Genitourinary: Negative for dysuria.  Musculoskeletal: Negative for back pain and neck pain.  Skin: Positive for rash (redness and pain).  Neurological: Positive for headaches.  Hematological: Does not bruise/bleed easily.  Psychiatric/Behavioral: Negative for  confusion.      Allergies  Latex  Home Medications   Prior to Admission medications   Medication Sig Start Date End Date Taking? Authorizing Provider  citalopram (CELEXA) 20 MG tablet Take 20 mg by mouth daily as needed (for mood).   Yes Historical Provider, MD  Multiple Vitamin (MULTIVITAMIN WITH MINERALS) TABS tablet Take 1 tablet by mouth daily.   Yes Historical Provider, MD  traZODone (DESYREL) 50 MG tablet Take 50-100 mg by mouth at bedtime as needed for sleep.   Yes Historical Provider, MD  doxycycline (VIBRAMYCIN) 100 MG capsule Take 1 capsule (100 mg total) by mouth 2 (two) times daily. 02/08/14   Vanetta Mulders, MD  oxyCODONE-acetaminophen (PERCOCET) 5-325 MG per tablet Take 1-2 tablets by mouth every 6 (six) hours as needed (for pain). 02/02/14   John L Molpus, MD   BP 138/80  Pulse 89  Temp(Src) 98.2 F (36.8 C) (Oral)  Resp 18  Ht 6\' 1"  (1.854 m)  Wt 184 lb (83.462 kg)  BMI 24.28 kg/m2  SpO2 100% Physical Exam  Nursing note and vitals reviewed. Constitutional: He is oriented to person, place, and time. He appears well-developed and well-nourished.  HENT:  Head: Normocephalic and atraumatic.  Cardiovascular: Normal rate, regular rhythm and normal heart sounds.   Pulmonary/Chest: Effort normal and breath sounds normal. No respiratory distress. He has no wheezes. He has no rales.  Abdominal: He exhibits no distension.  Genitourinary:  No CVA tenderness  Musculoskeletal:  He exhibits no edema.  Neurological: He is alert and oriented to person, place, and time.  Skin: Skin is warm and dry.  Back of right leg on lower 1/3 has 1 cm area of redness and induration.   Left leg around achilles tendon has 5 mm area of redness and induration.   Back of right leg has 2 cm area of redness and 1 cm area of induration without fluctuance.    Psychiatric: He has a normal mood and affect.    ED Course  Procedures (including critical care time)  DIAGNOSTIC STUDIES: Oxygen  Saturation is 100% on RA, normal by my interpretation.    COORDINATION OF CARE: 4:02 PM Discussed treatment plan with pt at bedside and pt agreed to plan.   Labs Review Labs Reviewed - No data to display  Imaging Review No results found.   EKG Interpretation None      MDM   Final diagnoses:  MRSA infection  Skin infection    Patient with skin infections small boils consistent with MRSA infection. Will treat with doxycycline. No evidence of any significant abscess at this point in time. Patient nontoxic no acute distress.  I personally performed the services described in this documentation, which was scribed in my presence. The recorded information has been reviewed and is accurate.    Vanetta MuldersScott Danine Hor, MD 02/08/14 [phone]

## 2014-02-08 NOTE — Discharge Instructions (Signed)
take take the antibiotic doxycycline as directed for the next 7 days. Would expect you to have improvement starting in 2 days. Return earlier for any new or worse symptoms. a

## 2014-02-08 NOTE — ED Notes (Signed)
PT noticed red/raised/painful area to the back of his right leg x2 days. PT also c/o two small red raised areas to left and right lower leg.

## 2014-02-09 MED FILL — Oxycodone w/ Acetaminophen Tab 5-325 MG: ORAL | Qty: 6 | Status: AC

## 2014-03-02 ENCOUNTER — Encounter (HOSPITAL_COMMUNITY): Payer: Self-pay | Admitting: Emergency Medicine

## 2014-03-02 ENCOUNTER — Emergency Department (HOSPITAL_COMMUNITY)
Admission: EM | Admit: 2014-03-02 | Discharge: 2014-03-02 | Disposition: A | Discharge status: Home / Self Care | Payer: Self-pay | Attending: Emergency Medicine | Admitting: Emergency Medicine

## 2014-03-02 DIAGNOSIS — K089 Disorder of teeth and supporting structures, unspecified: Secondary | ICD-10-CM | POA: Insufficient documentation

## 2014-03-02 DIAGNOSIS — Z91040 Latex allergy status: Secondary | ICD-10-CM | POA: Insufficient documentation

## 2014-03-02 DIAGNOSIS — F17200 Nicotine dependence, unspecified, uncomplicated: Secondary | ICD-10-CM | POA: Insufficient documentation

## 2014-03-02 DIAGNOSIS — Z792 Long term (current) use of antibiotics: Secondary | ICD-10-CM | POA: Insufficient documentation

## 2014-03-02 DIAGNOSIS — F319 Bipolar disorder, unspecified: Secondary | ICD-10-CM | POA: Insufficient documentation

## 2014-03-02 DIAGNOSIS — K029 Dental caries, unspecified: Secondary | ICD-10-CM | POA: Insufficient documentation

## 2014-03-02 DIAGNOSIS — Z87448 Personal history of other diseases of urinary system: Secondary | ICD-10-CM | POA: Insufficient documentation

## 2014-03-02 DIAGNOSIS — Z87442 Personal history of urinary calculi: Secondary | ICD-10-CM | POA: Insufficient documentation

## 2014-03-02 DIAGNOSIS — Z8659 Personal history of other mental and behavioral disorders: Secondary | ICD-10-CM | POA: Insufficient documentation

## 2014-03-02 DIAGNOSIS — Z8619 Personal history of other infectious and parasitic diseases: Secondary | ICD-10-CM | POA: Insufficient documentation

## 2014-03-02 MED ORDER — OXYCODONE-ACETAMINOPHEN 5-325 MG PO TABS
1.0000 | ORAL_TABLET | Freq: Once | ORAL | Status: AC
  Administered 2014-03-02: 1 via ORAL
  Filled 2014-03-02: qty 1

## 2014-03-02 MED ORDER — AMOXICILLIN 500 MG PO CAPS: 500.0000 mg | ORAL_CAPSULE | Freq: Three times a day (TID) | ORAL | Status: DC

## 2014-03-02 MED ORDER — AMOXICILLIN 250 MG PO CAPS
500.0000 mg | ORAL_CAPSULE | Freq: Once | ORAL | Status: AC
  Administered 2014-03-02: 500 mg via ORAL
  Filled 2014-03-02: qty 2

## 2014-03-02 MED ORDER — HYDROCODONE-ACETAMINOPHEN 5-325 MG PO TABS: 1.0000 | ORAL_TABLET | ORAL | Status: DC | PRN

## 2014-03-02 NOTE — ED Provider Notes (Signed)
Medical screening examination/treatment/procedure(s) were performed by non-physician practitioner and as supervising physician I was immediately available for consultation/collaboration.    Linwood Dibbles, MD 03/02/14 [phone]

## 2014-03-02 NOTE — ED Notes (Signed)
Pt reports dental pain to lower front teeth that started yesterday and has increased today. Pt reports taking  Motrin at 1700 before he went to work.

## 2014-03-02 NOTE — ED Provider Notes (Signed)
CSN: [id number]     Arrival date & time 03/02/14  2159 History   First MD Initiated Contact with Patient 03/02/14 2235     Chief Complaint  Patient presents with  . Dental Pain     (Consider location/radiation/quality/duration/timing/severity/associated sxs/prior Treatment) Patient is a 33 y.o. male presenting with tooth pain. The history is provided by the patient.  Dental Pain Location:  Lower Lower teeth location:  21/LL 1st bicuspid and 20/LL 2nd bicuspid Quality:  Throbbing Duration:  2 days Timing:  Constant Progression:  Worsening Chronicity:  New Context: poor dentition   Relieved by:  Nothing Worsened by:  Cold food/drink and hot food/drink Ineffective treatments:  Acetaminophen and NSAIDs Associated symptoms: facial pain and gum swelling   Risk factors: lack of dental care    Nicholas Nielsen is a 33 y.o. male who presents to the ED with dental pain that started 2 days ago. He has been taking OTC medications without relief. He has an appointment with Dr. Mylinda Latina but could not get an appointment until next week. The pain became severe tonight at work so he come to the ED.   Past Medical History  Diagnosis Date  . Bipolar 1 disorder   . Schizo affective schizophrenia   . Testicular cyst   . Back pain   . Hepatitis C   . Kidney stones    History reviewed. No pertinent past surgical history. Family History  Problem Relation Age of Onset  . Stroke Father   . Hypertension Father    History  Substance Use Topics  . Smoking status: Current Every Day Smoker -- 1.00 packs/day for 15 years    Types: Cigarettes  . Smokeless tobacco: Never Used  . Alcohol Use: No    Review of Systems  HENT: Positive for dental problem.    All other systems negative   Allergies  Latex  Home Medications   Prior to Admission medications   Medication Sig Start Date End Date Taking? Authorizing Provider  citalopram (CELEXA) 20 MG tablet Take 20 mg by mouth daily as needed (for  mood).    Historical Provider, MD  doxycycline (VIBRAMYCIN) 100 MG capsule Take 1 capsule (100 mg total) by mouth 2 (two) times daily. 02/08/14   Vanetta Mulders, MD  Multiple Vitamin (MULTIVITAMIN WITH MINERALS) TABS tablet Take 1 tablet by mouth daily.    Historical Provider, MD  oxyCODONE-acetaminophen (PERCOCET) 5-325 MG per tablet Take 1-2 tablets by mouth every 6 (six) hours as needed (for pain). 02/02/14   Carlisle Beers Molpus, MD  traZODone (DESYREL) 50 MG tablet Take 50-100 mg by mouth at bedtime as needed for sleep.    Historical Provider, MD   BP 152/92  Pulse 99  Temp(Src) 98.2 F (36.8 C) (Oral)  Resp 20  Ht  (1.854 m)  Wt 185 lb (83.915 kg)  BMI 24.41 kg/m2  SpO2 99% Physical Exam  Nursing note and vitals reviewed. Constitutional: He is oriented to person, place, and time. He appears well-developed and well-nourished. No distress.  HENT:  Head: Normocephalic.  Mouth/Throat: Uvula is midline, oropharynx is clear and moist and mucous membranes are normal. No trismus in the jaw. Dental caries present.    Eyes: EOM are normal.  Neck: Neck supple.  Cardiovascular: Normal rate and regular rhythm.   Pulmonary/Chest: Effort normal and breath sounds normal.  Musculoskeletal: Normal range of motion.  Lymphadenopathy:    He has no cervical adenopathy.  Neurological: He is alert and oriented to person,  place, and time. No cranial nerve deficit.  Skin: Skin is warm and dry.  Psychiatric: He has a normal mood and affect. His behavior is normal.    ED Course  Procedures (including critical care time) Labs Review  MDM  33 y.o. male with dental pain due to caries. Will treat for pain and infection. He will follow up with his dentist, Dr. Mylinda Latina, as scheduled next week. Stable for discharge without fever or other problems.  Discussed with the patient and all questioned fully answered.    Medication List    STOP taking these medications       doxycycline 100 MG capsule  Commonly  known as:  VIBRAMYCIN     oxyCODONE-acetaminophen 5-325 MG per tablet  Commonly known as:  PERCOCET      TAKE these medications       amoxicillin 500 MG capsule  Commonly known as:  AMOXIL  Take 1 capsule (500 mg total) by mouth 3 (three) times daily.     HYDROcodone-acetaminophen 5-325 MG per tablet  Commonly known as:  NORCO/VICODIN  Take 1 tablet by mouth every 4 (four) hours as needed.      ASK your doctor about these medications       citalopram 20 MG tablet  Commonly known as:  CELEXA  Take 20 mg by mouth daily as needed (for mood).     multivitamin with minerals Tabs tablet  Take 1 tablet by mouth daily.     traZODone 50 MG tablet  Commonly known as:  DESYREL  Take 50-100 mg by mouth at bedtime as needed for sleep.           Janne Napoleon, [place] 03/02/14 2252

## 2014-03-31 ENCOUNTER — Encounter (HOSPITAL_COMMUNITY): Payer: Self-pay | Admitting: Emergency Medicine

## 2014-03-31 ENCOUNTER — Emergency Department (HOSPITAL_COMMUNITY)
Admission: EM | Admit: 2014-03-31 | Discharge: 2014-03-31 | Disposition: A | Discharge status: Home / Self Care | Payer: Self-pay | Attending: Emergency Medicine | Admitting: Emergency Medicine

## 2014-03-31 DIAGNOSIS — F319 Bipolar disorder, unspecified: Secondary | ICD-10-CM | POA: Insufficient documentation

## 2014-03-31 DIAGNOSIS — F17200 Nicotine dependence, unspecified, uncomplicated: Secondary | ICD-10-CM | POA: Insufficient documentation

## 2014-03-31 DIAGNOSIS — Z8619 Personal history of other infectious and parasitic diseases: Secondary | ICD-10-CM | POA: Insufficient documentation

## 2014-03-31 DIAGNOSIS — Z91040 Latex allergy status: Secondary | ICD-10-CM | POA: Insufficient documentation

## 2014-03-31 DIAGNOSIS — L089 Local infection of the skin and subcutaneous tissue, unspecified: Secondary | ICD-10-CM | POA: Insufficient documentation

## 2014-03-31 DIAGNOSIS — Z87442 Personal history of urinary calculi: Secondary | ICD-10-CM | POA: Insufficient documentation

## 2014-03-31 DIAGNOSIS — R110 Nausea: Secondary | ICD-10-CM | POA: Insufficient documentation

## 2014-03-31 DIAGNOSIS — Z792 Long term (current) use of antibiotics: Secondary | ICD-10-CM | POA: Insufficient documentation

## 2014-03-31 DIAGNOSIS — L299 Pruritus, unspecified: Secondary | ICD-10-CM | POA: Insufficient documentation

## 2014-03-31 DIAGNOSIS — Z87448 Personal history of other diseases of urinary system: Secondary | ICD-10-CM | POA: Insufficient documentation

## 2014-03-31 MED ORDER — SULFAMETHOXAZOLE-TRIMETHOPRIM 800-160 MG PO TABS: 1.0000 | ORAL_TABLET | Freq: Two times a day (BID) | ORAL | Status: DC

## 2014-03-31 MED ORDER — SULFAMETHOXAZOLE-TMP DS 800-160 MG PO TABS
1.0000 | ORAL_TABLET | Freq: Once | ORAL | Status: AC
  Administered 2014-03-31: 1 via ORAL
  Filled 2014-03-31: qty 1

## 2014-03-31 NOTE — ED Notes (Signed)
Pt c/o tick to the right rid cage. States it may have been there for a few days.

## 2014-03-31 NOTE — ED Provider Notes (Signed)
CSN: [id number]     Arrival date & time 03/31/14  2225 History   First MD Initiated Contact with Patient 03/31/14 2304     Chief Complaint  Patient presents with  . Tick Removal     (Consider location/radiation/quality/duration/timing/severity/associated sxs/prior Treatment) HPI   Nicholas Nielsen is a 33 y.o. male who presents to the Emergency Department complaining of small area of redness, itching and pain to the right mid abdomen.  He states the area has been present for several days.  He states that someone looked at it and told him it was a tick and he needed to "have it checked."  He states that he has occassionally felt like he was "coming down with something" and reports intermittent body aches and nausea.  He has not tried any medications or therapies, he denies fever, chills, vomiting, rash or joint pains.  Nothing makes the symptoms worse.   Past Medical History  Diagnosis Date  . Bipolar 1 disorder   . Schizo affective schizophrenia   . Testicular cyst   . Back pain   . Hepatitis C   . Kidney stones    History reviewed. No pertinent past surgical history. Family History  Problem Relation Age of Onset  . Stroke Father   . Hypertension Father    History  Substance Use Topics  . Smoking status: Current Every Day Smoker -- 1.00 packs/day for 15 years    Types: Cigarettes  . Smokeless tobacco: Never Used  . Alcohol Use: No    Review of Systems  Constitutional: Negative for fever, chills and appetite change.  Gastrointestinal: Positive for nausea. Negative for vomiting and abdominal pain.  Musculoskeletal: Positive for myalgias. Negative for arthralgias and joint swelling.  Skin: Positive for color change. Negative for rash.       Red "knot" right abdomen  Hematological: Negative for adenopathy.  All other systems reviewed and are negative.     Allergies  Latex  Home Medications   Prior to Admission medications   Medication Sig Start Date End Date  Taking? Authorizing Provider  amoxicillin (AMOXIL) 500 MG capsule Take 1 capsule (500 mg total) by mouth 3 (three) times daily. 03/02/14   Hope Orlene OchM Neese, NP  citalopram (CELEXA) 20 MG tablet Take 20 mg by mouth daily as needed (for mood).    Historical Provider, MD  HYDROcodone-acetaminophen (NORCO/VICODIN) 5-325 MG per tablet Take 1 tablet by mouth every 4 (four) hours as needed. 03/02/14   Hope Orlene OchM Neese, NP  Multiple Vitamin (MULTIVITAMIN WITH MINERALS) TABS tablet Take 1 tablet by mouth daily.    Historical Provider, MD  traZODone (DESYREL) 50 MG tablet Take 50-100 mg by mouth at bedtime as needed for sleep.    Historical Provider, MD   BP 147/84  Pulse 84  Temp(Src) 98.6 F (37 C) (Oral)  Resp 20  Ht 6\' 1"  (1.854 m)  Wt 184 lb (83.462 kg)  BMI 24.28 kg/m2  SpO2 100% Physical Exam  Nursing note and vitals reviewed. Constitutional: He is oriented to person, place, and time. He appears well-developed and well-nourished. No distress.  HENT:  Head: Normocephalic and atraumatic.  Neck: Normal range of motion. Neck supple.  Cardiovascular: Normal rate, regular rhythm, normal heart sounds and intact distal pulses.   No murmur heard. Pulmonary/Chest: Effort normal and breath sounds normal. No respiratory distress.  Abdominal: Soft. He exhibits no distension. There is no tenderness. There is no rebound and no guarding.  Musculoskeletal: Normal range of motion.  Lymphadenopathy:    He has no cervical adenopathy.  Neurological: He is alert and oriented to person, place, and time. He exhibits normal muscle tone. Coordination normal.  Skin: Skin is warm and dry. No rash noted.  Localized 2 cm mildly erythematous nodule to the mid right abdomen with central scab present.  No evidence of a tick. No fluctuance or drainage    ED Course  Procedures (including critical care time) Labs Review Labs Reviewed - No data to display  Imaging Review No results found.   EKG Interpretation None       MDM   Final diagnoses:  Local skin infection    Patient is well appearing.  VSS.  Has a 2 cm nodule to the right mid abdomen.  No significant cellulitis.  No tick seen on exam.  Pt agrees to warm water soaks or compresses, rx for bactrim for likely early abscess vs possible spider bite.  Advised pt to return here if any worsening sx's.  He appears stable for d/c    Chadd Tollison L. Trisha Mangle, PA-C 04/01/14 [phone]

## 2014-04-01 NOTE — ED Provider Notes (Signed)
Medical screening examination/treatment/procedure(s) were performed by non-physician practitioner and as supervising physician I was immediately available for consultation/collaboration.   EKG Interpretation None        Burnham Trost, MD 04/01/14 0207 

## 2014-04-09 ENCOUNTER — Encounter (HOSPITAL_COMMUNITY): Payer: Self-pay | Admitting: Emergency Medicine

## 2014-04-09 ENCOUNTER — Emergency Department (HOSPITAL_COMMUNITY)
Admission: EM | Admit: 2014-04-09 | Discharge: 2014-04-10 | Disposition: A | Discharge status: Home / Self Care | Payer: Self-pay | Attending: Emergency Medicine | Admitting: Emergency Medicine

## 2014-04-09 DIAGNOSIS — Z792 Long term (current) use of antibiotics: Secondary | ICD-10-CM | POA: Insufficient documentation

## 2014-04-09 DIAGNOSIS — Z87448 Personal history of other diseases of urinary system: Secondary | ICD-10-CM | POA: Insufficient documentation

## 2014-04-09 DIAGNOSIS — Z8619 Personal history of other infectious and parasitic diseases: Secondary | ICD-10-CM | POA: Insufficient documentation

## 2014-04-09 DIAGNOSIS — F319 Bipolar disorder, unspecified: Secondary | ICD-10-CM | POA: Insufficient documentation

## 2014-04-09 DIAGNOSIS — Z720 Tobacco use: Secondary | ICD-10-CM | POA: Insufficient documentation

## 2014-04-09 DIAGNOSIS — R109 Unspecified abdominal pain: Secondary | ICD-10-CM | POA: Insufficient documentation

## 2014-04-09 DIAGNOSIS — Z87442 Personal history of urinary calculi: Secondary | ICD-10-CM | POA: Insufficient documentation

## 2014-04-09 DIAGNOSIS — Z91040 Latex allergy status: Secondary | ICD-10-CM | POA: Insufficient documentation

## 2014-04-09 LAB — URINALYSIS, ROUTINE W REFLEX MICROSCOPIC
Bilirubin Urine: NEGATIVE
GLUCOSE, UA: NEGATIVE mg/dL
KETONES UR: NEGATIVE mg/dL
Leukocytes, UA: NEGATIVE
NITRITE: NEGATIVE
Protein, ur: NEGATIVE mg/dL
Specific Gravity, Urine: 1.015 (ref 1.005–1.030)
Urobilinogen, UA: 0.2 mg/dL (ref 0.0–1.0)
pH: 6 (ref 5.0–8.0)

## 2014-04-09 LAB — URINE MICROSCOPIC-ADD ON

## 2014-04-09 NOTE — ED Notes (Signed)
Pt c/o rt abd pain x one week that radiates to back and left side as well per pt.

## 2014-04-10 MED ORDER — HYDROCODONE-ACETAMINOPHEN 5-325 MG PO TABS
1.0000 | ORAL_TABLET | Freq: Once | ORAL | Status: AC
  Administered 2014-04-10: 1 via ORAL

## 2014-04-10 MED ORDER — HYDROCODONE-ACETAMINOPHEN 5-325 MG PO TABS
ORAL_TABLET | ORAL | Status: AC
  Filled 2014-04-10: qty 1

## 2014-04-10 MED ORDER — HYDROCODONE-ACETAMINOPHEN 5-325 MG PO TABS: 1.0000 | ORAL_TABLET | Freq: Four times a day (QID) | ORAL | Status: DC | PRN

## 2014-04-10 NOTE — Discharge Instructions (Signed)
Amylase Amylase is an enzyme. It is measured in the blood. This test can provide information on the function of your pancreas. This test is used to diagnose swelling of the pancreas (pancreatitis) and other pancreatic diseases. The rise of amylase at the beginning of a pancreatitis attack, and its fall after about 2 days, helps to make a diagnosis. This test may be done if you have problems that suggest a disorder of the pancreas. Such problems may include severe abdominal pain, fever, loss of appetite, or nausea. Long-standing pancreatitis may be associated with alcoholism. It can also be caused by injury or a blocked pancreatic duct. It may also be associated with genetic abnormalities such as cystic fibrosis. Amylase levels may be only moderately increased with chronic pancreatitis. Amylase levels may be only moderately decreased when the cells that produce amylase in the pancreas are injured or destroyed. Other causes of an increase in this enzyme include problems with the bowel, ovaries, skeletal muscle, or mumps. Amylase is also used in the diagnosis and follow-up of cancer of the pancreas, ovaries, or lungs, gallbladder attack, and mumps.  PREPARATION FOR TEST No preparation or fasting is necessary. A blood sample will be drawn from a vein in the arm.  NORMAL FINDINGS   Newborn: 6 to 65 units/L  Adults: 60 to 180 units/L (0.8 to 3.2 microkatal/L) Amylase levels remain low for the first 2 months of life. They increase to adult values by 35 year of age. Values may be slightly increased during normal pregnancy and in older adults. Ranges for normal findings may vary among different laboratories and hospitals. You should always check with your caregiver after having lab work or other tests done to discuss the meaning of your test results and whether your values are considered within normal limits. MEANING OF TEST  Your caregiver will go over the test results with you. Your caregiver will discuss the  importance and meaning of your results. He or she will also discuss treatment options and additional tests, if needed. OBTAINING THE TEST RESULTS  It is your responsibility to obtain your test results. Ask the lab or department performing the test when and how you will get your results. Document Released: 07/11/2004 Document Revised: 09/11/2011 Document Reviewed: 05/25/2008 [facility] Patient Information 2015 [employer], [place]. This information is not intended to replace advice given to you by your health care provider. Make sure you discuss any questions you have with your health care provider.  Flank Pain Flank pain refers to pain that is located on the side of the body between the upper abdomen and the back. The pain may occur over a short period of time (acute) or may be long-term or reoccurring (chronic). It may be mild or severe. Flank pain can be caused by many things. CAUSES  Some of the more common causes of flank pain include:  Muscle strains.   Muscle spasms.   A disease of your spine (vertebral disk disease).   A lung infection (pneumonia).   Fluid around your lungs (pulmonary edema).   A kidney infection.   Kidney stones.   A very painful skin rash caused by the chickenpox virus (shingles).   Gallbladder disease.  HOME CARE INSTRUCTIONS  Home care will depend on the cause of your pain. In general,  Rest as directed by your caregiver.  Drink enough fluids to keep your urine clear or pale yellow.  Only take over-the-counter or prescription medicines as directed by your caregiver. Some medicines may help relieve the pain.  Tell your caregiver about any changes in your pain.  Follow up with your caregiver as directed. SEEK IMMEDIATE MEDICAL CARE IF:   Your pain is not controlled with medicine.   You have new or worsening symptoms.  Your pain increases.   You have abdominal pain.   You have shortness of breath.   You have persistent nausea or  vomiting.   You have swelling in your abdomen.   You feel faint or pass out.   You have blood in your urine.  You have a fever or persistent symptoms for more than 2-3 days.  You have a fever and your symptoms suddenly get worse. MAKE SURE YOU:   Understand these instructions.  Will watch your condition.  Will get help right away if you are not doing well or get worse. Document Released: 08/10/2005 Document Revised: 03/13/2012 Document Reviewed: 02/01/2012 [facility] Patient Information 2015 [place], [place]. This information is not intended to replace advice given to you by your health care provider. Make sure you discuss any questions you have with your health care provider.

## 2014-04-10 NOTE — ED Provider Notes (Signed)
CSN: [id number]     Arrival date & time 04/09/14  2306 History   First MD Initiated Contact with Patient 04/10/14 0129     Chief Complaint  Patient presents with  . Abdominal Pain     (Consider location/radiation/quality/duration/timing/severity/associated sxs/prior Treatment) HPI This is a 33 year old male with a history of kidney stones. He is here with a history of moderate to severe right flank pain radiating to his right testicle. This pain has subsequently resolved. He has also had pain in the left flank that does not radiate to the left testicle and has not been as severe. He has had intermittent nausea but no vomiting. He has not noticed any hematuria. Pain is similar to previous kidney stones. Pain is somewhat improved with ibuprofen. Pain is not worse with movement or palpation the  Past Medical History  Diagnosis Date  . Bipolar 1 disorder   . Schizo affective schizophrenia   . Testicular cyst   . Back pain   . Hepatitis C   . Kidney stones    History reviewed. No pertinent past surgical history. Family History  Problem Relation Age of Onset  . Stroke Father   . Hypertension Father    History  Substance Use Topics  . Smoking status: Current Every Day Smoker -- 1.00 packs/day for 15 years    Types: Cigarettes  . Smokeless tobacco: Never Used  . Alcohol Use: No    Review of Systems  All other systems reviewed and are negative.   Allergies  Latex  Home Medications   Prior to Admission medications   Medication Sig Start Date End Date Taking? Authorizing Provider  amoxicillin (AMOXIL) 500 MG capsule Take 1 capsule (500 mg total) by mouth 3 (three) times daily. 03/02/14   Hope Orlene OchM Neese, NP  citalopram (CELEXA) 20 MG tablet Take 20 mg by mouth daily as needed (for mood).    Historical Provider, MD  HYDROcodone-acetaminophen (NORCO/VICODIN) 5-325 MG per tablet Take 1 tablet by mouth every 4 (four) hours as needed. 03/02/14   Hope Orlene OchM Neese, NP  Multiple Vitamin  (MULTIVITAMIN WITH MINERALS) TABS tablet Take 1 tablet by mouth daily.    Historical Provider, MD  sulfamethoxazole-trimethoprim (SEPTRA DS) 800-160 MG per tablet Take 1 tablet by mouth 2 (two) times daily. For 10 days 03/31/14   Tammy L. Triplett, PA-C  traZODone (DESYREL) 50 MG tablet Take 50-100 mg by mouth at bedtime as needed for sleep.    Historical Provider, MD   BP 138/76  Pulse 88  Temp(Src) 98 F (36.7 C) (Oral)  Resp 19  Ht 6\' 1"  (1.854 m)  Wt 184 lb 5 oz (83.604 kg)  BMI 24.32 kg/m2  SpO2 100%  Physical Exam General: Well-developed, well-nourished male in no acute distress; appearance consistent with age of record HENT: normocephalic; atraumatic Eyes: pupils equal, round and reactive to light; extraocular muscles intact Neck: supple Heart: regular rate and rhythm Lungs: clear to auscultation bilaterally Abdomen: soft; nondistended; nontender; no masses or hepatosplenomegaly; bowel sounds present GU: No CVA tenderness Extremities: No deformity; full range of motion; pulses normal Neurologic: Awake, alert and oriented; motor function intact in all extremities and symmetric; no facial droop Skin: Warm and dry Psychiatric: Normal mood and affect    ED Course  Procedures (including critical care time)   MDM   Nursing notes and vitals signs, including pulse oximetry, reviewed.  Summary of this visit's results, reviewed by myself:  Labs:  Results for orders placed during the hospital encounter  of 04/09/14 (from the past 24 hour(s))  URINALYSIS, ROUTINE W REFLEX MICROSCOPIC     Status: Abnormal   Collection Time    04/09/14 11:34 PM      Result Value Ref Range   Color, Urine YELLOW  YELLOW   APPearance CLEAR  CLEAR   Specific Gravity, Urine 1.015  1.005 - 1.030   pH 6.0  5.0 - 8.0   Glucose, UA NEGATIVE  NEGATIVE mg/dL   Hgb urine dipstick TRACE (*) NEGATIVE   Bilirubin Urine NEGATIVE  NEGATIVE   Ketones, ur NEGATIVE  NEGATIVE mg/dL   Protein, ur NEGATIVE   NEGATIVE mg/dL   Urobilinogen, UA 0.2  0.0 - 1.0 mg/dL   Nitrite NEGATIVE  NEGATIVE   Leukocytes, UA NEGATIVE  NEGATIVE  URINE MICROSCOPIC-ADD ON     Status: Abnormal   Collection Time    04/09/14 11:34 PM      Result Value Ref Range   Squamous Epithelial / LPF FEW (*) RARE   RBC / HPF 3-6  <3 RBC/hpf   Bacteria, UA FEW (*) RARE   Urine-Other MUCOUS PRESENT     Previous CT scans show multiple bilateral nonobstructive stones. Patient may be passing stones. There is slight microscopic hematuria which would be consistent with this.  Hanley SeamenJohn L Jadasia Haws, MD 04/10/14 [phone]

## 2014-06-14 ENCOUNTER — Emergency Department (HOSPITAL_COMMUNITY)
Admission: EM | Admit: 2014-06-14 | Discharge: 2014-06-14 | Disposition: A | Discharge status: Home / Self Care | Payer: Self-pay | Attending: Emergency Medicine | Admitting: Emergency Medicine

## 2014-06-14 ENCOUNTER — Encounter (HOSPITAL_COMMUNITY): Payer: Self-pay | Admitting: *Deleted

## 2014-06-14 DIAGNOSIS — Y9389 Activity, other specified: Secondary | ICD-10-CM | POA: Insufficient documentation

## 2014-06-14 DIAGNOSIS — Z91040 Latex allergy status: Secondary | ICD-10-CM | POA: Insufficient documentation

## 2014-06-14 DIAGNOSIS — Z87442 Personal history of urinary calculi: Secondary | ICD-10-CM | POA: Insufficient documentation

## 2014-06-14 DIAGNOSIS — S39012A Strain of muscle, fascia and tendon of lower back, initial encounter: Secondary | ICD-10-CM | POA: Insufficient documentation

## 2014-06-14 DIAGNOSIS — Y998 Other external cause status: Secondary | ICD-10-CM | POA: Insufficient documentation

## 2014-06-14 DIAGNOSIS — Z8659 Personal history of other mental and behavioral disorders: Secondary | ICD-10-CM | POA: Insufficient documentation

## 2014-06-14 DIAGNOSIS — X58XXXA Exposure to other specified factors, initial encounter: Secondary | ICD-10-CM | POA: Insufficient documentation

## 2014-06-14 DIAGNOSIS — Z720 Tobacco use: Secondary | ICD-10-CM | POA: Insufficient documentation

## 2014-06-14 DIAGNOSIS — Y9289 Other specified places as the place of occurrence of the external cause: Secondary | ICD-10-CM | POA: Insufficient documentation

## 2014-06-14 DIAGNOSIS — Z8619 Personal history of other infectious and parasitic diseases: Secondary | ICD-10-CM | POA: Insufficient documentation

## 2014-06-14 LAB — URINALYSIS, ROUTINE W REFLEX MICROSCOPIC
Bilirubin Urine: NEGATIVE
Glucose, UA: NEGATIVE mg/dL
Ketones, ur: NEGATIVE mg/dL
Leukocytes, UA: NEGATIVE
NITRITE: NEGATIVE
PH: 6 (ref 5.0–8.0)
Protein, ur: NEGATIVE mg/dL
Specific Gravity, Urine: >1.03 — ABNORMAL HIGH (ref 1.005–1.030)
UROBILINOGEN UA: 0.2 mg/dL (ref 0.0–1.0)

## 2014-06-14 LAB — URINE MICROSCOPIC-ADD ON

## 2014-06-14 MED ORDER — DICLOFENAC SODIUM 75 MG PO TBEC: 75.0000 mg | DELAYED_RELEASE_TABLET | Freq: Two times a day (BID) | ORAL | Status: DC

## 2014-06-14 MED ORDER — ACETAMINOPHEN-CODEINE #3 300-30 MG PO TABS: 1.0000 | ORAL_TABLET | Freq: Four times a day (QID) | ORAL | Status: DC | PRN

## 2014-06-14 MED ORDER — KETOROLAC TROMETHAMINE 10 MG PO TABS
10.0000 mg | ORAL_TABLET | Freq: Once | ORAL | Status: AC
  Administered 2014-06-14: 10 mg via ORAL
  Filled 2014-06-14: qty 1

## 2014-06-14 MED ORDER — BACLOFEN 10 MG PO TABS: 10.0000 mg | ORAL_TABLET | Freq: Three times a day (TID) | ORAL | Status: AC

## 2014-06-14 NOTE — Discharge Instructions (Signed)
Heating pad to the lower back may be helpful. Your urine test reveal decrease in fluid intake. Please increase water, juices, gatorade, etc..Use tylenol codeine, baclofen and diclofenac as suggested. Baclofen and tylenol codeine my cause drowsiness, please do not drink, drive or participate in activity that requires concentration when taking this medication. Muscle Strain A muscle strain (pulled muscle) happens when a muscle is stretched beyond normal length. It happens when a sudden, violent force stretches your muscle too far. Usually, a few of the fibers in your muscle are torn. Muscle strain is common in athletes. Recovery usually takes 1-2 weeks. Complete healing takes 5-6 weeks.  HOME CARE   Follow the PRICE method of treatment to help your injury get better. Do this the first 2-3 days after the injury:  Protect. Protect the muscle to keep it from getting injured again.  Rest. Limit your activity and rest the injured body part.  Ice. Put ice in a plastic bag. Place a towel between your skin and the bag. Then, apply the ice and leave it on from 15-20 minutes each hour. After the third day, switch to moist heat packs.  Compression. Use a splint or elastic bandage on the injured area for comfort. Do not put it on too tightly.  Elevate. Keep the injured body part above the level of your heart.  Only take medicine as told by your doctor.  Warm up before doing exercise to prevent future muscle strains. GET HELP IF:   You have more pain or puffiness (swelling) in the injured area.  You feel numbness, tingling, or notice a loss of strength in the injured area. MAKE SURE YOU:   Understand these instructions.  Will watch your condition.  Will get help right away if you are not doing well or get worse. Document Released: 03/28/2008 Document Revised: 04/09/2013 Document Reviewed: 01/16/2013 [facility] Patient Information 2015 [place], [place]. This information is not intended to replace  advice given to you by your health care provider. Make sure you discuss any questions you have with your health care provider.  Back Pain, Adult Low back pain is very common. About 1 in 5 people have back pain.The cause of low back pain is rarely dangerous. The pain often gets better over time.About half of people with a sudden onset of back pain feel better in just 2 weeks. About 8 in 10 people feel better by 6 weeks.  CAUSES Some common causes of back pain include:  Strain of the muscles or ligaments supporting the spine.  Wear and tear (degeneration) of the spinal discs.  Arthritis.  Direct injury to the back. DIAGNOSIS Most of the time, the direct cause of low back pain is not known.However, back pain can be treated effectively even when the exact cause of the pain is unknown.Answering your caregiver's questions about your overall health and symptoms is one of the most accurate ways to make sure the cause of your pain is not dangerous. If your caregiver needs more information, he or she may order lab work or imaging tests (X-rays or MRIs).However, even if imaging tests show changes in your back, this usually does not require surgery. HOME CARE INSTRUCTIONS For many people, back pain returns.Since low back pain is rarely dangerous, it is often a condition that people can learn to Memorial Hospital Incmanageon their own.   Remain active. It is stressful on the back to sit or stand in one place. Do not sit, drive, or stand in one place for more than 30 minutes  at a time. Take short walks on level surfaces as soon as pain allows.Try to increase the length of time you walk each day.  Do not stay in bed.Resting more than 1 or 2 days can delay your recovery.  Do not avoid exercise or work.Your body is made to move.It is not dangerous to be active, even though your back may hurt.Your back will likely heal faster if you return to being active before your pain is gone.  Pay attention to your body when you  bend and lift. Many people have less discomfortwhen lifting if they bend their knees, keep the load close to their bodies,and avoid twisting. Often, the most comfortable positions are those that put less stress on your recovering back.  Find a comfortable position to sleep. Use a firm mattress and lie on your side with your knees slightly bent. If you lie on your back, put a pillow under your knees.  Only take over-the-counter or prescription medicines as directed by your caregiver. Over-the-counter medicines to reduce pain and inflammation are often the most helpful.Your caregiver may prescribe muscle relaxant drugs.These medicines help dull your pain so you can more quickly return to your normal activities and healthy exercise.  Put ice on the injured area.  Put ice in a plastic bag.  Place a towel between your skin and the bag.  Leave the ice on for 15-20 minutes, 03-04 times a day for the first 2 to 3 days. After that, ice and heat may be alternated to reduce pain and spasms.  Ask your caregiver about trying back exercises and gentle massage. This may be of some benefit.  Avoid feeling anxious or stressed.Stress increases muscle tension and can worsen back pain.It is important to recognize when you are anxious or stressed and learn ways to manage it.Exercise is a great option. SEEK MEDICAL CARE IF:  You have pain that is not relieved with rest or medicine.  You have pain that does not improve in 1 week.  You have new symptoms.  You are generally not feeling well. SEEK IMMEDIATE MEDICAL CARE IF:   You have pain that radiates from your back into your legs.  You develop new bowel or bladder control problems.  You have unusual weakness or numbness in your arms or legs.  You develop nausea or vomiting.  You develop abdominal pain.  You feel faint. Document Released: 06/19/2005 Document Revised: 12/19/2011 Document Reviewed: 10/21/2013 [facility]ExitCare Patient Information 2015  [place]ExitCare, [place]LL[place]. This information is not intended to replace advice given to you by your health care provider. Make sure you discuss any questions you have with your health care provider.

## 2014-06-14 NOTE — ED Provider Notes (Signed)
CSN: [id number]     Arrival date & time 06/14/14  1847 History  This chart was scribed for non-physician practitioner, Ivery QualeHobson Megean Fabio, PA-C,working with Toy BakerAnthony T Allen, MD, by Karle PlumberJennifer Tensley, ED Scribe. This patient was seen in room APFT22/APFT22 and the patient's care was started at 7:23 PM.  Chief Complaint  Patient presents with  . Back Pain   Patient is a 33 y.o. male presenting with back pain. The history is provided by the patient. No language interpreter was used.  Back Pain Associated symptoms: no fever, no numbness and no weakness     HPI Comments:  Nicholas Nielsen is a 33 y.o. male with PMH of chronic back pain and kidney stones who presents to the Emergency Department complaining of moderate lower back pain that began about 1-1.5 weeks ago upon waking one morning. He also reports intermittent right sided pain that radiates from the back. 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 Squatting makes the pain worse. Denies alleviating factors. Denies trauma, injury or fall. Denies bowel or bladder incontinence, numbness, tingling or weakness of the lower extremities, nausea, vomiting, fever or chills. PMH of bipolar disorder, schizoaffective disorder, hepatitis C and testicular cyst.  Pt does not have a PCP.  Past Medical History  Diagnosis Date  . Bipolar 1 disorder   . Schizo affective schizophrenia   . Testicular cyst   . Back pain   . Hepatitis C   . Kidney stones    History reviewed. No pertinent past surgical history. Family History  Problem Relation Age of Onset  . Stroke Father   . Hypertension Father    History  Substance Use Topics  . Smoking status: Current Every Day Smoker -- 1.00 packs/day for 15 years    Types: Cigarettes  . Smokeless tobacco: Never Used  . Alcohol Use: No    Review of Systems  Constitutional: Negative for fever and chills.  Gastrointestinal: Negative for nausea and vomiting.  Genitourinary:        No bladder or bowel incontinence.  Musculoskeletal: Positive for back pain.  Skin: Negative for wound.  Neurological: Negative for weakness and numbness.  All other systems reviewed and are negative.   Allergies  Latex  Home Medications   Prior to Admission medications   Medication Sig Start Date End Date Taking? Authorizing Provider  ibuprofen (ADVIL,MOTRIN) 200 MG tablet Take 800 mg by mouth every 6 (six) hours as needed for mild pain or moderate pain.   Yes Historical Provider, MD  Multiple Vitamin (MULTIVITAMIN WITH MINERALS) TABS tablet Take 1 tablet by mouth daily.   Yes Historical Provider, MD  amoxicillin (AMOXIL) 500 MG capsule Take 1 capsule (500 mg total) by mouth 3 (three) times daily. Patient not taking: Reported on 06/14/2014 03/02/14   Janne NapoleonHope M Neese, NP  HYDROcodone-acetaminophen (NORCO) 5-325 MG per tablet Take 1-2 tablets by mouth every 6 (six) hours as needed (for pain). Patient not taking: Reported on 06/14/2014 04/10/14   Carlisle BeersJohn L Molpus, MD  HYDROcodone-acetaminophen (NORCO/VICODIN) 5-325 MG per tablet Take 1 tablet by mouth every 4 (four) hours as needed. Patient not taking: Reported on 06/14/2014 03/02/14   Janne NapoleonHope M Neese, NP  sulfamethoxazole-trimethoprim (SEPTRA DS) 800-160 MG per tablet Take 1 tablet by mouth 2 (two) times daily. For 10 days Patient not taking: Reported on 06/14/2014 03/31/14   Tammy L. Triplett, PA-C   Triage Vitals: BP 138/90 mmHg  Pulse 95  Temp(Src) 98.6 F (  37 C) (Oral)  Resp 16  Ht 6\' 1"  (1.854 m)  Wt 185 lb (83.915 kg)  BMI 24.41 kg/m2  SpO2 100% Physical Exam  Constitutional: He is oriented to person, place, and time. He appears well-developed and well-nourished.  HENT:  Head: Normocephalic and atraumatic.  Eyes: EOM are normal.  Neck: Normal range of motion.  Cardiovascular: Normal rate, regular rhythm and normal heart sounds.  Exam reveals no gallop and no friction rub.   No murmur heard. Pulmonary/Chest: Effort normal and breath  sounds normal. No respiratory distress. He has no wheezes. He has no rales.  Musculoskeletal: Normal range of motion.  No palpable step off of the lumbar spine. No bony tenderness. Pain with flexion and extension of the lumbar region in paraspinal area. No warmth of the skin.  Neurological: He is alert and oriented to person, place, and time.  Distal sensations intact. No gross motor or sensory deficits. Steady gait with no foot drop.  Skin: Skin is warm and dry.  Psychiatric: He has a normal mood and affect. His behavior is normal.  Nursing note and vitals reviewed.   ED Course  Procedures (including critical care time) DIAGNOSTIC STUDIES: Oxygen Saturation is 100% on RA, normal by my interpretation.   COORDINATION OF CARE: 7:31 PM- Will prescribe Baclofen, Diclofenac and Tylenol with codeine. Advised pt to follow up with PCP for chronic back pain complaints. Pt verbalizes understanding and agrees to plan.  Medications - No data to display  Labs Review Labs Reviewed  URINALYSIS, ROUTINE W REFLEX MICROSCOPIC    Imaging Review No results found.   EKG Interpretation None      MDM  Vital signs stable.  UA reveals SpGrav elevated greater than 1.030. Urine otherwise wnl. No evidence for UTI or kidney stone. Exam favors lumbar strain. Rx for diclofenac, tylenol codeine, and baclofen given to the patient. Pt to follow up with PCP for continued work up of this issue.  Pt ambulatory at d/c.   Final diagnoses:  Lumbar strain, initial encounter    *I have reviewed nursing notes, vital signs, and all appropriate lab and imaging results for this patient.**  I personally performed the services described in this documentation, which was scribed in my presence. The recorded information has been reviewed and is accurate.    Kathie DikeHobson M Taesha Goodell, PA-C 06/16/14 1526  Toy BakerAnthony T Allen, MD 06/18/14 [phone]

## 2014-06-14 NOTE — ED Notes (Signed)
Pt c/o lower back pain that radiates around to his rlq. Pt denies any abdominal pain, n/v/d. No known injury. Pt states he is on his feet a lot.

## 2014-12-02 ENCOUNTER — Encounter (HOSPITAL_COMMUNITY): Payer: Self-pay | Admitting: Emergency Medicine

## 2014-12-02 ENCOUNTER — Emergency Department (HOSPITAL_COMMUNITY)
Admission: EM | Admit: 2014-12-02 | Discharge: 2014-12-03 | Disposition: A | Discharge status: Home / Self Care | Payer: Self-pay | Attending: Emergency Medicine | Admitting: Emergency Medicine

## 2014-12-02 DIAGNOSIS — M549 Dorsalgia, unspecified: Secondary | ICD-10-CM | POA: Insufficient documentation

## 2014-12-02 DIAGNOSIS — Z87448 Personal history of other diseases of urinary system: Secondary | ICD-10-CM | POA: Insufficient documentation

## 2014-12-02 DIAGNOSIS — K029 Dental caries, unspecified: Secondary | ICD-10-CM

## 2014-12-02 DIAGNOSIS — K047 Periapical abscess without sinus: Secondary | ICD-10-CM

## 2014-12-02 DIAGNOSIS — F319 Bipolar disorder, unspecified: Secondary | ICD-10-CM | POA: Insufficient documentation

## 2014-12-02 DIAGNOSIS — Z791 Long term (current) use of non-steroidal anti-inflammatories (NSAID): Secondary | ICD-10-CM | POA: Insufficient documentation

## 2014-12-02 DIAGNOSIS — Z8619 Personal history of other infectious and parasitic diseases: Secondary | ICD-10-CM | POA: Insufficient documentation

## 2014-12-02 DIAGNOSIS — Z79899 Other long term (current) drug therapy: Secondary | ICD-10-CM | POA: Insufficient documentation

## 2014-12-02 DIAGNOSIS — Z720 Tobacco use: Secondary | ICD-10-CM | POA: Insufficient documentation

## 2014-12-02 DIAGNOSIS — Z91040 Latex allergy status: Secondary | ICD-10-CM | POA: Insufficient documentation

## 2014-12-02 DIAGNOSIS — Z87442 Personal history of urinary calculi: Secondary | ICD-10-CM | POA: Insufficient documentation

## 2014-12-02 NOTE — ED Notes (Signed)
Pt c/o dental pain for a couple of days.  

## 2014-12-03 MED ORDER — ONDANSETRON HCL 4 MG PO TABS
4.0000 mg | ORAL_TABLET | Freq: Once | ORAL | Status: AC
  Administered 2014-12-03: 4 mg via ORAL
  Filled 2014-12-03: qty 1

## 2014-12-03 MED ORDER — AMOXICILLIN 250 MG PO CAPS
500.0000 mg | ORAL_CAPSULE | Freq: Once | ORAL | Status: AC
  Administered 2014-12-03: 500 mg via ORAL
  Filled 2014-12-03: qty 2

## 2014-12-03 MED ORDER — IBUPROFEN 800 MG PO TABS
800.0000 mg | ORAL_TABLET | Freq: Once | ORAL | Status: AC
  Administered 2014-12-03: 800 mg via ORAL
  Filled 2014-12-03: qty 1

## 2014-12-03 MED ORDER — IBUPROFEN 800 MG PO TABS: 800.0000 mg | ORAL_TABLET | Freq: Three times a day (TID) | ORAL | Status: DC

## 2014-12-03 MED ORDER — ACETAMINOPHEN-CODEINE #3 300-30 MG PO TABS
2.0000 | ORAL_TABLET | Freq: Once | ORAL | Status: AC
  Administered 2014-12-03: 2 via ORAL
  Filled 2014-12-03: qty 2

## 2014-12-03 MED ORDER — AMOXICILLIN 500 MG PO CAPS: 500.0000 mg | ORAL_CAPSULE | Freq: Three times a day (TID) | ORAL | Status: DC

## 2014-12-03 NOTE — Discharge Instructions (Signed)
Dental Caries °Dental caries is tooth decay. This decay can cause a hole in teeth (cavity) that can get bigger and deeper over time. °HOME CARE °· Brush and floss your teeth. Do this at least two times a day. °· Use a fluoride toothpaste. °· Use a mouth rinse if told by your dentist or doctor. °· Eat less sugary and starchy foods. Drink less sugary drinks. °· Avoid snacking often on sugary and starchy foods. Avoid sipping often on sugary drinks. °· Keep regular checkups and cleanings with your dentist. °· Use fluoride supplements if told by your dentist or doctor. °· Allow fluoride to be applied to teeth if told by your dentist or doctor. °Document Released: 03/28/2008 Document Revised: 11/03/2013 Document Reviewed: 06/21/2012 °ExitCare® Patient Information ©2015 [employer], LLC. This information is not intended to replace advice given to you by your health care provider. Make sure you discuss any questions you have with your health care provider. ° °

## 2014-12-03 NOTE — ED Provider Notes (Signed)
CSN: [id number]     Arrival date & time 12/02/14  2330 History   First MD Initiated Contact with Patient 12/02/14 2355     Chief Complaint  Patient presents with  . Dental Pain     (Consider location/radiation/quality/duration/timing/severity/associated sxs/prior Treatment) Patient is a 34 y.o. male presenting with tooth pain. The history is provided by the patient.  Dental Pain Location:  Lower Quality:  Sharp and throbbing Severity:  Moderate Onset quality:  Gradual Duration:  3 days Timing:  Intermittent Progression:  Worsening Chronicity:  Chronic Context: dental caries   Relieved by:  Nothing Worsened by:  Cold food/drink Associated symptoms: facial pain   Associated symptoms: no fever and no trismus   Risk factors: lack of dental care and smoking   Risk factors: no diabetes     Past Medical History  Diagnosis Date  . Bipolar 1 disorder   . Schizo affective schizophrenia   . Testicular cyst   . Back pain   . Hepatitis C   . Kidney stones    History reviewed. No pertinent past surgical history. Family History  Problem Relation Age of Onset  . Stroke Father   . Hypertension Father    History  Substance Use Topics  . Smoking status: Current Every Day Smoker -- 1.00 packs/day for 15 years    Types: Cigarettes  . Smokeless tobacco: Never Used  . Alcohol Use: Yes    Review of Systems  Constitutional: Negative for fever.  HENT: Positive for dental problem.   Musculoskeletal: Positive for back pain.  Psychiatric/Behavioral:       Bipolar disorder      Allergies  Latex  Home Medications   Prior to Admission medications   Medication Sig Start Date End Date Taking? Authorizing Provider  acetaminophen-codeine (TYLENOL #3) 300-30 MG per tablet Take 1-2 tablets by mouth every 6 (six) hours as needed. 06/14/14   Ivery QualeHobson Lavern Crimi, PA-C  amoxicillin (AMOXIL) 500 MG capsule Take 1 capsule (500 mg total) by mouth 3 (three) times daily. Patient not taking: Reported  on 06/14/2014 03/02/14   Janne NapoleonHope M Neese, NP  diclofenac (VOLTAREN) 75 MG EC tablet Take 1 tablet (75 mg total) by mouth 2 (two) times daily. 06/14/14   Ivery QualeHobson Darrio Bade, PA-C  HYDROcodone-acetaminophen (NORCO) 5-325 MG per tablet Take 1-2 tablets by mouth every 6 (six) hours as needed (for pain). Patient not taking: Reported on 06/14/2014 04/10/14   Paula LibraJohn Molpus, MD  HYDROcodone-acetaminophen (NORCO/VICODIN) 5-325 MG per tablet Take 1 tablet by mouth every 4 (four) hours as needed. Patient not taking: Reported on 06/14/2014 03/02/14   Janne NapoleonHope M Neese, NP  ibuprofen (ADVIL,MOTRIN) 200 MG tablet Take 800 mg by mouth every 6 (six) hours as needed for mild pain or moderate pain.    Historical Provider, MD  Multiple Vitamin (MULTIVITAMIN WITH MINERALS) TABS tablet Take 1 tablet by mouth daily.    Historical Provider, MD  sulfamethoxazole-trimethoprim (SEPTRA DS) 800-160 MG per tablet Take 1 tablet by mouth 2 (two) times daily. For 10 days Patient not taking: Reported on 06/14/2014 03/31/14   Tammy Triplett, PA-C   BP 128/88 mmHg  Pulse 99  Temp(Src) 98.4 F (36.9 C) (Oral)  Resp 18  Ht 6\' 1"  (1.854 m)  Wt 175 lb (79.379 kg)  BMI 23.09 kg/m2  SpO2 99% Physical Exam  Constitutional: He is oriented to person, place, and time. He appears well-developed and well-nourished.  Non-toxic appearance.  HENT:  Head: Normocephalic.  Right Ear: Tympanic membrane and external  ear normal.  Left Ear: Tympanic membrane and external ear normal.  Denture plate noted upper gum area Multiple cavities of the lower jaw, left greater than right. Swelling of the gum, no visible abscess. Airway patent. No swelling under the tongue.  Eyes: EOM and lids are normal. Pupils are equal, round, and reactive to light.  Neck: Normal range of motion. Neck supple. Carotid bruit is not present.  Cardiovascular: Normal rate, regular rhythm, normal heart sounds, intact distal pulses and normal pulses.   Pulmonary/Chest: Breath sounds normal.  No respiratory distress.  Abdominal: Soft. Bowel sounds are normal. There is no tenderness. There is no guarding.  Musculoskeletal: Normal range of motion.  Lymphadenopathy:       Head (right side): No submandibular adenopathy present.       Head (left side): No submandibular adenopathy present.    He has no cervical adenopathy.  Neurological: He is alert and oriented to person, place, and time. He has normal strength. No cranial nerve deficit or sensory deficit.  Skin: Skin is warm and dry.  Psychiatric: He has a normal mood and affect. His speech is normal.  Nursing note and vitals reviewed.   ED Course  Procedures (including critical care time) Labs Review Labs Reviewed - No data to display  Imaging Review No results found.   EKG Interpretation None      MDM  Vital signs stable. No evidence for Ludwig's angina No visible abscess. Rx for amoxil and ibuprofen given to the patient.   Final diagnoses:  None    **I have reviewed nursing notes, vital signs, and all appropriate lab and imaging results for this patient.Ivery Quale, PA-C 12/03/14 0021  Devoria Albe, MD 12/03/14 [phone]

## 2014-12-10 ENCOUNTER — Emergency Department (HOSPITAL_COMMUNITY)
Admission: EM | Admit: 2014-12-10 | Discharge: 2014-12-11 | Disposition: A | Discharge status: Home / Self Care | Payer: Self-pay | Attending: Emergency Medicine | Admitting: Emergency Medicine

## 2014-12-10 ENCOUNTER — Encounter (HOSPITAL_COMMUNITY): Payer: Self-pay

## 2014-12-10 DIAGNOSIS — Z8659 Personal history of other mental and behavioral disorders: Secondary | ICD-10-CM | POA: Insufficient documentation

## 2014-12-10 DIAGNOSIS — Z791 Long term (current) use of non-steroidal anti-inflammatories (NSAID): Secondary | ICD-10-CM | POA: Insufficient documentation

## 2014-12-10 DIAGNOSIS — Z792 Long term (current) use of antibiotics: Secondary | ICD-10-CM | POA: Insufficient documentation

## 2014-12-10 DIAGNOSIS — R109 Unspecified abdominal pain: Secondary | ICD-10-CM | POA: Insufficient documentation

## 2014-12-10 DIAGNOSIS — M549 Dorsalgia, unspecified: Secondary | ICD-10-CM | POA: Insufficient documentation

## 2014-12-10 DIAGNOSIS — Z79899 Other long term (current) drug therapy: Secondary | ICD-10-CM | POA: Insufficient documentation

## 2014-12-10 DIAGNOSIS — Z720 Tobacco use: Secondary | ICD-10-CM | POA: Insufficient documentation

## 2014-12-10 DIAGNOSIS — Z8619 Personal history of other infectious and parasitic diseases: Secondary | ICD-10-CM | POA: Insufficient documentation

## 2014-12-10 DIAGNOSIS — Z91040 Latex allergy status: Secondary | ICD-10-CM | POA: Insufficient documentation

## 2014-12-10 DIAGNOSIS — R110 Nausea: Secondary | ICD-10-CM | POA: Insufficient documentation

## 2014-12-10 DIAGNOSIS — R319 Hematuria, unspecified: Secondary | ICD-10-CM | POA: Insufficient documentation

## 2014-12-10 LAB — I-STAT CHEM 8, ED
BUN: 13 mg/dL (ref 6–20)
Calcium, Ion: 1.21 mmol/L (ref 1.12–1.23)
Chloride: 104 mmol/L (ref 101–111)
Creatinine, Ser: 0.8 mg/dL (ref 0.61–1.24)
Glucose, Bld: 97 mg/dL (ref 65–99)
HCT: 45 % (ref 39.0–52.0)
Hemoglobin: 15.3 g/dL (ref 13.0–17.0)
POTASSIUM: 3.9 mmol/L (ref 3.5–5.1)
SODIUM: 142 mmol/L (ref 135–145)
TCO2: 23 mmol/L (ref 0–100)

## 2014-12-10 MED ORDER — ONDANSETRON HCL 4 MG/2ML IJ SOLN
4.0000 mg | Freq: Once | INTRAMUSCULAR | Status: AC
  Administered 2014-12-10: 4 mg via INTRAMUSCULAR
  Filled 2014-12-10: qty 2

## 2014-12-10 MED ORDER — KETOROLAC TROMETHAMINE 30 MG/ML IJ SOLN
30.0000 mg | Freq: Once | INTRAMUSCULAR | Status: AC
  Administered 2014-12-10: 30 mg via INTRAMUSCULAR
  Filled 2014-12-10: qty 1

## 2014-12-10 NOTE — ED Provider Notes (Signed)
CSN: [id number]     Arrival date & time 12/10/14  2257 History  This chart was scribed for Nicholas Octave, MD by Richarda Overlie, ED Scribe. This patient was seen in room APA09/APA09 and the patient's care was started 11:16 PM.     Chief Complaint  Patient presents with  . Flank Pain   The history is provided by the patient. No language interpreter was used.   HPI Comments: Nicholas Nielsen is a 34 y.o. male with a hx of kidney stones who presents to the Emergency Department complaining of right flank pain for the last several weeks which worsened 2 days ago. He states that the pain radiates to his right mid back. Pt states he has experienced some testicular pain which he is not experiencing currently. He reports a few episodes of hematuria as well. Pt states that it feels like his prior episodes of kidney stones but more severe. Pt reports that he has tried taking leftover codeine and ibuprofen without relief, with his last dose of ibuprofen at 8AM this morning. He states that he is currently taking amoxacillin for a dental problem that is improving. Pt reports his last kidney stone episode was approximately 8 months ago. Pt states that he passed all of his prior kidney stones on his own. He reports that he has had a cyst on his right testicle. Pt reports no alleviating or exacerbating factors. He denies vomiting, fever, bowel changes, lower extremity weakness, bowel or bladder incontinence.   Past Medical History  Diagnosis Date  . Bipolar 1 disorder   . Schizo affective schizophrenia   . Testicular cyst   . Back pain   . Hepatitis C   . Kidney stones    History reviewed. No pertinent past surgical history. Family History  Problem Relation Age of Onset  . Stroke Father   . Hypertension Father    History  Substance Use Topics  . Smoking status: Current Every Day Smoker -- 1.00 packs/day for 15 years    Types: Cigarettes  . Smokeless tobacco: Never Used  . Alcohol Use: Yes    Review  of Systems  Constitutional: Negative for fever.  Gastrointestinal: Positive for nausea. Negative for vomiting and blood in stool.  Genitourinary: Positive for hematuria, flank pain and testicular pain.  Musculoskeletal: Positive for back pain.  Neurological: Negative for weakness.  All other systems reviewed and are negative.   Allergies  Latex  Home Medications   Prior to Admission medications   Medication Sig Start Date End Date Taking? Authorizing Provider  acetaminophen-codeine (TYLENOL #3) 300-30 MG per tablet Take 1-2 tablets by mouth every 6 (six) hours as needed. 06/14/14  Yes Ivery Quale, PA-C  amoxicillin (AMOXIL) 500 MG capsule Take 1 capsule (500 mg total) by mouth 3 (three) times daily. 12/03/14  Yes Ivery Quale, PA-C  ibuprofen (ADVIL,MOTRIN) 800 MG tablet Take 1 tablet (800 mg total) by mouth 3 (three) times daily. 12/03/14  Yes Ivery Quale, PA-C  Multiple Vitamin (MULTIVITAMIN WITH MINERALS) TABS tablet Take 1 tablet by mouth daily.   Yes Historical Provider, MD  diclofenac (VOLTAREN) 75 MG EC tablet Take 1 tablet (75 mg total) by mouth 2 (two) times daily. 06/14/14   Ivery Quale, PA-C  HYDROcodone-acetaminophen (NORCO) 5-325 MG per tablet Take 1-2 tablets by mouth every 6 (six) hours as needed (for pain). Patient not taking: Reported on 06/14/2014 04/10/14   Paula Libra, MD  HYDROcodone-acetaminophen (NORCO/VICODIN) 5-325 MG per tablet Take 1 tablet by mouth every  4 (four) hours as needed. Patient not taking: Reported on 06/14/2014 03/02/14   Janne Napoleon, NP  naproxen (NAPROSYN) 500 MG tablet Take 1 tablet (500 mg total) by mouth 2 (two) times daily. 12/11/14   Nicholas Octave, MD  ondansetron (ZOFRAN) 4 MG tablet Take 1 tablet (4 mg total) by mouth every 6 (six) hours. 12/11/14   Nicholas Octave, MD  sulfamethoxazole-trimethoprim (SEPTRA DS) 800-160 MG per tablet Take 1 tablet by mouth 2 (two) times daily. For 10 days Patient not taking: Reported on 06/14/2014 03/31/14    Tammy Triplett, PA-C   BP 123/95 mmHg  Pulse 75  Temp(Src) 97.6 F (36.4 C) (Oral)  Resp 16  Ht  (1.854 m)  Wt 180 lb (81.647 kg)  BMI 23.75 kg/m2  SpO2 100% Physical Exam  Constitutional: He is oriented to person, place, and time. He appears well-developed and well-nourished. No distress.  Appears comfortable.   HENT:  Head: Normocephalic and atraumatic.  Mouth/Throat: Oropharynx is clear and moist. No oropharyngeal exudate.  Eyes: Conjunctivae and EOM are normal. Pupils are equal, round, and reactive to light.  Neck: Normal range of motion. Neck supple.  No meningismus.  Cardiovascular: Normal rate, regular rhythm, normal heart sounds and intact distal pulses.   No murmur heard. Pulmonary/Chest: Effort normal and breath sounds normal. No respiratory distress.  Abdominal: Soft. There is no tenderness. There is no rebound and no guarding.  Right CVA tenderness. No RLQ tenderness.  Genitourinary:  No testicular tenderness.   Musculoskeletal: Normal range of motion. He exhibits no edema or tenderness.  Neurological: He is alert and oriented to person, place, and time. No cranial nerve deficit. He exhibits normal muscle tone. Coordination normal.  No ataxia on finger to nose bilaterally. No pronator drift. 5/5 strength throughout. CN 2-12 intact. Negative Romberg. Equal grip strength. Sensation intact. Gait is normal.   Skin: Skin is warm.  Psychiatric: He has a normal mood and affect. His behavior is normal.  Nursing note and vitals reviewed.   ED Course  Procedures   DIAGNOSTIC STUDIES: Oxygen Saturation is 100% on RA, normal by my interpretation.    COORDINATION OF CARE: 11:23 PM Discussed treatment plan with pt at bedside and pt agreed to plan.   Labs Review Labs Reviewed  URINALYSIS, ROUTINE W REFLEX MICROSCOPIC (NOT AT [facility]) - Abnormal; Notable for the following:    Hgb urine dipstick TRACE (*)    All other components within normal limits  URINE  MICROSCOPIC-ADD ON - Abnormal; Notable for the following:    Bacteria, UA MANY (*)    All other components within normal limits  URINE CULTURE  I-STAT CHEM 8, ED    Imaging Review Ct Renal Stone Study  12/11/2014   CLINICAL DATA:  RIGHT flank pain for several weeks. Worsening 2 days ago.  EXAM: CT ABDOMEN AND PELVIS WITHOUT CONTRAST  TECHNIQUE: Multidetector CT imaging of the abdomen and pelvis was performed following the standard protocol without IV contrast.  COMPARISON:  08/10/2013  FINDINGS: No lung base lesion.  No free fluid or free air.  BILATERAL intrarenal calculi are redemonstrated. There are no ureteral calculi. Normal appearance of the urinary bladder. Overall stone burden appears improved on the RIGHT, as the previously identified 3 mm stone has passed. There may be slight increase in stone burden on the LEFT, but again without renal obstruction.  Within limits of evaluation for the unenhanced technique, abdominal this or appear normal. There is moderate stool throughout colon. No appendiceal  inflammation; the previously noted high attenuation material in the appendix has normalized. Reproductive organs unremarkable. No abdominal wall or osseous abnormality.  IMPRESSION: Nonobstructive nephrolithiasis. Previous 3 mm RIGHT intrarenal calculus on the study from 2015 is no longer present, and potentially could have recently passed. There are no ureteral calculi.  No Evidence for obstructive uropathy. No acute intra-abdominal findings.   Electronically Signed   By: Davonna Belling M.D.   On: 12/11/2014 01:00     EKG Interpretation None      MDM   Final diagnoses:  Flank pain   One month of flank pain worse in the past couple days. Associated with dysuria. No fevers or vomiting. Currently on amoxicillin for dental infection.  Patient not in distress. Urine shows trace blood with many bacteria.  CT shows nephrolithiasis.  No ureterolithiasis.  Urine culture sent, pain has resolved  with toradol, will avoid narcotics. Patient has 2 more days of amoxicillin left which he has been taking for his dental infection.  This should cover any urinary pathogen. No evidence of infected kidney stone.  Pain well controlled in the ED. He is stable for outpatient follow-up. Suspects he may have passed a kidney stone.   Nicholas Octave, MD 12/11/14 0962

## 2014-12-10 NOTE — ED Notes (Signed)
Pt reports chronic kidney stones, states right flank pain for about a month worse in the past couple of days.

## 2014-12-11 ENCOUNTER — Emergency Department (HOSPITAL_COMMUNITY): Payer: Self-pay

## 2014-12-11 LAB — URINALYSIS, ROUTINE W REFLEX MICROSCOPIC
Bilirubin Urine: NEGATIVE
Glucose, UA: NEGATIVE mg/dL
Ketones, ur: NEGATIVE mg/dL
Leukocytes, UA: NEGATIVE
Nitrite: NEGATIVE
Protein, ur: NEGATIVE mg/dL
Specific Gravity, Urine: 1.015 (ref 1.005–1.030)
Urobilinogen, UA: 0.2 mg/dL (ref 0.0–1.0)
pH: 6.5 (ref 5.0–8.0)

## 2014-12-11 LAB — URINE MICROSCOPIC-ADD ON

## 2014-12-11 MED ORDER — ONDANSETRON HCL 4 MG PO TABS: 4.0000 mg | ORAL_TABLET | Freq: Four times a day (QID) | ORAL | Status: DC

## 2014-12-11 MED ORDER — NAPROXEN 500 MG PO TABS: 500.0000 mg | ORAL_TABLET | Freq: Two times a day (BID) | ORAL | Status: DC

## 2014-12-11 NOTE — ED Notes (Signed)
Pt alert & oriented x4, stable gait. Patient given discharge instructions, paperwork & prescription(s). Patient  instructed to stop at the registration desk to finish any additional paperwork. Patient verbalized understanding. Pt left department w/ no further questions. 

## 2014-12-11 NOTE — Discharge Instructions (Signed)
Flank Pain °Flank pain refers to pain that is located on the side of the body between the upper abdomen and the back. The pain may occur over a short period of time (acute) or may be long-term or reoccurring (chronic). It may be mild or severe. Flank pain can be caused by many things. °CAUSES  °Some of the more common causes of flank pain include: °· Muscle strains.   °· Muscle spasms.   °· A disease of your spine (vertebral disk disease).   °· A lung infection (pneumonia).   °· Fluid around your lungs (pulmonary edema).   °· A kidney infection.   °· Kidney stones.   °· A very painful skin rash caused by the chickenpox virus (shingles).   °· Gallbladder disease.   °HOME CARE INSTRUCTIONS  °Home care will depend on the cause of your pain. In general, °· Rest as directed by your caregiver. °· Drink enough fluids to keep your urine clear or pale yellow. °· Only take over-the-counter or prescription medicines as directed by your caregiver. Some medicines may help relieve the pain. °· Tell your caregiver about any changes in your pain. °· Follow up with your caregiver as directed. °SEEK IMMEDIATE MEDICAL CARE IF:  °· Your pain is not controlled with medicine.   °· You have new or worsening symptoms. °· Your pain increases.   °· You have abdominal pain.   °· You have shortness of breath.   °· You have persistent nausea or vomiting.   °· You have swelling in your abdomen.   °· You feel faint or pass out.   °· You have blood in your urine. °· You have a fever or persistent symptoms for more than 2-3 days. °· You have a fever and your symptoms suddenly get worse. °MAKE SURE YOU:  °· Understand these instructions. °· Will watch your condition. °· Will get help right away if you are not doing well or get worse. °Document Released: 08/10/2005 Document Revised: 03/13/2012 Document Reviewed: 02/01/2012 °Exit[employer]® Patient Information ©2015 [employer]. This information is not intended to replace advice given to you by your  health care provider. Make sure you discuss any questions you have with your health care provider. ° °

## 2014-12-12 LAB — URINE CULTURE
Colony Count: NO GROWTH
Culture: NO GROWTH

## 2014-12-23 ENCOUNTER — Emergency Department (HOSPITAL_COMMUNITY)
Admission: EM | Admit: 2014-12-23 | Discharge: 2014-12-24 | Disposition: A | Discharge status: Home / Self Care | Payer: Self-pay | Attending: Emergency Medicine | Admitting: Emergency Medicine

## 2014-12-23 ENCOUNTER — Encounter (HOSPITAL_COMMUNITY): Payer: Self-pay | Admitting: *Deleted

## 2014-12-23 DIAGNOSIS — Z720 Tobacco use: Secondary | ICD-10-CM | POA: Insufficient documentation

## 2014-12-23 DIAGNOSIS — Z87438 Personal history of other diseases of male genital organs: Secondary | ICD-10-CM | POA: Insufficient documentation

## 2014-12-23 DIAGNOSIS — K088 Other specified disorders of teeth and supporting structures: Secondary | ICD-10-CM | POA: Insufficient documentation

## 2014-12-23 DIAGNOSIS — Z91040 Latex allergy status: Secondary | ICD-10-CM | POA: Insufficient documentation

## 2014-12-23 DIAGNOSIS — K029 Dental caries, unspecified: Secondary | ICD-10-CM | POA: Insufficient documentation

## 2014-12-23 DIAGNOSIS — Z87448 Personal history of other diseases of urinary system: Secondary | ICD-10-CM | POA: Insufficient documentation

## 2014-12-23 DIAGNOSIS — K0889 Other specified disorders of teeth and supporting structures: Secondary | ICD-10-CM

## 2014-12-23 DIAGNOSIS — Z8659 Personal history of other mental and behavioral disorders: Secondary | ICD-10-CM | POA: Insufficient documentation

## 2014-12-23 DIAGNOSIS — Z8619 Personal history of other infectious and parasitic diseases: Secondary | ICD-10-CM | POA: Insufficient documentation

## 2014-12-23 NOTE — ED Notes (Signed)
Pt c/o pain to a tooth on the lower right side of his mouth.

## 2014-12-24 MED ORDER — AMOXICILLIN 500 MG PO CAPS: 500.0000 mg | ORAL_CAPSULE | Freq: Three times a day (TID) | ORAL | Status: DC

## 2014-12-24 MED ORDER — ACETAMINOPHEN-CODEINE #3 300-30 MG PO TABS: 1.0000 | ORAL_TABLET | Freq: Four times a day (QID) | ORAL | Status: DC | PRN

## 2014-12-24 MED ORDER — PROMETHAZINE HCL 12.5 MG PO TABS
12.5000 mg | ORAL_TABLET | Freq: Once | ORAL | Status: AC
  Administered 2014-12-24: 12.5 mg via ORAL
  Filled 2014-12-24: qty 1

## 2014-12-24 MED ORDER — AMOXICILLIN 250 MG PO CAPS
500.0000 mg | ORAL_CAPSULE | Freq: Once | ORAL | Status: AC
  Administered 2014-12-24: 500 mg via ORAL
  Filled 2014-12-24: qty 2

## 2014-12-24 MED ORDER — IBUPROFEN 800 MG PO TABS
800.0000 mg | ORAL_TABLET | Freq: Once | ORAL | Status: AC
  Administered 2014-12-24: 800 mg via ORAL
  Filled 2014-12-24: qty 1

## 2014-12-24 MED ORDER — ACETAMINOPHEN-CODEINE #3 300-30 MG PO TABS
2.0000 | ORAL_TABLET | Freq: Once | ORAL | Status: AC
  Administered 2014-12-24: 2 via ORAL
  Filled 2014-12-24: qty 2

## 2014-12-24 MED ORDER — IBUPROFEN 800 MG PO TABS: 800.0000 mg | ORAL_TABLET | Freq: Three times a day (TID) | ORAL | Status: DC

## 2014-12-24 NOTE — ED Provider Notes (Signed)
CSN: [id number]     Arrival date & time 12/23/14  2323 History   First MD Initiated Contact with Patient 12/23/14 2337     Chief Complaint  Patient presents with  . Dental Pain     (Consider location/radiation/quality/duration/timing/severity/associated sxs/prior Treatment) Patient is a 34 y.o. male presenting with tooth pain. The history is provided by the patient.  Dental Pain Location:  Lower Quality:  Aching and throbbing Severity:  Moderate Onset quality:  Gradual Duration: acute on chronic problem. Timing:  Intermittent Progression:  Worsening Chronicity:  Chronic Context: dental caries and poor dentition   Relieved by:  Nothing Worsened by:  Cold food/drink Ineffective treatments:  Acetaminophen Associated symptoms: no drooling, no facial swelling, no fever and no trismus   Risk factors: lack of dental care   Risk factors: no diabetes and no immunosuppression     Past Medical History  Diagnosis Date  . Bipolar 1 disorder   . Schizo affective schizophrenia   . Testicular cyst   . Back pain   . Hepatitis C   . Kidney stones    History reviewed. No pertinent past surgical history. Family History  Problem Relation Age of Onset  . Stroke Father   . Hypertension Father    History  Substance Use Topics  . Smoking status: Current Every Day Smoker -- 1.00 packs/day for 15 years    Types: Cigarettes  . Smokeless tobacco: Never Used  . Alcohol Use: Yes    Review of Systems  Constitutional: Negative for fever.  HENT: Positive for dental problem. Negative for drooling and facial swelling.   Psychiatric/Behavioral:       Bipolar  All other systems reviewed and are negative.     Allergies  Latex  Home Medications   Prior to Admission medications   Medication Sig Start Date End Date Taking? Authorizing Provider  acetaminophen-codeine (TYLENOL #3) 300-30 MG per tablet Take 1-2 tablets by mouth every 6 (six) hours as needed. 06/14/14  Yes Ivery Quale, PA-C   naproxen (NAPROSYN) 500 MG tablet Take 1 tablet (500 mg total) by mouth 2 (two) times daily. 12/11/14  Yes [name], MD  HYDROcodone-acetaminophen (NORCO) 5-325 MG per tablet Take 1-2 tablets by mouth every 6 (six) hours as needed (for pain). Patient not taking: Reported on 06/14/2014 04/10/14   Paula Libra, MD  HYDROcodone-acetaminophen (NORCO/VICODIN) 5-325 MG per tablet Take 1 tablet by mouth every 4 (four) hours as needed. Patient not taking: Reported on 06/14/2014 03/02/14   Janne Napoleon, NP  ibuprofen (ADVIL,MOTRIN) 800 MG tablet Take 1 tablet (800 mg total) by mouth 3 (three) times daily. 12/03/14   Ivery Quale, PA-C  Multiple Vitamin (MULTIVITAMIN WITH MINERALS) TABS tablet Take 1 tablet by mouth daily.    Historical Provider, MD  ondansetron (ZOFRAN) 4 MG tablet Take 1 tablet (4 mg total) by mouth every 6 (six) hours. 12/11/14   [name], MD  sulfamethoxazole-trimethoprim (SEPTRA DS) 800-160 MG per tablet Take 1 tablet by mouth 2 (two) times daily. For 10 days Patient not taking: Reported on 06/14/2014 03/31/14   Tammy Triplett, PA-C   BP 122/78 mmHg  Pulse 90  Temp(Src) 97.8 F (36.6 C) (Oral)  Resp 18  Ht  (1.854 m)  Wt 174 lb (78.926 kg)  BMI 22.96 kg/m2  SpO2 100% Physical Exam  Constitutional: He is oriented to person, place, and time. He appears well-developed and well-nourished.  Non-toxic appearance.  HENT:  Head: Normocephalic.  Right Ear: Tympanic membrane and  external ear normal.  Left Ear: Tympanic membrane and external ear normal.  Multiple dental cavities of the lower jaw. Mild to mod gum swelling without visible abscess. No swelling under the tongue. Airway patient. Denture plate at the upper gum area.  Eyes: EOM and lids are normal. Pupils are equal, round, and reactive to light.  Neck: Normal range of motion. Neck supple. Carotid bruit is not present.  Cardiovascular: Normal rate, regular rhythm, normal heart sounds, intact distal pulses and  normal pulses.   Pulmonary/Chest: Breath sounds normal. No respiratory distress.  Abdominal: Soft. Bowel sounds are normal. There is no tenderness. There is no guarding.  Musculoskeletal: Normal range of motion.  Lymphadenopathy:       Head (right side): No submandibular adenopathy present.       Head (left side): No submandibular adenopathy present.    He has no cervical adenopathy.  Neurological: He is alert and oriented to person, place, and time. He has normal strength. No cranial nerve deficit or sensory deficit.  Skin: Skin is warm and dry.  Psychiatric: He has a normal mood and affect. His speech is normal.  Nursing note and vitals reviewed.   ED Course  Procedures (including critical care time) Labs Review Labs Reviewed - No data to display  Imaging Review No results found.   EKG Interpretation None      MDM  Pt states he may have a dental appointment next week. Vital signs stable. No evidence for Ludwig's Angina.  No visible abscess Rx for amoxil, tylenol codeine, and ibuprofen given to the patient.   Final diagnoses:  None    **I have reviewed nursing notes, vital signs, and all appropriate lab and imaging results for this patient.Ivery Quale, PA-C 12/24/14 1246  Devoria Albe, MD 01/04/15 [phone]

## 2014-12-24 NOTE — Discharge Instructions (Signed)

## 2015-02-16 ENCOUNTER — Encounter (HOSPITAL_COMMUNITY): Payer: Self-pay | Admitting: Emergency Medicine

## 2015-02-16 ENCOUNTER — Emergency Department (HOSPITAL_COMMUNITY): Payer: Self-pay

## 2015-02-16 ENCOUNTER — Emergency Department (HOSPITAL_COMMUNITY)
Admission: EM | Admit: 2015-02-16 | Discharge: 2015-02-16 | Disposition: A | Discharge status: Home / Self Care | Payer: Self-pay | Attending: Emergency Medicine | Admitting: Emergency Medicine

## 2015-02-16 DIAGNOSIS — Z91040 Latex allergy status: Secondary | ICD-10-CM | POA: Insufficient documentation

## 2015-02-16 DIAGNOSIS — Z8619 Personal history of other infectious and parasitic diseases: Secondary | ICD-10-CM | POA: Insufficient documentation

## 2015-02-16 DIAGNOSIS — R509 Fever, unspecified: Secondary | ICD-10-CM | POA: Insufficient documentation

## 2015-02-16 DIAGNOSIS — R112 Nausea with vomiting, unspecified: Secondary | ICD-10-CM | POA: Insufficient documentation

## 2015-02-16 DIAGNOSIS — Z720 Tobacco use: Secondary | ICD-10-CM | POA: Insufficient documentation

## 2015-02-16 DIAGNOSIS — Z87442 Personal history of urinary calculi: Secondary | ICD-10-CM | POA: Insufficient documentation

## 2015-02-16 DIAGNOSIS — Z791 Long term (current) use of non-steroidal anti-inflammatories (NSAID): Secondary | ICD-10-CM | POA: Insufficient documentation

## 2015-02-16 DIAGNOSIS — R109 Unspecified abdominal pain: Secondary | ICD-10-CM | POA: Insufficient documentation

## 2015-02-16 DIAGNOSIS — Z8659 Personal history of other mental and behavioral disorders: Secondary | ICD-10-CM | POA: Insufficient documentation

## 2015-02-16 DIAGNOSIS — Z87438 Personal history of other diseases of male genital organs: Secondary | ICD-10-CM | POA: Insufficient documentation

## 2015-02-16 LAB — URINE MICROSCOPIC-ADD ON

## 2015-02-16 LAB — CBC
HEMATOCRIT: 43.5 % (ref 39.0–52.0)
Hemoglobin: 14.7 g/dL (ref 13.0–17.0)
MCH: 32.3 pg (ref 26.0–34.0)
MCHC: 33.8 g/dL (ref 30.0–36.0)
MCV: 95.6 fL (ref 78.0–100.0)
Platelets: 172 10*3/uL (ref 150–400)
RBC: 4.55 MIL/uL (ref 4.22–5.81)
RDW: 13.6 % (ref 11.5–15.5)
WBC: 10 10*3/uL (ref 4.0–10.5)

## 2015-02-16 LAB — URINALYSIS, ROUTINE W REFLEX MICROSCOPIC
Bilirubin Urine: NEGATIVE
Glucose, UA: NEGATIVE mg/dL
Ketones, ur: NEGATIVE mg/dL
Leukocytes, UA: NEGATIVE
NITRITE: NEGATIVE
Protein, ur: NEGATIVE mg/dL
SPECIFIC GRAVITY, URINE: 1.025 (ref 1.005–1.030)
UROBILINOGEN UA: 0.2 mg/dL (ref 0.0–1.0)
pH: 5.5 (ref 5.0–8.0)

## 2015-02-16 MED ORDER — ONDANSETRON 4 MG PO TBDP
4.0000 mg | ORAL_TABLET | Freq: Three times a day (TID) | ORAL | Status: DC | PRN
  Administered 2015-02-16: 4 mg via ORAL
  Filled 2015-02-16: qty 1

## 2015-02-16 MED ORDER — KETOROLAC TROMETHAMINE 30 MG/ML IJ SOLN
30.0000 mg | Freq: Once | INTRAMUSCULAR | Status: AC
  Administered 2015-02-16: 30 mg via INTRAMUSCULAR
  Filled 2015-02-16: qty 1

## 2015-02-16 NOTE — ED Notes (Signed)
PT c/o right flank pain intermittent with nausea/vomiting x3 this am.

## 2015-02-16 NOTE — Discharge Instructions (Signed)
Your lab work and imaging showed nothing that would explain your right sided flank pain. We recommend following up with your primary doctor and a Urologist. Please continue to take Ibuprofen  for your pain. I suspect it will continue to improve on its own.   Flank Pain Flank pain refers to pain that is located on the side of the body between the upper abdomen and the back. The pain may occur over a short period of time (acute) or may be long-term or reoccurring (chronic). It may be mild or severe. Flank pain can be caused by many things. CAUSES  Some of the more common causes of flank pain include:  Muscle strains.   Muscle spasms.   A kidney infection.   Kidney stones.   A very painful skin rash caused by the chickenpox virus (shingles).   Gallbladder disease.  HOME CARE INSTRUCTIONS  Home care will depend on the cause of your pain. In general,  Rest as directed by your caregiver.  Drink enough fluids to keep your urine clear or pale yellow.  Only take over-the-counter or prescription medicines as directed by your caregiver. Some medicines may help relieve the pain.  Tell your caregiver about any changes in your pain.  Follow up with your caregiver as directed. SEEK IMMEDIATE MEDICAL CARE IF:   Your pain is not controlled with medicine.   You have new or worsening symptoms.  Your pain increases.   You have abdominal pain.   You have shortness of breath.   You have persistent nausea or vomiting.   You have swelling in your abdomen.   You feel faint or pass out.   You have blood in your urine.  You have a fever or persistent symptoms for more than 2-3 days.  You have a fever and your symptoms suddenly get worse. MAKE SURE YOU:   Understand these instructions.  Will watch your condition.  Will get help right away if you are not doing well or get worse. Document Released: 08/10/2005 Document Revised: 03/13/2012 Document Reviewed:  02/01/2012 [facility] Patient Information 2015 [place], [place]. This information is not intended to replace advice given to you by your health care provider. Make sure you discuss any questions you have with your health care provider.

## 2015-02-16 NOTE — ED Provider Notes (Signed)
CSN: [id number]     Arrival date & time 02/16/15  0941 History   First MD Initiated Contact with Patient 02/16/15 [phone]     Chief Complaint  Patient presents with  . Flank Pain   HPI Nicholas Nielsen is a 34yo male presenting today for flank pain. Has also vomited three times this morning. States he has had intermittent right side abdominal and flank pain off and on since 6/9 when he presented to ED and was diagnosed with nephrolithiasis on CT. Pain became more constant one week ago. States pain is exactly the same as previous kidney stones, located in right flank and down to right testicle. Normally has testicular pain with kidney stones. Has had many stones over the last ten years, mostly on right side but a few on the left. Also states he feels warm, but did not take his temperature. States he cannot afford Urology visits since he is having to pay child support for several kids.   Past Medical History  Diagnosis Date  . Bipolar 1 disorder   . Schizo affective schizophrenia   . Testicular cyst   . Back pain   . Hepatitis C   . Kidney stones    History reviewed. No pertinent past surgical history. Family History  Problem Relation Age of Onset  . Stroke Father   . Hypertension Father    Social History  Substance Use Topics  . Smoking status: Current Every Day Smoker -- 1.00 packs/day for 15 years    Types: Cigarettes  . Smokeless tobacco: Never Used  . Alcohol Use: Yes    Review of Systems  Constitutional: Positive for fever.  Gastrointestinal: Positive for nausea, vomiting and abdominal pain. Negative for diarrhea and constipation.  Genitourinary: Positive for flank pain and testicular pain.      Allergies  Latex  Home Medications   Prior to Admission medications   Medication Sig Start Date End Date Taking? Authorizing Provider  acetaminophen (TYLENOL) 500 MG tablet Take 1,000 mg by mouth every 6 (six) hours as needed.   Yes Historical Provider, MD  ibuprofen (ADVIL,MOTRIN)  800 MG tablet Take 1 tablet (800 mg total) by mouth 3 (three) times daily. 12/24/14  Yes Ivery Quale, PA-C  Multiple Vitamin (MULTIVITAMIN WITH MINERALS) TABS tablet Take 1 tablet by mouth daily.   Yes Historical Provider, MD  ondansetron (ZOFRAN) 4 MG tablet Take 1 tablet (4 mg total) by mouth every 6 (six) hours. 12/11/14  Yes [name], MD  acetaminophen-codeine (TYLENOL #3) 300-30 MG per tablet Take 1-2 tablets by mouth every 6 (six) hours as needed for moderate pain. Patient not taking: Reported on 02/16/2015 12/24/14   Ivery Quale, PA-C  amoxicillin (AMOXIL) 500 MG capsule Take 1 capsule (500 mg total) by mouth 3 (three) times daily. Patient not taking: Reported on 02/16/2015 12/24/14   Ivery Quale, PA-C  naproxen (NAPROSYN) 500 MG tablet Take 1 tablet (500 mg total) by mouth 2 (two) times daily. Patient not taking: Reported on 02/16/2015 12/11/14   [name], MD   BP 115/83 mmHg  Pulse 81  Temp(Src) 98.5 F (36.9 C) (Oral)  Resp 18  Ht 6\' 1"  (1.854 m)  Wt 180 lb (81.647 kg)  BMI 23.75 kg/m2  SpO2 97% Physical Exam  Constitutional: He is oriented to person, place, and time. He appears well-developed and well-nourished.  Cardiovascular: Normal rate and regular rhythm.  Exam reveals no gallop and no friction rub.   No murmur heard. Pulmonary/Chest: Effort normal. No respiratory  distress. He has no wheezes. He has no rales.  Abdominal: Soft. Bowel sounds are normal. He exhibits no distension. There is no rebound.  Right flank pain, no tenderness over abdomen  Musculoskeletal: He exhibits no edema.  Neurological: He is alert and oriented to person, place, and time.  Skin: Skin is warm and dry. No rash noted. He is not diaphoretic.  Psychiatric: He has a normal mood and affect. His behavior is normal.    ED Course  Procedures (including critical care time) Labs Review Labs Reviewed  URINALYSIS, ROUTINE W REFLEX MICROSCOPIC (NOT AT [facility])  CBC    Imaging Review Ct  Renal Stone Study  02/16/2015   CLINICAL DATA:  Left flank pain and testicular pain for 2 weeks. Hematuria. History kidney stones. History of hepatitis-C.  EXAM: CT ABDOMEN AND PELVIS WITHOUT CONTRAST  TECHNIQUE: Multidetector CT imaging of the abdomen and pelvis was performed following the standard protocol without IV contrast.  COMPARISON:  12/11/2014  FINDINGS: Lower chest: Clear lung bases. Normal heart size without pericardial or pleural effusion.  Hepatobiliary: Normal liver. Normal gallbladder, without biliary ductal dilatation.  Pancreas: Normal, without mass or ductal dilatation.  Spleen: Normal  Adrenals/Urinary Tract: Normal adrenal glands. Left renal collecting system calculi x2. Maximally 3 mm. No hydronephrosis. No hydroureter or ureteric calculi. No bladder calculi.  Stomach/Bowel: Normal stomach, without wall thickening. Normal colon, appendix, and terminal ileum. Normal small bowel.  Vascular/Lymphatic: Mild but significantly age advanced aortic and branch vessel atherosclerosis. No abdominopelvic adenopathy.  Reproductive: Normal prostate.  Other: No significant free fluid.  Musculoskeletal: Congenitally short lumbar pedicles. Mild disc bulges at L4-5 and L5-S1.  IMPRESSION: 1. Left nephrolithiasis.  No obstructive uropathy or distal stone. 2. No other explanation for patient's symptoms. 3. Lumbar spondylosis. 4. Significantly age advanced atherosclerosis.   Electronically Signed   By: Jeronimo Greaves M.D.   On: 02/16/2015 11:51   I, Garry Heater, personally reviewed and evaluated these images and lab results as part of my medical decision-making.   EKG Interpretation None      MDM   Final diagnoses:  Right flank pain  Will obtain CBC and Urinalysis. Toradol given. Urinalysis with trace protein and small blood, 7-11 RBC. CT renal stone study with left nephrolithiasis but no right sided stones noted. Vitals stable. Stable for discharge with follow up with PCP. Continue OTC pain  medication as needed.    Bryant, DO 02/16/15 [address] [place], [place] 02/16/15 1200  Eber Hong, MD 02/16/15 [phone]

## 2015-02-16 NOTE — ED Notes (Signed)
Pain to right side.  History of kidney stones.  Rates pain 5/10.  Having vomiting.  Vomited 3 times this am.

## 2015-05-12 IMAGING — CT CT ABD-PELV W/O CM
2 of 3 series · 17 of 46 positions shown, 19 images · non-contrast
Comparison: 08/16/2009

CLINICAL DATA: Left flank pain and history of kidney stones.

EXAM:
CT ABDOMEN AND PELVIS WITHOUT CONTRAST
TECHNIQUE: Multidetector CT imaging of the abdomen and pelvis was performed
following the standard protocol without intravenous contrast.

[Series 2: standard/full over (age)lbs 5.0 · axial · 0.66mm/px · z∈[-416,-1]mm · 14 of 97 slices shown, 16 images]
[im 7/97  soft-tissue]
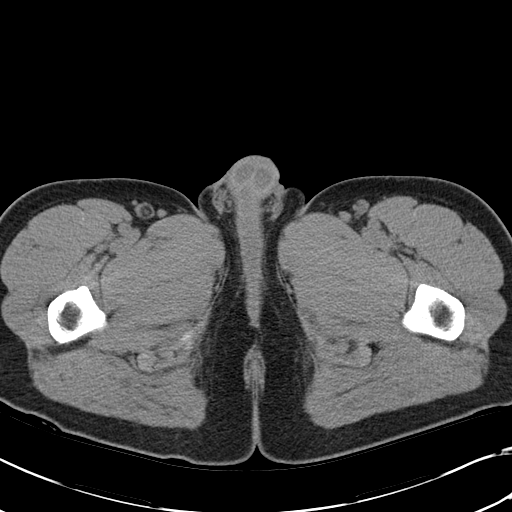
[im 7/97  bone]
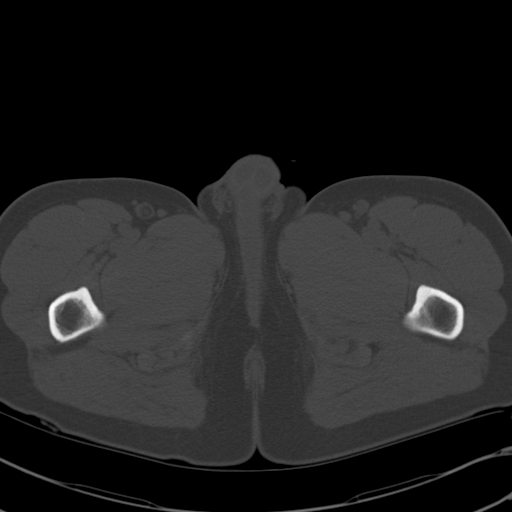
[im 13/97  soft-tissue]
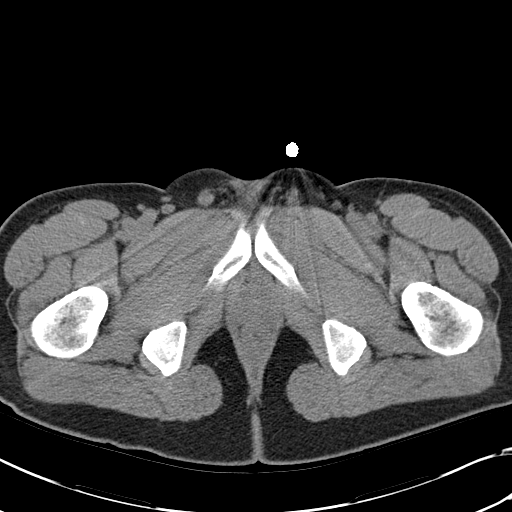
[im 19/97  soft-tissue]
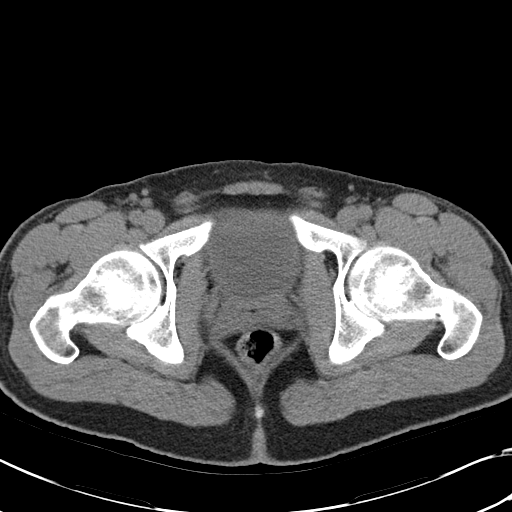
[im 25/97  soft-tissue]
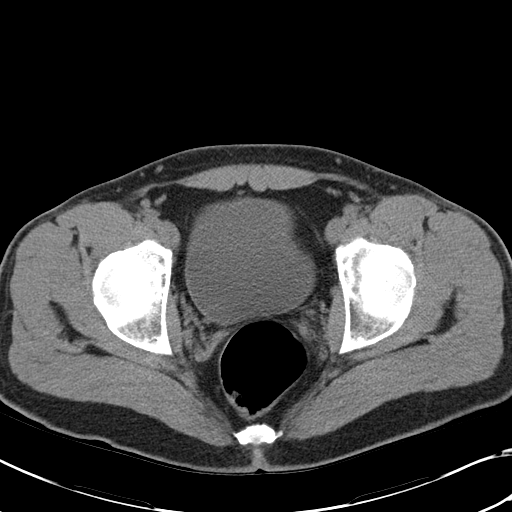
[im 31/97  soft-tissue]
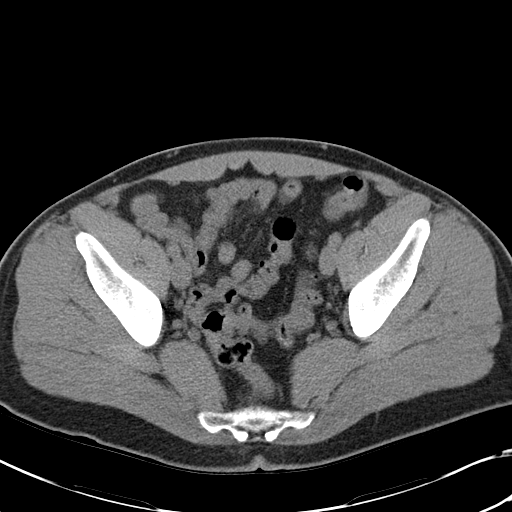
[im 38/97  soft-tissue]
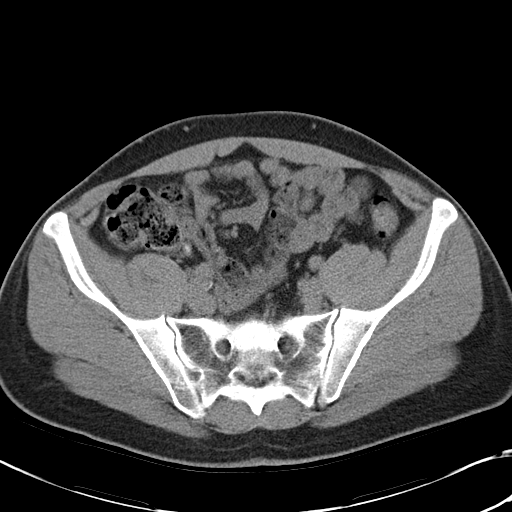
[im 44/97  soft-tissue]
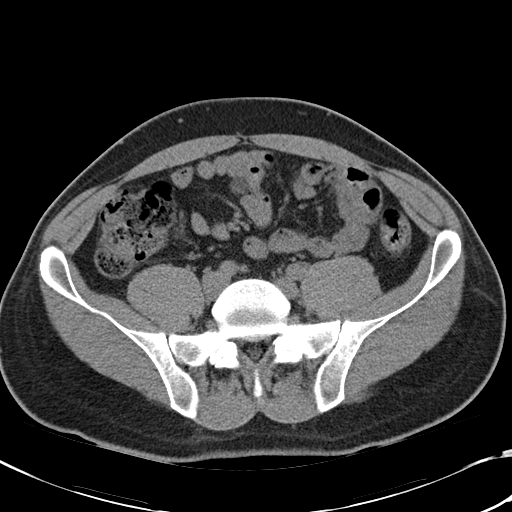
[im 53/97  soft-tissue]
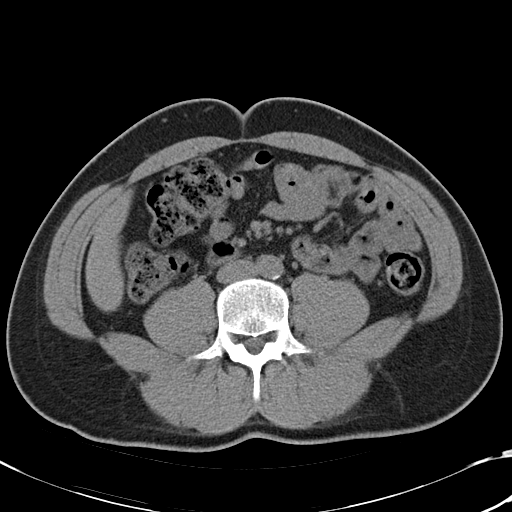
[im 59/97  soft-tissue]
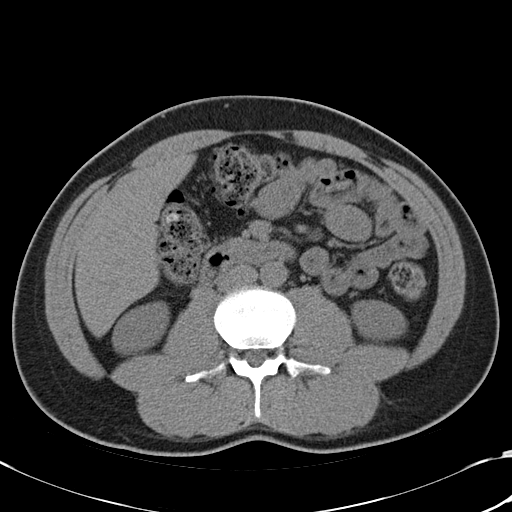
[im 59/97  bone]
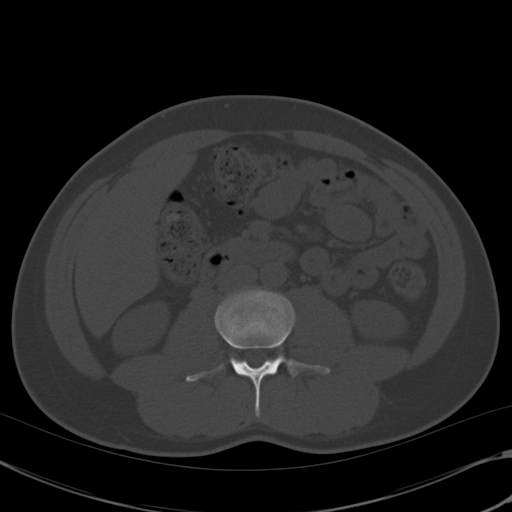
[im 66/97  soft-tissue]
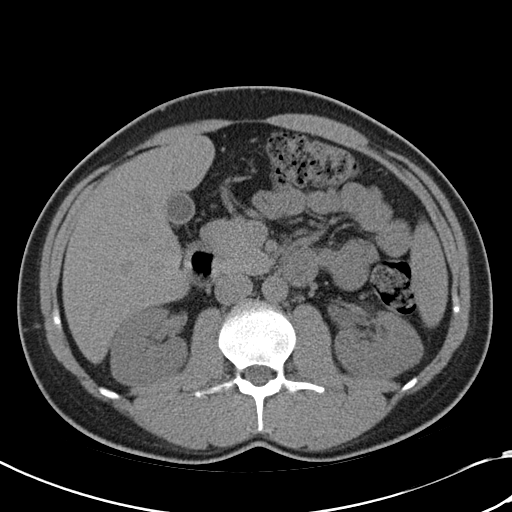
[im 72/97  soft-tissue]
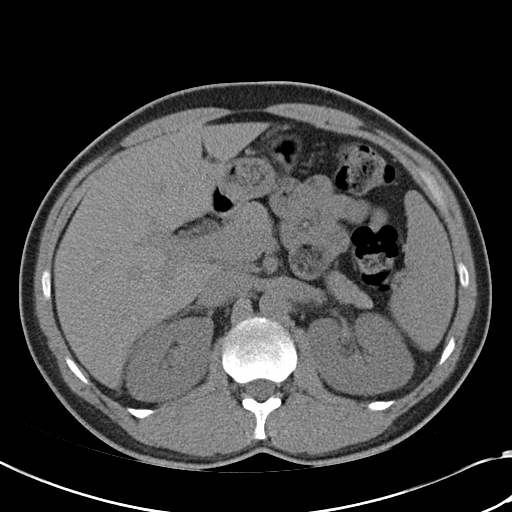
[im 78/97  soft-tissue]
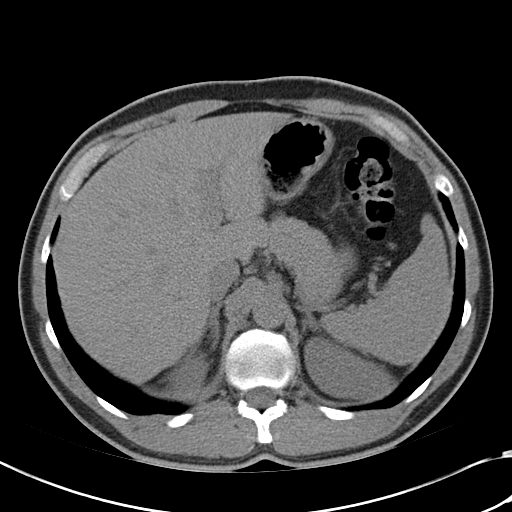
[im 84/97  soft-tissue]
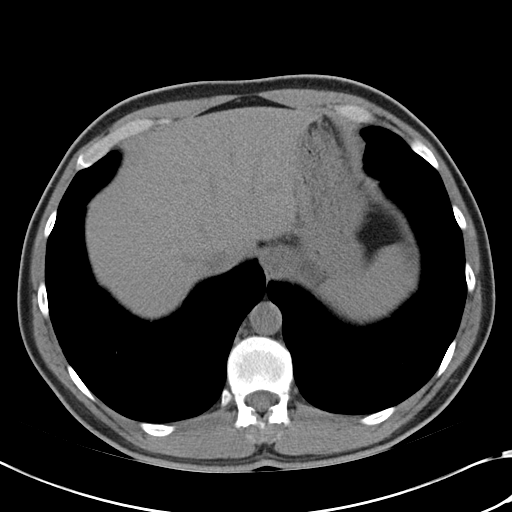
[im 90/97  soft-tissue]
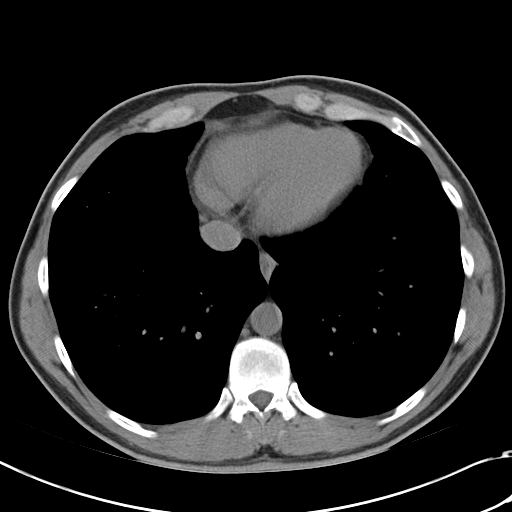

[Series 4: mpr coronal · coronal · 0.95mm/px · 3 of 85 slices shown]
[im 29/85  soft-tissue]
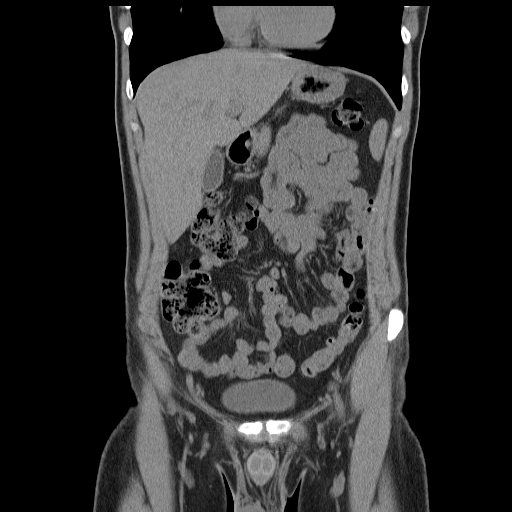
[im 38/85  soft-tissue]
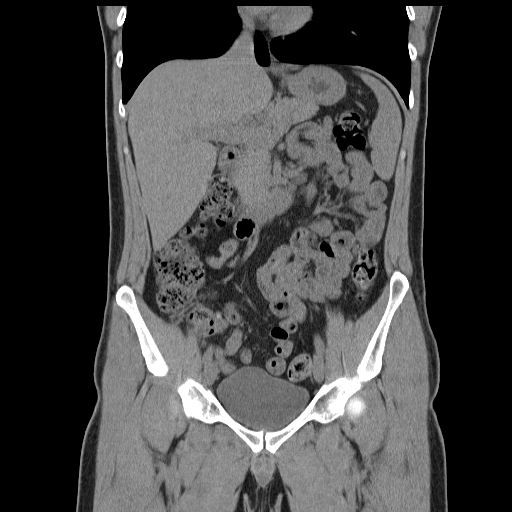
[im 47/85  soft-tissue]
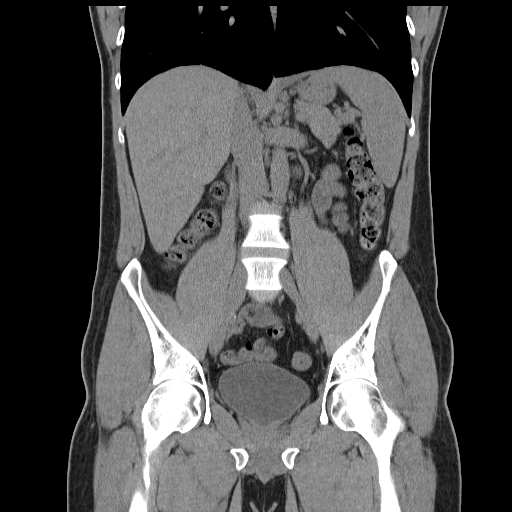

[17 of 46 positions shown; findings below may reference images not displayed]

FINDINGS: The lung bases are clear.  There is no evidence for free air.

3 mm stone in the right kidney mid pole without hydronephrosis. No
evidence for a right ureter stone. Normal appearance of the urinary
bladder. There is a subtle 3 mm stone in the left mid/lower pole
region. No evidence for left hydronephrosis or left ureter stones.

Normal appearance of the adrenal glands, liver, gallbladder, spleen
and pancreas. Normal appearance of the stomach and duodenum. There
is no significant free fluid or lymphadenopathy. No gross
abnormality to the prostate or seminal vesicles. Moderate amount of
stool in the colon. The appendix contains high density material but
no evidence for acute inflammation. Normal appearance of the small
bowel structures. No acute bone abnormality.
IMPRESSION: Bilateral nonobstructive kidney stones. No evidence for ureter
stones.

## 2016-04-30 ENCOUNTER — Encounter (HOSPITAL_COMMUNITY): Payer: Self-pay | Admitting: *Deleted

## 2016-04-30 ENCOUNTER — Emergency Department (HOSPITAL_COMMUNITY)
Admission: EM | Admit: 2016-04-30 | Discharge: 2016-04-30 | Disposition: A | Discharge status: Home / Self Care | Payer: Self-pay | Attending: Emergency Medicine | Admitting: Emergency Medicine

## 2016-04-30 DIAGNOSIS — Z791 Long term (current) use of non-steroidal anti-inflammatories (NSAID): Secondary | ICD-10-CM | POA: Insufficient documentation

## 2016-04-30 DIAGNOSIS — F17210 Nicotine dependence, cigarettes, uncomplicated: Secondary | ICD-10-CM | POA: Insufficient documentation

## 2016-04-30 DIAGNOSIS — R202 Paresthesia of skin: Secondary | ICD-10-CM | POA: Insufficient documentation

## 2016-04-30 DIAGNOSIS — Z792 Long term (current) use of antibiotics: Secondary | ICD-10-CM | POA: Insufficient documentation

## 2016-04-30 DIAGNOSIS — Z79899 Other long term (current) drug therapy: Secondary | ICD-10-CM | POA: Insufficient documentation

## 2016-04-30 MED ORDER — KETOROLAC TROMETHAMINE 30 MG/ML IJ SOLN
30.0000 mg | Freq: Once | INTRAMUSCULAR | Status: AC
  Administered 2016-04-30: 30 mg via INTRAMUSCULAR
  Filled 2016-04-30: qty 1

## 2016-04-30 MED ORDER — DEXAMETHASONE 4 MG PO TABS
12.0000 mg | ORAL_TABLET | Freq: Once | ORAL | Status: AC
  Administered 2016-04-30: 12 mg via ORAL
  Filled 2016-04-30: qty 3

## 2016-04-30 MED ORDER — MELOXICAM 15 MG PO TABS: 7.5000 mg | ORAL_TABLET | Freq: Every day | ORAL | 0 refills | Status: DC | PRN

## 2016-04-30 NOTE — ED Provider Notes (Signed)
[facility]EM[facility]PT Provider Note   CSN: [id number] Arrival date & time: 04/30/16  2152 By signing my name below, I, Levon HedgerElizabeth Hall, attest that this documentation has been prepared under the direction and in the presence of Raeford RazorStephen Charolett Yarrow, MD . Electronically Signed: Levon HedgerElizabeth Hall, Scribe. 04/30/2016. 10:37 PM.   History   Chief Complaint Chief Complaint  Patient presents with  . Leg Pain    HPI Kirt BoysMichael D Schecter is a 35 y.o. male who presents to the Emergency Department complaining of waxing and waning, worsening pain to left thigh that radiates behind his knee to his ankle onset three days ago. The next day, the pain began to radiate  to his lower back and right leg pain. He describes the pain is tingling. He has taken ibuprofen, tylenol, and Advil with no relief. He notes some relief after using a heating pad. Pt state the pain is exacerbated by walking or standing for long periods of time. He has never experienced this before. He denies difficulty urinating. Pt has no other complaints at this time.   The history is provided by the patient. No language interpreter was used.   Past Medical History:  Diagnosis Date  . Back pain   . Bipolar 1 disorder (HCC)   . Hepatitis C   . Kidney stones   . Schizo affective schizophrenia (HCC)   . Testicular cyst     There are no active problems to display for this patient.  History reviewed. No pertinent surgical history.   Home Medications    Prior to Admission medications   Medication Sig Start Date End Date Taking? Authorizing Provider  acetaminophen (TYLENOL) 500 MG tablet Take 1,000 mg by mouth every 6 (six) hours as needed.    Historical Provider, MD  acetaminophen-codeine (TYLENOL #3) 300-30 MG per tablet Take 1-2 tablets by mouth every 6 (six) hours as needed for moderate pain. Patient not taking: Reported on 02/16/2015 12/24/14   Ivery QualeHobson Bryant, PA-C  amoxicillin (AMOXIL) 500 MG capsule Take 1 capsule (500 mg total) by mouth 3  (three) times daily. Patient not taking: Reported on 02/16/2015 12/24/14   Ivery QualeHobson Bryant, PA-C  ibuprofen (ADVIL,MOTRIN) 800 MG tablet Take 1 tablet (800 mg total) by mouth 3 (three) times daily. 12/24/14   Ivery QualeHobson Bryant, PA-C  Multiple Vitamin (MULTIVITAMIN WITH MINERALS) TABS tablet Take 1 tablet by mouth daily.    Historical Provider, MD  naproxen (NAPROSYN) 500 MG tablet Take 1 tablet (500 mg total) by mouth 2 (two) times daily. Patient not taking: Reported on 02/16/2015 12/11/14   Glynn OctaveStephen Rancour, MD  ondansetron (ZOFRAN) 4 MG tablet Take 1 tablet (4 mg total) by mouth every 6 (six) hours. 12/11/14   Glynn OctaveStephen Rancour, MD    Family History Family History  Problem Relation Age of Onset  . Stroke Father   . Hypertension Father     Social History Social History  Substance Use Topics  . Smoking status: Current Every Day Smoker    Packs/day: 1.00    Years: 15.00    Types: Cigarettes  . Smokeless tobacco: Never Used  . Alcohol use No    Allergies   Latex  Review of Systems Review of Systems 10 systems reviewed and all are negative for acute change except as noted in the HPI.  Physical Exam Updated Vital Signs BP 132/87 (BP Location: Left Arm)   Pulse 87   Temp 98.1 F (36.7 C) (Temporal)   Resp 18   Ht 6\' 1"  (1.854 m)  Wt 170 lb (77.1 kg)   SpO2 100%   BMI 22.43 kg/m   Physical Exam  Constitutional: He is oriented to person, place, and time. He appears well-developed and well-nourished.  HENT:  Head: Normocephalic and atraumatic.  Eyes: EOM are normal.  Neck: Normal range of motion.  Cardiovascular: Normal rate, regular rhythm, normal heart sounds and intact distal pulses.   Pulmonary/Chest: Effort normal and breath sounds normal. No respiratory distress.  Abdominal: Soft. He exhibits no distension. There is no tenderness.  Musculoskeletal: Normal range of motion.  Back is normal to inspection; pain is not reproducible to palpation; negative straight leg test; 5/5  strength BLE; sensation intact to light touch; palpable DP pulses   Neurological: He is alert and oriented to person, place, and time.  Skin: Skin is warm and dry.  Psychiatric: He has a normal mood and affect. Judgment normal.  Nursing note and vitals reviewed.   ED Treatments / Results  DIAGNOSTIC STUDIES:  Oxygen Saturation is 100% on RA, normal by my interpretation.    COORDINATION OF CARE:  10:26 PM Discussed treatment plan with pt at bedside and pt agreed to plan.   Labs (all labs ordered are listed, but only abnormal results are displayed) Labs Reviewed - No data to display  EKG  EKG Interpretation None       Radiology No results found.  Procedures Procedures (including critical care time)  Medications Ordered in ED Medications - No data to display   Initial Impression / Assessment and Plan / ED Course  I have reviewed the triage vital signs and the nursing notes.  Pertinent labs & imaging results that were available during my care of the patient were reviewed by me and considered in my medical decision making (see chart for details).  Clinical Course    35 year old male with radicular lower back pain. On physical exam. Plan symptomatic treatment for likely musculoskeletal pain. Low suspicion for emergent process.  Final Clinical Impressions(s) / ED Diagnoses   Final diagnoses:  Paresthesias    New Prescriptions New Prescriptions   No medications on file    I personally preformed the services scribed in my presence. The recorded information has been reviewed is accurate. Raeford RazorStephen Nga Rabon, MD.    Raeford RazorStephen Aissa Lisowski, MD 05/08/16 [id number]

## 2016-04-30 NOTE — ED Triage Notes (Signed)
Pt c/o tingling in left thigh, that radiates behind his knee to his ankle x 2 days. Pt reports yesterday he started having the same feeling in his right leg and pain in his lower, mid back.

## 2016-04-30 NOTE — ED Notes (Signed)
Pt reports that he has low back pain and it is in his left and right legs with intermittent pain- 5/10 - He reports that he has had no relief with ibuprofen 800 mg   Also reports that he has no physician due to financial issues and that [place] that [place] had liver disease and not long to live  Pt ambulated heel to toe erect without guarding to rm 10- He denies incontinence of urine or stool

## 2016-10-29 ENCOUNTER — Emergency Department (HOSPITAL_COMMUNITY)
Admission: EM | Admit: 2016-10-29 | Discharge: 2016-10-29 | Disposition: A | Discharge status: Home / Self Care | Payer: Self-pay | Attending: Emergency Medicine | Admitting: Emergency Medicine

## 2016-10-29 ENCOUNTER — Encounter (HOSPITAL_COMMUNITY): Payer: Self-pay | Admitting: *Deleted

## 2016-10-29 DIAGNOSIS — K047 Periapical abscess without sinus: Secondary | ICD-10-CM | POA: Insufficient documentation

## 2016-10-29 DIAGNOSIS — F17210 Nicotine dependence, cigarettes, uncomplicated: Secondary | ICD-10-CM | POA: Insufficient documentation

## 2016-10-29 MED ORDER — HYDROCODONE-ACETAMINOPHEN 5-325 MG PO TABS: 1.0000 | ORAL_TABLET | Freq: Four times a day (QID) | ORAL | 0 refills | Status: DC | PRN

## 2016-10-29 MED ORDER — AMOXICILLIN 250 MG PO CAPS
500.0000 mg | ORAL_CAPSULE | Freq: Once | ORAL | Status: AC
  Administered 2016-10-29: 500 mg via ORAL
  Filled 2016-10-29: qty 2

## 2016-10-29 MED ORDER — AMOXICILLIN 500 MG PO CAPS: 500.0000 mg | ORAL_CAPSULE | Freq: Three times a day (TID) | ORAL | 0 refills | Status: AC

## 2016-10-29 NOTE — Discharge Instructions (Signed)
As discussed I suspect you have an early dental abscess.  Complete your entire course of antibiotics as prescribed.  You  may use the hydrocodone for pain relief but do not drive within 4 hours of taking as this will make you drowsy.  Avoid applying heat or ice to this area which can worsen your symptoms.  You may use warm salt water swish and spit treatment or half peroxide and water swish and spit after meals to keep this area clean as discussed.

## 2016-10-29 NOTE — ED Triage Notes (Signed)
Pt comes in with left lower dental pain starting 2 days ago. No breathing problems noted.

## 2016-10-30 MED FILL — Hydrocodone-Acetaminophen Tab 5-325 MG: ORAL | Qty: 6 | Status: AC

## 2016-10-30 NOTE — ED Provider Notes (Signed)
[facility] Provider Note   CSN: [id number] Arrival date & time: 10/29/16  1532     History   Chief Complaint Chief Complaint  Patient presents with  . Dental Pain    HPI Nicholas Nielsen is a 36 y.o. male presenting with a 2 day history of dental pain and gingival swelling.   The patient has a history of injury and/or decay of his lower teeth with multiple prior extractions, also with upper full dentures with chronic decay in his remaining teeth, but does not currently have dental insurance for continued dental care.  He has had no fevers, chills, nausea or vomiting, also no complaint of difficulty swallowing, although chewing makes pain worse.  The patient has tried ambesol, ibuprofen and tylenol without relief of symptoms.    .  The history is provided by the patient.    Past Medical History:  Diagnosis Date  . Back pain   . Bipolar 1 disorder (HCC)   . Hepatitis C   . Kidney stones   . Schizo affective schizophrenia (HCC)   . Testicular cyst     There are no active problems to display for this patient.   History reviewed. No pertinent surgical history.     Home Medications    Prior to Admission medications   Medication Sig Start Date End Date Taking? Authorizing Provider  acetaminophen (TYLENOL) 500 MG tablet Take 1,000 mg by mouth every 6 (six) hours as needed.    Historical Provider, MD  amoxicillin (AMOXIL) 500 MG capsule Take 1 capsule (500 mg total) by mouth 3 (three) times daily. 10/29/16 11/08/16  Burgess Amor, PA-C  HYDROcodone-acetaminophen (NORCO/VICODIN) 5-325 MG tablet Take 1 tablet by mouth every 6 (six) hours as needed. 10/29/16   Burgess Amor, PA-C  ibuprofen (ADVIL,MOTRIN) 800 MG tablet Take 1 tablet (800 mg total) by mouth 3 (three) times daily. 12/24/14   Ivery Quale, PA-C  meloxicam (MOBIC) 15 MG tablet Take 0.5 tablets (7.5 mg total) by mouth daily as needed for pain. 04/30/16   Raeford Razor, MD  Multiple Vitamin (MULTIVITAMIN WITH MINERALS)  TABS tablet Take 1 tablet by mouth daily.    Historical Provider, MD  naproxen sodium (ALEVE) 220 MG tablet Take 220-440 mg by mouth daily as needed (for pain).    Historical Provider, MD    Family History Family History  Problem Relation Age of Onset  . Stroke Father   . Hypertension Father     Social History Social History  Substance Use Topics  . Smoking status: Current Every Day Smoker    Packs/day: 1.00    Years: 15.00    Types: Cigarettes  . Smokeless tobacco: Never Used  . Alcohol use No     Allergies   Latex   Review of Systems Review of Systems  Constitutional: Negative for fever.  HENT: Positive for dental problem. Negative for facial swelling and sore throat.   Respiratory: Negative for shortness of breath.   Musculoskeletal: Negative for neck pain and neck stiffness.     Physical Exam Updated Vital Signs BP 126/73   Pulse (!) 109   Temp 97.6 F (36.4 C) (Oral)   Ht  (1.854 m)   Wt 83.9 kg   SpO2 100%   BMI 24.41 kg/m   Physical Exam  Constitutional: He is oriented to person, place, and time. He appears well-developed and well-nourished. No distress.  HENT:  Head: Normocephalic and atraumatic.  Right Ear: Tympanic membrane and external ear normal.  Left Ear: Tympanic membrane and external ear normal.  Mouth/Throat: Oropharynx is clear and moist and mucous membranes are normal. No oral lesions. No trismus in the jaw. Dental abscesses present.  Upper dentures in place.  Lower anterior and lateral incisors with significant decay, molars extracted.  Left lateral incisor with decay to the gingival line with mild gingival edema.  Erythema along gingival of all lower dentition.  Eyes: Conjunctivae are normal.  Neck: Normal range of motion. Neck supple.  Cardiovascular: Normal rate and normal heart sounds.   Pulmonary/Chest: Effort normal.  Musculoskeletal: Normal range of motion.  Lymphadenopathy:    He has no cervical adenopathy.  Neurological:  He is alert and oriented to person, place, and time.  Skin: Skin is warm and dry. No erythema.  Psychiatric: He has a normal mood and affect.     ED Treatments / Results  Labs (all labs ordered are listed, but only abnormal results are displayed) Labs Reviewed - No data to display  EKG  EKG Interpretation None       Radiology No results found.  Procedures Procedures (including critical care time)  Medications Ordered in ED Medications  amoxicillin (AMOXIL) capsule 500 mg (500 mg Oral Given 10/29/16 1649)     Initial Impression / Assessment and Plan / ED Course  I have reviewed the triage vital signs and the nursing notes.  Pertinent labs & imaging results that were available during my care of the patient were reviewed by me and considered in my medical decision making (see chart for details).     Probable early abscess.  Amoxil, given hydrocodone prepack for the first 24 hours, after which ibuprofen should be effective.  Dental referrals given.  Final Clinical Impressions(s) / ED Diagnoses   Final diagnoses:  Dental infection    New Prescriptions Discharge Medication List as of 10/29/2016  4:34 PM    START taking these medications   Details  amoxicillin (AMOXIL) 500 MG capsule Take 1 capsule (500 mg total) by mouth 3 (three) times daily., Starting Sun 10/29/2016, Until Wed 11/08/2016, Print    HYDROcodone-acetaminophen (NORCO/VICODIN) 5-325 MG tablet Take 1 tablet by mouth every 6 (six) hours as needed., Starting Sun 10/29/2016, Print         Burgess Amor, PA-C 10/30/16 6962    Raeford Razor, MD 11/01/16 1007

## 2018-01-17 ENCOUNTER — Emergency Department (HOSPITAL_COMMUNITY)
Admission: EM | Admit: 2018-01-17 | Discharge: 2018-01-17 | Disposition: A | Discharge status: Home / Self Care | Payer: No Typology Code available for payment source | Attending: Emergency Medicine | Admitting: Emergency Medicine

## 2018-01-17 ENCOUNTER — Encounter (HOSPITAL_COMMUNITY): Payer: Self-pay

## 2018-01-17 ENCOUNTER — Other Ambulatory Visit: Payer: Self-pay

## 2018-01-17 ENCOUNTER — Emergency Department (HOSPITAL_COMMUNITY): Payer: No Typology Code available for payment source

## 2018-01-17 DIAGNOSIS — S50311A Abrasion of right elbow, initial encounter: Secondary | ICD-10-CM | POA: Insufficient documentation

## 2018-01-17 DIAGNOSIS — S60511A Abrasion of right hand, initial encounter: Secondary | ICD-10-CM | POA: Diagnosis not present

## 2018-01-17 DIAGNOSIS — Y939 Activity, unspecified: Secondary | ICD-10-CM | POA: Insufficient documentation

## 2018-01-17 DIAGNOSIS — S299XXA Unspecified injury of thorax, initial encounter: Secondary | ICD-10-CM | POA: Diagnosis present

## 2018-01-17 DIAGNOSIS — S50312A Abrasion of left elbow, initial encounter: Secondary | ICD-10-CM | POA: Insufficient documentation

## 2018-01-17 DIAGNOSIS — Y999 Unspecified external cause status: Secondary | ICD-10-CM | POA: Diagnosis not present

## 2018-01-17 DIAGNOSIS — Y92410 Unspecified street and highway as the place of occurrence of the external cause: Secondary | ICD-10-CM | POA: Diagnosis not present

## 2018-01-17 DIAGNOSIS — F17210 Nicotine dependence, cigarettes, uncomplicated: Secondary | ICD-10-CM | POA: Insufficient documentation

## 2018-01-17 DIAGNOSIS — S80211A Abrasion, right knee, initial encounter: Secondary | ICD-10-CM | POA: Diagnosis not present

## 2018-01-17 DIAGNOSIS — S8002XA Contusion of left knee, initial encounter: Secondary | ICD-10-CM | POA: Diagnosis not present

## 2018-01-17 DIAGNOSIS — S20222A Contusion of left back wall of thorax, initial encounter: Secondary | ICD-10-CM | POA: Insufficient documentation

## 2018-01-17 DIAGNOSIS — S20219A Contusion of unspecified front wall of thorax, initial encounter: Secondary | ICD-10-CM

## 2018-01-17 DIAGNOSIS — T07XXXA Unspecified multiple injuries, initial encounter: Secondary | ICD-10-CM

## 2018-01-17 MED ORDER — OXYCODONE-ACETAMINOPHEN 5-325 MG PO TABS
1.0000 | ORAL_TABLET | Freq: Once | ORAL | Status: AC
Start: 2018-01-17 — End: 2018-01-17
  Administered 2018-01-17: 1 via ORAL
  Filled 2018-01-17: qty 1

## 2018-01-17 MED ORDER — METHOCARBAMOL 500 MG PO TABS: 500.0000 mg | ORAL_TABLET | Freq: Three times a day (TID) | ORAL | 0 refills | Status: DC | PRN
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
## 2018-01-17 NOTE — ED Notes (Signed)
Patient transported to X-ray 
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
## 2018-01-21 ENCOUNTER — Other Ambulatory Visit: Payer: Self-pay

## 2018-01-21 ENCOUNTER — Encounter (HOSPITAL_COMMUNITY): Payer: Self-pay | Admitting: Emergency Medicine

## 2018-01-21 ENCOUNTER — Emergency Department (HOSPITAL_COMMUNITY)
Admission: EM | Admit: 2018-01-21 | Discharge: 2018-01-21 | Disposition: A | Discharge status: Home / Self Care | Payer: No Typology Code available for payment source | Attending: Emergency Medicine | Admitting: Emergency Medicine

## 2018-01-21 DIAGNOSIS — S80212D Abrasion, left knee, subsequent encounter: Secondary | ICD-10-CM | POA: Insufficient documentation

## 2018-01-21 DIAGNOSIS — S40212D Abrasion of left shoulder, subsequent encounter: Secondary | ICD-10-CM | POA: Diagnosis not present

## 2018-01-21 DIAGNOSIS — F319 Bipolar disorder, unspecified: Secondary | ICD-10-CM | POA: Insufficient documentation

## 2018-01-21 DIAGNOSIS — S20412D Abrasion of left back wall of thorax, subsequent encounter: Secondary | ICD-10-CM | POA: Diagnosis not present

## 2018-01-21 DIAGNOSIS — F17210 Nicotine dependence, cigarettes, uncomplicated: Secondary | ICD-10-CM | POA: Diagnosis not present

## 2018-01-21 DIAGNOSIS — Z91040 Latex allergy status: Secondary | ICD-10-CM | POA: Diagnosis not present

## 2018-01-21 DIAGNOSIS — F209 Schizophrenia, unspecified: Secondary | ICD-10-CM | POA: Insufficient documentation

## 2018-01-21 DIAGNOSIS — T07XXXA Unspecified multiple injuries, initial encounter: Secondary | ICD-10-CM

## 2018-01-21 MED ORDER — SILVER SULFADIAZINE 1 % EX CREA: 1.0000 "application " | TOPICAL_CREAM | Freq: Two times a day (BID) | CUTANEOUS | 0 refills | Status: DC

## 2018-01-21 MED ORDER — SILVER SULFADIAZINE 1 % EX CREA
TOPICAL_CREAM | Freq: Once | CUTANEOUS | Status: AC
  Administered 2018-01-21: 18:00:00 via TOPICAL
  Filled 2018-01-21: qty 50

## 2018-01-21 MED ORDER — TRAMADOL HCL 50 MG PO TABS: 50.0000 mg | ORAL_TABLET | Freq: Four times a day (QID) | ORAL | 0 refills | Status: DC | PRN
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
## 2018-06-17 ENCOUNTER — Other Ambulatory Visit: Payer: Self-pay

## 2018-06-17 ENCOUNTER — Emergency Department (HOSPITAL_COMMUNITY)
Admission: EM | Admit: 2018-06-17 | Discharge: 2018-06-17 | Disposition: A | Discharge status: Home / Self Care | Payer: Self-pay | Attending: Emergency Medicine | Admitting: Emergency Medicine

## 2018-06-17 ENCOUNTER — Encounter (HOSPITAL_COMMUNITY): Payer: Self-pay | Admitting: Emergency Medicine

## 2018-06-17 DIAGNOSIS — R109 Unspecified abdominal pain: Secondary | ICD-10-CM

## 2018-06-17 DIAGNOSIS — Z79899 Other long term (current) drug therapy: Secondary | ICD-10-CM | POA: Insufficient documentation

## 2018-06-17 DIAGNOSIS — F17210 Nicotine dependence, cigarettes, uncomplicated: Secondary | ICD-10-CM | POA: Insufficient documentation

## 2018-06-17 DIAGNOSIS — R319 Hematuria, unspecified: Secondary | ICD-10-CM | POA: Insufficient documentation

## 2018-06-17 LAB — URINALYSIS, ROUTINE W REFLEX MICROSCOPIC
BILIRUBIN URINE: NEGATIVE
Bacteria, UA: NONE SEEN
Glucose, UA: NEGATIVE mg/dL
Ketones, ur: NEGATIVE mg/dL
Leukocytes, UA: NEGATIVE
Nitrite: NEGATIVE
Protein, ur: NEGATIVE mg/dL
Specific Gravity, Urine: 1.006 (ref 1.005–1.030)
pH: 7 (ref 5.0–8.0)

## 2018-06-17 LAB — COMPREHENSIVE METABOLIC PANEL
ALBUMIN: 3.7 g/dL (ref 3.5–5.0)
ALT: 35 U/L (ref 0–44)
AST: 39 U/L (ref 15–41)
Alkaline Phosphatase: 77 U/L (ref 38–126)
Anion gap: 7 (ref 5–15)
BUN: 16 mg/dL (ref 6–20)
CO2: 25 mmol/L (ref 22–32)
Calcium: 8.8 mg/dL — ABNORMAL LOW (ref 8.9–10.3)
Chloride: 108 mmol/L (ref 98–111)
Creatinine, Ser: 1.22 mg/dL (ref 0.61–1.24)
GFR calc Af Amer: >60 mL/min (ref >60)
GFR calc non Af Amer: >60 mL/min (ref >60)
Glucose, Bld: 114 mg/dL — ABNORMAL HIGH (ref 70–99)
Potassium: 3.4 mmol/L — ABNORMAL LOW (ref 3.5–5.1)
Sodium: 140 mmol/L (ref 135–145)
Total Bilirubin: 0.5 mg/dL (ref 0.3–1.2)
Total Protein: 6.7 g/dL (ref 6.5–8.1)

## 2018-06-17 LAB — CBC WITH DIFFERENTIAL/PLATELET
Abs Immature Granulocytes: 0.02 10*3/uL (ref 0.00–0.07)
Basophils Absolute: 0.1 10*3/uL (ref 0.0–0.1)
Basophils Relative: 1 %
Eosinophils Absolute: 0.1 10*3/uL (ref 0.0–0.5)
Eosinophils Relative: 1 %
HCT: 44 % (ref 39.0–52.0)
Hemoglobin: 14.5 g/dL (ref 13.0–17.0)
Immature Granulocytes: 0 %
Lymphocytes Relative: 30 %
Lymphs Abs: 2.6 10*3/uL (ref 0.7–4.0)
MCH: 31.9 pg (ref 26.0–34.0)
MCHC: 33 g/dL (ref 30.0–36.0)
MCV: 96.9 fL (ref 80.0–100.0)
Monocytes Absolute: 0.6 10*3/uL (ref 0.1–1.0)
Monocytes Relative: 7 %
NRBC: 0 % (ref 0.0–0.2)
Neutro Abs: 5.2 10*3/uL (ref 1.7–7.7)
Neutrophils Relative %: 61 %
Platelets: 160 10*3/uL (ref 150–400)
RBC: 4.54 MIL/uL (ref 4.22–5.81)
RDW: 12.8 % (ref 11.5–15.5)
WBC: 8.5 10*3/uL (ref 4.0–10.5)

## 2018-06-17 LAB — LIPASE, BLOOD: Lipase: 32 U/L (ref 11–51)

## 2018-06-17 MED ORDER — ONDANSETRON HCL 4 MG/2ML IJ SOLN
4.0000 mg | Freq: Once | INTRAMUSCULAR | Status: AC
  Administered 2018-06-17: 4 mg via INTRAVENOUS
  Filled 2018-06-17: qty 2

## 2018-06-17 MED ORDER — SODIUM CHLORIDE 0.9 % IV BOLUS (SEPSIS)
1000.0000 mL | Freq: Once | INTRAVENOUS | Status: AC
  Administered 2018-06-17: 1000 mL via INTRAVENOUS

## 2018-06-17 MED ORDER — NAPROXEN 500 MG PO TABS: 500.0000 mg | ORAL_TABLET | Freq: Two times a day (BID) | ORAL | 0 refills | Status: DC

## 2018-06-17 MED ORDER — KETOROLAC TROMETHAMINE 30 MG/ML IJ SOLN
30.0000 mg | Freq: Once | INTRAMUSCULAR | Status: AC
  Administered 2018-06-17: 30 mg via INTRAVENOUS
  Filled 2018-06-17: qty 1

## 2018-06-17 MED ORDER — SODIUM CHLORIDE 0.9 % IV SOLN: 1000.0000 mL | INTRAVENOUS | Status: DC

## 2018-06-17 MED ORDER — HYDROCODONE-ACETAMINOPHEN 5-325 MG PO TABS
1.0000 | ORAL_TABLET | Freq: Once | ORAL | Status: AC
  Administered 2018-06-17: 1 via ORAL
  Filled 2018-06-17: qty 1

## 2018-06-17 NOTE — ED Provider Notes (Signed)
[facility] Provider [facility]   CSN: [id number] Arrival date & time: 06/17/18  [id number]     History   Chief Complaint Chief Complaint  Patient presents with  . Flank Pain    HPI Nicholas Nielsen is a 37 y.o. male.  Pain is in the right flank.  Goes to the testicle.  Comes and goes.  Off and on for seferal months. The last few days more intense.    The history is provided by the patient.  Flank Pain  This is a new problem. The current episode started more than 1 week ago. The problem occurs every several days. The problem has been gradually worsening. Associated symptoms include abdominal pain. Pertinent negatives include no chest pain, no headaches and no shortness of breath. Nothing aggravates the symptoms. Nothing relieves the symptoms. He has tried nothing for the symptoms. The treatment provided no relief.    Past Medical History:  Diagnosis Date  . Back pain   . Bipolar 1 disorder (HCC)   . Hepatitis C   . Kidney stones   . Schizo affective schizophrenia (HCC)   . Testicular cyst     There are no active problems to display for this patient.   History reviewed. No pertinent surgical history.      Home Medications    Prior to Admission medications   Medication Sig Start Date End Date Taking? Authorizing Provider  methocarbamol (ROBAXIN) 500 MG tablet Take 1 tablet (500 mg total) by mouth every 8 (eight) hours as needed for muscle spasms. 01/17/18   Benjiman CorePickering, Nathan, MD  naproxen (NAPROSYN) 500 MG tablet Take 1 tablet (500 mg total) by mouth 2 (two) times daily with a meal. As needed for pain 06/17/18   Linwood DibblesKnapp, Audryna Wendt, MD  silver sulfADIAZINE (SILVADENE) 1 % cream Apply 1 application topically 2 (two) times daily. 01/21/18   Burgess AmorIdol, Julie, PA-C  traMADol (ULTRAM) 50 MG tablet Take 1 tablet (50 mg total) by mouth every 6 (six) hours as needed. 01/21/18   Burgess AmorIdol, Julie, PA-C  traZODone (DESYREL) 50 MG tablet Take 50 mg by mouth at bedtime as needed for sleep.      [provider]    Family History Family History  Problem Relation Age of Onset  . Stroke Father   . Hypertension Father     Social History Social History   Tobacco Use  . Smoking status: Current Every Day Smoker    Packs/day: 1.00    Years: 15.00    Pack years: 15.00    Types: Cigarettes  . Smokeless tobacco: Never Used  Substance Use Topics  . Alcohol use: Not Currently  . Drug use: No     Allergies   Latex   Review of Systems Review of Systems  Respiratory: Negative for shortness of breath.   Cardiovascular: Negative for chest pain.  Gastrointestinal: Positive for abdominal pain.  Genitourinary: Positive for flank pain.  Neurological: Negative for headaches.  All other systems reviewed and are negative.    Physical Exam Updated Vital Signs Pulse (!) 104   Temp 98 F (36.7 C) (Oral)   Resp 19   SpO2 94%   Physical Exam Vitals signs and nursing note reviewed.  Constitutional:      General: He is not in acute distress.    Appearance: He is well-developed.  HENT:     Head: Normocephalic and atraumatic.     Right Ear: External ear normal.     Left Ear: External ear  normal.  Eyes:     General: No scleral icterus.       Right eye: No discharge.        Left eye: No discharge.     Conjunctiva/sclera: Conjunctivae normal.  Neck:     Musculoskeletal: Neck supple.     Trachea: No tracheal deviation.  Cardiovascular:     Rate and Rhythm: Normal rate and regular rhythm.  Pulmonary:     Effort: Pulmonary effort is normal. No respiratory distress.     Breath sounds: Normal breath sounds. No stridor. No wheezing or rales.  Abdominal:     General: Bowel sounds are normal. There is no distension.     Palpations: Abdomen is soft.     Tenderness: There is no abdominal tenderness. There is no guarding or rebound.  Musculoskeletal:        General: No tenderness.  Skin:    General: Skin is warm and dry.     Findings: No rash.  Neurological:      Mental Status: He is alert.     Cranial Nerves: No cranial nerve deficit (no facial droop, extraocular movements intact, no slurred speech).     Sensory: No sensory deficit.     Motor: No abnormal muscle tone or seizure activity.     Coordination: Coordination normal.      ED Treatments / Results  Labs (all labs ordered are listed, but only abnormal results are displayed) Labs Reviewed  URINALYSIS, ROUTINE W REFLEX MICROSCOPIC - Abnormal; Notable for the following components:      Result Value   APPearance CLOUDY (*)    Hgb urine dipstick SMALL (*)    All other components within normal limits  COMPREHENSIVE METABOLIC PANEL - Abnormal; Notable for the following components:   Potassium 3.4 (*)    Glucose, Bld 114 (*)    Calcium 8.8 (*)    All other components within normal limits  CBC WITH DIFFERENTIAL/PLATELET  LIPASE, BLOOD     Procedures Procedures (including critical care time)  Medications Ordered in ED Medications  sodium chloride 0.9 % bolus 1,000 mL (1,000 mLs Intravenous New Bag/Given 06/17/18 2049)    Followed by  0.9 %  sodium chloride infusion (has no administration in time range)  ondansetron (ZOFRAN) injection 4 mg (4 mg Intravenous Given 06/17/18 2049)  ketorolac (TORADOL) 30 MG/ML injection 30 mg (30 mg Intravenous Given 06/17/18 2049)     Initial Impression / Assessment and Plan / ED Course  I have reviewed the triage vital signs and the nursing notes.  Pertinent labs & imaging results that were available during my care of the patient were reviewed by me and considered in my medical decision making (see chart for details).   Patient has been having intermittent right flank pain for several months.  The chronic duration of the symptoms are atypical for kidney stones however does sound like he is having intermittent episodes.  Patient does have hematuria.  I discussed doing a CT scan however the patient does not have insurance.  His abdomen is benign.  I  gave him the option of doing the CT scan today or treating for kidney stones empirically.  Patient opted to not do the CT scan today.  He understands he can return and and is encouraged to return if he has any worsening symptoms  Final Clinical Impressions(s) / ED Diagnoses   Final diagnoses:  Flank pain  Hematuria, unspecified type    ED Discharge Orders  Ordered    naproxen (NAPROSYN) 500 MG tablet  2 times daily with meals     06/17/18 2157           Linwood Dibbles, MD 06/17/18 2201

## 2018-06-17 NOTE — ED Triage Notes (Signed)
Pt c/o right flank pain that radiates to the right testicle intermittently for weeks. Pt states the pain is worse today.

## 2018-06-17 NOTE — Discharge Instructions (Addendum)
Take the medications as needed for pain, follow-up with your primary care doctor if you are able, return to the emergency room as needed for worsening symptoms

## 2018-06-18 ENCOUNTER — Emergency Department (HOSPITAL_COMMUNITY)
Admission: EM | Admit: 2018-06-18 | Discharge: 2018-06-18 | Disposition: A | Discharge status: Home / Self Care | Payer: Self-pay | Attending: Emergency Medicine | Admitting: Emergency Medicine

## 2018-06-18 ENCOUNTER — Other Ambulatory Visit: Payer: Self-pay

## 2018-06-18 ENCOUNTER — Emergency Department (HOSPITAL_COMMUNITY): Payer: Self-pay

## 2018-06-18 ENCOUNTER — Encounter (HOSPITAL_COMMUNITY): Payer: Self-pay

## 2018-06-18 DIAGNOSIS — F1310 Sedative, hypnotic or anxiolytic abuse, uncomplicated: Secondary | ICD-10-CM | POA: Insufficient documentation

## 2018-06-18 DIAGNOSIS — F1910 Other psychoactive substance abuse, uncomplicated: Secondary | ICD-10-CM

## 2018-06-18 DIAGNOSIS — F209 Schizophrenia, unspecified: Secondary | ICD-10-CM | POA: Insufficient documentation

## 2018-06-18 DIAGNOSIS — Z79899 Other long term (current) drug therapy: Secondary | ICD-10-CM | POA: Insufficient documentation

## 2018-06-18 DIAGNOSIS — F17210 Nicotine dependence, cigarettes, uncomplicated: Secondary | ICD-10-CM | POA: Insufficient documentation

## 2018-06-18 DIAGNOSIS — F319 Bipolar disorder, unspecified: Secondary | ICD-10-CM | POA: Insufficient documentation

## 2018-06-18 DIAGNOSIS — F1410 Cocaine abuse, uncomplicated: Secondary | ICD-10-CM | POA: Insufficient documentation

## 2018-06-18 DIAGNOSIS — R079 Chest pain, unspecified: Secondary | ICD-10-CM | POA: Insufficient documentation

## 2018-06-18 LAB — COMPREHENSIVE METABOLIC PANEL
ALT: 31 U/L (ref 0–44)
AST: 33 U/L (ref 15–41)
Albumin: 3.7 g/dL (ref 3.5–5.0)
Alkaline Phosphatase: 75 U/L (ref 38–126)
Anion gap: 6 (ref 5–15)
BUN: 14 mg/dL (ref 6–20)
CO2: 25 mmol/L (ref 22–32)
Calcium: 9 mg/dL (ref 8.9–10.3)
Chloride: 109 mmol/L (ref 98–111)
Creatinine, Ser: 0.92 mg/dL (ref 0.61–1.24)
GFR calc Af Amer: >60 mL/min (ref >60)
GFR calc non Af Amer: >60 mL/min (ref >60)
Glucose, Bld: 92 mg/dL (ref 70–99)
Potassium: 4.2 mmol/L (ref 3.5–5.1)
Sodium: 140 mmol/L (ref 135–145)
Total Bilirubin: 0.5 mg/dL (ref 0.3–1.2)
Total Protein: 6.7 g/dL (ref 6.5–8.1)

## 2018-06-18 LAB — RAPID URINE DRUG SCREEN, HOSP PERFORMED
Amphetamines: NOT DETECTED
BARBITURATES: NOT DETECTED
Benzodiazepines: POSITIVE — AB
Cocaine: POSITIVE — AB
Opiates: NOT DETECTED
Tetrahydrocannabinol: NOT DETECTED

## 2018-06-18 LAB — CBC
HCT: 43.4 % (ref 39.0–52.0)
HEMOGLOBIN: 13.8 g/dL (ref 13.0–17.0)
MCH: 31.2 pg (ref 26.0–34.0)
MCHC: 31.8 g/dL (ref 30.0–36.0)
MCV: 98 fL (ref 80.0–100.0)
Platelets: 150 10*3/uL (ref 150–400)
RBC: 4.43 MIL/uL (ref 4.22–5.81)
RDW: 12.9 % (ref 11.5–15.5)
WBC: 6 10*3/uL (ref 4.0–10.5)
nRBC: 0 % (ref 0.0–0.2)

## 2018-06-18 LAB — TROPONIN I: Troponin I: <0.03 ng/mL (ref <0.03)

## 2018-06-18 MED ORDER — NAPROXEN 250 MG PO TABS
375.0000 mg | ORAL_TABLET | Freq: Once | ORAL | Status: AC
  Administered 2018-06-18: 375 mg via ORAL
  Filled 2018-06-18: qty 2

## 2018-06-18 MED ORDER — HYDROXYZINE HCL 25 MG PO TABS: 25.0000 mg | ORAL_TABLET | Freq: Three times a day (TID) | ORAL | 0 refills | Status: DC | PRN

## 2018-06-18 MED ORDER — ACETAMINOPHEN 325 MG PO TABS
650.0000 mg | ORAL_TABLET | Freq: Once | ORAL | Status: AC
  Administered 2018-06-18: 650 mg via ORAL
  Filled 2018-06-18: qty 2

## 2018-06-18 NOTE — Progress Notes (Signed)
Called to advise pt's RN, Neldon LabellaEmmy, that patient is psych cleared and recommended for d/c.  Carney BernJean T. Kaylyn LimSutter, MSW, LCSWA Disposition Clinical Social Work [phone] (cell) [phone] (office)

## 2018-06-18 NOTE — Consult Note (Signed)
  Tele psych Assessment   Nicholas Nielsen, 37 y.o., male patient seen via tele psych by TTS and this provider; chart reviewed and consulted with Dr. Lucianne MussKumar on 06/18/18.  On evaluation Nicholas Nielsen reports he has a history of bipolar disorder and that he has not been sleeping well for the last 2 days; racing thoughts and wanting to get back on his medications.  Patient states he was told to come to the hospital and that he would get sent somewhere for treatment for seven days.  Patient denies suicidal/self-harm/homicidal ideation, psychosis, and paranoia.  Patient states that [facility] is [facility]    During evaluation Nicholas Nielsen is elevated up in bed; he is alert/oriented x 4; calm/cooperative; and mood congruent with affect.  Patient is speaking in a clear tone at moderate volume, and normal pace; with good eye contact.  His thought process is coherent and relevant; There is no indication that he is currently responding to internal/external stimuli or experiencing delusional thought content.  Patient denies suicidal/self-harm/homicidal ideation, psychosis, and paranoia.  Patient has remained calm throughout assessment and has answered questions appropriately.     For detailed note see TTS tele assessment note  Recommendations:  [facility]  RX Vistaril 25 mg Tid prn anxiety or sleep for a couple day until can follow up with [facility]  Disposition:  Patient is psychiatrically cleared No evidence of imminent risk to self or others at present.   Patient does not meet criteria for psychiatric inpatient admission. Supportive therapy provided about ongoing stressors. Discussed crisis plan, support from social network, calling 911, coming to the Emergency Department, and calling Suicide Hotline.  Spoke with Dr. Criss AlvineGoldston informed of above recommendation and disposition  Assunta FoundShuvon Rankin, NP

## 2018-06-18 NOTE — Discharge Instructions (Addendum)
It is very important to stay away from cocaine and other illicit drugs.  If you develop recurrent or new/worsening chest pain, shortness of breath, vomiting, or any other new/concerning symptoms and return to the ER for evaluation.

## 2018-06-18 NOTE — ED Notes (Signed)
EKG given to Dr. Goldston  

## 2018-06-18 NOTE — ED Provider Notes (Signed)
[facility] EMERGENCY DEPARTMENT Provider Note   CSN: [id number] Arrival date & time: 06/18/18  1102     History   Chief Complaint Chief Complaint  Patient presents with  . Medical Clearance  . Manic Behavior    HPI Nicholas Nielsen is a 37 y.o. male.  HPI  37 year old male presents with chest pain in addition to wanting inpatient management of his schizophrenia and bipolar disorder.  He states he has been stressed over the last several weeks due to custody battle.  He is also been off his meds for multiple years.  He denies feeling suicidal or homicidal.  He states that he just wants help for his schizophrenia.  He denies any hallucinations recently and denies alcohol use or other drug abuse besides marijuana occasionally for his anxiety.  He states he is had a lot of anxiety, depression, and poor sleep.  His chest pain has been on and off for months where he states he is sometimes daily will have chest pain that seems to radiate down his left arm.  None of that chest pain today but when he arrived from EMS he briefly noticed about 5 minutes of chest pain in the middle of his chest.  It is sharp but felt a little bit different than his earlier pain.  No current dyspnea but sometimes the other chest pain makes him have transient dyspnea.  He thinks a lot of his chest pain comes from anxiety.  No recent leg swelling, leg pain or travel.  No hemoptysis but he has had cough for about 3 weeks and smokes.  He states he called a psychiatric hotline who recommended to come to the hospital for inpatient treatment.  Past Medical History:  Diagnosis Date  . Back pain   . Bipolar 1 disorder (HCC)   . Hepatitis C   . Kidney stones   . Schizo affective schizophrenia (HCC)   . Testicular cyst     There are no active problems to display for this patient.   History reviewed. No pertinent surgical history.      Home Medications    Prior to Admission medications   Medication Sig Start Date  End Date Taking? Authorizing Provider  hydrOXYzine (ATARAX/VISTARIL) 25 MG tablet Take 1 tablet (25 mg total) by mouth every 8 (eight) hours as needed for anxiety. 06/18/18   Pricilla Loveless, MD  methocarbamol (ROBAXIN) 500 MG tablet Take 1 tablet (500 mg total) by mouth every 8 (eight) hours as needed for muscle spasms. 01/17/18   Benjiman Core, MD  naproxen (NAPROSYN) 500 MG tablet Take 1 tablet (500 mg total) by mouth 2 (two) times daily with a meal. As needed for pain 06/17/18   Linwood Dibbles, MD  silver sulfADIAZINE (SILVADENE) 1 % cream Apply 1 application topically 2 (two) times daily. 01/21/18   Burgess Amor, PA-C  traMADol (ULTRAM) 50 MG tablet Take 1 tablet (50 mg total) by mouth every 6 (six) hours as needed. 01/21/18   Burgess Amor, PA-C  traZODone (DESYREL) 50 MG tablet Take 50 mg by mouth at bedtime as needed for sleep.     [provider]    Family History Family History  Problem Relation Age of Onset  . Stroke Father   . Hypertension Father     Social History Social History   Tobacco Use  . Smoking status: Current Every Day Smoker    Packs/day: 1.00    Years: 15.00    Pack years: 15.00  Types: Cigarettes  . Smokeless tobacco: Never Used  Substance Use Topics  . Alcohol use: Not Currently  . Drug use: No     Allergies   Latex   Review of Systems Review of Systems  Respiratory: Positive for cough and shortness of breath.   Cardiovascular: Positive for chest pain. Negative for leg swelling.  Psychiatric/Behavioral: Positive for dysphoric mood and sleep disturbance. Negative for hallucinations and suicidal ideas. The patient is nervous/anxious.   All other systems reviewed and are negative.    Physical Exam Updated Vital Signs BP 118/89   Pulse 90   Resp 14   Ht 6\' 1"  (1.854 m)   Wt 86.2 kg   SpO2 98%   BMI 25.07 kg/m   Physical Exam Vitals signs and nursing note reviewed.  Constitutional:      Appearance: He is well-developed.  HENT:      Head: Normocephalic and atraumatic.     Right Ear: External ear normal.     Left Ear: External ear normal.     Nose: Nose normal.  Eyes:     General:        Right eye: No discharge.        Left eye: No discharge.  Neck:     Musculoskeletal: Neck supple.  Cardiovascular:     Rate and Rhythm: Normal rate and regular rhythm.     Heart sounds: Normal heart sounds.  Pulmonary:     Effort: Pulmonary effort is normal.     Breath sounds: Normal breath sounds.  Chest:     Chest wall: No tenderness.  Abdominal:     Palpations: Abdomen is soft.     Tenderness: There is no abdominal tenderness.  Skin:    General: Skin is warm and dry.  Neurological:     Mental Status: He is alert.  Psychiatric:        Attention and Perception: He does not perceive auditory or visual hallucinations.        Mood and Affect: Mood is not anxious.        Behavior: Behavior is not agitated.        Thought Content: Thought content is not paranoid. Thought content does not include homicidal or suicidal ideation.      ED Treatments / Results  Labs (all labs ordered are listed, but only abnormal results are displayed) Labs Reviewed  RAPID URINE DRUG SCREEN, HOSP PERFORMED - Abnormal; Notable for the following components:      Result Value   Cocaine POSITIVE (*)    Benzodiazepines POSITIVE (*)    All other components within normal limits  TROPONIN I  CBC  COMPREHENSIVE METABOLIC PANEL  TROPONIN I    EKG EKG Interpretation  Date/Time:  Tuesday June 18 2018 11:09:40 EST Ventricular Rate:  101 PR Interval:    QRS Duration: 100 QT Interval:  347 QTC Calculation: 450 R Axis:   83 Text Interpretation:  Sinus tachycardia no acute ST/T changes rate is faster, otherwise unchanged since 2006 Confirmed by Pricilla Loveless [phone]) on 06/18/2018 11:13:56 AM   Radiology Dg Chest 2 View  Result Date: 06/18/2018 CLINICAL DATA:  Chest pain. EXAM: CHEST - 2 VIEW COMPARISON:  01/17/2018. FINDINGS:  Mediastinum and hilar structures normal. Lungs are clear. No pleural effusion or pneumothorax. Heart size normal. No acute bony abnormality. IMPRESSION: No acute cardiopulmonary disease.  Chest is stable from prior exam. Electronically Signed   By: Maisie Fus  Register   On: 06/18/2018 11:43  Procedures Procedures (including critical care time)  Medications Ordered in ED Medications  naproxen (NAPROSYN) tablet 375 mg (375 mg Oral Given 06/18/18 1211)  acetaminophen (TYLENOL) tablet 650 mg (650 mg Oral Given 06/18/18 1427)     Initial Impression / Assessment and Plan / ED Course  I have reviewed the triage vital signs and the nursing notes.  Pertinent labs & imaging results that were available during my care of the patient were reviewed by me and considered in my medical decision making (see chart for details).     Patient's chest pain was vague and mild for only a couple minutes.  His UDS is positive for cocaine and a second troponin was obtained but my suspicion for ACS, PE, dissection is all very low.  I did discuss the importance of staying away from cocaine given this can cause heart problems.  As far as his psychiatric complaints, these are vague and subacute/chronic.  He is not SI or HI.  TTS [facility] and perhaps some Vistaril as needed for his anxiety which I will provide.  He otherwise has had some intermittent flank pain that he states is like prior kidney stones.  He had declined CT yesterday and we discussed indications for CT but given that his pain seems to be decently controlled at rest and he is not ill-appearing I do not think CT would change management currently.  Continue NSAIDs.  Discharged home with return precautions.  Final Clinical Impressions(s) / ED Diagnoses   Final diagnoses:  Nonspecific chest pain  Polysubstance abuse [facility](HCC)    ED Discharge Orders         Ordered    hydrOXYzine (ATARAX/VISTARIL) 25 MG tablet  Every 8 hours PRN      06/18/18 1523           Pricilla LovelessGoldston, Beena Catano, MD 06/18/18 1527

## 2018-06-18 NOTE — BH Assessment (Signed)
Tele Assessment Note   Patient Name: Nicholas Nielsen MRN: [id number] Referring Physician: Pricilla Loveless Location of Patient: [facility] Location of Provider: Behavioral Health TTS Department  Nicholas Nielsen is an 37 y.o. male who presented to [facility] seeking help for his bipolar disorder.  He states that he has been off his medications for the past five to six years and he is not sleeping, having racing thoughts and high anxiety,  Patient states that he has a custody case going on that has triggered him to be more anxious than usual. Patient denies SI/HI/Psychosis.  Patient states that 2-3 years ago he experienced psychosis, but has not had any since.  Patient states that he called the crisis line and was told to come to the ED.  Patient states that he feels like he needs to be admitted to the hospital to get back on medications.  He states that he was previously on xanax and klonopin and these medications helped, but he states that they made him too sleepy and he could not perform well at his job.  Patient is requesting to be on medications with less side effects.  Patient presented as alert and oriented.  He was mildly anxious and his mood depressed.  His thoughts were organized and his memory was intact.  His judgment, insight and impulse control were mildly impaired.  He did not appear to be responding to internal stimuli.  His eye contact was good and his speech was coherent.  Patient was neatly dressed, pleasant and cooperative and able to contract for safety to follow -up with OP Treatment  Diagnosis: Bipolar Manic F31.9  Past Medical History:  Past Medical History:  Diagnosis Date  . Back pain   . Bipolar 1 disorder (HCC)   . Hepatitis C   . Kidney stones   . Schizo affective schizophrenia (HCC)   . Testicular cyst     History reviewed. No pertinent surgical history.  Family History:  Family History  Problem Relation Age of Onset  . Stroke Father   . Hypertension Father      Social History:  reports that he has been smoking cigarettes. He has a 15.00 pack-year smoking history. He has never used smokeless tobacco. He reports previous alcohol use. He reports that he does not use drugs.  Additional Social History:  Alcohol / Drug Use Pain Medications: see MAR Prescriptions: see MAR Over the Counter: see MAR History of alcohol / drug use?: Yes Longest period of sobriety (when/how long): patient did not identify any period of abstinence Substance #1 Name of Substance 1: marijuana 1 - Age of First Use: 14 1 - Amount (size/oz): 1-2 bowls 1 - Frequency: every other day 1 - Duration: since onset 1 - Last Use / Amount: 14  CIWA: CIWA-Ar BP: 131/89 Pulse Rate: 87 COWS:    Allergies:  Allergies  Allergen Reactions  . Latex Rash    Home Medications: (Not in a hospital admission)   OB/GYN Status:  No LMP for male patient.  General Assessment Data Location of Assessment: [facility] TTS Assessment: In system Is this a Tele or Face-to-Face Assessment?: Tele Assessment Is this an Initial Assessment or a Re-assessment for this encounter?: Initial Assessment Patient Accompanied by:: N/A Language Other than English: No Living Arrangements: Other (Comment)(has own home) What gender do you identify as?: Male Marital status: Single Living Arrangements: Alone Can pt return to current living arrangement?: Yes Admission Status: Voluntary Is patient capable of signing voluntary admission?: Yes Referral  Source: Self/Family/Friend Insurance type: (self-pay)     Crisis Care Plan Living Arrangements: Alone Legal Guardian: Other:(self) Name of Psychiatrist: (none) Name of Therapist: none  Education Status Is patient currently in school?: No Is the patient employed, unemployed or receiving disability?: Employed  Risk to self with the past 6 months Suicidal Ideation: No Has patient been a risk to self within the past 6 months prior to admission? :  No Suicidal Intent: No Has patient had any suicidal intent within the past 6 months prior to admission? : No Is patient at risk for suicide?: No Suicidal Plan?: No Has patient had any suicidal plan within the past 6 months prior to admission? : No Access to Means: No What has been your use of drugs/alcohol within the last 12 months?: none Previous Attempts/Gestures: No How many times?: (none) Other Self Harm Risks: (none) Triggers for Past Attempts: None known Intentional Self Injurious Behavior: None Family Suicide History: No Recent stressful life event(s): Other (Comment)(child custody) Persecutory voices/beliefs?: No Depression: No Depression Symptoms: Insomnia, Isolating, Fatigue, Loss of interest in usual pleasures Substance abuse history and/or treatment for substance abuse?: Yes Suicide prevention information given to non-admitted patients: Not applicable  Risk to Others within the past 6 months Homicidal Ideation: No Does patient have any lifetime risk of violence toward others beyond the six months prior to admission? : No Thoughts of Harm to Others: No Current Homicidal Intent: No Current Homicidal Plan: No Access to Homicidal Means: No Identified Victim: (none) History of harm to others?: No Assessment of Violence: None Noted Violent Behavior Description: (none) Does patient have access to weapons?: No Criminal Charges Pending?: No Does patient have a court date: No Is patient on probation?: No  Psychosis Hallucinations: None noted(by hx, none in 2-3 years) Delusions: None noted  Mental Status Report Appearance/Hygiene: Unremarkable Eye Contact: Good Motor Activity: Restlessness Speech: Unremarkable Level of Consciousness: Restless Mood: Depressed Affect: Appropriate to circumstance Anxiety Level: Moderate Thought Processes: Coherent, Relevant Judgement: Impaired Orientation: Person, Place, Time, Situation Obsessive Compulsive Thoughts/Behaviors:  None  Cognitive Functioning Concentration: Normal Memory: Recent Intact, Remote Intact Is patient IDD: No Insight: Good Impulse Control: Good Appetite: Fair Have you had any weight changes? : Gain Amount of the weight change? (lbs): (unknown) Sleep: Decreased Total Hours of Sleep: (1-5)  ADLScreening [facility] Assessment Services) Patient's cognitive ability adequate to safely complete daily activities?: Yes Patient able to express need for assistance with ADLs?: Yes Independently performs ADLs?: Yes (appropriate for developmental age)  Prior Inpatient Therapy Prior Inpatient Therapy: No  Prior Outpatient Therapy Prior Outpatient Therapy: Yes Prior Therapy Dates: 5 yrs ago Prior Therapy Facilty/Provider(s): [facility] Reason for Treatment: (bipolar) Does patient have an ACCT team?: No Does patient have Intensive In-House Services?  : No Does patient have [facility] services? : No Does patient have P4CC services?: No  ADL Screening (condition at time of admission) Patient's cognitive ability adequate to safely complete daily activities?: Yes Is the patient deaf or have difficulty hearing?: No Does the patient have difficulty seeing, even when wearing glasses/contacts?: No Does the patient have difficulty concentrating, remembering, or making decisions?: No Patient able to express need for assistance with ADLs?: Yes Does the patient have difficulty dressing or bathing?: No Independently performs ADLs?: Yes (appropriate for developmental age) Does the patient have difficulty walking or climbing stairs?: No Weakness of Legs: None Weakness of Arms/Hands: None  Home Assistive Devices/Equipment Home Assistive Devices/Equipment: None  Therapy Consults (therapy consults require a physician order) PT  Evaluation Needed: No OT Evalulation Needed: No SLP Evaluation Needed: No Abuse/Neglect Assessment (Assessment to be complete while patient is alone) Abuse/Neglect  Assessment Can Be Completed: Yes Physical Abuse: Denies Verbal Abuse: Denies Sexual Abuse: Denies Exploitation of patient/patient's resources: Denies Self-Neglect: Denies Values / Beliefs Cultural Requests During Hospitalization: None Spiritual Requests During Hospitalization: None Consults Social Work Consult Needed: No Merchant navy officerAdvance Directives (For Healthcare) Does Patient Have a Medical Advance Directive?: No Would patient like information on creating a medical advance directive?: Yes (Inpatient - patient defers creating a medical advance directive at this time) Nutrition Screen- [facility] Adult/[facility] Has the patient recently lost weight without trying?: No Has the patient been eating poorly because of a decreased appetite?: No Malnutrition Screening Tool Score: 0        Disposition:   Per Shuvon Rankin, NP, patient does not meet inpatient criteria and [facility] Initial Assessment Completed for this Encounter: Yes Disposition of Patient: Discharge Patient refused recommended treatment: No Mode of transportation if patient is discharged/movement?: Car Patient referred to: Other (Comment)([facility])  This service was provided via telemedicine using a 2-way, interactive audio and video technology.  Names of all persons participating in this telemedicine service and their role in this encounter. Name: Nicholas Nielsen Role: patient  Name: Dannielle Huhanny Joselle Deeds Role: TTS  Name:  Role:   Name:  Role:     Daphene CalamityDanny J Emera Bussie 06/18/2018 1:57 PM

## 2018-06-18 NOTE — ED Triage Notes (Signed)
Pt reports has history of schizophrenia and bipolar.  Reports has been off his meds for the past 5 or 6 years.  Pt says has been going through a lot of stressful events lately.  Denies any SI or HI.  Denies any auditory or visual  Hallucinations.

## 2019-03-17 ENCOUNTER — Emergency Department (HOSPITAL_COMMUNITY)
Admission: EM | Admit: 2019-03-17 | Discharge: 2019-03-17 | Disposition: A | Discharge status: Home / Self Care | Payer: Self-pay | Attending: Emergency Medicine | Admitting: Emergency Medicine

## 2019-03-17 ENCOUNTER — Emergency Department (HOSPITAL_COMMUNITY): Payer: Self-pay

## 2019-03-17 ENCOUNTER — Encounter (HOSPITAL_COMMUNITY): Payer: Self-pay

## 2019-03-17 ENCOUNTER — Other Ambulatory Visit: Payer: Self-pay

## 2019-03-17 DIAGNOSIS — N200 Calculus of kidney: Secondary | ICD-10-CM

## 2019-03-17 DIAGNOSIS — N23 Unspecified renal colic: Secondary | ICD-10-CM | POA: Insufficient documentation

## 2019-03-17 DIAGNOSIS — N202 Calculus of kidney with calculus of ureter: Secondary | ICD-10-CM | POA: Insufficient documentation

## 2019-03-17 DIAGNOSIS — F17210 Nicotine dependence, cigarettes, uncomplicated: Secondary | ICD-10-CM | POA: Insufficient documentation

## 2019-03-17 LAB — COMPREHENSIVE METABOLIC PANEL
ALT: 47 U/L — ABNORMAL HIGH (ref 0–44)
AST: 29 U/L (ref 15–41)
Albumin: 4.1 g/dL (ref 3.5–5.0)
Alkaline Phosphatase: 72 U/L (ref 38–126)
Anion gap: 9 (ref 5–15)
BUN: 11 mg/dL (ref 6–20)
CO2: 22 mmol/L (ref 22–32)
Calcium: 9.1 mg/dL (ref 8.9–10.3)
Chloride: 107 mmol/L (ref 98–111)
Creatinine, Ser: 0.84 mg/dL (ref 0.61–1.24)
GFR calc Af Amer: >60 mL/min (ref >60)
GFR calc non Af Amer: >60 mL/min (ref >60)
Glucose, Bld: 111 mg/dL — ABNORMAL HIGH (ref 70–99)
Potassium: 3.7 mmol/L (ref 3.5–5.1)
Sodium: 138 mmol/L (ref 135–145)
Total Bilirubin: 0.7 mg/dL (ref 0.3–1.2)
Total Protein: 7.4 g/dL (ref 6.5–8.1)

## 2019-03-17 LAB — CBC WITH DIFFERENTIAL/PLATELET
Abs Immature Granulocytes: 0.08 10*3/uL — ABNORMAL HIGH (ref 0.00–0.07)
Basophils Absolute: 0.1 10*3/uL (ref 0.0–0.1)
Basophils Relative: 1 %
Eosinophils Absolute: 0.1 10*3/uL (ref 0.0–0.5)
Eosinophils Relative: 1 %
HCT: 46.5 % (ref 39.0–52.0)
Hemoglobin: 15.1 g/dL (ref 13.0–17.0)
Immature Granulocytes: 1 %
Lymphocytes Relative: 23 %
Lymphs Abs: 2.7 10*3/uL (ref 0.7–4.0)
MCH: 30.8 pg (ref 26.0–34.0)
MCHC: 32.5 g/dL (ref 30.0–36.0)
MCV: 94.9 fL (ref 80.0–100.0)
Monocytes Absolute: 0.9 10*3/uL (ref 0.1–1.0)
Monocytes Relative: 7 %
Neutro Abs: 8.1 10*3/uL — ABNORMAL HIGH (ref 1.7–7.7)
Neutrophils Relative %: 67 %
Platelets: 175 10*3/uL (ref 150–400)
RBC: 4.9 MIL/uL (ref 4.22–5.81)
RDW: 14.2 % (ref 11.5–15.5)
WBC: 11.8 10*3/uL — ABNORMAL HIGH (ref 4.0–10.5)
nRBC: 0 % (ref 0.0–0.2)

## 2019-03-17 LAB — URINALYSIS, ROUTINE W REFLEX MICROSCOPIC
Bacteria, UA: NONE SEEN
Bilirubin Urine: NEGATIVE
Glucose, UA: NEGATIVE mg/dL
Ketones, ur: NEGATIVE mg/dL
Leukocytes,Ua: NEGATIVE
Nitrite: NEGATIVE
Protein, ur: NEGATIVE mg/dL
Specific Gravity, Urine: 1.005 (ref 1.005–1.030)
pH: 6 (ref 5.0–8.0)

## 2019-03-17 LAB — LIPASE, BLOOD: Lipase: 50 U/L (ref 11–51)

## 2019-03-17 MED ORDER — SODIUM CHLORIDE 0.9 % IV BOLUS
1000.0000 mL | Freq: Once | INTRAVENOUS | Status: AC
  Administered 2019-03-17: 1000 mL via INTRAVENOUS

## 2019-03-17 MED ORDER — ONDANSETRON HCL 4 MG/2ML IJ SOLN
4.0000 mg | Freq: Once | INTRAMUSCULAR | Status: AC
  Administered 2019-03-17: 10:00:00 4 mg via INTRAVENOUS
  Filled 2019-03-17: qty 2

## 2019-03-17 MED ORDER — MORPHINE SULFATE (PF) 4 MG/ML IV SOLN
4.0000 mg | Freq: Once | INTRAVENOUS | Status: AC
  Administered 2019-03-17: 10:00:00 4 mg via INTRAVENOUS
  Filled 2019-03-17: qty 1

## 2019-03-17 MED ORDER — HYDROCODONE-ACETAMINOPHEN 5-325 MG PO TABS: 1.0000 | ORAL_TABLET | ORAL | 0 refills | Status: DC | PRN

## 2019-03-17 MED ORDER — ONDANSETRON 4 MG PO TBDP: 4.0000 mg | ORAL_TABLET | Freq: Three times a day (TID) | ORAL | 0 refills | Status: DC | PRN

## 2019-03-17 MED ORDER — KETOROLAC TROMETHAMINE 30 MG/ML IJ SOLN
30.0000 mg | Freq: Once | INTRAMUSCULAR | Status: AC
  Administered 2019-03-17: 11:00:00 30 mg via INTRAVENOUS
  Filled 2019-03-17: qty 1

## 2019-03-17 MED ORDER — HYDROMORPHONE HCL 1 MG/ML IJ SOLN
1.0000 mg | Freq: Once | INTRAMUSCULAR | Status: AC
  Administered 2019-03-17: 12:00:00 1 mg via INTRAVENOUS
  Filled 2019-03-17: qty 1

## 2019-03-17 NOTE — ED Notes (Signed)
Pt back from CT

## 2019-03-17 NOTE — ED Provider Notes (Signed)
[facility] EMERGENCY DEPARTMENT Provider Note   CSN: [id number] Arrival date & time: 03/17/19  0854   History   Chief Complaint Chief Complaint  Patient presents with  . Flank Pain   HPI Nicholas Nielsen is a 38 y.o. male with past medical history significant for chronic back pain, bipolar disorder, hepatitis C, schizoaffective disorder, testicular cyst, nephrolithiasis who presents for evaluation of right flank pain.  Patient states he has had intermittent right flank pain x2 days.  States the pain occurs radiates from his right flank down into his right groin.  He has been able to urinate without difficulty.  Denies fever, chills, shortness of breath, abdominal pain, diarrhea, dysuria, hematuria, penile discharge, testicular pain.  States he has had persistent nausea when his pain occurs.  One episode of emesis yesterday which was nonbloody, nonbilious.  Tolerating p.o. intake at home without difficulty.  States he took a Building control surveyorfriend's Percocet yesterday which "somewhat helped" with the pain however he only had 1 pill.  He is otherwise been taking ibuprofen.  He rates his current pain an 8/10.  Describes his pain as sharp and nagging.  Denies additional aggravating or alleviating factors.  Denies prior history of lithotripsy or stenting for recurrent stones.  He has not been followed by urology for stonespreviously.  History obtained from patient and past medical records.  No interpretor was used.     HPI  Past Medical History:  Diagnosis Date  . Back pain   . Bipolar 1 disorder (HCC)   . Hepatitis C   . Kidney stones   . Schizo affective schizophrenia (HCC)   . Testicular cyst     There are no active problems to display for this patient.   History reviewed. No pertinent surgical history.      Home Medications    Prior to Admission medications   Medication Sig Start Date End Date Taking? Authorizing Provider  HYDROcodone-acetaminophen (NORCO/VICODIN) 5-325 MG tablet Take 1  tablet by mouth every 4 (four) hours as needed. 03/17/19   Henderly, Britni A, PA-C  hydrOXYzine (ATARAX/VISTARIL) 25 MG tablet Take 1 tablet (25 mg total) by mouth every 8 (eight) hours as needed for anxiety. Patient not taking: Reported on 03/17/2019 06/18/18   Pricilla LovelessGoldston, Scott, MD  methocarbamol (ROBAXIN) 500 MG tablet Take 1 tablet (500 mg total) by mouth every 8 (eight) hours as needed for muscle spasms. Patient not taking: Reported on 03/17/2019 01/17/18   Benjiman CorePickering, Nathan, MD  naproxen (NAPROSYN) 500 MG tablet Take 1 tablet (500 mg total) by mouth 2 (two) times daily with a meal. As needed for pain Patient not taking: Reported on 03/17/2019 06/17/18   Linwood DibblesKnapp, Jon, MD  ondansetron (ZOFRAN ODT) 4 MG disintegrating tablet Take 1 tablet (4 mg total) by mouth every 8 (eight) hours as needed for nausea or vomiting. 03/17/19   Henderly, Britni A, PA-C  silver sulfADIAZINE (SILVADENE) 1 % cream Apply 1 application topically 2 (two) times daily. Patient not taking: Reported on 03/17/2019 01/21/18   Burgess AmorIdol, Julie, PA-C  traMADol (ULTRAM) 50 MG tablet Take 1 tablet (50 mg total) by mouth every 6 (six) hours as needed. Patient not taking: Reported on 03/17/2019 01/21/18   Burgess AmorIdol, Julie, PA-C    Family History Family History  Problem Relation Age of Onset  . Stroke Father   . Hypertension Father     Social History Social History   Tobacco Use  . Smoking status: Current Every Day Smoker    Packs/day: 1.00  Years: 15.00    Pack years: 15.00    Types: Cigarettes  . Smokeless tobacco: Never Used  Substance Use Topics  . Alcohol use: Not Currently  . Drug use: No     Allergies   Latex   Review of Systems Review of Systems  Constitutional: Negative.   HENT: Negative.   Respiratory: Negative.   Cardiovascular: Negative.   Gastrointestinal: Positive for nausea and vomiting. Negative for abdominal distention, abdominal pain, anal bleeding, blood in stool, constipation, diarrhea and rectal pain.   Genitourinary: Positive for flank pain. Negative for decreased urine volume, difficulty urinating, discharge, dysuria, frequency, genital sores, penile pain, penile swelling and urgency.  Musculoskeletal: Negative for gait problem.  Skin: Negative.   Neurological: Negative.   All other systems reviewed and are negative.    Physical Exam Updated Vital Signs BP (!) 133/116 (BP Location: Right Arm)   Pulse 63   Temp 98 F (36.7 C) (Oral)   Resp 12   Ht 6\' 1"  (1.854 m)   Wt 95.3 kg   SpO2 95%   BMI 27.71 kg/m   Physical Exam Vitals signs and nursing note reviewed. Exam conducted with a chaperone present.  Constitutional:      General: He is not in acute distress.    Appearance: He is well-developed. He is not ill-appearing, toxic-appearing or diaphoretic.  HENT:     Head: Normocephalic and atraumatic.     Nose: Nose normal.     Mouth/Throat:     Mouth: Mucous membranes are moist.  Eyes:     Pupils: Pupils are equal, round, and reactive to light.  Neck:     Musculoskeletal: Normal range of motion and neck supple.  Cardiovascular:     Rate and Rhythm: Normal rate and regular rhythm.     Pulses: Normal pulses.     Heart sounds: Normal heart sounds. No murmur. No friction rub. No gallop.   Pulmonary:     Effort: Pulmonary effort is normal. No respiratory distress.     Breath sounds: Normal breath sounds. No stridor. No wheezing, rhonchi or rales.  Abdominal:     General: Bowel sounds are normal. There is no distension.     Palpations: Abdomen is soft. There is no mass.     Tenderness: There is no abdominal tenderness. There is no right CVA tenderness, left CVA tenderness, guarding or rebound.     Hernia: No hernia is present.  Genitourinary:    Pubic Area: No rash.      Penis: Normal.      Scrotum/Testes: Cremasteric reflex is present.        Right: Mass, tenderness (Mild), swelling, testicular hydrocele or varicocele not present. Right testis is descended. Cremasteric  reflex is present.      Epididymis:     Right: Normal.     Left: Normal.     Comments: Mild  tenderness to palpation of the testes. No tenderness to  pididymis to suggest orchitis or epididymitis.    Musculoskeletal: Normal range of motion.        General: No swelling, tenderness, deformity or signs of injury.     Right lower leg: No edema.     Left lower leg: No edema.     Comments: 2+ DP, PT pulses bilaterally  Skin:    General: Skin is warm and dry.     Comments: No vesicular lesions, rashes.  Brisk capillary refill.  No fluctuance or induration, erythema or warmth.  Neurological:  General: No focal deficit present.     Mental Status: He is alert.     Motor: No weakness.     Comments: Cranial nerves II through XII grossly intact.  No facial droop.  Phonation normal.  Ambulatory without difficulty.    ED Treatments / Results  Labs (all labs ordered are listed, but only abnormal results are displayed) Labs Reviewed  URINALYSIS, ROUTINE W REFLEX MICROSCOPIC - Abnormal; Notable for the following components:      Result Value   Hgb urine dipstick SMALL (*)    All other components within normal limits  CBC WITH DIFFERENTIAL/PLATELET - Abnormal; Notable for the following components:   WBC 11.8 (*)    Neutro Abs 8.1 (*)    Abs Immature Granulocytes 0.08 (*)    All other components within normal limits  COMPREHENSIVE METABOLIC PANEL - Abnormal; Notable for the following components:   Glucose, Bld 111 (*)    ALT 47 (*)    All other components within normal limits  URINE CULTURE  LIPASE, BLOOD    EKG None  Radiology Ct Renal Stone Study  Result Date: 03/17/2019 CLINICAL DATA:  Right flank pain with nausea and vomiting EXAM: CT ABDOMEN AND PELVIS WITHOUT CONTRAST TECHNIQUE: Multidetector CT imaging of the abdomen and pelvis was performed following the standard protocol without oral or IV contrast. COMPARISON:  February 16, 2015 FINDINGS: Lower chest: There is slight  atelectasis in the posterior right base. There is no lung base edema or consolidation. Hepatobiliary: No focal liver lesions are evident on this noncontrast enhanced study. Gallbladder wall is not appreciably thickened. There is no biliary duct dilatation. Pancreas: There is no pancreatic mass or inflammatory focus. Spleen: No splenic lesions are evident. Adrenals/Urinary Tract: Adrenals bilaterally appear unremarkable. There is no evident renal mass or hydronephrosis on either side. There is an extrarenal calculus on each side, an anatomic variant. There is a 3 mm calculus in the lower pole the right kidney. There is a 1 mm calculus in the mid right kidney with a nearby 2 mm calculus in the mid right kidney. There is no appreciable renal calculus on the left. There is a tiny calculus at the right ureterovesical junction. No other ureteral calculi are evident. Urinary bladder is midline with wall thickness within normal limits. Stomach/Bowel: There is no appreciable bowel wall or mesenteric thickening. There is moderate stool in the colon. There is no appreciable bowel obstruction. Terminal ileum appears unremarkable. There is no appreciable free air or portal venous air. Vascular/Lymphatic: There is no abdominal aortic aneurysm. There is aortic and iliac artery atherosclerosis. There is no evident adenopathy in the abdomen or pelvis. Reproductive: Prostate and seminal vesicles are normal in size and contour. No evident pelvic mass. Other: Appendix appears normal. There is no abscess or ascites in the abdomen or pelvis. There is mild fat in the umbilicus region. Musculoskeletal: There are no blastic or lytic bone lesions. There is no intramuscular lesion. IMPRESSION: 1. Tiny calculus at the right ureterovesical junction without appreciable hydronephrosis. There are 1-3 mm nonobstructing calculi in the right kidney. 2. No bowel obstruction. No abscess in the abdomen or pelvis. Appendix appears normal. 3.  Aortic  Atherosclerosis (ICD10-I70.0). Electronically Signed   By: Bretta BangWilliam  Woodruff III M.D.   On: 03/17/2019 09:58    Procedures Procedures (including critical care time)  Medications Ordered in ED Medications  sodium chloride 0.9 % bolus 1,000 mL (0 mLs Intravenous Stopped 03/17/19 1057)  morphine 4 MG/ML injection 4  mg (4 mg Intravenous Given 03/17/19 0938)  ondansetron (ZOFRAN) injection 4 mg (4 mg Intravenous Given 03/17/19 0938)  ketorolac (TORADOL) 30 MG/ML injection 30 mg (30 mg Intravenous Given 03/17/19 1035)  HYDROmorphone (DILAUDID) injection 1 mg (1 mg Intravenous Given 03/17/19 1220)    Initial Impression / Assessment and Plan / ED Course  I have reviewed the triage vital signs and the nursing notes.  Pertinent labs & imaging results that were available during my care of the patient were reviewed by me and considered in my medical decision making (see chart for details).  38 year old male appears otherwise well presents for evaluation of flank pain.  Known history of stones.  Tolerating p.o. intake at home.  No overlying skin changes.  Negative CVA tap.  Abdomen soft, nontender without rebound or guarding.  Pain does radiate to groin.  GU exam without tenderness, rash, swelling.  Low suspicion for torsion, STD.  Will obtain CT scan stone and reevaluate.  Provide IV fluids and analgesia.  1040: Patient with mild relief of pain with morphine.  Continues to have right-sided testicular pain.  He does have a  tiny calculus at the right ureterovesical junction without appreciable hydronephrosis.  Given continued pain to right scrotum with known mass will obtain ultrasound to rule out torsion, infectious process. No concern for STD.  Patient is afebrile without abdominal tenderness, abdominal pain or painful bowel movements to indicate prostatitis.  Mild  tenderness to palpation of the testes. No tenderness to  pididymis to suggest orchitis or epididymitis.    Labs and imaging personally  reviewed.  Labs at baseline.  No elevated creatinine.  Urinalysis with mild hematuria without infectious process  1240: [place] with left testicular cyst. No torsion. Pain controlled in ED without difficulty. Tolerating PO intake without emesis.  Patient is nontoxic, nonseptic appearing, in no apparent distress.  Patient's pain and other symptoms adequately managed in emergency department.  Fluid bolus given.  Labs, imaging and vitals reviewed.  Patient does not meet the SIRS or Sepsis criteria.  On repeat exam patient does not have a surgical abdomin and there are no peritoneal signs.  No indication of appendicitis, bowel obstruction, bowel perforation, cholecystitis, diverticulitis.  Patient discharged home with symptomatic treatment and given strict instructions for follow-up with their primary care physician.  I have also discussed reasons to return immediately to the ER.  Patient expresses understanding and agrees with plan.     The patient has been appropriately medically screened and/or stabilized in the ED. I have low suspicion for any other emergent medical condition which would require further screening, evaluation or treatment in the ED or require inpatient management.  Patient is hemodynamically stable and in no acute distress.  Patient able to ambulate in department prior to ED.  Evaluation does not show acute pathology that would require ongoing or additional emergent interventions while in the emergency department or further inpatient treatment.  I have discussed the diagnosis with the patient and answered all questions.  Pain is been managed while in the emergency department and patient has no further complaints prior to discharge.  Patient is comfortable with plan discussed in room and is stable for discharge at this time.  I have discussed strict return precautions for returning to the emergency department.  Patient was encouraged to follow-up with PCP/specialist refer to at discharge. Final  Clinical Impressions(s) / ED Diagnoses   Final diagnoses:  Nephrolithiasis  Renal colic    ED Discharge Orders  Ordered    ondansetron (ZOFRAN ODT) 4 MG disintegrating tablet  Every 8 hours PRN     03/17/19 1047    HYDROcodone-acetaminophen (NORCO/VICODIN) 5-325 MG tablet  Every 4 hours PRN     03/17/19 1047           Henderly, Britni A, PA-C 03/17/19 1244    Milton Ferguson, MD 03/20/19 1159

## 2019-03-17 NOTE — ED Triage Notes (Signed)
Pt complaining of right flank pain that extends to his groin. 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 Nausea last night and today. Threw up last night with slightly blood tinged sputum.

## 2019-03-17 NOTE — Discharge Instructions (Addendum)
Take the pain medications as prescribed.  Do not drive or operate heavy machinery while taking these medications.  These medications may become addictive.  Make sure to drink plenty of fluids.  Have also given you medication for nausea.  Please take as prescribed.  Follow-up with urology if you continue to have symptoms return to the emergency department for any new worsening symptoms.

## 2019-03-18 LAB — URINE CULTURE: Culture: NO GROWTH

## 2019-03-26 ENCOUNTER — Emergency Department (HOSPITAL_COMMUNITY)
Admission: EM | Admit: 2019-03-26 | Discharge: 2019-03-26 | Disposition: A | Discharge status: Home / Self Care | Payer: Self-pay | Attending: Emergency Medicine | Admitting: Emergency Medicine

## 2019-03-26 ENCOUNTER — Encounter (HOSPITAL_COMMUNITY): Payer: Self-pay | Admitting: Emergency Medicine

## 2019-03-26 ENCOUNTER — Other Ambulatory Visit: Payer: Self-pay

## 2019-03-26 DIAGNOSIS — Z91040 Latex allergy status: Secondary | ICD-10-CM | POA: Insufficient documentation

## 2019-03-26 DIAGNOSIS — F17210 Nicotine dependence, cigarettes, uncomplicated: Secondary | ICD-10-CM | POA: Insufficient documentation

## 2019-03-26 DIAGNOSIS — F419 Anxiety disorder, unspecified: Secondary | ICD-10-CM | POA: Insufficient documentation

## 2019-03-26 DIAGNOSIS — F259 Schizoaffective disorder, unspecified: Secondary | ICD-10-CM | POA: Insufficient documentation

## 2019-03-26 DIAGNOSIS — F3110 Bipolar disorder, current episode manic without psychotic features, unspecified: Secondary | ICD-10-CM | POA: Insufficient documentation

## 2019-03-26 LAB — CBC WITH DIFFERENTIAL/PLATELET
Abs Immature Granulocytes: 0.22 10*3/uL — ABNORMAL HIGH (ref 0.00–0.07)
Basophils Absolute: 0.1 10*3/uL (ref 0.0–0.1)
Basophils Relative: 1 %
Eosinophils Absolute: 0 10*3/uL (ref 0.0–0.5)
Eosinophils Relative: 0 %
HCT: 52.8 % — ABNORMAL HIGH (ref 39.0–52.0)
Hemoglobin: 17 g/dL (ref 13.0–17.0)
Immature Granulocytes: 1 %
Lymphocytes Relative: 17 %
Lymphs Abs: 2.7 10*3/uL (ref 0.7–4.0)
MCH: 30.5 pg (ref 26.0–34.0)
MCHC: 32.2 g/dL (ref 30.0–36.0)
MCV: 94.8 fL (ref 80.0–100.0)
Monocytes Absolute: 0.8 10*3/uL (ref 0.1–1.0)
Monocytes Relative: 5 %
Neutro Abs: 12 10*3/uL — ABNORMAL HIGH (ref 1.7–7.7)
Neutrophils Relative %: 76 %
Platelets: 222 10*3/uL (ref 150–400)
RBC: 5.57 MIL/uL (ref 4.22–5.81)
RDW: 13.8 % (ref 11.5–15.5)
WBC: 15.7 10*3/uL — ABNORMAL HIGH (ref 4.0–10.5)
nRBC: 0 % (ref 0.0–0.2)

## 2019-03-26 LAB — BASIC METABOLIC PANEL
Anion gap: 8 (ref 5–15)
BUN: 9 mg/dL (ref 6–20)
CO2: 27 mmol/L (ref 22–32)
Calcium: 9.5 mg/dL (ref 8.9–10.3)
Chloride: 102 mmol/L (ref 98–111)
Creatinine, Ser: 0.93 mg/dL (ref 0.61–1.24)
GFR calc Af Amer: >60 mL/min (ref >60)
GFR calc non Af Amer: >60 mL/min (ref >60)
Glucose, Bld: 107 mg/dL — ABNORMAL HIGH (ref 70–99)
Potassium: 3.9 mmol/L (ref 3.5–5.1)
Sodium: 137 mmol/L (ref 135–145)

## 2019-03-26 LAB — RAPID URINE DRUG SCREEN, HOSP PERFORMED
Amphetamines: NOT DETECTED
Barbiturates: NOT DETECTED
Benzodiazepines: POSITIVE — AB
Cocaine: NOT DETECTED
Opiates: NOT DETECTED
Tetrahydrocannabinol: POSITIVE — AB

## 2019-03-26 LAB — ETHANOL: Alcohol, Ethyl (B): <10 mg/dL (ref <10)

## 2019-03-26 MED ORDER — LORAZEPAM 1 MG PO TABS
1.0000 mg | ORAL_TABLET | Freq: Once | ORAL | Status: AC
  Administered 2019-03-26: 1 mg via ORAL
  Filled 2019-03-26: qty 1

## 2019-03-26 MED ORDER — HYDROXYZINE HCL 25 MG PO TABS: 25.0000 mg | ORAL_TABLET | Freq: Four times a day (QID) | ORAL | 2 refills | Status: DC | PRN

## 2019-03-26 MED ORDER — HYDROXYZINE HCL 25 MG PO TABS
25.0000 mg | ORAL_TABLET | Freq: Once | ORAL | Status: AC
  Administered 2019-03-26: 25 mg via ORAL
  Filled 2019-03-26: qty 1

## 2019-03-26 MED ORDER — SERTRALINE HCL 50 MG PO TABS: 50.0000 mg | ORAL_TABLET | Freq: Every day | ORAL | 1 refills | Status: DC

## 2019-03-26 MED ORDER — NICOTINE 21 MG/24HR TD PT24
21.0000 mg | MEDICATED_PATCH | Freq: Once | TRANSDERMAL | Status: DC
  Administered 2019-03-26: 21 mg via TRANSDERMAL
  Filled 2019-03-26: qty 1

## 2019-03-26 NOTE — ED Triage Notes (Signed)
Pt c/o hearing voices and seeing dead people.  Denies HI/SI at this time.  EMS reports pt has not being taking medications in last 6 months.

## 2019-03-26 NOTE — ED Notes (Signed)
Patient began to yell at staff stating that he wants to go home because he has obligations at home. Pt demanded his clothes and to leave if we were not going to medicate him. Pt requested "nerve medicine". Wilson Singer, MD notified and spoke with pt. Pt decided to stay and receive treatment if he would be provided medications. Stated he would call someone to pick up his daughter at 5 pm after Wilson Singer, MD educated him on length of treatment. Pt is aware that process of treatment could not be determined at this time. Medications given to pt for anxiety. Nicotine patch applied. TTS speaking with patient at this time.

## 2019-03-26 NOTE — ED Notes (Signed)
Pt was offered meal tray, wanted just the banana and drink.
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
## 2019-03-26 NOTE — ED Notes (Signed)
2 bags with pt labels given to patient upon D/C
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
## 2019-03-26 NOTE — Discharge Instructions (Addendum)
Please keep trying to get set up with outpt treatment. See attached resources. Good luck to you and your daughter. Come back to the emergency room any time that you feel you need to.

## 2020-12-07 ENCOUNTER — Encounter (HOSPITAL_COMMUNITY): Payer: Self-pay

## 2020-12-07 ENCOUNTER — Other Ambulatory Visit: Payer: Self-pay

## 2020-12-07 ENCOUNTER — Emergency Department (HOSPITAL_COMMUNITY)
Admission: EM | Admit: 2020-12-07 | Discharge: 2020-12-08 | Disposition: A | Discharge status: Home / Self Care | Payer: Self-pay | Attending: Emergency Medicine | Admitting: Emergency Medicine

## 2020-12-07 ENCOUNTER — Emergency Department (HOSPITAL_COMMUNITY): Payer: Self-pay

## 2020-12-07 DIAGNOSIS — Z87442 Personal history of urinary calculi: Secondary | ICD-10-CM | POA: Insufficient documentation

## 2020-12-07 DIAGNOSIS — F17210 Nicotine dependence, cigarettes, uncomplicated: Secondary | ICD-10-CM | POA: Insufficient documentation

## 2020-12-07 DIAGNOSIS — Z91040 Latex allergy status: Secondary | ICD-10-CM | POA: Insufficient documentation

## 2020-12-07 DIAGNOSIS — N200 Calculus of kidney: Secondary | ICD-10-CM | POA: Insufficient documentation

## 2020-12-07 DIAGNOSIS — E860 Dehydration: Secondary | ICD-10-CM | POA: Insufficient documentation

## 2020-12-07 LAB — CBC WITH DIFFERENTIAL/PLATELET
Abs Immature Granulocytes: 0.08 10*3/uL — ABNORMAL HIGH (ref 0.00–0.07)
Basophils Absolute: 0 10*3/uL (ref 0.0–0.1)
Basophils Relative: 0 %
Eosinophils Absolute: 0 10*3/uL (ref 0.0–0.5)
Eosinophils Relative: 0 %
HCT: 44.9 % (ref 39.0–52.0)
Hemoglobin: 15 g/dL (ref 13.0–17.0)
Immature Granulocytes: 1 %
Lymphocytes Relative: 12 %
Lymphs Abs: 1.6 10*3/uL (ref 0.7–4.0)
MCH: 32.1 pg (ref 26.0–34.0)
MCHC: 33.4 g/dL (ref 30.0–36.0)
MCV: 96.1 fL (ref 80.0–100.0)
Monocytes Absolute: 0.8 10*3/uL (ref 0.1–1.0)
Monocytes Relative: 6 %
Neutro Abs: 10.7 10*3/uL — ABNORMAL HIGH (ref 1.7–7.7)
Neutrophils Relative %: 81 %
Platelets: 183 10*3/uL (ref 150–400)
RBC: 4.67 MIL/uL (ref 4.22–5.81)
RDW: 13.5 % (ref 11.5–15.5)
WBC: 13.2 10*3/uL — ABNORMAL HIGH (ref 4.0–10.5)
nRBC: 0 % (ref 0.0–0.2)

## 2020-12-07 LAB — COMPREHENSIVE METABOLIC PANEL
ALT: 29 U/L (ref 0–44)
AST: 38 U/L (ref 15–41)
Albumin: 4.6 g/dL (ref 3.5–5.0)
Alkaline Phosphatase: 87 U/L (ref 38–126)
Anion gap: 8 (ref 5–15)
BUN: 16 mg/dL (ref 6–20)
CO2: 26 mmol/L (ref 22–32)
Calcium: 9.4 mg/dL (ref 8.9–10.3)
Chloride: 104 mmol/L (ref 98–111)
Creatinine, Ser: 1.05 mg/dL (ref 0.61–1.24)
GFR, Estimated: >60 mL/min (ref >60)
Glucose, Bld: 129 mg/dL — ABNORMAL HIGH (ref 70–99)
Potassium: 3.4 mmol/L — ABNORMAL LOW (ref 3.5–5.1)
Sodium: 138 mmol/L (ref 135–145)
Total Bilirubin: 0.8 mg/dL (ref 0.3–1.2)
Total Protein: 7.7 g/dL (ref 6.5–8.1)

## 2020-12-07 LAB — CK: Total CK: 1050 U/L — ABNORMAL HIGH (ref 49–397)

## 2020-12-07 LAB — LIPASE, BLOOD: Lipase: 29 U/L (ref 11–51)

## 2020-12-07 MED ORDER — ONDANSETRON HCL 4 MG PO TABS: 4.0000 mg | ORAL_TABLET | Freq: Three times a day (TID) | ORAL | 0 refills | Status: DC | PRN

## 2020-12-07 MED ORDER — MORPHINE SULFATE (PF) 4 MG/ML IV SOLN
4.0000 mg | Freq: Once | INTRAVENOUS | Status: AC
  Administered 2020-12-07: 4 mg via INTRAVENOUS
  Filled 2020-12-07: qty 1

## 2020-12-07 MED ORDER — SODIUM CHLORIDE 0.9 % IV BOLUS
1000.0000 mL | Freq: Once | INTRAVENOUS | Status: AC
  Administered 2020-12-07: 1000 mL via INTRAVENOUS

## 2020-12-07 MED ORDER — SODIUM CHLORIDE 0.9 % IV SOLN: INTRAVENOUS | Status: DC

## 2020-12-07 MED ORDER — HYDROCODONE-ACETAMINOPHEN 5-325 MG PO TABS: 1.0000 | ORAL_TABLET | Freq: Four times a day (QID) | ORAL | 0 refills | Status: DC | PRN

## 2020-12-07 MED ORDER — ONDANSETRON HCL 4 MG/2ML IJ SOLN
4.0000 mg | Freq: Once | INTRAMUSCULAR | Status: AC
  Administered 2020-12-07: 4 mg via INTRAVENOUS
  Filled 2020-12-07: qty 2

## 2020-12-07 NOTE — ED Triage Notes (Signed)
Pt. States they went home tonight and ate dinner. After dinner pt. Felt pain on their right flank side.

## 2020-12-07 NOTE — ED Notes (Signed)
When pt lays on side his pain is a 1/10 but when he moves it goes up to  A 6/10

## 2020-12-07 NOTE — Discharge Instructions (Signed)
As discussed, you have been diagnosed with a kidney stone.  It is 4 mm, and is likely to pass on its own.  You have also been diagnosed dehydration.  Is very important you stay well-hydrated, and monitor your condition carefully.  Do not hesitate to return here for concerning changes in your condition.

## 2020-12-07 NOTE — ED Provider Notes (Signed)
[facility] EMERGENCY DEPARTMENT Provider Note   CSN: [id number] Arrival date & time: 12/07/20  1913     History Chief Complaint  Patient presents with  . Flank Pain    Nicholas Nielsen is a 40 y.o. male.  HPI Patient presents with 4 hours of pain.  He has a history of kidney stones.  He was well prior to this.  Since onset pain has been persistent, focally in the right flank, right lower quadrant.  There is associated nausea, vomiting.  No scrotal pain, scrotal swelling.  No relief with OTC medication. Patient notes no prior surgery, but does have prior kidney stones. He was well prior to the onset of symptoms aside from possible self diagnosed dehydration. Patient works in [employer].     Past Medical History:  Diagnosis Date  . Back pain   . Bipolar 1 disorder (HCC)   . Hepatitis C   . Kidney stones   . Schizo affective schizophrenia (HCC)   . Testicular cyst     There are no problems to display for this patient.   History reviewed. No pertinent surgical history.     Family History  Problem Relation Age of Onset  . Stroke Father   . Hypertension Father     Social History   Tobacco Use  . Smoking status: Current Every Day Smoker    Packs/day: 1.00    Years: 15.00    Pack years: 15.00    Types: Cigarettes  . Smokeless tobacco: Never Used  Vaping Use  . Vaping Use: Never used  Substance Use Topics  . Alcohol use: Not Currently  . Drug use: Yes    Frequency: 7.0 times per week    Types: Marijuana    Comment: Daily use; last use is 03/26/2019    Home Medications Prior to Admission medications   Medication Sig Start Date End Date Taking? Authorizing Provider  hydrOXYzine (ATARAX/VISTARIL) 25 MG tablet Take 1 tablet (25 mg total) by mouth every 8 (eight) hours as needed for anxiety. Patient not taking: Reported on 03/17/2019 06/18/18   Pricilla Loveless, MD  hydrOXYzine (ATARAX/VISTARIL) 25 MG tablet Take 1 tablet (25 mg total) by mouth every 6 (six) hours  as needed for anxiety. 03/26/19   Raeford Razor, MD  ondansetron (ZOFRAN ODT) 4 MG disintegrating tablet Take 1 tablet (4 mg total) by mouth every 8 (eight) hours as needed for nausea or vomiting. Patient not taking: Reported on 03/26/2019 03/17/19   Henderly, Britni A, PA-C  sertraline (ZOLOFT) 50 MG tablet Take 1 tablet (50 mg total) by mouth daily. 03/26/19   Raeford Razor, MD    Allergies    Latex  Review of Systems   Review of Systems  Constitutional:       Per HPI, otherwise negative  HENT:       Per HPI, otherwise negative  Respiratory:       Per HPI, otherwise negative  Cardiovascular:       Per HPI, otherwise negative  Gastrointestinal: Positive for abdominal pain, nausea and vomiting.  Endocrine:       Negative aside from HPI  Genitourinary:       Neg aside from HPI   Musculoskeletal:       Per HPI, otherwise negative  Skin: Negative.   Neurological: Negative for syncope.    Physical Exam Updated Vital Signs BP 136/72 (BP Location: Right Arm)   Pulse 73   Resp 18   Ht 6\' 1"  (1.854 m)  Wt 88.2 kg   SpO2 100%   BMI 25.66 kg/m   Physical Exam Vitals and nursing note reviewed.  Constitutional:      General: He is not in acute distress.    Appearance: He is well-developed.  HENT:     Head: Normocephalic and atraumatic.  Eyes:     Conjunctiva/sclera: Conjunctivae normal.  Cardiovascular:     Rate and Rhythm: Normal rate and regular rhythm.  Pulmonary:     Effort: Pulmonary effort is normal. No respiratory distress.     Breath sounds: No stridor.  Abdominal:     General: There is no distension.     Comments: Right flank pain, right lower quadrant pain  Genitourinary:    Comments: Patient denies changes to penis or scrotum. Skin:    General: Skin is warm and dry.  Neurological:     Mental Status: He is alert and oriented to person, place, and time.     ED Results / Procedures / Treatments   Labs (all labs ordered are listed, but only abnormal  results are displayed) Labs Reviewed  COMPREHENSIVE METABOLIC PANEL - Abnormal; Notable for the following components:      Result Value   Potassium 3.4 (*)    Glucose, Bld 129 (*)    All other components within normal limits  CBC WITH DIFFERENTIAL/PLATELET - Abnormal; Notable for the following components:   WBC 13.2 (*)    Neutro Abs 10.7 (*)    Abs Immature Granulocytes 0.08 (*)    All other components within normal limits  CK - Abnormal; Notable for the following components:   Total CK 1,050 (*)    All other components within normal limits  LIPASE, BLOOD  URINALYSIS, ROUTINE W REFLEX MICROSCOPIC     Radiology CT RENAL STONE STUDY  Result Date: 12/07/2020 CLINICAL DATA:  Right flank pain. EXAM: CT ABDOMEN AND PELVIS WITHOUT CONTRAST TECHNIQUE: Multidetector CT imaging of the abdomen and pelvis was performed following the standard protocol without IV contrast. COMPARISON:  March 17, 2019 FINDINGS: Lower chest: No acute abnormality. Hepatobiliary: A stable 6 mm diameter focus of parenchymal low attenuation is seen within the right lobe of the liver. No gallstones, gallbladder wall thickening, or biliary dilatation. Pancreas: Unremarkable. No pancreatic ductal dilatation or surrounding inflammatory changes. Spleen: Normal in size without focal abnormality. Adrenals/Urinary Tract: Adrenal glands are unremarkable. Kidneys are normal in size, without focal lesions. 2 mm nonobstructing renal stones are seen within both kidneys. A 4 mm obstructing renal stone is seen within the distal right ureter (axial CT image 65, CT series number 2). Mild to moderate severity right-sided hydronephrosis and hydroureter are noted. Bladder is unremarkable. Stomach/Bowel: Stomach is within normal limits. Appendix appears normal. No evidence of bowel wall thickening, distention, or inflammatory changes. Vascular/Lymphatic: Aortic atherosclerosis. No enlarged abdominal or pelvic lymph nodes. Reproductive: Prostate  is unremarkable. Other: No abdominal wall hernia or abnormality. No abdominopelvic ascites. Musculoskeletal: No acute or significant osseous findings. IMPRESSION: 1. 4 mm obstructing renal stone within the distal right ureter. 2. Stable hepatic cyst versus hemangioma. Electronically Signed   By: Aram Candela M.D.   On: 12/07/2020 22:38    Procedures Procedures   Medications Ordered in ED Medications  sodium chloride 0.9 % bolus 1,000 mL (0 mLs Intravenous Stopped 12/07/20 2158)    And  0.9 %  sodium chloride infusion ( Intravenous New Bag/Given 12/07/20 2100)  ondansetron (ZOFRAN) injection 4 mg (4 mg Intravenous Given 12/07/20 2100)  morphine 4 MG/ML  injection 4 mg (4 mg Intravenous Given 12/07/20 2101)  morphine 4 MG/ML injection 4 mg (4 mg Intravenous Given 12/07/20 2141)    ED Course  I have reviewed the triage vital signs and the nursing notes.  Pertinent labs & imaging results that were available during my care of the patient were reviewed by me and considered in my medical decision making (see chart for details).    11:11 PM Patient feeling better, vital signs unremarkable.  He is not accompanied by his father.  We discussed all findings including CT which I reviewed, consistent with 4 mm stone, not completely obstructive.  No evidence for concurrent infection.  Given his improvement here, patient may be appropriate for discharge with outpatient follow-up, but he is requiring additional fluids.  In addition to his diagnosis kidney stone, patient found to have substantial dehydration.  Patient amenable to outpatient follow-up. MDM Rules/Calculators/A&P MDM Number of Diagnoses or Management Options Dehydration: new, needed workup Nephrolithiasis: new, needed workup   Amount and/or Complexity of Data Reviewed Clinical lab tests: ordered and reviewed Tests in the radiology section of CPT: ordered and reviewed Tests in the medicine section of CPT: reviewed and ordered Decide to obtain  previous medical records or to obtain history from someone other than the patient: yes Obtain history from someone other than the patient: yes Review and summarize past medical records: yes Independent visualization of images, tracings, or specimens: yes  Risk of Complications, Morbidity, and/or Mortality Presenting problems: high Diagnostic procedures: high Management options: high  Critical Care Total time providing critical care: < 30 minutes  Patient Progress Patient progress: improved  Final Clinical Impression(s) / ED Diagnoses Final diagnoses:  Nephrolithiasis  Dehydration    Rx / DC Orders ED Discharge Orders         Ordered    HYDROcodone-acetaminophen (NORCO/VICODIN) 5-325 MG tablet  Every 6 hours PRN        12/07/20 2314    ondansetron (ZOFRAN) 4 MG tablet  Every 8 hours PRN        12/07/20 2314           Gerhard Munch, MD 12/07/20 2316

## 2020-12-08 MED ORDER — MORPHINE SULFATE (PF) 2 MG/ML IV SOLN
2.0000 mg | Freq: Once | INTRAVENOUS | Status: AC
  Administered 2020-12-08: 2 mg via INTRAVENOUS
  Filled 2020-12-08: qty 1

## 2021-04-17 ENCOUNTER — Encounter (HOSPITAL_COMMUNITY): Payer: Self-pay | Admitting: Emergency Medicine

## 2021-04-17 ENCOUNTER — Emergency Department (HOSPITAL_COMMUNITY)
Admission: EM | Admit: 2021-04-17 | Discharge: 2021-04-17 | Disposition: A | Discharge status: Home / Self Care | Payer: Medicaid Other | Attending: Emergency Medicine | Admitting: Emergency Medicine

## 2021-04-17 ENCOUNTER — Other Ambulatory Visit: Payer: Self-pay

## 2021-04-17 DIAGNOSIS — R440 Auditory hallucinations: Secondary | ICD-10-CM | POA: Insufficient documentation

## 2021-04-17 DIAGNOSIS — Z5321 Procedure and treatment not carried out due to patient leaving prior to being seen by health care provider: Secondary | ICD-10-CM | POA: Insufficient documentation

## 2021-04-17 NOTE — ED Triage Notes (Signed)
Pt from home via [facility]. Pt reports hearing voices and "feeling like people are out to get me." Pt denies SI/HI at this time.

## 2021-04-17 NOTE — ED Notes (Signed)
Pt seen leaving the ER with steady gate. Did not notify staff he was leaving.

## 2021-05-02 DIAGNOSIS — F29 Unspecified psychosis not due to a substance or known physiological condition: Secondary | ICD-10-CM | POA: Insufficient documentation

## 2021-05-02 DIAGNOSIS — Z91040 Latex allergy status: Secondary | ICD-10-CM | POA: Insufficient documentation

## 2021-05-02 DIAGNOSIS — Z79899 Other long term (current) drug therapy: Secondary | ICD-10-CM | POA: Insufficient documentation

## 2021-05-02 DIAGNOSIS — Z20822 Contact with and (suspected) exposure to covid-19: Secondary | ICD-10-CM | POA: Insufficient documentation

## 2021-05-02 DIAGNOSIS — F259 Schizoaffective disorder, unspecified: Secondary | ICD-10-CM | POA: Insufficient documentation

## 2021-05-02 DIAGNOSIS — F319 Bipolar disorder, unspecified: Secondary | ICD-10-CM | POA: Insufficient documentation

## 2021-05-02 DIAGNOSIS — F1290 Cannabis use, unspecified, uncomplicated: Secondary | ICD-10-CM | POA: Insufficient documentation

## 2021-05-02 DIAGNOSIS — F17210 Nicotine dependence, cigarettes, uncomplicated: Secondary | ICD-10-CM | POA: Insufficient documentation

## 2021-05-02 DIAGNOSIS — R079 Chest pain, unspecified: Secondary | ICD-10-CM | POA: Insufficient documentation

## 2021-05-03 ENCOUNTER — Inpatient Hospital Stay
Admission: RE | Admit: 2021-05-03 | Discharge: 2021-05-30 | DRG: 885 | Disposition: A | Discharge status: Home / Self Care | Payer: 59 | Source: Intra-hospital | Attending: Behavioral Health | Admitting: Behavioral Health

## 2021-05-03 ENCOUNTER — Emergency Department
Admission: EM | Admit: 2021-05-03 | Discharge: 2021-05-03 | Disposition: A | Discharge status: Home / Self Care | Payer: 59 | Attending: Emergency Medicine | Admitting: Emergency Medicine

## 2021-05-03 ENCOUNTER — Encounter: Payer: Self-pay | Admitting: Psychiatry

## 2021-05-03 ENCOUNTER — Encounter: Payer: Self-pay | Admitting: Emergency Medicine

## 2021-05-03 ENCOUNTER — Other Ambulatory Visit: Payer: Self-pay

## 2021-05-03 DIAGNOSIS — R443 Hallucinations, unspecified: Secondary | ICD-10-CM | POA: Diagnosis present

## 2021-05-03 DIAGNOSIS — R45851 Suicidal ideations: Secondary | ICD-10-CM | POA: Diagnosis present

## 2021-05-03 DIAGNOSIS — F3113 Bipolar disorder, current episode manic without psychotic features, severe: Secondary | ICD-10-CM | POA: Diagnosis present

## 2021-05-03 DIAGNOSIS — I10 Essential (primary) hypertension: Secondary | ICD-10-CM | POA: Diagnosis present

## 2021-05-03 DIAGNOSIS — F319 Bipolar disorder, unspecified: Secondary | ICD-10-CM | POA: Diagnosis not present

## 2021-05-03 DIAGNOSIS — Z20822 Contact with and (suspected) exposure to covid-19: Secondary | ICD-10-CM | POA: Diagnosis present

## 2021-05-03 DIAGNOSIS — F312 Bipolar disorder, current episode manic severe with psychotic features: Secondary | ICD-10-CM | POA: Diagnosis not present

## 2021-05-03 DIAGNOSIS — G4700 Insomnia, unspecified: Secondary | ICD-10-CM | POA: Diagnosis present

## 2021-05-03 DIAGNOSIS — F1220 Cannabis dependence, uncomplicated: Secondary | ICD-10-CM | POA: Diagnosis present

## 2021-05-03 DIAGNOSIS — F17210 Nicotine dependence, cigarettes, uncomplicated: Secondary | ICD-10-CM | POA: Diagnosis present

## 2021-05-03 DIAGNOSIS — F1290 Cannabis use, unspecified, uncomplicated: Secondary | ICD-10-CM

## 2021-05-03 DIAGNOSIS — F259 Schizoaffective disorder, unspecified: Secondary | ICD-10-CM | POA: Diagnosis present

## 2021-05-03 DIAGNOSIS — Z9151 Personal history of suicidal behavior: Secondary | ICD-10-CM | POA: Diagnosis not present

## 2021-05-03 DIAGNOSIS — F29 Unspecified psychosis not due to a substance or known physiological condition: Secondary | ICD-10-CM | POA: Diagnosis present

## 2021-05-03 LAB — RESP PANEL BY RT-PCR (FLU A&B, COVID) ARPGX2
Influenza A by PCR: NEGATIVE
Influenza B by PCR: NEGATIVE
SARS Coronavirus 2 by RT PCR: NEGATIVE

## 2021-05-03 LAB — CBC WITH DIFFERENTIAL/PLATELET
Abs Immature Granulocytes: 0.04 10*3/uL (ref 0.00–0.07)
Basophils Absolute: 0.1 10*3/uL (ref 0.0–0.1)
Basophils Relative: 1 %
Eosinophils Absolute: 0.1 10*3/uL (ref 0.0–0.5)
Eosinophils Relative: 1 %
HCT: 46.3 % (ref 39.0–52.0)
Hemoglobin: 15.7 g/dL (ref 13.0–17.0)
Immature Granulocytes: 0 %
Lymphocytes Relative: 17 %
Lymphs Abs: 1.7 10*3/uL (ref 0.7–4.0)
MCH: 33.1 pg (ref 26.0–34.0)
MCHC: 33.9 g/dL (ref 30.0–36.0)
MCV: 97.5 fL (ref 80.0–100.0)
Monocytes Absolute: 0.7 10*3/uL (ref 0.1–1.0)
Monocytes Relative: 7 %
Neutro Abs: 7.8 10*3/uL — ABNORMAL HIGH (ref 1.7–7.7)
Neutrophils Relative %: 74 %
Platelets: 191 10*3/uL (ref 150–400)
RBC: 4.75 MIL/uL (ref 4.22–5.81)
RDW: 13.4 % (ref 11.5–15.5)
WBC: 10.4 10*3/uL (ref 4.0–10.5)
nRBC: 0 % (ref 0.0–0.2)

## 2021-05-03 LAB — COMPREHENSIVE METABOLIC PANEL
ALT: 27 U/L (ref 0–44)
AST: 22 U/L (ref 15–41)
Albumin: 4.5 g/dL (ref 3.5–5.0)
Alkaline Phosphatase: 74 U/L (ref 38–126)
Anion gap: 7 (ref 5–15)
BUN: 18 mg/dL (ref 6–20)
CO2: 28 mmol/L (ref 22–32)
Calcium: 9.4 mg/dL (ref 8.9–10.3)
Chloride: 103 mmol/L (ref 98–111)
Creatinine, Ser: 0.83 mg/dL (ref 0.61–1.24)
GFR, Estimated: >60 mL/min (ref >60)
Glucose, Bld: 108 mg/dL — ABNORMAL HIGH (ref 70–99)
Potassium: 3.9 mmol/L (ref 3.5–5.1)
Sodium: 138 mmol/L (ref 135–145)
Total Bilirubin: 0.4 mg/dL (ref 0.3–1.2)
Total Protein: 8 g/dL (ref 6.5–8.1)

## 2021-05-03 LAB — URINE DRUG SCREEN, QUALITATIVE (ARMC ONLY)
Amphetamines, Ur Screen: NOT DETECTED
Barbiturates, Ur Screen: NOT DETECTED
Benzodiazepine, Ur Scrn: NOT DETECTED
Cannabinoid 50 Ng, Ur ~~LOC~~: POSITIVE — AB
Cocaine Metabolite,Ur ~~LOC~~: NOT DETECTED
MDMA (Ecstasy)Ur Screen: NOT DETECTED
Methadone Scn, Ur: NOT DETECTED
Opiate, Ur Screen: NOT DETECTED
Phencyclidine (PCP) Ur S: NOT DETECTED
Tricyclic, Ur Screen: NOT DETECTED

## 2021-05-03 LAB — ACETAMINOPHEN LEVEL: Acetaminophen (Tylenol), Serum: <10 ug/mL — ABNORMAL LOW (ref 10–30)

## 2021-05-03 LAB — SALICYLATE LEVEL: Salicylate Lvl: <7 mg/dL — ABNORMAL LOW (ref 7.0–30.0)

## 2021-05-03 LAB — TROPONIN I (HIGH SENSITIVITY): Troponin I (High Sensitivity): 8 ng/L (ref <18)

## 2021-05-03 MED ORDER — RISPERIDONE 1 MG PO TBDP
2.0000 mg | ORAL_TABLET | Freq: Every day | ORAL | Status: DC
  Administered 2021-05-03 – 2021-05-04 (×2): 2 mg via ORAL
  Filled 2021-05-03 (×2): qty 2

## 2021-05-03 MED ORDER — AMLODIPINE BESYLATE 5 MG PO TABS
5.0000 mg | ORAL_TABLET | Freq: Every day | ORAL | Status: DC
  Administered 2021-05-04 – 2021-05-30 (×27): 5 mg via ORAL
  Filled 2021-05-03 (×27): qty 1

## 2021-05-03 MED ORDER — CITALOPRAM HYDROBROMIDE 20 MG PO TABS
20.0000 mg | ORAL_TABLET | Freq: Every morning | ORAL | Status: DC
  Administered 2021-05-03: 20 mg via ORAL
  Filled 2021-05-03: qty 1

## 2021-05-03 MED ORDER — HYDRALAZINE HCL 10 MG PO TABS
10.0000 mg | ORAL_TABLET | Freq: Four times a day (QID) | ORAL | Status: DC | PRN
  Administered 2021-05-05 – 2021-05-08 (×2): 10 mg via ORAL
  Filled 2021-05-03 (×3): qty 1

## 2021-05-03 MED ORDER — MAGNESIUM HYDROXIDE 400 MG/5ML PO SUSP
30.0000 mL | Freq: Every day | ORAL | Status: DC | PRN
  Administered 2021-05-25 – 2021-05-28 (×2): 30 mL via ORAL
  Filled 2021-05-03 (×2): qty 30

## 2021-05-03 MED ORDER — TRAZODONE HCL 100 MG PO TABS
100.0000 mg | ORAL_TABLET | Freq: Every evening | ORAL | Status: DC | PRN
  Administered 2021-05-03 – 2021-05-11 (×7): 100 mg via ORAL
  Filled 2021-05-03 (×6): qty 1

## 2021-05-03 MED ORDER — AMLODIPINE BESYLATE 5 MG PO TABS
5.0000 mg | ORAL_TABLET | Freq: Every day | ORAL | Status: DC
  Administered 2021-05-03: 5 mg via ORAL
  Filled 2021-05-03: qty 1

## 2021-05-03 MED ORDER — OLANZAPINE 10 MG PO TABS
10.0000 mg | ORAL_TABLET | Freq: Four times a day (QID) | ORAL | Status: DC | PRN
  Administered 2021-05-08 – 2021-05-23 (×12): 10 mg via ORAL
  Filled 2021-05-03 (×12): qty 1

## 2021-05-03 MED ORDER — HYDRALAZINE HCL 10 MG PO TABS
10.0000 mg | ORAL_TABLET | Freq: Four times a day (QID) | ORAL | Status: DC | PRN
  Filled 2021-05-03: qty 1

## 2021-05-03 MED ORDER — ZIPRASIDONE MESYLATE 20 MG IM SOLR: 20.0000 mg | Freq: Four times a day (QID) | INTRAMUSCULAR | Status: DC | PRN

## 2021-05-03 MED ORDER — HYDROXYZINE HCL 50 MG PO TABS
50.0000 mg | ORAL_TABLET | Freq: Four times a day (QID) | ORAL | Status: DC | PRN
  Administered 2021-05-03 – 2021-05-30 (×30): 50 mg via ORAL
  Filled 2021-05-03 (×32): qty 1

## 2021-05-03 MED ORDER — ALUM & MAG HYDROXIDE-SIMETH 200-200-20 MG/5ML PO SUSP: 30.0000 mL | ORAL | Status: DC | PRN

## 2021-05-03 MED ORDER — HYDROXYZINE HCL 25 MG PO TABS: 25.0000 mg | ORAL_TABLET | Freq: Three times a day (TID) | ORAL | Status: DC | PRN

## 2021-05-03 MED ORDER — ACETAMINOPHEN 325 MG PO TABS
650.0000 mg | ORAL_TABLET | Freq: Four times a day (QID) | ORAL | Status: DC | PRN
  Administered 2021-05-06 – 2021-05-26 (×3): 650 mg via ORAL
  Filled 2021-05-03 (×2): qty 2

## 2021-05-03 NOTE — ED Triage Notes (Signed)
Pt states he has tried to cut himself recently and has been having both auditory and visual hallucinations, denies any at this time.

## 2021-05-03 NOTE — ED Provider Notes (Addendum)
[facility] Emergency Department Provider Note   ____________________________________________   Event Date/Time   First MD Initiated Contact with Patient 05/03/21 [phone]     (approximate)  I have reviewed the triage vital signs and the nursing notes.   HISTORY  Chief Complaint Psychiatric Evaluation    HPI Nicholas Nielsen is a 40 y.o. male brought to the ED under IVC for threatening self-harm and hallucinations.  Recent psychiatric hospitalization at [facility] a few weeks ago.  Endorses marijuana use.  Does complain of intermittent chest pains, none currently.  Voices no other complaints or injuries.  Currently denies SI/HI/AH/VH.      Past Medical History:  Diagnosis Date   Back pain    Bipolar 1 disorder (HCC)    Hepatitis C    Kidney stones    Schizo affective schizophrenia [facility])    Testicular cyst     Patient Active Problem List   Diagnosis Date Noted   Bipolar 1 disorder (HCC) 05/03/2021   Schizo affective schizophrenia (HCC) 05/03/2021   Hallucination 05/03/2021     History reviewed. No pertinent surgical history.  Prior to Admission medications   Medication Sig Start Date End Date Taking? Authorizing Provider  HYDROcodone-acetaminophen (NORCO/VICODIN) 5-325 MG tablet Take 1 tablet by mouth every 6 (six) hours as needed for severe pain. 12/07/20   Gerhard Munch, MD  hydrOXYzine (ATARAX/VISTARIL) 25 MG tablet Take 1 tablet (25 mg total) by mouth every 8 (eight) hours as needed for anxiety. Patient not taking: Reported on 03/17/2019 06/18/18   Pricilla Loveless, MD  hydrOXYzine (ATARAX/VISTARIL) 25 MG tablet Take 1 tablet (25 mg total) by mouth every 6 (six) hours as needed for anxiety. 03/26/19   Raeford Razor, MD  ondansetron (ZOFRAN ODT) 4 MG disintegrating tablet Take 1 tablet (4 mg total) by mouth every 8 (eight) hours as needed for nausea or vomiting. Patient not taking: Reported on 03/26/2019 03/17/19   Henderly, Britni A, PA-C   ondansetron (ZOFRAN) 4 MG tablet Take 1 tablet (4 mg total) by mouth every 8 (eight) hours as needed for nausea or vomiting. 12/07/20   Gerhard Munch, MD  sertraline (ZOLOFT) 50 MG tablet Take 1 tablet (50 mg total) by mouth daily. 03/26/19   Raeford Razor, MD    Allergies Quetiapine fumarate and Latex  Family History  Problem Relation Age of Onset   Stroke Father    Hypertension Father     Social History Social History   Tobacco Use   Smoking status: Every Day    Packs/day: 1.00    Years: 15.00    Pack years: 15.00    Types: Cigarettes   Smokeless tobacco: Never  Vaping Use   Vaping Use: Never used  Substance Use Topics   Alcohol use: Not Currently   Drug use: Yes    Frequency: 7.0 times per week    Types: Marijuana    Comment: Daily use; last use is 03/26/2019    Review of Systems  Constitutional: No fever/chills Eyes: No visual changes. ENT: No sore throat. Cardiovascular: Denies chest pain. Respiratory: Denies shortness of breath. Gastrointestinal: No abdominal pain.  No nausea, no vomiting.  No diarrhea.  No constipation. Genitourinary: Negative for dysuria. Musculoskeletal: Negative for back pain. Skin: Negative for rash. Neurological: Negative for headaches, focal weakness or numbness. Psychiatric: Positive for hallucinations.  ____________________________________________   PHYSICAL EXAM:  VITAL SIGNS: ED Triage Vitals  Enc Vitals Group     BP 05/03/21 0044 (!) 144/99  Pulse Rate 05/03/21 0044 87     Resp 05/03/21 0044 16     Temp 05/03/21 0044 98.2 F (36.8 C)     Temp Source 05/03/21 0044 Oral     SpO2 05/03/21 0044 97 %     Weight 05/03/21 0046 160 lb (72.6 kg)     Height 05/03/21 0046 6\' 1"  (1.854 m)     Head Circumference --      Peak Flow --      Pain Score 05/03/21 0044 0     Pain Loc --      Pain Edu? --      Excl. in GC? --     Constitutional: Alert and oriented. Well appearing and in no acute distress. Eyes: Conjunctivae  are normal. PERRL. EOMI. Head: Atraumatic. Nose: No congestion/rhinnorhea. Mouth/Throat: Mucous membranes are moist.   Neck: No stridor.   Cardiovascular: Normal rate, regular rhythm. Grossly normal heart sounds.  Good peripheral circulation. Respiratory: Normal respiratory effort.  No retractions. Lungs CTAB. Gastrointestinal: Soft and nontender. No distention. No abdominal bruits. No CVA tenderness. Musculoskeletal: No lower extremity tenderness nor edema.  No joint effusions. Neurologic:  Normal speech and language. No gross focal neurologic deficits are appreciated. No gait instability. Skin:  Skin is warm, dry and intact. No rash noted. Psychiatric: Mood and affect are flat. Speech and behavior are normal.  ____________________________________________   LABS (all labs ordered are listed, but only abnormal results are displayed)  Labs Reviewed  CBC WITH DIFFERENTIAL/PLATELET - Abnormal; Notable for the following components:      Result Value   Neutro Abs 7.8 (*)    All other components within normal limits  COMPREHENSIVE METABOLIC PANEL - Abnormal; Notable for the following components:   Glucose, Bld 108 (*)    All other components within normal limits  ACETAMINOPHEN LEVEL - Abnormal; Notable for the following components:   Acetaminophen (Tylenol), Serum <10 (*)    All other components within normal limits  SALICYLATE LEVEL - Abnormal; Notable for the following components:   Salicylate Lvl <7.0 (*)    All other components within normal limits  URINE DRUG SCREEN, QUALITATIVE ([facility] ONLY) - Abnormal; Notable for the following components:   Cannabinoid 50 Ng, [place] POSITIVE (*)    All other components within normal limits  RESP PANEL BY RT-PCR (FLU A&B, COVID) [id number]  TROPONIN I (HIGH SENSITIVITY)   ____________________________________________  EKG  ED ECG REPORT I, Pasqualino Witherspoon J, the attending physician, personally viewed and interpreted this ECG.   Date: 05/03/2021  EKG  Time: 0059  Rate: 78  Rhythm: normal sinus rhythm  Axis: Normal  Intervals:none  ST&T Change: Nonspecific  ____________________________________________  RADIOLOGY I, Dimas Scheck J, personally viewed and evaluated these images (plain radiographs) as part of my medical decision making, as well as reviewing the written report by the radiologist.  ED MD interpretation: None  Official radiology report(s): No results found.  ____________________________________________   PROCEDURES  Procedure(s) performed (including Critical Care):  Procedures   ____________________________________________   INITIAL IMPRESSION / ASSESSMENT AND PLAN / ED COURSE  As part of my medical decision making, I reviewed the following data within the electronic MEDICAL RECORD NUMBER Nursing notes reviewed and incorporated, Labs reviewed, EKG interpreted, Old chart reviewed, A consult was requested and obtained from this/these consultant(s) Psychiatry, and Notes from prior ED visits     40 year old male brought in under IVC for psychosis.  We will obtain troponin and EKG given patient's complaints of intermittent chest  pain. The patient has been placed in psychiatric observation due to the need to provide a safe environment for the patient while obtaining psychiatric consultation and evaluation, as well as ongoing medical and medication management to treat the patient's condition.  The patient has been placed under full IVC at this time.  ----------------------------------------- 1:45 AM on 05/03/2021 -----------------------------------------   EKG and troponin unremarkable.  Patient is medically cleared pending psychiatric evaluation and disposition.  ----------------------------------------- 4:21 AM on 05/03/2021 -----------------------------------------   Patient was seen by psychiatric NP who recommends admission.  Patient will remain in the ED under IVC until his  disposition.  ____________________________________________   FINAL CLINICAL IMPRESSION(S) / ED DIAGNOSES  Final diagnoses:  Psychosis, unspecified psychosis type (HCC)  Marijuana use     ED Discharge Orders     None        Note:  This document was prepared using Dragon voice recognition software and may include unintentional dictation errors.    Irean Hong, MD 05/03/21 2122    Irean Hong, MD 05/03/21 [phone]

## 2021-05-03 NOTE — ED Notes (Signed)
Vital signs deferred at this time due to patient sleeping.  
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
## 2021-05-03 NOTE — ED Notes (Signed)
Belongings:  2 necklaces, 1 silver ring, 1 wallet-no cash, winter hat, black slides, white socks, belt, jeans, gray and green t-shirt, white long sleeve shirt, white t-shirt, boxers.
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
## 2021-05-03 NOTE — Progress Notes (Signed)
   05/03/21 1725  Clinical Encounter Type  Visited With Patient  Visit Type Initial;Spiritual support;Social support  Referral From Nurse  Consult/Referral To Chaplain  Spiritual Encounters  Spiritual Needs Emotional;Other (Comment) ("help processing thoughts")  Chaplain Burris responded to a consult request to provide support to this new Pt on unit. Luna Fuse introduced herself and offered some education on her role. Chaplain inquired about the Pt's needs and made some initial assessments about pastoral care needs. Chaplain Burris made a plan with the Pt to check in tomorrow afternoon for a longer visit if still desired. Chaplain also let Pt know that a chaplain is available overnight if needed. Will plan to f/u on 11/2.

## 2021-05-03 NOTE — BH Assessment (Signed)
Referral information for Psychiatric Hospitalization faxed to:  [facility] ([phone]-or- [phone]),   [facility] ([phone]---[phone]),  [facility] ([phone]),   [facility] ([phone] -or- [phone]),   [facility] ([phone]).  [facility] ([phone])  
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
## 2021-05-03 NOTE — ED Notes (Signed)
Pt. To BHU from ED ambulatory without difficulty, to room  BHU 2. Report from [facility]. Pt. Is alert and oriented, warm and dry in no distress. Pt. Denies SI, HI, and AVH. Pt. Calm and cooperative. Pt states people in his house are watching him and telling him to kill himself. Patient is not currently hearing anyone tell him such things at this time. Patient states the other week he took all his medication at once and cut his left wrist. No open wounds currently on patient's left wrist. Pt. Made aware of security cameras and Q15 minute rounds. Pt. Encouraged to let Nursing staff know of any concerns or needs.

## 2021-05-03 NOTE — Plan of Care (Signed)
New admission  Problem: Education: Goal: Knowledge of [facility] Education information/materials will improve Outcome: Not Progressing Goal: Emotional status will improve Outcome: Not Progressing Goal: Mental status will improve Outcome: Not Progressing Goal: Verbalization of understanding the information provided will improve Outcome: Not Progressing   Problem: Activity: Goal: Interest or engagement in activities will improve Outcome: Not Progressing Goal: Sleeping patterns will improve Outcome: Not Progressing   Problem: Coping: Goal: Ability to verbalize frustrations and anger appropriately will improve Outcome: Not Progressing Goal: Ability to demonstrate self-control will improve Outcome: Not Progressing   Problem: Health Behavior/Discharge Planning: Goal: Identification of resources available to assist in meeting health care needs will improve Outcome: Not Progressing Goal: Compliance with treatment plan for underlying cause of condition will improve Outcome: Not Progressing   Problem: Physical Regulation: Goal: Ability to maintain clinical measurements within normal limits will improve Outcome: Not Progressing   Problem: Safety: Goal: Periods of time without injury will increase Outcome: Not Progressing   Problem: Education: Goal: Ability to state activities that reduce stress will improve Outcome: Not Progressing   Problem: Self-Concept: Goal: Ability to identify factors that promote anxiety will improve Outcome: Not Progressing Goal: Level of anxiety will decrease Outcome: Not Progressing Goal: Ability to modify response to factors that promote anxiety will improve Outcome: Not Progressing   Problem: Activity: Goal: Will verbalize the importance of balancing activity with adequate rest periods Outcome: Not Progressing   Problem: Education: Goal: Will be free of psychotic symptoms Outcome: Not Progressing Goal: Knowledge of the prescribed  therapeutic regimen will improve Outcome: Not Progressing   Problem: Coping: Goal: Coping ability will improve Outcome: Not Progressing Goal: Will verbalize feelings Outcome: Not Progressing   Problem: Self-Concept: Goal: Will verbalize positive feelings about self Outcome: Not Progressing   Problem: Self-Care: Goal: Ability to participate in self-care as condition permits will improve Outcome: Not Progressing   Problem: Safety: Goal: Ability to redirect hostility and anger into socially appropriate behaviors will improve Outcome: Not Progressing Goal: Ability to remain free from injury will improve Outcome: Not Progressing

## 2021-05-03 NOTE — ED Notes (Signed)
Patient provided with breakfast tray.

## 2021-05-04 DIAGNOSIS — F1220 Cannabis dependence, uncomplicated: Secondary | ICD-10-CM | POA: Diagnosis present

## 2021-05-04 DIAGNOSIS — F312 Bipolar disorder, current episode manic severe with psychotic features: Secondary | ICD-10-CM

## 2021-05-04 MED ORDER — BENZTROPINE MESYLATE 1 MG PO TABS
0.5000 mg | ORAL_TABLET | Freq: Every day | ORAL | Status: DC
  Administered 2021-05-04 – 2021-05-07 (×4): 0.5 mg via ORAL
  Filled 2021-05-04 (×4): qty 1

## 2021-05-04 MED ORDER — PROPRANOLOL HCL 20 MG PO TABS
10.0000 mg | ORAL_TABLET | Freq: Two times a day (BID) | ORAL | Status: DC
  Administered 2021-05-04 – 2021-05-05 (×3): 10 mg via ORAL
  Filled 2021-05-04 (×3): qty 1

## 2021-05-04 NOTE — [facility] Suicide Risk Assessment (Signed)
[facility] Admission Suicide Risk Assessment   Nursing information obtained from:  Patient Demographic factors:  Male, Caucasian, Low socioeconomic status Current Mental Status:  NA Loss Factors:  Decline in physical health Historical Factors:  Family history of suicide, Prior suicide attempts, Impulsivity Risk Reduction Factors:  Living with another person, especially a relative  Total Time spent with patient: 1 hour Principal Problem: Bipolar affective disorder, currently manic, severe, with psychotic features (HCC) Diagnosis:  Principal Problem:   Bipolar affective disorder, currently manic, severe, with psychotic features (HCC) Active Problems:   Cannabis use disorder, moderate, dependence (HCC)  Subjective Data: 40 year old male presenting for worsening psychosis, agitation, and poor self care. No acute events overnight, medication compliant, ADLs impaired. Patient seen during treatment team and he states his goal is to work on his medications and to get help with the feeling of chest tightness he is having secondary to anxiety. When seen one-on-one he notes that he has had about 3 weeks or worsening hallucinations. He has voices telling him deragatory things including commands to kill himself. He is also have visual hallucinations of people following him and trying to harm him. He believes these people are watching him and trying to fill him while he defecates or showers. This has led to him not showering for over a week. He has suicidal ideations. He has attempted suicide twice in the last 17 days vy trying to cut his wrist and overdose. He denies homicidal ideations. He sees a provider at [facility] in [place]. He is agreeable to starting Risperdal and Propranolol today.     Continued Clinical Symptoms:  Alcohol Use Disorder Identification Test Final Score (AUDIT): 2 The "Alcohol Use Disorders Identification Test", Guidelines for Use in Primary Care, Second Edition.  World Science writer  [facility]). Score between 0-7:  no or low risk or alcohol related problems. Score between 8-15:  moderate risk of alcohol related problems. Score between 16-19:  high risk of alcohol related problems. Score 20 or above:  warrants further diagnostic evaluation for alcohol dependence and treatment.   CLINICAL FACTORS:   Severe Anxiety and/or Agitation Bipolar Disorder:   Mixed State Alcohol/Substance Abuse/Dependencies Currently Psychotic Unstable or Poor Therapeutic Relationship   Musculoskeletal: Strength & Muscle Tone: within normal limits Gait & Station: normal Patient leans: N/A  Psychiatric Specialty Exam:  Presentation  General Appearance: Appropriate for Environment  Eye Contact:Good  Speech:Pressured  Speech Volume:Increased  Handedness:Right   Mood and Affect  Mood:Anxious  Affect:Congruent   Thought Process  Thought Processes:Disorganized  Descriptions of Associations:Tangential  Orientation:Full (Time, Place and Person)  Thought Content:Paranoid Ideation; Delusions  History of Schizophrenia/Schizoaffective disorder:Yes  Duration of Psychotic Symptoms:Greater than six months  Hallucinations:Hallucinations: Auditory; Visual Description of Auditory Hallucinations: "You are no good" Description of Visual Hallucinations: "I see people and they are trying to hurt me."  Ideas of Reference:Delusions; Paranoia  Suicidal Thoughts:Suicidal Thoughts: Yes, Active SI Active Intent and/or Plan: With Intent; With Plan; With Means to Carry Out; With Access to Means  Homicidal Thoughts:Homicidal Thoughts: No   Sensorium  Memory:Immediate Fair; Recent Fair; Remote Fair  Judgment:Poor  Insight:Shallow   Executive Functions  Concentration:Fair  Attention Span:Fair  Recall:Fair  Fund of Knowledge:Fair  Language:Fair   Psychomotor Activity  Psychomotor Activity:Psychomotor Activity: Restlessness   Assets  Assets:Communication Skills; Desire for  Improvement; Housing; Social Support   Sleep  Sleep:Sleep: Good Number of Hours of Sleep: 9    Physical Exam: Physical Exam ROS Blood pressure (!) 137/92, pulse (!) 113, temperature  97.8 F (36.6 C), temperature source Oral, resp. rate 18, height 6\' 1"  (1.854 m), weight 67.6 kg, SpO2 98 %. Body mass index is 19.66 kg/m.   COGNITIVE FEATURES THAT CONTRIBUTE TO RISK:  Closed-mindedness and Loss of executive function    SUICIDE RISK:   Moderate:  Frequent suicidal ideation with limited intensity, and duration, some specificity in terms of plans, no associated intent, good self-control, limited dysphoria/symptomatology, some risk factors present, and identifiable protective factors, including available and accessible social support.  PLAN OF CARE: Continue inpatient admission, see H&P for details.   I certify that inpatient services furnished can reasonably be expected to improve the patient's condition.   , MD 05/04/2021, 10:06 AM

## 2021-05-04 NOTE — Progress Notes (Signed)
   05/04/21 1820  Clinical Encounter Type  Visited With Patient  Visit Type Follow-up;Spiritual support;Social support  Spiritual Encounters  Spiritual Needs Emotional;Other (Comment) (social support)  Daryel November f/u on request from 11/1 for visit. Met with Pt in his room. Pt was congenial and soft spoken; Pt shared feelings, concerns for safety, and concern that our conversation may not be private. Chaplain Burris provided a compassionate, non-anxious presence and active listening. Chaplain acknowledged Pt's concerns by meeting the Pt where they are in their thoughts, offering empathetic support for how challenging this must feel for them. Chaplain acknowledged that although she could not change what Pt perceives she identified herself as a source of care and supportive presence. Will continue to follow.

## 2021-05-04 NOTE — H&P (Signed)
Psychiatric Admission Assessment Adult  Patient Identification: Nicholas Nielsen MRN:  [id number] Date of Evaluation:  05/04/2021 Chief Complaint:  Bipolar affective disorder, currently manic, severe, with psychotic features (HCC) [F31.2] Principal Diagnosis: Bipolar affective disorder, currently manic, severe, with psychotic features (HCC) Diagnosis:  Principal Problem:   Bipolar affective disorder, currently manic, severe, with psychotic features (HCC) Active Problems:   Cannabis use disorder, moderate, dependence (HCC)  CC "Feel like my heart is fluttering."  History of Present Illness: 40 year old male presenting for worsening psychosis, agitation, and poor self care. No acute events overnight, medication compliant, ADLs impaired. Patient seen during treatment team and he states his goal is to work on his medications and to get help with the feeling of chest tightness he is having secondary to anxiety. When seen one-on-one he notes that he has had about 3 weeks or worsening hallucinations. He has voices telling him deragatory things including commands to kill himself. He is also have visual hallucinations of people following him and trying to harm him. He believes these people are watching him and trying to fill him while he defecates or showers. This has led to him not showering for over a week. He has suicidal ideations. He has attempted suicide twice in the last 17 days vy trying to cut his wrist and overdose. He denies homicidal ideations. He sees a provider at [facility] in [place]. He is agreeable to starting Risperdal and Propranolol today.   Associated Signs/Symptoms: Depression Symptoms:  depressed mood, insomnia, difficulty concentrating, hopelessness, recurrent thoughts of death, suicidal thoughts with specific plan, suicidal attempt, anxiety, Duration of Depression Symptoms: No data recorded (Hypo) Manic Symptoms:  Delusions, Hallucinations, Labiality of Mood, Anxiety  Symptoms:  Excessive Worry, Panic Symptoms, Psychotic Symptoms:  Delusions, Hallucinations: Auditory Visual Paranoia, PTSD Symptoms: Negative Total Time spent with patient: 1 hour  Past Psychiatric History: Currently sees [facility] in [place]. Diagnosis has varied from Bipolar I disorder with psychotic features, schizophrenia, and schizoaffective disorder, bipolar type. Patient reports receiving Klonopin in the past, but per PMP aware review this is not curently prescribed, and last given in 2015.  Per chart review, has received citalopram, trazodone, and tegretol in the past. History of suicide attempts. History of prior admissions.   Is the patient at risk to self? Yes.    Has the patient been a risk to self in the past 6 months? Yes.    Has the patient been a risk to self within the distant past? Yes.    Is the patient a risk to others? No.  Has the patient been a risk to others in the past 6 months? No.  Has the patient been a risk to others within the distant past? No.   Prior Inpatient Therapy:   Prior Outpatient Therapy:    Alcohol Screening: 1. How often do you have a drink containing alcohol?: Monthly or less 2. How many drinks containing alcohol do you have on a typical day when you are drinking?: 1 or 2 3. How often do you have six or more drinks on one occasion?: Less than monthly AUDIT-C Score: 2 4. How often during the last year have you found that you were not able to stop drinking once you had started?: Never 5. How often during the last year have you failed to do what was normally expected from you because of drinking?: Never 6. How often during the last year have you needed a first drink in the morning to get yourself going after a heavy  drinking session?: Never 7. How often during the last year have you had a feeling of guilt of remorse after drinking?: Never 8. How often during the last year have you been unable to remember what happened the night before because you  had been drinking?: Never 9. Have you or someone else been injured as a result of your drinking?: No 10. Has a relative or friend or a doctor or another health worker been concerned about your drinking or suggested you cut down?: No Alcohol Use Disorder Identification Test Final Score (AUDIT): 2 Substance Abuse History in the last 12 months:  Yes.   Consequences of Substance Abuse: Worsening mental health Previous Psychotropic Medications: Yes  Psychological Evaluations: Yes  Past Medical History:  Past Medical History:  Diagnosis Date   Back pain    Bipolar 1 disorder (HCC)    Hepatitis C    Kidney stones    Schizo affective schizophrenia [facility])    Testicular cyst    History reviewed. No pertinent surgical history. Family History:  Family History  Problem Relation Age of Onset   Stroke Father    Hypertension Father    Family Psychiatric  History: None reported Tobacco Screening:   Social History:  Social History   Substance and Sexual Activity  Alcohol Use Not Currently     Social History   Substance and Sexual Activity  Drug Use Yes   Frequency: 7.0 times per week   Types: Marijuana   Comment: Daily use; last use is 03/26/2019    Additional Social History:                           Allergies:   Allergies  Allergen Reactions   Quetiapine Fumarate Other (See Comments)    Wake up with "Locked jaw" with high doses, 300-400 mg   Latex Rash   Lab Results:  Results for orders placed or performed during the hospital encounter of 05/03/21 (from the past 48 hour(s))  Resp Panel by RT-PCR (Flu A&B, Covid) Nasopharyngeal Swab     Status: None   Collection Time: 05/03/21  1:04 AM   Specimen: Nasopharyngeal Swab; Nasopharyngeal(NP) swabs in vial transport medium  Result Value Ref Range   SARS Coronavirus 2 by RT PCR NEGATIVE NEGATIVE    Comment: (NOTE) SARS-CoV-2 target nucleic acids are NOT DETECTED.  The SARS-CoV-2 RNA is generally detectable in upper  respiratory specimens during the acute phase of infection. The lowest concentration of SARS-CoV-2 viral copies this assay can detect is 138 copies/mL. A negative result does not preclude SARS-Cov-2 infection and should not be used as the sole basis for treatment or other patient management decisions. A negative result may occur with  improper specimen collection/handling, submission of specimen other than nasopharyngeal swab, presence of viral mutation(s) within the areas targeted by this assay, and inadequate number of viral copies(<138 copies/mL). A negative result must be combined with clinical observations, patient history, and epidemiological information. The expected result is Negative.  Fact Sheet for Patients:  BloggerCourse.com  Fact Sheet for Healthcare Providers:  SeriousBroker.it  This test is no t yet approved or cleared by the [place] FDA and  has been authorized for detection and/or diagnosis of SARS-CoV-2 by FDA under an Emergency Use Authorization (EUA). This EUA will remain  in effect (meaning this test can be used) for the duration of the COVID-19 declaration under Section 564(b)(1) of the Act, 21 U.S.C.section 360bbb-3(b)(1), unless the authorization is terminated  or revoked sooner.       Influenza A by PCR NEGATIVE NEGATIVE   Influenza B by PCR NEGATIVE NEGATIVE    Comment: (NOTE) The Xpert Xpress SARS-CoV-2/FLU/RSV plus assay is intended as an aid in the diagnosis of influenza from Nasopharyngeal swab specimens and should not be used as a sole basis for treatment. Nasal washings and aspirates are unacceptable for Xpert Xpress SARS-CoV-2/FLU/RSV testing.  Fact Sheet for Patients: BloggerCourse.com  Fact Sheet for Healthcare Providers: SeriousBroker.it  This test is not yet approved or cleared by the [place] FDA and has been authorized for  detection and/or diagnosis of SARS-CoV-2 by FDA under an Emergency Use Authorization (EUA). This EUA will remain in effect (meaning this test can be used) for the duration of the COVID-19 declaration under Section 564(b)(1) of the Act, 21 U.S.C. section 360bbb-3(b)(1), unless the authorization is terminated or revoked.  Performed at [facility], [address] [place], [place] [address]   Troponin I (High Sensitivity)     Status: None   Collection Time: 05/03/21  1:04 AM  Result Value Ref Range   Troponin I (High Sensitivity) 8 <18 ng/L    Comment: (NOTE) Elevated high sensitivity troponin I (hsTnI) values and significant  changes across serial measurements may suggest ACS but many other  chronic and acute conditions are known to elevate hsTnI results.  Refer to the "Links" section for chest pain algorithms and additional  guidance. Performed at [facility], [address] [place], [place] [address]   CBC with Differential     Status: Abnormal   Collection Time: 05/03/21  1:04 AM  Result Value Ref Range   WBC 10.4 4.0 - 10.5 K/uL   RBC 4.75 4.22 - 5.81 MIL/uL   Hemoglobin 15.7 13.0 - 17.0 g/dL   HCT 09.8 11.9 - 14.7 %   MCV 97.5 80.0 - 100.0 fL   MCH 33.1 26.0 - 34.0 pg   MCHC 33.9 30.0 - 36.0 g/dL   RDW 82.9 56.2 - 13.0 %   Platelets 191 150 - 400 K/uL   nRBC 0.0 0.0 - 0.2 %   Neutrophils Relative % 74 %   Neutro Abs 7.8 (H) 1.7 - 7.7 K/uL   Lymphocytes Relative 17 %   Lymphs Abs 1.7 0.7 - 4.0 K/uL   Monocytes Relative 7 %   Monocytes Absolute 0.7 0.1 - 1.0 K/uL   Eosinophils Relative 1 %   Eosinophils Absolute 0.1 0.0 - 0.5 K/uL   Basophils Relative 1 %   Basophils Absolute 0.1 0.0 - 0.1 K/uL   Immature Granulocytes 0 %   Abs Immature Granulocytes 0.04 0.00 - 0.07 K/uL    Comment: Performed at [facility], [address] [place], [place] [address]  Comprehensive metabolic panel     Status: Abnormal   Collection Time: 05/03/21   1:04 AM  Result Value Ref Range   Sodium 138 135 - 145 mmol/L   Potassium 3.9 3.5 - 5.1 mmol/L   Chloride 103 98 - 111 mmol/L   CO2 28 22 - 32 mmol/L   Glucose, Bld 108 (H) 70 - 99 mg/dL    Comment: Glucose reference range applies only to samples taken after fasting for at least 8 hours.   BUN 18 6 - 20 mg/dL   Creatinine, Ser 4.69 0.61 - 1.24 mg/dL   Calcium 9.4 8.9 - 62.9 mg/dL   Total Protein 8.0 6.5 - 8.1 g/dL   Albumin 4.5 3.5 - 5.0  g/dL   AST 22 15 - 41 U/L   ALT 27 0 - 44 U/L   Alkaline Phosphatase 74 38 - 126 U/L   Total Bilirubin 0.4 0.3 - 1.2 mg/dL   GFR, Estimated >56 >38 mL/min    Comment: (NOTE) Calculated using the CKD-EPI Creatinine Equation (2021)    Anion gap 7 5 - 15    Comment: Performed at [facility], [address] [place], [place] [address]  Acetaminophen level     Status: Abnormal   Collection Time: 05/03/21  1:04 AM  Result Value Ref Range   Acetaminophen (Tylenol), Serum <10 (L) 10 - 30 ug/mL    Comment: (NOTE) Therapeutic concentrations vary significantly. A range of 10-30 ug/mL  may be an effective concentration for many patients. However, some  are best treated at concentrations outside of this range. Acetaminophen concentrations >150 ug/mL at 4 hours after ingestion  and >50 ug/mL at 12 hours after ingestion are often associated with  toxic reactions.  Performed at [facility], [address] [place], [place] [address]   Salicylate level     Status: Abnormal   Collection Time: 05/03/21  1:04 AM  Result Value Ref Range   Salicylate Lvl <7.0 (L) 7.0 - 30.0 mg/dL    Comment: Performed at [facility], [address], [place], [place] [address]  Urine Drug Screen, Qualitative     Status: Abnormal   Collection Time: 05/03/21  1:04 AM  Result Value Ref Range   Tricyclic, Ur Screen NONE DETECTED NONE DETECTED   Amphetamines, Ur Screen NONE DETECTED NONE DETECTED   MDMA (Ecstasy)Ur Screen NONE DETECTED NONE  DETECTED   Cocaine Metabolite,Ur [place] NONE DETECTED NONE DETECTED   Opiate, Ur Screen NONE DETECTED NONE DETECTED   Phencyclidine (PCP) Ur S NONE DETECTED NONE DETECTED   Cannabinoid 50 Ng, Ur [place] POSITIVE (A) NONE DETECTED   Barbiturates, Ur Screen NONE DETECTED NONE DETECTED   Benzodiazepine, Ur Scrn NONE DETECTED NONE DETECTED   Methadone Scn, Ur NONE DETECTED NONE DETECTED    Comment: (NOTE) Tricyclics + metabolites, urine    Cutoff 1000 ng/mL Amphetamines + metabolites, urine  Cutoff 1000 ng/mL MDMA (Ecstasy), urine              Cutoff 500 ng/mL Cocaine Metabolite, urine          Cutoff 300 ng/mL Opiate + metabolites, urine        Cutoff 300 ng/mL Phencyclidine (PCP), urine         Cutoff 25 ng/mL Cannabinoid, urine                 Cutoff 50 ng/mL Barbiturates + metabolites, urine  Cutoff 200 ng/mL Benzodiazepine, urine              Cutoff 200 ng/mL Methadone, urine                   Cutoff 300 ng/mL  The urine drug screen provides only a preliminary, unconfirmed analytical test result and should not be used for non-medical purposes. Clinical consideration and professional judgment should be applied to any positive drug screen result due to possible interfering substances. A more specific alternate chemical method must be used in order to obtain a confirmed analytical result. Gas chromatography / mass spectrometry (GC/MS) is the preferred confirm atory method. Performed at [facility], [address] [place], [place] [address]     Blood Alcohol level:  Lab Results  Component  Value Date   [facility] <10 03/26/2019   ETH <11 10/11/2011    Metabolic Disorder Labs:  No results found for: HGBA1C, MPG No results found for: PROLACTIN No results found for: CHOL, TRIG, HDL, CHOLHDL, VLDL, LDLCALC  Current Medications: Current Facility-Administered Medications  Medication Dose Route Frequency Provider Last Rate Last Admin   acetaminophen (TYLENOL) tablet 650 mg  650 mg  Oral Q6H PRN Clapacs, John T, MD       alum & mag hydroxide-simeth (MAALOX/MYLANTA) 200-200-20 MG/5ML suspension 30 mL  30 mL Oral Q4H PRN Clapacs, John T, MD       amLODipine (NORVASC) tablet 5 mg  5 mg Oral Daily Les Pou M, MD   5 mg at 05/04/21 0801   benztropine (COGENTIN) tablet 0.5 mg  0.5 mg Oral QHS Jesse Sans, MD       hydrALAZINE (APRESOLINE) tablet 10 mg  10 mg Oral Q6H PRN Jesse Sans, MD       hydrOXYzine (ATARAX/VISTARIL) tablet 50 mg  50 mg Oral Q6H PRN Clapacs, John T, MD   50 mg at 05/04/21 0803   magnesium hydroxide (MILK OF MAGNESIA) suspension 30 mL  30 mL Oral Daily PRN Clapacs, Jackquline Denmark, MD       OLANZapine (ZYPREXA) tablet 10 mg  10 mg Oral Q6H PRN Jesse Sans, MD       propranolol (INDERAL) tablet 10 mg  10 mg Oral BID Jesse Sans, MD       risperiDONE (RISPERDAL M-TABS) disintegrating tablet 2 mg  2 mg Oral QHS Jesse Sans, MD   2 mg at 05/03/21 2201   traZODone (DESYREL) tablet 100 mg  100 mg Oral QHS PRN Jesse Sans, MD   100 mg at 05/03/21 2204   ziprasidone (GEODON) injection 20 mg  20 mg Intramuscular Q6H PRN Jesse Sans, MD       PTA Medications: Medications Prior to Admission  Medication Sig Dispense Refill Last Dose   citalopram (CELEXA) 20 MG tablet Take 20 mg by mouth every morning.      HYDROcodone-acetaminophen (NORCO/VICODIN) 5-325 MG tablet Take 1 tablet by mouth every 6 (six) hours as needed for severe pain. (Patient not taking: No sig reported) 10 tablet 0    hydrOXYzine (ATARAX/VISTARIL) 25 MG tablet Take 1 tablet (25 mg total) by mouth every 8 (eight) hours as needed for anxiety. (Patient not taking: Reported on 03/17/2019) 15 tablet 0    hydrOXYzine (ATARAX/VISTARIL) 25 MG tablet Take 1 tablet (25 mg total) by mouth every 6 (six) hours as needed for anxiety. (Patient not taking: No sig reported) 30 tablet 2    ondansetron (ZOFRAN ODT) 4 MG disintegrating tablet Take 1 tablet (4 mg total) by mouth every 8 (eight)  hours as needed for nausea or vomiting. (Patient not taking: Reported on 03/26/2019) 20 tablet 0    ondansetron (ZOFRAN) 4 MG tablet Take 1 tablet (4 mg total) by mouth every 8 (eight) hours as needed for nausea or vomiting. (Patient not taking: Reported on 05/03/2021) 15 tablet 0    sertraline (ZOLOFT) 50 MG tablet Take 1 tablet (50 mg total) by mouth daily. (Patient not taking: Reported on 05/03/2021) 30 tablet 1    traZODone (DESYREL) 50 MG tablet Take 50 mg by mouth at bedtime.       Musculoskeletal: Strength & Muscle Tone: within normal limits Gait & Station: normal Patient leans: N/A  Psychiatric Specialty Exam:  Presentation  General Appearance: Appropriate for Environment  Eye Contact:Good  Speech:Pressured  Speech Volume:Increased  Handedness:Right   Mood and Affect  Mood:Anxious  Affect:Congruent   Thought Process  Thought Processes:Disorganized  Duration of Psychotic Symptoms: Greater than six months  Past Diagnosis of Schizophrenia or Psychoactive disorder: Yes  Descriptions of Associations:Tangential  Orientation:Full (Time, Place and Person)  Thought Content:Paranoid Ideation; Delusions  Hallucinations:Hallucinations: Auditory; Visual Description of Auditory Hallucinations: "You are no good" Description of Visual Hallucinations: "I see people and they are trying to hurt me."  Ideas of Reference:Delusions; Paranoia  Suicidal Thoughts:Suicidal Thoughts: Yes, Active SI Active Intent and/or Plan: With Intent; With Plan; With Means to Carry Out; With Access to Means  Homicidal Thoughts:Homicidal Thoughts: No   Sensorium  Memory:Immediate Fair; Recent Fair; Remote Fair  Judgment:Poor  Insight:Shallow   Executive Functions  Concentration:Fair  Attention Span:Fair  Recall:Fair  Fund of Knowledge:Fair  Language:Fair   Psychomotor Activity  Psychomotor Activity:Psychomotor Activity: Restlessness   Assets   Assets:Communication Skills; Desire for Improvement; Housing; Social Support   Sleep  Sleep:Sleep: Good Number of Hours of Sleep: 9    Physical Exam: Physical Exam Vitals and nursing note reviewed.  Constitutional:      Appearance: Normal appearance.  HENT:     Head: Normocephalic and atraumatic.     Right Ear: External ear normal.     Left Ear: External ear normal.     Nose: Nose normal.     Mouth/Throat:     Mouth: Mucous membranes are moist.     Pharynx: Oropharynx is clear.  Eyes:     Extraocular Movements: Extraocular movements intact.     Conjunctiva/sclera: Conjunctivae normal.     Pupils: Pupils are equal, round, and reactive to light.  Cardiovascular:     Rate and Rhythm: Normal rate.     Pulses: Normal pulses.     Heart sounds: Normal heart sounds.  Pulmonary:     Effort: Pulmonary effort is normal.     Breath sounds: Normal breath sounds.  Abdominal:     General: Abdomen is flat. Bowel sounds are normal.  Musculoskeletal:        General: No swelling. Normal range of motion.     Cervical back: Normal range of motion.  Skin:    General: Skin is warm and dry.  Neurological:     General: No focal deficit present.     Mental Status: He is alert and oriented to person, place, and time.  Psychiatric:        Attention and Perception: He is inattentive. He perceives auditory and visual hallucinations.        Mood and Affect: Mood is anxious.        Speech: Speech is rapid and pressured.        Behavior: Behavior is agitated.        Thought Content: Thought content is paranoid and delusional. Thought content includes suicidal ideation.        Cognition and Memory: Cognition is impaired. Memory is impaired.        Judgment: Judgment is inappropriate.   Review of Systems  Constitutional: Negative.   HENT: Negative.    Eyes: Negative.   Respiratory: Negative.    Cardiovascular:  Positive for palpitations. Negative for chest pain.  Gastrointestinal: Negative.    Genitourinary: Negative.   Musculoskeletal: Negative.   Skin: Negative.   Neurological: Negative.   Endo/Heme/Allergies:  Positive for environmental allergies. Does not bruise/bleed easily.  Psychiatric/Behavioral:  Positive for depression, hallucinations, substance abuse and suicidal ideas. Negative for memory loss. The patient is nervous/anxious and has insomnia.   Blood pressure (!) 137/92, pulse (!) 113, temperature 97.8 F (36.6 C), temperature source Oral, resp. rate 18, height 6\' 1"  (1.854 m), weight 67.6 kg, SpO2 98 %. Body mass index is 19.66 kg/m.  Treatment Plan Summary: Daily contact with patient to assess and evaluate symptoms and progress in treatment and Medication management 40 year old male presenting for worsening psychosis, agitation, and poor self care. Endorsing AH/VH/SI today, denies HI. Appears visibly jittery and anxious. Continue Risperdal 2 mg QHS, add cogentin 0.5 mg QHS for EPS prophylaxis. Start propranolol 10 mg BID for tachycardia and anxiety, titrate as needed. Hydroxyzine PRN for anxiety. Norvasc 5 mg daily for HTN, titrate as needed. PRNs in place for agitation though calm and cooperative thus far. Lipid panel and hemoglobin a1c pending. Cannabis cessation counseling.   Observation Level/Precautions:  15 minute checks  Laboratory:  HbAIC Lipid panel  Psychotherapy:    Medications:    Consultations:    Discharge Concerns:    Estimated LOS:  Other:     Physician Treatment Plan for Primary Diagnosis: Bipolar affective disorder, currently manic, severe, with psychotic features (HCC) Long Term Goal(s): Improvement in symptoms so as ready for discharge  Short Term Goals: Ability to identify changes in lifestyle to reduce recurrence of condition will improve, Ability to verbalize feelings will improve, Ability to disclose and discuss suicidal ideas, Ability to demonstrate self-control will improve, Ability to identify and develop effective coping behaviors will  improve, Ability to maintain clinical measurements within normal limits will improve, Compliance with prescribed medications will improve, and Ability to identify triggers associated with substance abuse/mental health issues will improve  Physician Treatment Plan for Secondary Diagnosis: Principal Problem:   Bipolar affective disorder, currently manic, severe, with psychotic features (HCC) Active Problems:   Cannabis use disorder, moderate, dependence (HCC)  Long Term Goal(s): Improvement in symptoms so as ready for discharge  Short Term Goals: Ability to identify changes in lifestyle to reduce recurrence of condition will improve, Ability to verbalize feelings will improve, Ability to disclose and discuss suicidal ideas, Ability to demonstrate self-control will improve, Ability to identify and develop effective coping behaviors will improve, Ability to maintain clinical measurements within normal limits will improve, Compliance with prescribed medications will improve, and Ability to identify triggers associated with substance abuse/mental health issues will improve  I certify that inpatient services furnished can reasonably be expected to improve the patient's condition.    43, MD 11/2/202210:06 AM

## 2021-05-04 NOTE — Plan of Care (Signed)
Patient alert and calm on unit during shift thus far. Patient isolates and has limited participation in therapeutic milieu. Patient denies S/HI but did endorse AH "I heard voices and they woke me up out of my sleep". VH "I seen flashes in the corners of my eyes".  Patient compliant with medications as ordered per Provider.  Patient did rate Anxiety/Depression as 7/10 and did request some medication to manage symptoms.  Patient in no apparent distress. Writer did educate patient of importance of treatment plan at discharge. Q 15 minute safety checks will continue.  Patient informed to notify nursing staff of if any needs arise.   Problem: Education: Goal: Knowledge of [facility] Education information/materials will improve Outcome: Progressing Goal: Emotional status will improve Outcome: Progressing Goal: Mental status will improve Outcome: Progressing

## 2021-05-04 NOTE — [facility] Suicide Risk Assessment (Signed)
[facility] INPATIENT:  Family/Significant Other Suicide Prevention Education  Suicide Prevention Education:  Patient Refusal for Family/Significant Other Suicide Prevention Education: The patient Nicholas Nielsen has refused to provide written consent for family/significant other to be provided Family/Significant Other Suicide Prevention Education during admission and/or prior to discharge.  Physician notified.  SPE completed with pt, as pt refused to consent to family contact. SPI pamphlet provided to pt and pt was encouraged to share information with support network, ask questions, and talk about any concerns relating to SPE. Pt denies access to guns/firearms and verbalized understanding of information provided. Mobile Crisis information also provided to pt.  Glenis Smoker 05/04/2021, 4:00 PM

## 2021-05-04 NOTE — Progress Notes (Signed)
Initial Nutrition Assessment  DOCUMENTATION CODES:   Not applicable  INTERVENTION:   Ensure Enlive po TID, each supplement provides 350 kcal and 20 grams of protein  MVI po daily   NUTRITION DIAGNOSIS:   Predicted suboptimal nutrient intake related to social / environmental circumstances as evidenced by other (comment) (per chart review).  GOAL:   Patient will meet greater than or equal to 90% of their needs  MONITOR:   PO intake, Supplement acceptance, Labs, Weight trends, Skin, I & O's  REASON FOR ASSESSMENT:   Malnutrition Screening Tool    ASSESSMENT:   40 y/o male with h/o substance abuse, bipolar disease, hepatitis C and schizophrenia who is admitted IVC.  Suspect pt with decreased appetite and oral intake at baseline r/t social environmental circumstances. RD will add supplements and MVI to help pt meet his estimated needs. Per chart, pt appears to be down 45lbs(24%) over the past 5 months; this is significant weight loss.   Medications and labs reviewed:   Diet Order:   Diet Order             Diet regular Room service appropriate? Yes; Fluid consistency: Thin  Diet effective now                  EDUCATION NEEDS:   No education needs have been identified at this time  Skin:  Skin Assessment: Reviewed RN Assessment  Last BM:  11/1  Height:   Ht Readings from Last 1 Encounters:  05/03/21 6\' 1"  (1.854 m)    Weight:   Wt Readings from Last 1 Encounters:  05/03/21 67.6 kg   BMI:  Body mass index is 19.66 kg/m.  Estimated Nutritional Needs:   Kcal:  2200-2500kcal/day  Protein:  110-125g/day  Fluid:  2.2-2.5L/day  13/01/22 MS, RD, LDN Please refer to [facility] for RD and/or RD on-call/weekend/after hours pager

## 2021-05-04 NOTE — Group Note (Signed)
[facility] LCSW Group Therapy Note   Group Date: 05/04/2021 Start Time: 1300 End Time: 1400   Type of Therapy/Topic:  Group Therapy:  Emotion Regulation  Participation Level:  Did Not Attend   Mood:  Description of Group:    The purpose of this group is to assist patients in learning to regulate negative emotions and experience positive emotions. Patients will be guided to discuss ways in which they have been vulnerable to their negative emotions. These vulnerabilities will be juxtaposed with experiences of positive emotions or situations, and patients challenged to use positive emotions to combat negative ones. Special emphasis will be placed on coping with negative emotions in conflict situations, and patients will process healthy conflict resolution skills.  Therapeutic Goals: Patient will identify two positive emotions or experiences to reflect on in order to balance out negative emotions:  Patient will label two or more emotions that they find the most difficult to experience:  Patient will be able to demonstrate positive conflict resolution skills through discussion or role plays:   Summary of Patient Progress:   CSW called for group.  However, pt declined to attend.     Therapeutic Modalities:   Cognitive Behavioral Therapy Feelings Identification Dialectical Behavioral Therapy   Gwendolyn Nishi J Chelcee Korpi, LCSW 
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
## 2021-05-04 NOTE — Progress Notes (Signed)
Patient endorses Auditory hallucinations stated he smoke marijuana two weeks ago and " I passed out when I woke up my hand would not move, I think the weed was laced" Patient stated he is not the same since then. He continues to be very anxious and reports sleep disturbances.  Prn Atarax 50 mg PO given at 2204 and trazodone 100 mg PO for sleep. Patient in bed sleeping respirations noted. No adverse drug noted.   Support and encouragement ongoing. Patient remains safe.

## 2021-05-04 NOTE — BH IP Treatment Plan (Signed)
Interdisciplinary Treatment and Diagnostic Plan Update  05/04/2021 Time of Session: 9:00AM JAXTEN BROSH MRN: [id number]  Principal Diagnosis: Bipolar affective disorder, currently manic, severe, with psychotic features ([place])  Secondary Diagnoses: Principal Problem:   Bipolar affective disorder, currently manic, severe, with psychotic features ([place]) Active Problems:   Cannabis use disorder, moderate, dependence ([place])   Current Medications:  Current Facility-Administered Medications  Medication Dose Route Frequency Provider Last Rate Last Admin   acetaminophen (TYLENOL) tablet 650 mg  650 mg Oral Q6H PRN Clapacs, John T, MD       alum & mag hydroxide-simeth (MAALOX/MYLANTA) 200-200-20 MG/5ML suspension 30 mL  30 mL Oral Q4H PRN Clapacs, John T, MD       amLODipine (NORVASC) tablet 5 mg  5 mg Oral Daily Selina Cooley M, MD   5 mg at 05/04/21 0801   benztropine (COGENTIN) tablet 0.5 mg  0.5 mg Oral QHS Salley Scarlet, MD       hydrALAZINE (APRESOLINE) tablet 10 mg  10 mg Oral Q6H PRN Salley Scarlet, MD       hydrOXYzine (ATARAX/VISTARIL) tablet 50 mg  50 mg Oral Q6H PRN Clapacs, John T, MD   50 mg at 05/04/21 0803   magnesium hydroxide (MILK OF MAGNESIA) suspension 30 mL  30 mL Oral Daily PRN Clapacs, Madie Reno, MD       OLANZapine (ZYPREXA) tablet 10 mg  10 mg Oral Q6H PRN Salley Scarlet, MD       propranolol (INDERAL) tablet 10 mg  10 mg Oral BID Salley Scarlet, MD   10 mg at 05/04/21 1019   risperiDONE (RISPERDAL M-TABS) disintegrating tablet 2 mg  2 mg Oral QHS Salley Scarlet, MD   2 mg at 05/03/21 2201   traZODone (DESYREL) tablet 100 mg  100 mg Oral QHS PRN Salley Scarlet, MD   100 mg at 05/03/21 2204   ziprasidone (GEODON) injection 20 mg  20 mg Intramuscular Q6H PRN Salley Scarlet, MD       PTA Medications: Medications Prior to Admission  Medication Sig Dispense Refill Last Dose   citalopram (CELEXA) 20 MG tablet Take 20 mg by mouth every morning.       HYDROcodone-acetaminophen (NORCO/VICODIN) 5-325 MG tablet Take 1 tablet by mouth every 6 (six) hours as needed for severe pain. (Patient not taking: No sig reported) 10 tablet 0    hydrOXYzine (ATARAX/VISTARIL) 25 MG tablet Take 1 tablet (25 mg total) by mouth every 8 (eight) hours as needed for anxiety. (Patient not taking: Reported on 03/17/2019) 15 tablet 0    hydrOXYzine (ATARAX/VISTARIL) 25 MG tablet Take 1 tablet (25 mg total) by mouth every 6 (six) hours as needed for anxiety. (Patient not taking: No sig reported) 30 tablet 2    ondansetron (ZOFRAN ODT) 4 MG disintegrating tablet Take 1 tablet (4 mg total) by mouth every 8 (eight) hours as needed for nausea or vomiting. (Patient not taking: Reported on 03/26/2019) 20 tablet 0    ondansetron (ZOFRAN) 4 MG tablet Take 1 tablet (4 mg total) by mouth every 8 (eight) hours as needed for nausea or vomiting. (Patient not taking: Reported on 05/03/2021) 15 tablet 0    sertraline (ZOLOFT) 50 MG tablet Take 1 tablet (50 mg total) by mouth daily. (Patient not taking: Reported on 05/03/2021) 30 tablet 1    traZODone (DESYREL) 50 MG tablet Take 50 mg by mouth at bedtime.       Patient Stressors:  Patient Strengths:    Treatment Modalities: Medication Management, Group therapy, Case management,  1 to 1 session with clinician, Psychoeducation, Recreational therapy.   Physician Treatment Plan for Primary Diagnosis: Bipolar affective disorder, currently manic, severe, with psychotic features ([place]) Long Term Goal(s): Improvement in symptoms so as ready for discharge   Short Term Goals: Ability to identify changes in lifestyle to reduce recurrence of condition will improve Ability to verbalize feelings will improve Ability to disclose and discuss suicidal ideas Ability to demonstrate self-control will improve Ability to identify and develop effective coping behaviors will improve Ability to maintain clinical measurements within normal limits will  improve Compliance with prescribed medications will improve Ability to identify triggers associated with substance abuse/mental health issues will improve  Medication Management: Evaluate patient's response, side effects, and tolerance of medication regimen.  Therapeutic Interventions: 1 to 1 sessions, Unit Group sessions and Medication administration.  Evaluation of Outcomes: Not Met  Physician Treatment Plan for Secondary Diagnosis: Principal Problem:   Bipolar affective disorder, currently manic, severe, with psychotic features ([place]) Active Problems:   Cannabis use disorder, moderate, dependence ([place])  Long Term Goal(s): Improvement in symptoms so as ready for discharge   Short Term Goals: Ability to identify changes in lifestyle to reduce recurrence of condition will improve Ability to verbalize feelings will improve Ability to disclose and discuss suicidal ideas Ability to demonstrate self-control will improve Ability to identify and develop effective coping behaviors will improve Ability to maintain clinical measurements within normal limits will improve Compliance with prescribed medications will improve Ability to identify triggers associated with substance abuse/mental health issues will improve     Medication Management: Evaluate patient's response, side effects, and tolerance of medication regimen.  Therapeutic Interventions: 1 to 1 sessions, Unit Group sessions and Medication administration.  Evaluation of Outcomes: Not Met   RN Treatment Plan for Primary Diagnosis: Bipolar affective disorder, currently manic, severe, with psychotic features ([place]) Long Term Goal(s): Knowledge of disease and therapeutic regimen to maintain health will improve  Short Term Goals: Ability to remain free from injury will improve, Ability to verbalize frustration and anger appropriately will improve, Ability to demonstrate self-control, Ability to participate in decision making will improve,  Ability to verbalize feelings will improve, Ability to disclose and discuss suicidal ideas, Ability to identify and develop effective coping behaviors will improve, and Compliance with prescribed medications will improve  Medication Management: RN will administer medications as ordered by provider, will assess and evaluate patient's response and provide education to patient for prescribed medication. RN will report any adverse and/or side effects to prescribing provider.  Therapeutic Interventions: 1 on 1 counseling sessions, Psychoeducation, Medication administration, Evaluate responses to treatment, Monitor vital signs and CBGs as ordered, Perform/monitor CIWA, COWS, AIMS and Fall Risk screenings as ordered, Perform wound care treatments as ordered.  Evaluation of Outcomes: Not Met   LCSW Treatment Plan for Primary Diagnosis: Bipolar affective disorder, currently manic, severe, with psychotic features ([place]) Long Term Goal(s): Safe transition to appropriate next level of care at discharge, Engage patient in therapeutic group addressing interpersonal concerns.  Short Term Goals: Engage patient in aftercare planning with referrals and resources, Increase social support, Increase ability to appropriately verbalize feelings, Increase emotional regulation, Facilitate patient progression through stages of change regarding substance use diagnoses and concerns, and Identify triggers associated with mental health/substance abuse issues  Therapeutic Interventions: Assess for all discharge needs, 1 to 1 time with Social worker, Explore available resources and support systems, Assess for adequacy in  community support network, Educate family and significant other(s) on suicide prevention, Complete Psychosocial Assessment, Interpersonal group therapy.  Evaluation of Outcomes: Not Met   Progress in Treatment: Attending groups: No. Participating in groups: No. Taking medication as prescribed: Yes. Toleration  medication: Yes. Family/Significant other contact made: No, will contact:  when given permission. Patient understands diagnosis: No. Discussing patient identified problems/goals with staff: Yes. Medical problems stabilized or resolved: Yes. Denies suicidal/homicidal ideation: Yes. Issues/concerns per patient self-inventory: No. Other: none.  New problem(s) identified: No, Describe:  none.  New Short Term/Long Term Goal(s): detox, elimination of symptoms of psychosis, medication management for mood stabilization; elimination of SI thoughts; development of comprehensive mental wellness/sobriety plan.  Patient Goals: "Just my medicine."   Discharge Plan or Barriers: CSW will assist with development of an appropriate aftercare/discharge plan.  Reason for Continuation of Hospitalization: Hallucinations Medication stabilization Suicidal ideation Withdrawal symptoms  Estimated Length of Stay: 1-7 days   Scribe for Treatment Team: Shirl Harris, LCSW 05/04/2021 10:37 AM

## 2021-05-04 NOTE — Progress Notes (Signed)
Patient continues to endorse AH "the voices are telling me I am worth nothing" Patient is anxious. Denies SI/HI/VH and verbally contracted for safety. Prn trazodone for sleep and vistaril for anxiety given at hs and effective. Support and encouragement provided.

## 2021-05-04 NOTE — Progress Notes (Signed)
Recreation Therapy Notes   Date: 05/04/2021  Time: 10:15am  Location: Craft room   Behavioral response: N/A   Intervention Topic: Problem Solving   Discussion/Intervention: Patient did not attend group.   Clinical Observations/Feedback:  Patient did not attend group.   Camrie Stock LRT/CTRS         [place] 05/04/2021 12:14 PM

## 2021-05-05 DIAGNOSIS — F312 Bipolar disorder, current episode manic severe with psychotic features: Secondary | ICD-10-CM | POA: Diagnosis not present

## 2021-05-05 LAB — HEMOGLOBIN A1C
Hgb A1c MFr Bld: 5 % (ref 4.8–5.6)
Mean Plasma Glucose: 96.8 mg/dL

## 2021-05-05 LAB — LIPID PANEL
Cholesterol: 202 mg/dL — ABNORMAL HIGH (ref 0–200)
HDL: 35 mg/dL — ABNORMAL LOW (ref >40)
LDL Cholesterol: 150 mg/dL — ABNORMAL HIGH (ref 0–99)
Total CHOL/HDL Ratio: 5.8 RATIO
Triglycerides: 83 mg/dL (ref <150)
VLDL: 17 mg/dL (ref 0–40)

## 2021-05-05 MED ORDER — ENSURE ENLIVE PO LIQD
237.0000 mL | Freq: Three times a day (TID) | ORAL | Status: DC
  Administered 2021-05-05 – 2021-05-30 (×73): 237 mL via ORAL

## 2021-05-05 MED ORDER — RISPERIDONE 1 MG PO TABS
3.0000 mg | ORAL_TABLET | Freq: Every day | ORAL | Status: DC
  Administered 2021-05-05: 3 mg via ORAL
  Filled 2021-05-05: qty 3

## 2021-05-05 MED ORDER — PROPRANOLOL HCL 20 MG PO TABS
20.0000 mg | ORAL_TABLET | Freq: Two times a day (BID) | ORAL | Status: DC
  Administered 2021-05-05 – 2021-05-30 (×49): 20 mg via ORAL
  Filled 2021-05-05 (×50): qty 1

## 2021-05-05 MED ORDER — RISPERIDONE 1 MG PO TBDP
3.0000 mg | ORAL_TABLET | Freq: Every day | ORAL | Status: DC
  Filled 2021-05-05: qty 3

## 2021-05-05 NOTE — Progress Notes (Signed)
[facility] MD Progress Note  05/05/2021 10:22 AM Nicholas Nielsen  MRN:  [id number]  CC "Slept a little bit."  Subjective:  40 year old male presenting for worsening psychosis, agitation, and poor self care. No acute events overnight, medication compliant, ADLs impaired. Patient seen one-on-one today. He remains visibly anxious and tremulous. He does feel that his heart flutters are somewhat better. He denies suicidal ideations today though continues to endorse command auditory hallucinations telling him to kill himself. Also continues to endorse paranoia of being watched and followed. Also feels he can read peoples minds when he is close enough to person's body. Denies HI. He is agreeable to increase in Risperdal and Propranolol today.  Principal Problem: Bipolar affective disorder, currently manic, severe, with psychotic features (HCC) Diagnosis: Principal Problem:   Bipolar affective disorder, currently manic, severe, with psychotic features (HCC) Active Problems:   Cannabis use disorder, moderate, dependence (HCC)  Total Time spent with patient: 30 minutes  Past Psychiatric History: See H&P  Past Medical History:  Past Medical History:  Diagnosis Date   Back pain    Bipolar 1 disorder (HCC)    Hepatitis C    Kidney stones    Schizo affective schizophrenia (HCC)    Testicular cyst    History reviewed. No pertinent surgical history. Family History:  Family History  Problem Relation Age of Onset   Stroke Father    Hypertension Father    Family Psychiatric  History: See H&P Social History:  Social History   Substance and Sexual Activity  Alcohol Use Not Currently     Social History   Substance and Sexual Activity  Drug Use Yes   Frequency: 7.0 times per week   Types: Marijuana   Comment: Daily use; last use is 03/26/2019    Social History   Socioeconomic History   Marital status: Single    Spouse name: Not on file   Number of children: Not on file   Years of education:  Not on file   Highest education level: Not on file  Occupational History   Occupation: Unemployed  Tobacco Use   Smoking status: Every Day    Packs/day: 1.00    Years: 15.00    Pack years: 15.00    Types: Cigarettes   Smokeless tobacco: Never  Vaping Use   Vaping Use: Never used  Substance and Sexual Activity   Alcohol use: Not Currently   Drug use: Yes    Frequency: 7.0 times per week    Types: Marijuana    Comment: Daily use; last use is 03/26/2019   Sexual activity: Never    Birth control/protection: Condom  Other Topics Concern   Not on file  Social History Narrative   Pt stated that he is currently living with his brother.  He lives in [place] and is unemployed.  Pt stated that he recently arranged an appointment with [facility].   Social Determinants of Health   Financial Resource Strain: Not on file  Food Insecurity: Not on file  Transportation Needs: Not on file  Physical Activity: Not on file  Stress: Not on file  Social Connections: Not on file   Additional Social History:                         Sleep: Poor  Appetite:  Poor  Current Medications: Current Facility-Administered Medications  Medication Dose Route Frequency Provider Last Rate Last Admin   acetaminophen (TYLENOL) tablet 650 mg  650 mg Oral  Q6H PRN Clapacs, Jackquline Denmark, MD       alum & mag hydroxide-simeth (MAALOX/MYLANTA) 200-200-20 MG/5ML suspension 30 mL  30 mL Oral Q4H PRN Clapacs, Jackquline Denmark, MD       amLODipine (NORVASC) tablet 5 mg  5 mg Oral Daily Jesse Sans, MD   5 mg at 05/05/21 0810   benztropine (COGENTIN) tablet 0.5 mg  0.5 mg Oral QHS Jesse Sans, MD   0.5 mg at 05/04/21 2054   feeding supplement (ENSURE ENLIVE / ENSURE PLUS) liquid 237 mL  237 mL Oral TID BM Jesse Sans, MD   237 mL at 05/05/21 1021   hydrALAZINE (APRESOLINE) tablet 10 mg  10 mg Oral Q6H PRN Jesse Sans, MD       hydrOXYzine (ATARAX/VISTARIL) tablet 50 mg  50 mg Oral Q6H PRN Clapacs, Jackquline Denmark,  MD   50 mg at 05/05/21 6599   magnesium hydroxide (MILK OF MAGNESIA) suspension 30 mL  30 mL Oral Daily PRN Clapacs, Jackquline Denmark, MD       OLANZapine (ZYPREXA) tablet 10 mg  10 mg Oral Q6H PRN Jesse Sans, MD       propranolol (INDERAL) tablet 10 mg  10 mg Oral BID Jesse Sans, MD   10 mg at 05/05/21 0810   risperiDONE (RISPERDAL M-TABS) disintegrating tablet 3 mg  3 mg Oral QHS Jesse Sans, MD       traZODone (DESYREL) tablet 100 mg  100 mg Oral QHS PRN Jesse Sans, MD   100 mg at 05/04/21 2055   ziprasidone (GEODON) injection 20 mg  20 mg Intramuscular Q6H PRN Jesse Sans, MD        Lab Results:  Results for orders placed or performed during the hospital encounter of 05/03/21 (from the past 48 hour(s))  Lipid panel     Status: Abnormal   Collection Time: 05/05/21  8:41 AM  Result Value Ref Range   Cholesterol 202 (H) 0 - 200 mg/dL   Triglycerides 83 <357 mg/dL   HDL 35 (L) >01 mg/dL   Total CHOL/HDL Ratio 5.8 RATIO   VLDL 17 0 - 40 mg/dL   LDL Cholesterol 779 (H) 0 - 99 mg/dL    Comment:        Total Cholesterol/HDL:CHD Risk Coronary Heart Disease Risk Table                     Men   Women  1/2 Average Risk   3.4   3.3  Average Risk       5.0   4.4  2 X Average Risk   9.6   7.1  3 X Average Risk  23.4   11.0        Use the calculated Patient Ratio above and the CHD Risk Table to determine the patient's CHD Risk.        ATP III CLASSIFICATION (LDL):  <100     mg/dL   Optimal  390-300  mg/dL   Near or Above                    Optimal  130-159  mg/dL   Borderline  923-300  mg/dL   High  >762     mg/dL   Very High Performed at [facility], [address] [place], [place] [address]     Blood Alcohol level:  Lab Results  Component Value Date   ETH <10  03/26/2019   ETH <11 10/11/2011    Metabolic Disorder Labs: No results found for: HGBA1C, MPG No results found for: PROLACTIN Lab Results  Component Value Date   CHOL 202 (H) 05/05/2021    TRIG 83 05/05/2021   HDL 35 (L) 05/05/2021   CHOLHDL 5.8 05/05/2021   VLDL 17 05/05/2021   LDLCALC 150 (H) 05/05/2021    Physical Findings: AIMS:  , ,  ,  ,    CIWA:    COWS:     Musculoskeletal: Strength & Muscle Tone: within normal limits Gait & Station: normal Patient leans: N/A  Psychiatric Specialty Exam:  Presentation  General Appearance: Appropriate for Environment  Eye Contact:Good  Speech:Pressured  Speech Volume:Increased  Handedness:Right   Mood and Affect  Mood:Anxious  Affect:Congruent   Thought Process  Thought Processes:Disorganized  Descriptions of Associations:Tangential  Orientation:Full (Time, Place and Person)  Thought Content:Paranoid Ideation; Delusions  History of Schizophrenia/Schizoaffective disorder:Yes  Duration of Psychotic Symptoms:Greater than six months  Hallucinations:Hallucinations: Auditory; Visual  Ideas of Reference:Delusions; Paranoia  Suicidal Thoughts:Suicidal Thoughts: Yes, Active  Homicidal Thoughts:Homicidal Thoughts: No   Sensorium  Memory:Immediate Fair; Recent Fair; Remote Fair  Judgment:Poor  Insight:Shallow   Executive Functions  Concentration:Fair  Attention Span:Fair  Recall:Fair  Fund of Knowledge:Fair  Language:Fair   Psychomotor Activity  Psychomotor Activity:Psychomotor Activity: Restlessness   Assets  Assets:Communication Skills; Desire for Improvement; Housing; Social Support   Sleep  Sleep:Sleep: Good Number of Hours of Sleep: 9    Physical Exam: Physical Exam ROS Blood pressure (!) 118/99, pulse (!) 109, temperature 97.7 F (36.5 C), temperature source Oral, resp. rate 17, height 6\' 1"  (1.854 m), weight 67.6 kg, SpO2 95 %. Body mass index is 19.66 kg/m.   Treatment Plan Summary: Daily contact with patient to assess and evaluate symptoms and progress in treatment and Medication management 40 year old male presenting for worsening psychosis, agitation, and  poor self care. Endorsing AH/VHtoday, denies SI/HI. Appears visibly jittery and anxious. Increase Risperdal 3 mg QHS, continue cogentin 0.5 mg QHS for EPS prophylaxis. Increase propranolol 20 mg BID for tachycardia and anxiety, titrate as needed. Hydroxyzine PRN for anxiety. Norvasc 5 mg daily for HTN, titrate as needed. PRNs in place for agitation though calm and cooperative thus far. Lipid panel with slightly elevated LDL and total cholesterol. Dietary counseling. Hemoglobin a1c pending. Cannabis cessation counseling.   43, MD 05/05/2021, 10:22 AM

## 2021-05-05 NOTE — Progress Notes (Signed)
Recreation Therapy Notes  INPATIENT RECREATION THERAPY ASSESSMENT  Patient Details Name: JILLIAN PIANKA MRN: [id number] DOB: [birth year] Today's Date: 05/05/2021       Information Obtained From: Patient  Able to Participate in Assessment/Interview: Yes  Patient Presentation: Responsive  Reason for Admission (Per Patient): Active Symptoms, Suicidal Ideation  Patient Stressors:    Coping Skills:   Film/video editor, Avoidance  Leisure Interests (2+):  Nature - [employer], Technical brewer - Vegetable gardening, Social - Family, Exercise - Jogging, Exercise - Paediatric nurse  Frequency of Recreation/Participation: Other (Comment) (Past)  Awareness of Community Resources:  Yes  Community Resources:  Gym  Current Use: No  If no, Barriers?: Transport planner, English as a second language teacher, Surveyor, quantity  Expressed Interest in [employer] Information: No  [employer] of Residence:  [place]  Patient Main Form of Transportation: Therapist, music  Patient Strengths:  N/A  Patient Identified Areas of Improvement:  Get medication right  Patient Goal for Hospitalization:  My medicine  Current SI (including self-harm):  No  Current HI:  No  Current AVH: No  Staff Intervention Plan: Group Attendance, Collaborate with Interdisciplinary Treatment Team  Consent to Intern Participation: N/A  Heyden Jaber 05/05/2021, 3:25 PM

## 2021-05-05 NOTE — Progress Notes (Signed)
Recreation Therapy Notes   Date: 05/05/2021   Time: 10:00 am    Location: Craft room  Behavioral response: N/A   Intervention Topic: Leisure    Discussion/Intervention: Patient did not attend group.   Clinical Observations/Feedback:  Patient did not attend group.   Stormee Duda LRT/CTRS        Zyriah Mask 05/05/2021 11:31 AM

## 2021-05-05 NOTE — Progress Notes (Signed)
Recreation Therapy Notes  INPATIENT RECREATION TR PLAN  Patient Details Name: Nicholas Nielsen MRN: [id number] DOB: 08-20-1980 Today's Date: 05/05/2021  Rec Therapy Plan Is patient appropriate for Therapeutic Recreation?: Yes Treatment times per week: at least 3 Estimated Length of Stay: 5-7dyas TR Treatment/Interventions: Group participation (Comment)  Discharge Criteria Pt will be discharged from therapy if:: Discharged Treatment plan/goals/alternatives discussed and agreed upon by:: Patient/family  Discharge Summary     [name] 05/05/2021, 3:26 PM

## 2021-05-05 NOTE — Progress Notes (Signed)
   05/04/21 2000  Psych Admission Type (Psych Patients Only)  Admission Status Involuntary  Psychosocial Assessment  Patient Complaints Insomnia;Shakiness;Suspiciousness  Eye Contact Poor  Facial Expression Worried  Affect Fearful  Speech Slow  Interaction Poor;Seclusive  Motor Activity Slow  Appearance/Hygiene Disheveled  Behavior Characteristics Anxious;Restless  Mood Suspicious  Aggressive Behavior  Effect No apparent injury  Thought Process  Coherency Concrete thinking;Disorganized  Content Blaming others;Magical thinking;Paranoia  Delusions Persecutory;Paranoid;Somatic  Perception Hallucinations  Hallucination Auditory;Visual  Judgment Poor  Confusion WDL  Danger to Self  Current suicidal ideation? Denies  Danger to Others  Danger to Others None reported or observed

## 2021-05-05 NOTE — Group Note (Signed)
[facility] LCSW Group Therapy Note   Group Date: 05/05/2021 Start Time: 1300 End Time: 1340   Type of Therapy/Topic:  Group Therapy:  Balance in Life  Participation Level:  Did Not Attend   Description of Group:    This group will address the concept of balance and how it feels and looks when one is unbalanced. Patients will be encouraged to process areas in their lives that are out of balance, and identify reasons for remaining unbalanced. Facilitators will guide patients utilizing problem- solving interventions to address and correct the stressor making their life unbalanced. Understanding and applying boundaries will be explored and addressed for obtaining  and maintaining a balanced life. Patients will be encouraged to explore ways to assertively make their unbalanced needs known to significant others in their lives, using other group members and facilitator for support and feedback.  Therapeutic Goals: Patient will identify two or more emotions or situations they have that consume much of in their lives. Patient will identify signs/triggers that life has become out of balance:  Patient will identify two ways to set boundaries in order to achieve balance in their lives:  Patient will demonstrate ability to communicate their needs through discussion and/or role plays  Summary of Patient Progress: X  Therapeutic Modalities:   Cognitive Behavioral Therapy Solution-Focused Therapy Assertiveness Training   Dezmin Kittelson R Mandolin Falwell, LCSW 

## 2021-05-06 DIAGNOSIS — F312 Bipolar disorder, current episode manic severe with psychotic features: Secondary | ICD-10-CM | POA: Diagnosis not present

## 2021-05-06 MED ORDER — RISPERIDONE 1 MG PO TABS
4.0000 mg | ORAL_TABLET | Freq: Every day | ORAL | Status: DC
  Administered 2021-05-06 – 2021-05-07 (×2): 4 mg via ORAL
  Filled 2021-05-06 (×2): qty 4

## 2021-05-06 NOTE — Plan of Care (Signed)
  Problem: Education: Goal: Emotional status will improve Outcome: Not Progressing Goal: Mental status will improve Outcome: Not Progressing   Problem: Activity: Goal: Interest or engagement in activities will improve Outcome: Not Progressing   

## 2021-05-06 NOTE — Plan of Care (Signed)
Patient denies SI/HI/Avh and contracts for safety.  Patient continues to isolate on unit during shift. Patient did comply with scheduled medications and denies any adverse reactions from medications given.  Emotional support given and pt was educated on importance of compliance with treatment plan.   Q 15 minute safety checks will continue.  Pt informed to notify nursing staff if any needs arise.    Problem: Education: Goal: Knowledge of [facility] Education information/materials will improve Outcome: Progressing Goal: Emotional status will improve Outcome: Progressing Goal: Mental status will improve Outcome: Progressing

## 2021-05-06 NOTE — Group Note (Signed)
[facility] LCSW Group Therapy Note   Group Date: 05/06/2021 Start Time: 1300 End Time: 1400  Type of Therapy and Topic:  Group Therapy:  Feelings around Relapse and Recovery  Participation Level:  Did Not Attend   Mood:  Description of Group:    Patients in this group will discuss emotions they experience before and after a relapse. They will process how experiencing these feelings, or avoidance of experiencing them, relates to having a relapse. Facilitator will guide patients to explore emotions they have related to recovery. Patients will be encouraged to process which emotions are more powerful. They will be guided to discuss the emotional reaction significant others in their lives may have to patients' relapse or recovery. Patients will be assisted in exploring ways to respond to the emotions of others without this contributing to a relapse.  Therapeutic Goals: Patient will identify two or more emotions that lead to relapse for them:  Patient will identify two emotions that result when they relapse:  Patient will identify two emotions related to recovery:  Patient will demonstrate ability to communicate their needs through discussion and/or role plays.   Summary of Patient Progress: Patient did not attend group despite encouraged participation.    Therapeutic Modalities:   Cognitive Behavioral Therapy Solution-Focused Therapy Assertiveness Training Relapse Prevention Therapy   Corky Crafts, [place]

## 2021-05-06 NOTE — Progress Notes (Signed)
Patient isolative to self and room. Denies SI, HI, endorses AVH. Minimal conversation with staff. Answers yes or no questions. Medication compliant.  Encouragement and support provided. Safety checks maintained. Medications given as prescribed. Pt receptive and remains safe on unit with q 15 min checks.

## 2021-05-06 NOTE — Progress Notes (Signed)
[facility] MD Progress Note  05/06/2021 9:28 AM Nicholas Nielsen  MRN:  [id number]  CC "Didn't have any nightmares."  Subjective:  40 year old male presenting for worsening psychosis, agitation, and poor self care. No acute events overnight, medication compliant, ADLs impaired. Patient seen one-on-one this morning. Subjectively still looks very paranoid, anxious, and jittery on exam. However, he is reporting feeling less anxious and notes he is no longer having nightmares. He continues to have auditory hallucinations that are derogatory, but feels they are less constant. Continues to have passive SI. Denies HI/VH, denies medication side effects.  Principal Problem: Bipolar affective disorder, currently manic, severe, with psychotic features (HCC) Diagnosis: Principal Problem:   Bipolar affective disorder, currently manic, severe, with psychotic features (HCC) Active Problems:   Cannabis use disorder, moderate, dependence (HCC)  Total Time spent with patient: 30 minutes  Past Psychiatric History: See H&P  Past Medical History:  Past Medical History:  Diagnosis Date   Back pain    Bipolar 1 disorder (HCC)    Hepatitis C    Kidney stones    Schizo affective schizophrenia (HCC)    Testicular cyst    History reviewed. No pertinent surgical history. Family History:  Family History  Problem Relation Age of Onset   Stroke Father    Hypertension Father    Family Psychiatric  History: See H&P Social History:  Social History   Substance and Sexual Activity  Alcohol Use Not Currently     Social History   Substance and Sexual Activity  Drug Use Yes   Frequency: 7.0 times per week   Types: Marijuana   Comment: Daily use; last use is 03/26/2019    Social History   Socioeconomic History   Marital status: Single    Spouse name: Not on file   Number of children: Not on file   Years of education: Not on file   Highest education level: Not on file  Occupational History   Occupation:  Unemployed  Tobacco Use   Smoking status: Every Day    Packs/day: 1.00    Years: 15.00    Pack years: 15.00    Types: Cigarettes   Smokeless tobacco: Never  Vaping Use   Vaping Use: Never used  Substance and Sexual Activity   Alcohol use: Not Currently   Drug use: Yes    Frequency: 7.0 times per week    Types: Marijuana    Comment: Daily use; last use is 03/26/2019   Sexual activity: Never    Birth control/protection: Condom  Other Topics Concern   Not on file  Social History Narrative   Pt stated that he is currently living with his brother.  He lives in [place] and is unemployed.  Pt stated that he recently arranged an appointment with [facility].   Social Determinants of Health   Financial Resource Strain: Not on file  Food Insecurity: Not on file  Transportation Needs: Not on file  Physical Activity: Not on file  Stress: Not on file  Social Connections: Not on file   Additional Social History:                         Sleep: Poor  Appetite:  Poor  Current Medications: Current Facility-Administered Medications  Medication Dose Route Frequency Provider Last Rate Last Admin   acetaminophen (TYLENOL) tablet 650 mg  650 mg Oral Q6H PRN Clapacs, Jackquline Denmark, MD   650 mg at 05/06/21 0741   alum & mag  hydroxide-simeth (MAALOX/MYLANTA) 200-200-20 MG/5ML suspension 30 mL  30 mL Oral Q4H PRN Clapacs, Jackquline Denmark, MD       amLODipine (NORVASC) tablet 5 mg  5 mg Oral Daily Jesse Sans, MD   5 mg at 05/06/21 0738   benztropine (COGENTIN) tablet 0.5 mg  0.5 mg Oral QHS Jesse Sans, MD   0.5 mg at 05/05/21 2048   feeding supplement (ENSURE ENLIVE / ENSURE PLUS) liquid 237 mL  237 mL Oral TID BM Jesse Sans, MD   237 mL at 05/05/21 2049   hydrALAZINE (APRESOLINE) tablet 10 mg  10 mg Oral Q6H PRN Jesse Sans, MD   10 mg at 05/05/21 2048   hydrOXYzine (ATARAX/VISTARIL) tablet 50 mg  50 mg Oral Q6H PRN Clapacs, Jackquline Denmark, MD   50 mg at 05/06/21 0741   magnesium  hydroxide (MILK OF MAGNESIA) suspension 30 mL  30 mL Oral Daily PRN Clapacs, Jackquline Denmark, MD       OLANZapine (ZYPREXA) tablet 10 mg  10 mg Oral Q6H PRN Jesse Sans, MD       propranolol (INDERAL) tablet 20 mg  20 mg Oral BID Jesse Sans, MD   20 mg at 05/06/21 0738   risperiDONE (RISPERDAL) tablet 4 mg  4 mg Oral QHS Jesse Sans, MD       traZODone (DESYREL) tablet 100 mg  100 mg Oral QHS PRN Jesse Sans, MD   100 mg at 05/05/21 2048   ziprasidone (GEODON) injection 20 mg  20 mg Intramuscular Q6H PRN Jesse Sans, MD        Lab Results:  Results for orders placed or performed during the hospital encounter of 05/03/21 (from the past 48 hour(s))  Lipid panel     Status: Abnormal   Collection Time: 05/05/21  8:41 AM  Result Value Ref Range   Cholesterol 202 (H) 0 - 200 mg/dL   Triglycerides 83 <161 mg/dL   HDL 35 (L) >09 mg/dL   Total CHOL/HDL Ratio 5.8 RATIO   VLDL 17 0 - 40 mg/dL   LDL Cholesterol 604 (H) 0 - 99 mg/dL    Comment:        Total Cholesterol/HDL:CHD Risk Coronary Heart Disease Risk Table                     Men   Women  1/2 Average Risk   3.4   3.3  Average Risk       5.0   4.4  2 X Average Risk   9.6   7.1  3 X Average Risk  23.4   11.0        Use the calculated Patient Ratio above and the CHD Risk Table to determine the patient's CHD Risk.        ATP III CLASSIFICATION (LDL):  <100     mg/dL   Optimal  540-981  mg/dL   Near or Above                    Optimal  130-159  mg/dL   Borderline  191-478  mg/dL   High  >295     mg/dL   Very High Performed at [facility], [address] [place], [place] [address]   Hemoglobin A1c     Status: None   Collection Time: 05/05/21  8:41 AM  Result Value Ref Range   Hgb A1c MFr Bld 5.0 4.8 -  5.6 %    Comment: (NOTE) Pre diabetes:          5.7%-6.4%  Diabetes:              >6.4%  Glycemic control for   <7.0% adults with diabetes    Mean Plasma Glucose 96.8 mg/dL    Comment: Performed  at [facility], [address] [place], [place] [address]    Blood Alcohol level:  Lab Results  Component Value Date   [facility] <10 03/26/2019   ETH <11 10/11/2011    Metabolic Disorder Labs: Lab Results  Component Value Date   HGBA1C 5.0 05/05/2021   MPG 96.8 05/05/2021   No results found for: PROLACTIN Lab Results  Component Value Date   CHOL 202 (H) 05/05/2021   TRIG 83 05/05/2021   HDL 35 (L) 05/05/2021   CHOLHDL 5.8 05/05/2021   VLDL 17 05/05/2021   LDLCALC 150 (H) 05/05/2021    Physical Findings: AIMS:  , ,  ,  ,    CIWA:    COWS:     Musculoskeletal: Strength & Muscle Tone: within normal limits Gait & Station: normal Patient leans: N/A  Psychiatric Specialty Exam:  Presentation  General Appearance: Appropriate for Environment  Eye Contact:Good  Speech:Normal rate Speech Volume:Normal Handedness:Right   Mood and Affect  Mood:Anxious  Affect:Congruent   Thought Process  Thought Processes:Linear Descriptions of Associations:Goal directed Orientation:Full (Time, Place and Person)  Thought Content:Paranoid Ideation; Delusions  History of Schizophrenia/Schizoaffective disorder:Yes  Duration of Psychotic Symptoms:Greater than six months  Hallucinations:Auditory  Ideas of Reference:Delusions; Paranoia  Suicidal Thoughts:Passive SI  Homicidal Thoughts:Denies   Sensorium  Memory:Immediate Fair; Recent Fair; Remote Fair  Judgment:Poor  Insight:Shallow   Executive Functions  Concentration:Fair  Attention Span:Fair  Recall:Fair  Fund of Knowledge:Fair  Language:Fair   Psychomotor Activity  Psychomotor Activity:Restless   Assets  Assets:Communication Skills; Desire for Improvement; Housing; Social Support   Sleep  Sleep:Good, 8.25    Physical Exam: Physical Exam ROS Blood pressure (!) 130/92, pulse (!) 106, temperature 97.7 F (36.5 C), temperature source Oral, resp. rate 17, height 6\' 1"  (1.854 m), weight  67.6 kg, SpO2 97 %. Body mass index is 19.66 kg/m.   Treatment Plan Summary: Daily contact with patient to assess and evaluate symptoms and progress in treatment and Medication management 40 year old male presenting for worsening psychosis, agitation, and poor self care. Endorsing AH along with passive SI, denies VH/HI. Appears visibly jittery and anxious. Increase Risperdal 4 mg QHS, continue cogentin 0.5 mg QHS for EPS prophylaxis. Continue propranolol 20 mg BID for tachycardia and anxiety, titrate as needed. Hydroxyzine PRN for anxiety. Norvasc 5 mg daily for HTN, titrate as needed. PRNs in place for agitation though calm and cooperative thus far. Lipid panel with slightly elevated LDL and total cholesterol. Dietary counseling. Hemoglobin a1c 5.0. Cannabis cessation counseling.   43, MD 05/06/2021, 9:28 AM

## 2021-05-06 NOTE — Progress Notes (Signed)
Recreation Therapy Notes   Date: 05/06/2021   Time: 9:45am    Location: Craft room  Behavioral response: N/A   Intervention Topic: Creative expression   Discussion/Intervention: Patient did not attend group.   Clinical Observations/Feedback:  Patient did not attend group.   Lennyn Bellanca LRT/CTRS        Syanne Looney 05/06/2021 11:25 AM 

## 2021-05-07 DIAGNOSIS — F312 Bipolar disorder, current episode manic severe with psychotic features: Secondary | ICD-10-CM | POA: Diagnosis not present

## 2021-05-07 NOTE — Plan of Care (Signed)
Met with Nicholas Nielsen in Nicholas Nielsen room. Denies SI / HI / AVH. Reports increasing anxiety. Denies Pain. No signs of immediate distress or injury. Nicholas Nielsen is ambulating in room without assistance. Nicholas Nielsen vitals this AM were WNL. Nicholas Nielsen is adherent with scheduled medications. Nicholas Nielsen was open about recent thoughts of suicide leading to current admission. Nicholas Nielsen agreeable to safety standards on the unit. Staff will continue to monitor in milieu.    Problem: Education: Goal: Knowledge of [facility] Education information/materials will improve Outcome: Not Progressing Goal: Emotional status will improve Outcome: Not Progressing Goal: Mental status will improve Outcome: Not Progressing

## 2021-05-07 NOTE — Progress Notes (Signed)
[facility] MD Progress Note  05/07/2021 10:00 AM DAMEK ENDE  MRN:  [id number]  CC "Little better"  Subjective:  40 year old male presenting for worsening psychosis, agitation, and poor self care. No acute events overnight, medication compliant, ADLs impaired. Patient seen one-on-one this morning. He reports that he is still having auditory hallucinations but they are more intermittent now. He denies VH/SI/HI. He feels the medications are beginning to help. He continues to report feeling less anxious as well although objectively still appears anxious and guarded.  Principal Problem: Bipolar affective disorder, currently manic, severe, with psychotic features (HCC) Diagnosis: Principal Problem:   Bipolar affective disorder, currently manic, severe, with psychotic features (HCC) Active Problems:   Cannabis use disorder, moderate, dependence (HCC)  Total Time spent with patient: 30 minutes  Past Psychiatric History: See H&P  Past Medical History:  Past Medical History:  Diagnosis Date   Back pain    Bipolar 1 disorder (HCC)    Hepatitis C    Kidney stones    Schizo affective schizophrenia (HCC)    Testicular cyst    History reviewed. No pertinent surgical history. Family History:  Family History  Problem Relation Age of Onset   Stroke Father    Hypertension Father    Family Psychiatric  History: See H&P Social History:  Social History   Substance and Sexual Activity  Alcohol Use Not Currently     Social History   Substance and Sexual Activity  Drug Use Yes   Frequency: 7.0 times per week   Types: Marijuana   Comment: Daily use; last use is 03/26/2019    Social History   Socioeconomic History   Marital status: Single    Spouse name: Not on file   Number of children: Not on file   Years of education: Not on file   Highest education level: Not on file  Occupational History   Occupation: Unemployed  Tobacco Use   Smoking status: Every Day    Packs/day: 1.00     Years: 15.00    Pack years: 15.00    Types: Cigarettes   Smokeless tobacco: Never  Vaping Use   Vaping Use: Never used  Substance and Sexual Activity   Alcohol use: Not Currently   Drug use: Yes    Frequency: 7.0 times per week    Types: Marijuana    Comment: Daily use; last use is 03/26/2019   Sexual activity: Never    Birth control/protection: Condom  Other Topics Concern   Not on file  Social History Narrative   Pt stated that he is currently living with his brother.  He lives in [place] and is unemployed.  Pt stated that he recently arranged an appointment with [facility].   Social Determinants of Health   Financial Resource Strain: Not on file  Food Insecurity: Not on file  Transportation Needs: Not on file  Physical Activity: Not on file  Stress: Not on file  Social Connections: Not on file   Additional Social History:                         Sleep: Fair  Appetite:  Poor  Current Medications: Current Facility-Administered Medications  Medication Dose Route Frequency Provider Last Rate Last Admin   acetaminophen (TYLENOL) tablet 650 mg  650 mg Oral Q6H PRN Clapacs, Jackquline Denmark, MD   650 mg at 05/06/21 0741   alum & mag hydroxide-simeth (MAALOX/MYLANTA) 200-200-20 MG/5ML suspension 30 mL  30 mL Oral  Q4H PRN Clapacs, Jackquline Denmark, MD       amLODipine (NORVASC) tablet 5 mg  5 mg Oral Daily Les Pou M, MD   5 mg at 05/07/21 0802   benztropine (COGENTIN) tablet 0.5 mg  0.5 mg Oral QHS Jesse Sans, MD   0.5 mg at 05/06/21 2211   feeding supplement (ENSURE ENLIVE / ENSURE PLUS) liquid 237 mL  237 mL Oral TID BM Jesse Sans, MD   237 mL at 05/06/21 2100   hydrALAZINE (APRESOLINE) tablet 10 mg  10 mg Oral Q6H PRN Jesse Sans, MD   10 mg at 05/05/21 2048   hydrOXYzine (ATARAX/VISTARIL) tablet 50 mg  50 mg Oral Q6H PRN Clapacs, John T, MD   50 mg at 05/07/21 0802   magnesium hydroxide (MILK OF MAGNESIA) suspension 30 mL  30 mL Oral Daily PRN Clapacs, Jackquline Denmark,  MD       OLANZapine (ZYPREXA) tablet 10 mg  10 mg Oral Q6H PRN Jesse Sans, MD       propranolol (INDERAL) tablet 20 mg  20 mg Oral BID Jesse Sans, MD   20 mg at 05/07/21 0802   risperiDONE (RISPERDAL) tablet 4 mg  4 mg Oral QHS Jesse Sans, MD   4 mg at 05/06/21 2210   traZODone (DESYREL) tablet 100 mg  100 mg Oral QHS PRN Jesse Sans, MD   100 mg at 05/06/21 2211   ziprasidone (GEODON) injection 20 mg  20 mg Intramuscular Q6H PRN Jesse Sans, MD        Lab Results:  No results found for this or any previous visit (from the past 48 hour(s)).   Blood Alcohol level:  Lab Results  Component Value Date   ETH <10 03/26/2019   ETH <11 10/11/2011    Metabolic Disorder Labs: Lab Results  Component Value Date   HGBA1C 5.0 05/05/2021   MPG 96.8 05/05/2021   No results found for: PROLACTIN Lab Results  Component Value Date   CHOL 202 (H) 05/05/2021   TRIG 83 05/05/2021   HDL 35 (L) 05/05/2021   CHOLHDL 5.8 05/05/2021   VLDL 17 05/05/2021   LDLCALC 150 (H) 05/05/2021    Physical Findings: AIMS:  , ,  ,  ,    CIWA:    COWS:     Musculoskeletal: Strength & Muscle Tone: within normal limits Gait & Station: normal Patient leans: N/A  Psychiatric Specialty Exam:  Presentation  General Appearance: Appropriate for Environment  Eye Contact:Good  Speech:Normal rate Speech Volume:Normal Handedness:Right   Mood and Affect  Mood:Anxious  Affect:Congruent   Thought Process  Thought Processes:Linear Descriptions of Associations:Goal directed Orientation:Full (Time, Place and Person)  Thought Content:Paranoid Ideation; Delusions  History of Schizophrenia/Schizoaffective disorder:Yes  Duration of Psychotic Symptoms:Greater than six months  Hallucinations:Auditory  Ideas of Reference:Delusions; Paranoia  Suicidal Thoughts:Denies  Homicidal Thoughts:Denies   Sensorium  Memory:Immediate Fair; Recent Fair; Remote  Fair  Judgment:Poor  Insight:Shallow   Executive Functions  Concentration:Fair  Attention Span:Fair  Recall:Fair  Fund of Knowledge:Fair  Language:Fair   Psychomotor Activity  Psychomotor Activity:Restless   Assets  Assets:Communication Skills; Desire for Improvement; Housing; Social Support   Sleep  Sleep:Good, 7.45    Physical Exam: Physical Exam ROS Blood pressure (!) 117/99, pulse (!) 114, temperature 98.3 F (36.8 C), temperature source Oral, resp. rate 18, height 6\' 1"  (1.854 m), weight 67.6 kg, SpO2 99 %. Body mass index is 19.66 kg/m.   Treatment  Plan Summary: Daily contact with patient to assess and evaluate symptoms and progress in treatment and Medication management 40 year old male presenting for worsening psychosis, agitation, and poor self care. Endorsing AH  denies VH/HI/SI. Appears anxious and guarded on exam. Continue Risperdal 4 mg QHS, continue cogentin 0.5 mg QHS for EPS prophylaxis. Continue propranolol 20 mg BID for tachycardia and anxiety, titrate as needed. Hydroxyzine PRN for anxiety. Norvasc 5 mg daily for HTN, titrate as needed. PRNs in place for agitation though calm and cooperative thus far. Lipid panel with slightly elevated LDL and total cholesterol. Dietary counseling. Hemoglobin a1c 5.0. Cannabis cessation counseling.   05/07/21: Psychiatric exam above reviewed and remains accurate. Assessment and plan above reviewed and updated.    Jesse Sans, MD 05/07/2021, 10:00 AM

## 2021-05-07 NOTE — Progress Notes (Signed)
Patient alert and oriented x 4, interacting appropriately with peers and staff, no distress noted, he denies SI/HI/AVH, receptive to staff, complaint with medication,15 minutes safety checks maintained will continue to monitor.

## 2021-05-07 NOTE — Group Note (Signed)
LCSW Group Therapy Note  Group Date: 05/07/2021 Start Time: 1330 End Time: 1410   Type of Therapy and Topic:  Group Therapy - Healthy vs Unhealthy Coping Skills  Participation Level:  Did Not Attend   Description of Group The focus of this group was to determine what unhealthy coping techniques typically are used by group members and what healthy coping techniques would be helpful in coping with various problems. Patients were guided in becoming aware of the differences between healthy and unhealthy coping techniques. Patients were asked to identify 2-3 healthy coping skills they would like to learn to use more effectively.  Therapeutic Goals Patients learned that coping is what human beings do all day long to deal with various situations in their lives Patients defined and discussed healthy vs unhealthy coping techniques Patients identified their preferred coping techniques and identified whether these were healthy or unhealthy Patients determined 2-3 healthy coping skills they would like to become more familiar with and use more often. Patients provided support and ideas to each other   Summary of Patient Progress: Patient did not attend group despite encouraged participation.    Therapeutic Modalities Cognitive Behavioral Therapy Motivational Interviewing  Marletta Lor 05/07/2021  4:38 PM

## 2021-05-08 DIAGNOSIS — F312 Bipolar disorder, current episode manic severe with psychotic features: Secondary | ICD-10-CM | POA: Diagnosis not present

## 2021-05-08 MED ORDER — RISPERIDONE 1 MG PO TABS
5.0000 mg | ORAL_TABLET | Freq: Every day | ORAL | Status: DC
  Administered 2021-05-08 – 2021-05-12 (×5): 5 mg via ORAL
  Filled 2021-05-08 (×5): qty 5

## 2021-05-08 MED ORDER — BENZTROPINE MESYLATE 1 MG PO TABS
1.0000 mg | ORAL_TABLET | Freq: Every day | ORAL | Status: DC
  Administered 2021-05-08 – 2021-05-14 (×7): 1 mg via ORAL
  Filled 2021-05-08 (×7): qty 1

## 2021-05-08 NOTE — Group Note (Signed)
LCSW Group Therapy Note  Group Date: 05/08/2021 Start Time: 1300 End Time: 1350   Type of Therapy and Topic:  Group Therapy - How To Cope with Nervousness about Discharge   Participation Level:  Did Not Attend   Description of Group This process group involved identification of patients' feelings about discharge. Some of them are scheduled to be discharged soon, while others are new admissions, but each of them was asked to share thoughts and feelings surrounding discharge from the hospital. One common theme was that they are excited at the prospect of going home, while another was that many of them are apprehensive about sharing why they were hospitalized. Patients were given the opportunity to discuss these feelings with their peers in preparation for discharge.  Therapeutic Goals  Patient will identify their overall feelings about pending discharge. Patient will think about how they might proactively address issues that they believe will once again arise once they get home (i.e. with parents). Patients will participate in discussion about having hope for change.   Summary of Patient Progress: Patient did not attend group despite encouraged participation.    Therapeutic Modalities Cognitive Behavioral Therapy   Varun Jourdan K Vernee Baines, LCSWA 05/08/2021  5:12 PM   

## 2021-05-08 NOTE — Plan of Care (Signed)
  Problem: Education: Goal: Knowledge of [facility] Education information/materials will improve Outcome: Progressing Goal: Emotional status will improve Outcome: Progressing Goal: Mental status will improve Outcome: Progressing Goal: Verbalization of understanding the information provided will improve Outcome: Progressing   Problem: Activity: Goal: Interest or engagement in activities will improve Outcome: Progressing Goal: Sleeping patterns will improve Outcome: Progressing   Problem: Coping: Goal: Ability to verbalize frustrations and anger appropriately will improve Outcome: Progressing Goal: Ability to demonstrate self-control will improve Outcome: Progressing   Problem: Physical Regulation: Goal: Ability to maintain clinical measurements within normal limits will improve Outcome: Progressing   Problem: Safety: Goal: Periods of time without injury will increase Outcome: Progressing   Problem: Education: Goal: Ability to state activities that reduce stress will improve Outcome: Progressing   Problem: Coping: Goal: Ability to identify and develop effective coping behavior will improve Outcome: Progressing   Problem: Self-Concept: Goal: Ability to identify factors that promote anxiety will improve Outcome: Progressing Goal: Level of anxiety will decrease Outcome: Progressing Goal: Ability to modify response to factors that promote anxiety will improve Outcome: Progressing   Problem: Activity: Goal: Will verbalize the importance of balancing activity with adequate rest periods Outcome: Progressing   Problem: Coping: Goal: Coping ability will improve Outcome: Progressing Goal: Will verbalize feelings Outcome: Progressing   Problem: Health Behavior/Discharge Planning: Goal: Compliance with prescribed medication regimen will improve Outcome: Progressing

## 2021-05-08 NOTE — Progress Notes (Signed)
Patient resting in the bed this am. RN had to prompt patient to get out of bed for medications and meals. Patient took medication without any difficulty. Denies SI/HI/AVH and pain. Rates anxiety and depression at a 5/10/. Patient isolates and withdraws throughout the day. Does not participate in groups or interact with peers. Will cont Q15 minute check for safety.

## 2021-05-08 NOTE — Progress Notes (Signed)
[facility] MD Progress Note  05/08/2021 8:25 AM HARJOT DIBELLO  MRN:  [id number]  CC "Okay"  Subjective:  40 year old male presenting for worsening psychosis, agitation, and poor self care. No acute events overnight, medication compliant, ADLs impaired. Patient seen one-on-one this morning. He continues to report command auditory hallucinations, but states they are improving. Mostly heard in the evening now. He denies SI/HI/VH. Feels medications partially helpful, and agreeable to further increase.   Principal Problem: Bipolar affective disorder, currently manic, severe, with psychotic features (HCC) Diagnosis: Principal Problem:   Bipolar affective disorder, currently manic, severe, with psychotic features (HCC) Active Problems:   Cannabis use disorder, moderate, dependence (HCC)  Total Time spent with patient: 30 minutes  Past Psychiatric History: See H&P  Past Medical History:  Past Medical History:  Diagnosis Date   Back pain    Bipolar 1 disorder (HCC)    Hepatitis C    Kidney stones    Schizo affective schizophrenia (HCC)    Testicular cyst    History reviewed. No pertinent surgical history. Family History:  Family History  Problem Relation Age of Onset   Stroke Father    Hypertension Father    Family Psychiatric  History: See H&P Social History:  Social History   Substance and Sexual Activity  Alcohol Use Not Currently     Social History   Substance and Sexual Activity  Drug Use Yes   Frequency: 7.0 times per week   Types: Marijuana   Comment: Daily use; last use is 03/26/2019    Social History   Socioeconomic History   Marital status: Single    Spouse name: Not on file   Number of children: Not on file   Years of education: Not on file   Highest education level: Not on file  Occupational History   Occupation: Unemployed  Tobacco Use   Smoking status: Every Day    Packs/day: 1.00    Years: 15.00    Pack years: 15.00    Types: Cigarettes   Smokeless  tobacco: Never  Vaping Use   Vaping Use: Never used  Substance and Sexual Activity   Alcohol use: Not Currently   Drug use: Yes    Frequency: 7.0 times per week    Types: Marijuana    Comment: Daily use; last use is 03/26/2019   Sexual activity: Never    Birth control/protection: Condom  Other Topics Concern   Not on file  Social History Narrative   Pt stated that he is currently living with his brother.  He lives in [place] and is unemployed.  Pt stated that he recently arranged an appointment with [facility].   Social Determinants of Health   Financial Resource Strain: Not on file  Food Insecurity: Not on file  Transportation Needs: Not on file  Physical Activity: Not on file  Stress: Not on file  Social Connections: Not on file   Additional Social History:                         Sleep: Fair  Appetite:  Poor  Current Medications: Current Facility-Administered Medications  Medication Dose Route Frequency Provider Last Rate Last Admin   acetaminophen (TYLENOL) tablet 650 mg  650 mg Oral Q6H PRN Clapacs, Jackquline Denmark, MD   650 mg at 05/06/21 0741   alum & mag hydroxide-simeth (MAALOX/MYLANTA) 200-200-20 MG/5ML suspension 30 mL  30 mL Oral Q4H PRN Clapacs, Jackquline Denmark, MD  amLODipine (NORVASC) tablet 5 mg  5 mg Oral Daily Les Pou M, MD   5 mg at 05/07/21 0802   benztropine (COGENTIN) tablet 1 mg  1 mg Oral QHS Jesse Sans, MD       feeding supplement (ENSURE ENLIVE / ENSURE PLUS) liquid 237 mL  237 mL Oral TID BM Jesse Sans, MD   237 mL at 05/07/21 2100   hydrALAZINE (APRESOLINE) tablet 10 mg  10 mg Oral Q6H PRN Jesse Sans, MD   10 mg at 05/05/21 2048   hydrOXYzine (ATARAX/VISTARIL) tablet 50 mg  50 mg Oral Q6H PRN Clapacs, John T, MD   50 mg at 05/07/21 0802   magnesium hydroxide (MILK OF MAGNESIA) suspension 30 mL  30 mL Oral Daily PRN Clapacs, Jackquline Denmark, MD       OLANZapine (ZYPREXA) tablet 10 mg  10 mg Oral Q6H PRN Jesse Sans, MD        propranolol (INDERAL) tablet 20 mg  20 mg Oral BID Jesse Sans, MD   20 mg at 05/07/21 1609   risperiDONE (RISPERDAL) tablet 5 mg  5 mg Oral QHS Jesse Sans, MD       traZODone (DESYREL) tablet 100 mg  100 mg Oral QHS PRN Jesse Sans, MD   100 mg at 05/07/21 2148   ziprasidone (GEODON) injection 20 mg  20 mg Intramuscular Q6H PRN Jesse Sans, MD        Lab Results:  No results found for this or any previous visit (from the past 48 hour(s)).   Blood Alcohol level:  Lab Results  Component Value Date   ETH <10 03/26/2019   ETH <11 10/11/2011    Metabolic Disorder Labs: Lab Results  Component Value Date   HGBA1C 5.0 05/05/2021   MPG 96.8 05/05/2021   No results found for: PROLACTIN Lab Results  Component Value Date   CHOL 202 (H) 05/05/2021   TRIG 83 05/05/2021   HDL 35 (L) 05/05/2021   CHOLHDL 5.8 05/05/2021   VLDL 17 05/05/2021   LDLCALC 150 (H) 05/05/2021    Physical Findings: AIMS:  , ,  ,  ,    CIWA:    COWS:     Musculoskeletal: Strength & Muscle Tone: within normal limits Gait & Station: normal Patient leans: N/A  Psychiatric Specialty Exam:  Presentation  General Appearance: Appropriate for Environment  Eye Contact:Good  Speech:Normal rate Speech Volume:Normal Handedness:Right   Mood and Affect  Mood:Anxious  Affect:Congruent   Thought Process  Thought Processes:Linear Descriptions of Associations:Goal directed Orientation:Full (Time, Place and Person)  Thought Content:Paranoid Ideation; Delusions  History of Schizophrenia/Schizoaffective disorder:Yes  Duration of Psychotic Symptoms:Greater than six months  Hallucinations:Auditory  Ideas of Reference:Delusions; Paranoia  Suicidal Thoughts:Denies  Homicidal Thoughts:Denies   Sensorium  Memory:Immediate Fair; Recent Fair; Remote Fair  Judgment:Poor  Insight:Shallow   Executive Functions  Concentration:Fair  Attention Span:Fair  Recall:Fair  Fund of  Knowledge:Fair  Language:Fair   Psychomotor Activity  Psychomotor Activity:Restless   Assets  Assets:Communication Skills; Desire for Improvement; Housing; Social Support   Sleep  Sleep:Good, 8.25    Physical Exam: Physical Exam ROS Blood pressure 118/87, pulse 83, temperature 97.6 F (36.4 C), temperature source Oral, resp. rate 17, height 6\' 1"  (1.854 m), weight 67.6 kg, SpO2 98 %. Body mass index is 19.66 kg/m.   Treatment Plan Summary: Daily contact with patient to assess and evaluate symptoms and progress in treatment and Medication management 40 year old male  presenting for worsening psychosis, agitation, and poor self care. Endorsing AH  denies VH/HI/SI. Appears anxious and guarded on exam. Increase Risperdal 5 mg QHS, increase cogentin 1 mg QHS for EPS prophylaxis. Continue propranolol 20 mg BID for tachycardia and anxiety, titrate as needed. Hydroxyzine PRN for anxiety. Norvasc 5 mg daily for HTN, titrate as needed. PRNs in place for agitation though calm and cooperative thus far. Lipid panel with slightly elevated LDL and total cholesterol. Dietary counseling. Hemoglobin a1c 5.0. Cannabis cessation counseling.   05/08/21: Psychiatric exam above reviewed and remains accurate. Assessment and plan above reviewed and updated.     Jesse Sans, MD 05/08/2021, 8:25 AM

## 2021-05-09 DIAGNOSIS — F312 Bipolar disorder, current episode manic severe with psychotic features: Secondary | ICD-10-CM | POA: Diagnosis not present

## 2021-05-09 NOTE — Plan of Care (Signed)
  Problem: Education: Goal: Emotional status will improve Outcome: Progressing   Problem: Education: Goal: Mental status will improve Outcome: Progressing   Problem: Education: Goal: Verbalization of understanding the information provided will improve Outcome: Progressing   Problem: Activity: Goal: Interest or engagement in activities will improve Outcome: Progressing   Problem: Activity: Goal: Sleeping patterns will improve Outcome: Progressing   Problem: Coping: Goal: Ability to verbalize frustrations and anger appropriately will improve Outcome: Progressing   Problem: Coping: Goal: Ability to demonstrate self-control will improve Outcome: Progressing

## 2021-05-09 NOTE — Progress Notes (Signed)
[facility] MD Progress Note  05/09/2021 10:24 AM Nicholas Nielsen  MRN:  [id number]  CC "Little better"  Subjective:  40 year old male presenting for worsening psychosis, agitation, and poor self care. No acute events overnight, medication compliant, ADLs impaired. Patient reports continued AH, but denies SI/HI/AH. Today he complains of fatigue as medication side effect. He continues to mainly isolate to room, and appears guarded and anxious.   Principal Problem: Bipolar affective disorder, currently manic, severe, with psychotic features (HCC) Diagnosis: Principal Problem:   Bipolar affective disorder, currently manic, severe, with psychotic features (HCC) Active Problems:   Cannabis use disorder, moderate, dependence (HCC)  Total Time spent with patient: 30 minutes  Past Psychiatric History: See H&P  Past Medical History:  Past Medical History:  Diagnosis Date   Back pain    Bipolar 1 disorder (HCC)    Hepatitis C    Kidney stones    Schizo affective schizophrenia (HCC)    Testicular cyst    History reviewed. No pertinent surgical history. Family History:  Family History  Problem Relation Age of Onset   Stroke Father    Hypertension Father    Family Psychiatric  History: See H&P Social History:  Social History   Substance and Sexual Activity  Alcohol Use Not Currently     Social History   Substance and Sexual Activity  Drug Use Yes   Frequency: 7.0 times per week   Types: Marijuana   Comment: Daily use; last use is 03/26/2019    Social History   Socioeconomic History   Marital status: Single    Spouse name: Not on file   Number of children: Not on file   Years of education: Not on file   Highest education level: Not on file  Occupational History   Occupation: Unemployed  Tobacco Use   Smoking status: Every Day    Packs/day: 1.00    Years: 15.00    Pack years: 15.00    Types: Cigarettes   Smokeless tobacco: Never  Vaping Use   Vaping Use: Never used   Substance and Sexual Activity   Alcohol use: Not Currently   Drug use: Yes    Frequency: 7.0 times per week    Types: Marijuana    Comment: Daily use; last use is 03/26/2019   Sexual activity: Never    Birth control/protection: Condom  Other Topics Concern   Not on file  Social History Narrative   Pt stated that he is currently living with his brother.  He lives in [place] and is unemployed.  Pt stated that he recently arranged an appointment with [facility].   Social Determinants of Health   Financial Resource Strain: Not on file  Food Insecurity: Not on file  Transportation Needs: Not on file  Physical Activity: Not on file  Stress: Not on file  Social Connections: Not on file   Additional Social History:                         Sleep: Fair  Appetite:  Poor  Current Medications: Current Facility-Administered Medications  Medication Dose Route Frequency Provider Last Rate Last Admin   acetaminophen (TYLENOL) tablet 650 mg  650 mg Oral Q6H PRN Clapacs, John T, MD   650 mg at 05/06/21 0741   alum & mag hydroxide-simeth (MAALOX/MYLANTA) 200-200-20 MG/5ML suspension 30 mL  30 mL Oral Q4H PRN Clapacs, John T, MD       amLODipine (NORVASC) tablet 5 mg  5 mg Oral Daily Jesse Sans, MD   5 mg at 05/09/21 0805   benztropine (COGENTIN) tablet 1 mg  1 mg Oral QHS Jesse Sans, MD   1 mg at 05/08/21 2219   feeding supplement (ENSURE ENLIVE / ENSURE PLUS) liquid 237 mL  237 mL Oral TID BM Jesse Sans, MD   237 mL at 05/08/21 2035   hydrALAZINE (APRESOLINE) tablet 10 mg  10 mg Oral Q6H PRN Jesse Sans, MD   10 mg at 05/08/21 5732   hydrOXYzine (ATARAX/VISTARIL) tablet 50 mg  50 mg Oral Q6H PRN Clapacs, Jackquline Denmark, MD   50 mg at 05/09/21 0805   magnesium hydroxide (MILK OF MAGNESIA) suspension 30 mL  30 mL Oral Daily PRN Clapacs, Jackquline Denmark, MD       OLANZapine (ZYPREXA) tablet 10 mg  10 mg Oral Q6H PRN Jesse Sans, MD   10 mg at 05/08/21 2025   propranolol  (INDERAL) tablet 20 mg  20 mg Oral BID Jesse Sans, MD   20 mg at 05/09/21 0805   risperiDONE (RISPERDAL) tablet 5 mg  5 mg Oral QHS Jesse Sans, MD   5 mg at 05/08/21 2219   traZODone (DESYREL) tablet 100 mg  100 mg Oral QHS PRN Jesse Sans, MD   100 mg at 05/07/21 2148   ziprasidone (GEODON) injection 20 mg  20 mg Intramuscular Q6H PRN Jesse Sans, MD        Lab Results:  No results found for this or any previous visit (from the past 48 hour(s)).   Blood Alcohol level:  Lab Results  Component Value Date   ETH <10 03/26/2019   ETH <11 10/11/2011    Metabolic Disorder Labs: Lab Results  Component Value Date   HGBA1C 5.0 05/05/2021   MPG 96.8 05/05/2021   No results found for: PROLACTIN Lab Results  Component Value Date   CHOL 202 (H) 05/05/2021   TRIG 83 05/05/2021   HDL 35 (L) 05/05/2021   CHOLHDL 5.8 05/05/2021   VLDL 17 05/05/2021   LDLCALC 150 (H) 05/05/2021    Physical Findings: AIMS:  , ,  ,  ,    CIWA:    COWS:     Musculoskeletal: Strength & Muscle Tone: within normal limits Gait & Station: normal Patient leans: N/A  Psychiatric Specialty Exam:  Presentation  General Appearance: Appropriate for Environment  Eye Contact:Good  Speech:Normal rate Speech Volume:Normal Handedness:Right   Mood and Affect  Mood:Anxious  Affect:Congruent   Thought Process  Thought Processes:Linear Descriptions of Associations:Goal directed Orientation:Full (Time, Place and Person)  Thought Content:Paranoid Ideation; Delusions  History of Schizophrenia/Schizoaffective disorder:Yes  Duration of Psychotic Symptoms:Greater than six months  Hallucinations:Auditory  Ideas of Reference:Delusions; Paranoia  Suicidal Thoughts:Denies  Homicidal Thoughts:Denies   Sensorium  Memory:Immediate Fair; Recent Fair; Remote Fair  Judgment:Poor  Insight:Shallow   Executive Functions  Concentration:Fair  Attention  Span:Fair  Recall:Fair  Fund of Knowledge:Fair  Language:Fair   Psychomotor Activity  Psychomotor Activity:Restless   Assets  Assets:Communication Skills; Desire for Improvement; Housing; Social Support   Sleep  Sleep:Good, 8.15    Physical Exam: Physical Exam ROS Blood pressure 114/87, pulse 81, temperature 98 F (36.7 C), temperature source Oral, resp. rate 17, height 6\' 1"  (1.854 m), weight 67.6 kg, SpO2 100 %. Body mass index is 19.66 kg/m.   Treatment Plan Summary: Daily contact with patient to assess and evaluate symptoms and progress in treatment and Medication  management 40 year old male presenting for worsening psychosis, agitation, and poor self care. Endorsing AH  denies VH/HI/SI. Appears anxious and guarded on exam. Continue Risperdal 5 mg QHS, continue cogentin 1 mg QHS for EPS prophylaxis. Continue propranolol 20 mg BID for tachycardia and anxiety, titrate as needed. Hydroxyzine PRN for anxiety. Norvasc 5 mg daily for HTN, titrate as needed. PRNs in place for agitation though calm and cooperative thus far. Lipid panel with slightly elevated LDL and total cholesterol. Dietary counseling. Hemoglobin a1c 5.0. Cannabis cessation counseling.   05/09/21: Psychiatric exam above reviewed and remains accurate. Assessment and plan above reviewed and updated.      Jesse Sans, MD 05/09/2021, 10:24 AM

## 2021-05-09 NOTE — Plan of Care (Signed)
Patient verbalized his anxiety 9/10. Patient does not want to talk about the triggering reason for his anxiety. Patients mother called without password. Patient refused to call her back. Isolates in his room except for meals. Denies SI,HI and AVH. Patient stated that Vistaril helped with anxiety. Compliant with medications. Patient remains safe in the unit.Support and encouragement given.

## 2021-05-09 NOTE — Progress Notes (Signed)
Patient has been isolative to his room. He is pleasant and cooperative and received QHS meds without incident. He denies si/hi/avh depression and pain. He does endorse anxiety rating at 8/10 and presents anxious during conversation.  Will continue to monitor with Q 15 minute safety rounds and encourages him to seek staff for any concerns.     Cleo Dolby-Nicholson, LPN

## 2021-05-09 NOTE — Progress Notes (Signed)
Recreation Therapy Notes   Date: 05/09/2021   Time: 10:15 am   Location: Craft room    Behavioral response: N/A   Intervention Topic: Stress   Discussion/Intervention: Patient did not attend group.   Clinical Observations/Feedback:  Patient did not attend group.   Savayah Waltrip LRT/CTRS        Jalyssa Fleisher 05/09/2021 12:11 PM

## 2021-05-09 NOTE — Group Note (Signed)
[facility] LCSW Group Therapy Note    Group Date: 05/09/2021 Start Time: 1300 End Time: 1400  Type of Therapy and Topic:  Group Therapy:  Overcoming Obstacles  Participation Level:  [facility] PARTICIPATION LEVEL: Did Not Attend  Mood:  Description of Group:   In this group patients will be encouraged to explore what they see as obstacles to their own wellness and recovery. They will be guided to discuss their thoughts, feelings, and behaviors related to these obstacles. The group will process together ways to cope with barriers, with attention given to specific choices patients can make. Each patient will be challenged to identify changes they are motivated to make in order to overcome their obstacles. This group will be process-oriented, with patients participating in exploration of their own experiences as well as giving and receiving support and challenge from other group members.  Therapeutic Goals: 1. Patient will identify personal and current obstacles as they relate to admission. 2. Patient will identify barriers that currently interfere with their wellness or overcoming obstacles.  3. Patient will identify feelings, thought process and behaviors related to these barriers. 4. Patient will identify two changes they are willing to make to overcome these obstacles:    Summary of Patient Progress   X   Therapeutic Modalities:   Cognitive Behavioral Therapy Solution Focused Therapy Motivational Interviewing Relapse Prevention Therapy   Harden Mo, LCSW

## 2021-05-09 NOTE — BH IP Treatment Plan (Signed)
Interdisciplinary Treatment and Diagnostic Plan Update  05/09/2021 Time of Session: 8:30AM Nicholas Nielsen MRN: [id number]  Principal Diagnosis: Bipolar affective disorder, currently manic, severe, with psychotic features (HCC)  Secondary Diagnoses: Principal Problem:   Bipolar affective disorder, currently manic, severe, with psychotic features (HCC) Active Problems:   Cannabis use disorder, moderate, dependence (HCC)   Current Medications:  Current Facility-Administered Medications  Medication Dose Route Frequency Provider Last Rate Last Admin   acetaminophen (TYLENOL) tablet 650 mg  650 mg Oral Q6H PRN Clapacs, John T, MD   650 mg at 05/06/21 0741   alum & mag hydroxide-simeth (MAALOX/MYLANTA) 200-200-20 MG/5ML suspension 30 mL  30 mL Oral Q4H PRN Clapacs, John T, MD       amLODipine (NORVASC) tablet 5 mg  5 mg Oral Daily Jesse Sans, MD   5 mg at 05/09/21 0805   benztropine (COGENTIN) tablet 1 mg  1 mg Oral QHS Jesse Sans, MD   1 mg at 05/08/21 2219   feeding supplement (ENSURE ENLIVE / ENSURE PLUS) liquid 237 mL  237 mL Oral TID BM Jesse Sans, MD   237 mL at 05/08/21 2035   hydrALAZINE (APRESOLINE) tablet 10 mg  10 mg Oral Q6H PRN Jesse Sans, MD   10 mg at 05/08/21 3220   hydrOXYzine (ATARAX/VISTARIL) tablet 50 mg  50 mg Oral Q6H PRN Clapacs, John T, MD   50 mg at 05/09/21 0805   magnesium hydroxide (MILK OF MAGNESIA) suspension 30 mL  30 mL Oral Daily PRN Clapacs, Jackquline Denmark, MD       OLANZapine (ZYPREXA) tablet 10 mg  10 mg Oral Q6H PRN Jesse Sans, MD   10 mg at 05/08/21 0833   propranolol (INDERAL) tablet 20 mg  20 mg Oral BID Jesse Sans, MD   20 mg at 05/09/21 0805   risperiDONE (RISPERDAL) tablet 5 mg  5 mg Oral QHS Jesse Sans, MD   5 mg at 05/08/21 2219   traZODone (DESYREL) tablet 100 mg  100 mg Oral QHS PRN Jesse Sans, MD   100 mg at 05/07/21 2148   ziprasidone (GEODON) injection 20 mg  20 mg Intramuscular Q6H PRN Jesse Sans,  MD       PTA Medications: Medications Prior to Admission  Medication Sig Dispense Refill Last Dose   citalopram (CELEXA) 20 MG tablet Take 20 mg by mouth every morning.      HYDROcodone-acetaminophen (NORCO/VICODIN) 5-325 MG tablet Take 1 tablet by mouth every 6 (six) hours as needed for severe pain. (Patient not taking: No sig reported) 10 tablet 0    hydrOXYzine (ATARAX/VISTARIL) 25 MG tablet Take 1 tablet (25 mg total) by mouth every 8 (eight) hours as needed for anxiety. (Patient not taking: Reported on 03/17/2019) 15 tablet 0    hydrOXYzine (ATARAX/VISTARIL) 25 MG tablet Take 1 tablet (25 mg total) by mouth every 6 (six) hours as needed for anxiety. (Patient not taking: No sig reported) 30 tablet 2    ondansetron (ZOFRAN ODT) 4 MG disintegrating tablet Take 1 tablet (4 mg total) by mouth every 8 (eight) hours as needed for nausea or vomiting. (Patient not taking: Reported on 03/26/2019) 20 tablet 0    ondansetron (ZOFRAN) 4 MG tablet Take 1 tablet (4 mg total) by mouth every 8 (eight) hours as needed for nausea or vomiting. (Patient not taking: Reported on 05/03/2021) 15 tablet 0    sertraline (ZOLOFT) 50 MG tablet Take 1 tablet (50  mg total) by mouth daily. (Patient not taking: Reported on 05/03/2021) 30 tablet 1    traZODone (DESYREL) 50 MG tablet Take 50 mg by mouth at bedtime.       Patient Stressors:    Patient Strengths:    Treatment Modalities: Medication Management, Group therapy, Case management,  1 to 1 session with clinician, Psychoeducation, Recreational therapy.   Physician Treatment Plan for Primary Diagnosis: Bipolar affective disorder, currently manic, severe, with psychotic features (HCC) Long Term Goal(s): Improvement in symptoms so as ready for discharge   Short Term Goals: Ability to identify changes in lifestyle to reduce recurrence of condition will improve Ability to verbalize feelings will improve Ability to disclose and discuss suicidal ideas Ability to  demonstrate self-control will improve Ability to identify and develop effective coping behaviors will improve Ability to maintain clinical measurements within normal limits will improve Compliance with prescribed medications will improve Ability to identify triggers associated with substance abuse/mental health issues will improve  Medication Management: Evaluate patient's response, side effects, and tolerance of medication regimen.  Therapeutic Interventions: 1 to 1 sessions, Unit Group sessions and Medication administration.  Evaluation of Outcomes: Progressing  Physician Treatment Plan for Secondary Diagnosis: Principal Problem:   Bipolar affective disorder, currently manic, severe, with psychotic features (HCC) Active Problems:   Cannabis use disorder, moderate, dependence (HCC)  Long Term Goal(s): Improvement in symptoms so as ready for discharge   Short Term Goals: Ability to identify changes in lifestyle to reduce recurrence of condition will improve Ability to verbalize feelings will improve Ability to disclose and discuss suicidal ideas Ability to demonstrate self-control will improve Ability to identify and develop effective coping behaviors will improve Ability to maintain clinical measurements within normal limits will improve Compliance with prescribed medications will improve Ability to identify triggers associated with substance abuse/mental health issues will improve     Medication Management: Evaluate patient's response, side effects, and tolerance of medication regimen.  Therapeutic Interventions: 1 to 1 sessions, Unit Group sessions and Medication administration.  Evaluation of Outcomes: Progressing   RN Treatment Plan for Primary Diagnosis: Bipolar affective disorder, currently manic, severe, with psychotic features (HCC) Long Term Goal(s): Knowledge of disease and therapeutic regimen to maintain health will improve  Short Term Goals: Ability to remain free  from injury will improve, Ability to verbalize frustration and anger appropriately will improve, Ability to demonstrate self-control, Ability to participate in decision making will improve, Ability to verbalize feelings will improve, Ability to disclose and discuss suicidal ideas, Ability to identify and develop effective coping behaviors will improve, and Compliance with prescribed medications will improve  Medication Management: RN will administer medications as ordered by provider, will assess and evaluate patient's response and provide education to patient for prescribed medication. RN will report any adverse and/or side effects to prescribing provider.  Therapeutic Interventions: 1 on 1 counseling sessions, Psychoeducation, Medication administration, Evaluate responses to treatment, Monitor vital signs and CBGs as ordered, Perform/monitor CIWA, COWS, AIMS and Fall Risk screenings as ordered, Perform wound care treatments as ordered.  Evaluation of Outcomes: Progressing   LCSW Treatment Plan for Primary Diagnosis: Bipolar affective disorder, currently manic, severe, with psychotic features (HCC) Long Term Goal(s): Safe transition to appropriate next level of care at discharge, Engage patient in therapeutic group addressing interpersonal concerns.  Short Term Goals: Engage patient in aftercare planning with referrals and resources, Increase social support, Increase ability to appropriately verbalize feelings, Increase emotional regulation, Facilitate patient progression through stages of change regarding substance use  diagnoses and concerns, and Identify triggers associated with mental health/substance abuse issues  Therapeutic Interventions: Assess for all discharge needs, 1 to 1 time with Social worker, Explore available resources and support systems, Assess for adequacy in community support network, Educate family and significant other(s) on suicide prevention, Complete Psychosocial Assessment,  Interpersonal group therapy.  Evaluation of Outcomes: Progressing   Progress in Treatment: Attending groups: No. Participating in groups: No. Taking medication as prescribed: Yes. Toleration medication: Yes. Family/Significant other contact made: No, will contact:  when given permission. Patient understands diagnosis: No. Discussing patient identified problems/goals with staff: Yes. Medical problems stabilized or resolved: Yes. Denies suicidal/homicidal ideation: Yes. Issues/concerns per patient self-inventory: No. Other: none.  New problem(s) identified: No, Describe:  none.  New Short Term/Long Term Goal(s): detox, elimination of symptoms of psychosis, medication management for mood stabilization; elimination of SI thoughts; development of comprehensive mental wellness/sobriety plan. 05/09/21 Update: No changes at this time.   Patient Goals: "Just my medicine." 05/09/21 Update: No changes at this time.    Discharge Plan or Barriers: CSW will assist with development of an appropriate aftercare/discharge plan. 05/09/21 Update: No changes at this time.  Reason for Continuation of Hospitalization: Hallucinations Medication stabilization Suicidal ideation  Estimated Length of Stay: 1-7 days   Scribe for Treatment Team: Glenis Smoker, LCSW 05/09/2021 9:29 AM

## 2021-05-10 DIAGNOSIS — F312 Bipolar disorder, current episode manic severe with psychotic features: Secondary | ICD-10-CM | POA: Diagnosis not present

## 2021-05-10 MED ORDER — LITHIUM CARBONATE ER 300 MG PO TBCR
300.0000 mg | EXTENDED_RELEASE_TABLET | Freq: Two times a day (BID) | ORAL | Status: DC
  Administered 2021-05-10 – 2021-05-15 (×11): 300 mg via ORAL
  Filled 2021-05-10 (×12): qty 1

## 2021-05-10 NOTE — Plan of Care (Signed)
  Problem: Education: Goal: Mental status will improve Outcome: Progressing   Problem: Education: Goal: Emotional status will improve Outcome: Progressing   Problem: Education: Goal: Knowledge of [facility] Education information/materials will improve Outcome: Progressing   Problem: Education: Goal: Verbalization of understanding the information provided will improve Outcome: Progressing   Problem: Activity: Goal: Interest or engagement in activities will improve Outcome: Progressing Goal: Sleeping patterns will improve Outcome: Progressing   Problem: Activity: Goal: Sleeping patterns will improve Outcome: Progressing

## 2021-05-10 NOTE — Progress Notes (Addendum)
Patient reports not sleeping well on am assessment. States, "Its that sleeping pill they gave me. I slept about 1 hour. "    Denies SI/HI/AH/VH. Poor eye contact when speaking with nurse. Appears anxious. Rates anxiety 6/10 and depression 4/10. Denies pain. PRN medication given. Takes all medication as prescribed. No s/s of adverse effects noted. Cont to monitor.

## 2021-05-10 NOTE — Progress Notes (Signed)
Patient approached nurses station requesting his QHS meds.  Reported feeling better today and declined the need for PRN Vistaril for anxiety.He denies si/hi/avh depression and pain at this encounter.  He is med complaint and received his medications without incident.  He has been out in the dayroom spending time watching TV and hanging out with other patients. This is a vast improvement as he normally isolates to his room.  Encouraged him to seek staff for any concerns and will continue to monitor with Q 15 minute safety rounds.    Cleo Thain-Nicholson, LPN

## 2021-05-10 NOTE — Plan of Care (Signed)
  Problem: Education: Goal: Knowledge of  General Education information/materials will improve Outcome: Progressing Goal: Emotional status will improve Outcome: Progressing Goal: Mental status will improve Outcome: Progressing Goal: Verbalization of understanding the information provided will improve Outcome: Progressing   Problem: Activity: Goal: Interest or engagement in activities will improve Outcome: Progressing Goal: Sleeping patterns will improve Outcome: Progressing   Problem: Coping: Goal: Ability to verbalize frustrations and anger appropriately will improve Outcome: Progressing Goal: Ability to demonstrate self-control will improve Outcome: Progressing   Problem: Health Behavior/Discharge Planning: Goal: Identification of resources available to assist in meeting health care needs will improve Outcome: Progressing Goal: Compliance with treatment plan for underlying cause of condition will improve Outcome: Progressing   Problem: Physical Regulation: Goal: Ability to maintain clinical measurements within normal limits will improve Outcome: Progressing   Problem: Safety: Goal: Periods of time without injury will increase Outcome: Progressing   

## 2021-05-10 NOTE — Progress Notes (Signed)
[facility] MD Progress Note  05/10/2021 10:24 AM Nicholas Nielsen  MRN:  [id number]  CC "Still hearing voices"  Subjective:  40 year old male presenting for worsening psychosis, agitation, and poor self care. No acute events overnight, medication compliant, ADLs impaired. Patient only slept 1 hour overnight, and continues to have severe anxiety and paranoia. Reports he is hearing other people's thoughts and having auditory hallucinations. Notes he was on a mood stabilizer in the past which was helpful. He is agreeable to starting lithium today. Denies SI/HI.   Principal Problem: Bipolar affective disorder, currently manic, severe, with psychotic features (HCC) Diagnosis: Principal Problem:   Bipolar affective disorder, currently manic, severe, with psychotic features (HCC) Active Problems:   Cannabis use disorder, moderate, dependence (HCC)  Total Time spent with patient: 30 minutes  Past Psychiatric History: See H&P  Past Medical History:  Past Medical History:  Diagnosis Date   Back pain    Bipolar 1 disorder (HCC)    Hepatitis C    Kidney stones    Schizo affective schizophrenia (HCC)    Testicular cyst    History reviewed. No pertinent surgical history. Family History:  Family History  Problem Relation Age of Onset   Stroke Father    Hypertension Father    Family Psychiatric  History: See H&P Social History:  Social History   Substance and Sexual Activity  Alcohol Use Not Currently     Social History   Substance and Sexual Activity  Drug Use Yes   Frequency: 7.0 times per week   Types: Marijuana   Comment: Daily use; last use is 03/26/2019    Social History   Socioeconomic History   Marital status: Single    Spouse name: Not on file   Number of children: Not on file   Years of education: Not on file   Highest education level: Not on file  Occupational History   Occupation: Unemployed  Tobacco Use   Smoking status: Every Day    Packs/day: 1.00    Years: 15.00     Pack years: 15.00    Types: Cigarettes   Smokeless tobacco: Never  Vaping Use   Vaping Use: Never used  Substance and Sexual Activity   Alcohol use: Not Currently   Drug use: Yes    Frequency: 7.0 times per week    Types: Marijuana    Comment: Daily use; last use is 03/26/2019   Sexual activity: Never    Birth control/protection: Condom  Other Topics Concern   Not on file  Social History Narrative   Pt stated that he is currently living with his brother.  He lives in [place] and is unemployed.  Pt stated that he recently arranged an appointment with [facility].   Social Determinants of Health   Financial Resource Strain: Not on file  Food Insecurity: Not on file  Transportation Needs: Not on file  Physical Activity: Not on file  Stress: Not on file  Social Connections: Not on file   Additional Social History:                         Sleep: Poor  Appetite:  Poor  Current Medications: Current Facility-Administered Medications  Medication Dose Route Frequency Provider Last Rate Last Admin   acetaminophen (TYLENOL) tablet 650 mg  650 mg Oral Q6H PRN Clapacs, Jackquline Denmark, MD   650 mg at 05/06/21 0741   alum & mag hydroxide-simeth (MAALOX/MYLANTA) 200-200-20 MG/5ML suspension 30 mL  30  mL Oral Q4H PRN Clapacs, John T, MD       amLODipine (NORVASC) tablet 5 mg  5 mg Oral Daily Jesse Sans, MD   5 mg at 05/10/21 0748   benztropine (COGENTIN) tablet 1 mg  1 mg Oral QHS Jesse Sans, MD   1 mg at 05/09/21 2113   feeding supplement (ENSURE ENLIVE / ENSURE PLUS) liquid 237 mL  237 mL Oral TID BM Jesse Sans, MD   237 mL at 05/10/21 1017   hydrALAZINE (APRESOLINE) tablet 10 mg  10 mg Oral Q6H PRN Jesse Sans, MD   10 mg at 05/08/21 0962   hydrOXYzine (ATARAX/VISTARIL) tablet 50 mg  50 mg Oral Q6H PRN Clapacs, Jackquline Denmark, MD   50 mg at 05/10/21 0748   lithium carbonate (LITHOBID) CR tablet 300 mg  300 mg Oral Q12H Jesse Sans, MD       magnesium hydroxide  (MILK OF MAGNESIA) suspension 30 mL  30 mL Oral Daily PRN Clapacs, Jackquline Denmark, MD       OLANZapine (ZYPREXA) tablet 10 mg  10 mg Oral Q6H PRN Jesse Sans, MD   10 mg at 05/10/21 0748   propranolol (INDERAL) tablet 20 mg  20 mg Oral BID Jesse Sans, MD   20 mg at 05/10/21 0748   risperiDONE (RISPERDAL) tablet 5 mg  5 mg Oral QHS Jesse Sans, MD   5 mg at 05/09/21 2113   traZODone (DESYREL) tablet 100 mg  100 mg Oral QHS PRN Jesse Sans, MD   100 mg at 05/09/21 2113   ziprasidone (GEODON) injection 20 mg  20 mg Intramuscular Q6H PRN Jesse Sans, MD        Lab Results:  No results found for this or any previous visit (from the past 48 hour(s)).   Blood Alcohol level:  Lab Results  Component Value Date   ETH <10 03/26/2019   ETH <11 10/11/2011    Metabolic Disorder Labs: Lab Results  Component Value Date   HGBA1C 5.0 05/05/2021   MPG 96.8 05/05/2021   No results found for: PROLACTIN Lab Results  Component Value Date   CHOL 202 (H) 05/05/2021   TRIG 83 05/05/2021   HDL 35 (L) 05/05/2021   CHOLHDL 5.8 05/05/2021   VLDL 17 05/05/2021   LDLCALC 150 (H) 05/05/2021    Physical Findings: AIMS:  , ,  ,  ,    CIWA:    COWS:     Musculoskeletal: Strength & Muscle Tone: within normal limits Gait & Station: normal Patient leans: N/A  Psychiatric Specialty Exam:  Presentation  General Appearance: Appropriate for Environment  Eye Contact:Good  Speech:Normal rate Speech Volume:Normal Handedness:Right   Mood and Affect  Mood:Anxious  Affect:Congruent   Thought Process  Thought Processes:Linear Descriptions of Associations:Goal directed Orientation:Full (Time, Place and Person)  Thought Content:Paranoid Ideation; Delusions  History of Schizophrenia/Schizoaffective disorder:Yes  Duration of Psychotic Symptoms:Greater than six months  Hallucinations:Auditory  Ideas of Reference:Delusions; Paranoia  Suicidal Thoughts:Denies  Homicidal  Thoughts:Denies   Sensorium  Memory:Immediate Fair; Recent Fair; Remote Fair  Judgment:Poor  Insight:Shallow   Executive Functions  Concentration:Fair  Attention Span:Fair  Recall:Fair  Fund of Knowledge:Fair  Language:Fair   Psychomotor Activity  Psychomotor Activity:Restless   Assets  Assets:Communication Skills; Desire for Improvement; Housing; Social Support   Sleep  Sleep:poor, 1 hour    Physical Exam: Physical Exam ROS Blood pressure 102/70, pulse 67, temperature 97.9 F (36.6 C),  temperature source Oral, resp. rate 18, height 6\' 1"  (1.854 m), weight 67.6 kg, SpO2 100 %. Body mass index is 19.66 kg/m.   Treatment Plan Summary: Daily contact with patient to assess and evaluate symptoms and progress in treatment and Medication management 40 year old male presenting for worsening psychosis, agitation, and poor self care. Endorsing AH  denies VH/HI/SI. Appears anxious and guarded on exam. Reporting that he is hearing other people's thoughts. Felt he did better with mood stabilizer in the past. Start Lithium Cr 300 mg BID. Continue Risperdal 5 mg QHS, continue cogentin 1 mg QHS for EPS prophylaxis. Continue propranolol 20 mg BID for tachycardia and anxiety, titrate as needed. Hydroxyzine PRN for anxiety. Norvasc 5 mg daily for HTN, titrate as needed. PRNs in place for agitation though calm and cooperative thus far. Lipid panel with slightly elevated LDL and total cholesterol. Dietary counseling. Hemoglobin a1c 5.0. Cannabis cessation counseling.   05/10/21: Psychiatric exam above reviewed and remains accurate. Assessment and plan above reviewed and updated.     13/08/22, MD 05/10/2021, 10:24 AM

## 2021-05-10 NOTE — Group Note (Signed)
[facility] LCSW Group Therapy Note   Group Date: 05/10/2021 Start Time: 1300 End Time: 1400  Type of Therapy/Topic:  Group Therapy:  Feelings about Diagnosis  Participation Level:  Did Not Attend   Mood: n/a   Description of Group:    This group will allow patients to explore their thoughts and feelings about diagnoses they have received. Patients will be guided to explore their level of understanding and acceptance of these diagnoses. Facilitator will encourage patients to process their thoughts and feelings about the reactions of others to their diagnosis, and will guide patients in identifying ways to discuss their diagnosis with significant others in their lives. This group will be process-oriented, with patients participating in exploration of their own experiences as well as giving and receiving support and challenge from other group members.   Therapeutic Goals: 1. Patient will demonstrate understanding of diagnosis as evidence by identifying two or more symptoms of the disorder:  2. Patient will be able to express two feelings regarding the diagnosis 3. Patient will demonstrate ability to communicate their needs through discussion and/or role plays  Summary of Patient Progress: Patient did not attend group despite encouraged participation.     Therapeutic Modalities:   Cognitive Behavioral Therapy Brief Therapy Feelings Identification    Corky Crafts, [place]

## 2021-05-10 NOTE — Progress Notes (Signed)
Recreation Therapy Notes   Date: 05/10/2021   Time: 10:00am     Location: Craft room    Behavioral response: N/A   Intervention Topic: Self-care   Discussion/Intervention: Patient did not attend group.   Clinical Observations/Feedback:  Patient did not attend group.   Noriel Guthrie LRT/CTRS        Nicholas Nielsen 05/10/2021 12:07 PM

## 2021-05-10 NOTE — Progress Notes (Signed)
Patient remains isolative this shift. Only comes out for medications and meals.

## 2021-05-11 DIAGNOSIS — F312 Bipolar disorder, current episode manic severe with psychotic features: Secondary | ICD-10-CM | POA: Diagnosis not present

## 2021-05-11 LAB — THYROID PANEL WITH TSH
Free Thyroxine Index: 2 (ref 1.2–4.9)
T3 Uptake Ratio: 26 % (ref 24–39)
T4, Total: 7.6 ug/dL (ref 4.5–12.0)
TSH: 3.35 u[IU]/mL (ref 0.450–4.500)

## 2021-05-11 NOTE — Plan of Care (Signed)
Patient stated that his anxiety is getting better and he could sleep better last night. Patient remains isolates in his room. Denies SI,HI and AVH at this time. Patient stated that he don't feel comfortable around people. Appetite and energy level good. ADLs maintained.Support and encouragement given.

## 2021-05-11 NOTE — Progress Notes (Signed)
Patient alert and oriented x 4, affect is flat but brightens upon approach no distress noted, he was interacting appropriately with peers and staff, he denies SI/HI/AVH, receptive to staff, complaint with medication,15 minutes safety checks maintained will continue to monitor.

## 2021-05-11 NOTE — Progress Notes (Signed)
[facility] MD Progress Note  05/11/2021 10:20 AM Nicholas Nielsen  MRN:  [id number]  CC "Feel a little better"  Subjective:  40 year old male presenting for worsening psychosis, agitation, and poor self care. No acute events overnight, medication compliant, ADLs impaired. Patient seen one-on-one today. He feels that his thoughts are not racing as quickly, and he has not had auditory hallucinations since last night. He does note he continues to feel he is still hearing other people's thoughts. Denies SI/HI. Still appears guarded, but less anxious than days prior.   Principal Problem: Bipolar affective disorder, currently manic, severe, with psychotic features (HCC) Diagnosis: Principal Problem:   Bipolar affective disorder, currently manic, severe, with psychotic features (HCC) Active Problems:   Cannabis use disorder, moderate, dependence (HCC)  Total Time spent with patient: 20 minutes  Past Psychiatric History: See H&P  Past Medical History:  Past Medical History:  Diagnosis Date   Back pain    Bipolar 1 disorder (HCC)    Hepatitis C    Kidney stones    Schizo affective schizophrenia (HCC)    Testicular cyst    History reviewed. No pertinent surgical history. Family History:  Family History  Problem Relation Age of Onset   Stroke Father    Hypertension Father    Family Psychiatric  History: See H&P Social History:  Social History   Substance and Sexual Activity  Alcohol Use Not Currently     Social History   Substance and Sexual Activity  Drug Use Yes   Frequency: 7.0 times per week   Types: Marijuana   Comment: Daily use; last use is 03/26/2019    Social History   Socioeconomic History   Marital status: Single    Spouse name: Not on file   Number of children: Not on file   Years of education: Not on file   Highest education level: Not on file  Occupational History   Occupation: Unemployed  Tobacco Use   Smoking status: Every Day    Packs/day: 1.00    Years:  15.00    Pack years: 15.00    Types: Cigarettes   Smokeless tobacco: Never  Vaping Use   Vaping Use: Never used  Substance and Sexual Activity   Alcohol use: Not Currently   Drug use: Yes    Frequency: 7.0 times per week    Types: Marijuana    Comment: Daily use; last use is 03/26/2019   Sexual activity: Never    Birth control/protection: Condom  Other Topics Concern   Not on file  Social History Narrative   Pt stated that he is currently living with his brother.  He lives in [place] and is unemployed.  Pt stated that he recently arranged an appointment with [facility].   Social Determinants of Health   Financial Resource Strain: Not on file  Food Insecurity: Not on file  Transportation Needs: Not on file  Physical Activity: Not on file  Stress: Not on file  Social Connections: Not on file   Additional Social History:                         Sleep: Fair  Appetite:  Poor  Current Medications: Current Facility-Administered Medications  Medication Dose Route Frequency Provider Last Rate Last Admin   acetaminophen (TYLENOL) tablet 650 mg  650 mg Oral Q6H PRN Clapacs, Jackquline Denmark, MD   650 mg at 05/06/21 0741   alum & mag hydroxide-simeth (MAALOX/MYLANTA) 200-200-20 MG/5ML suspension 30  mL  30 mL Oral Q4H PRN Clapacs, John T, MD       amLODipine (NORVASC) tablet 5 mg  5 mg Oral Daily Jesse Sans, MD   5 mg at 05/11/21 0749   benztropine (COGENTIN) tablet 1 mg  1 mg Oral QHS Jesse Sans, MD   1 mg at 05/10/21 2200   feeding supplement (ENSURE ENLIVE / ENSURE PLUS) liquid 237 mL  237 mL Oral TID BM Jesse Sans, MD   237 mL at 05/10/21 2100   hydrALAZINE (APRESOLINE) tablet 10 mg  10 mg Oral Q6H PRN Jesse Sans, MD   10 mg at 05/08/21 5176   hydrOXYzine (ATARAX/VISTARIL) tablet 50 mg  50 mg Oral Q6H PRN Clapacs, Jackquline Denmark, MD   50 mg at 05/10/21 0748   lithium carbonate (LITHOBID) CR tablet 300 mg  300 mg Oral Q12H Jesse Sans, MD   300 mg at 05/11/21  0749   magnesium hydroxide (MILK OF MAGNESIA) suspension 30 mL  30 mL Oral Daily PRN Clapacs, Jackquline Denmark, MD       OLANZapine (ZYPREXA) tablet 10 mg  10 mg Oral Q6H PRN Jesse Sans, MD   10 mg at 05/10/21 0748   propranolol (INDERAL) tablet 20 mg  20 mg Oral BID Jesse Sans, MD   20 mg at 05/11/21 0749   risperiDONE (RISPERDAL) tablet 5 mg  5 mg Oral QHS Jesse Sans, MD   5 mg at 05/10/21 2200   traZODone (DESYREL) tablet 100 mg  100 mg Oral QHS PRN Jesse Sans, MD   100 mg at 05/11/21 0134   ziprasidone (GEODON) injection 20 mg  20 mg Intramuscular Q6H PRN Jesse Sans, MD        Lab Results:  Results for orders placed or performed during the hospital encounter of 05/03/21 (from the past 48 hour(s))  Thyroid Panel With TSH     Status: None   Collection Time: 05/10/21 10:38 AM  Result Value Ref Range   TSH 3.350 0.450 - 4.500 uIU/mL   T4, Total 7.6 4.5 - 12.0 ug/dL   T3 Uptake Ratio 26 24 - 39 %   Free Thyroxine Index 2.0 1.2 - 4.9    Comment: (NOTE) Performed At: [facility] [address] [place], [place] [id number] Jolene Schimke MD YI:[id number]      Blood Alcohol level:  Lab Results  Component Value Date   [facility] <10 03/26/2019   ETH <11 10/11/2011    Metabolic Disorder Labs: Lab Results  Component Value Date   HGBA1C 5.0 05/05/2021   MPG 96.8 05/05/2021   No results found for: PROLACTIN Lab Results  Component Value Date   CHOL 202 (H) 05/05/2021   TRIG 83 05/05/2021   HDL 35 (L) 05/05/2021   CHOLHDL 5.8 05/05/2021   VLDL 17 05/05/2021   LDLCALC 150 (H) 05/05/2021    Physical Findings: AIMS:  , ,  ,  ,    CIWA:    COWS:     Musculoskeletal: Strength & Muscle Tone: within normal limits Gait & Station: normal Patient leans: N/A  Psychiatric Specialty Exam:  Presentation  General Appearance: Appropriate for Environment  Eye Contact:Good  Speech:Normal rate Speech Volume:Normal Handedness:Right   Mood and Affect   Mood:Anxious  Affect:Congruent   Thought Process  Thought Processes:Linear Descriptions of Associations:Goal directed Orientation:Full (Time, Place and Person)  Thought Content:Paranoid Ideation; Delusions  History of Schizophrenia/Schizoaffective disorder:Yes  Duration of Psychotic Symptoms:Greater  than six months  Hallucinations:Auditory  Ideas of Reference:Delusions; Paranoia  Suicidal Thoughts:Denies  Homicidal Thoughts:Denies   Sensorium  Memory:Immediate Fair; Recent Fair; Remote Fair  Judgment:Poor  Insight:Shallow   Executive Functions  Concentration:Fair  Attention Span:Fair  Recall:Fair  Fund of Knowledge:Fair  Language:Fair   Psychomotor Activity  Psychomotor Activity:Restless   Assets  Assets:Communication Skills; Desire for Improvement; Housing; Social Support   Sleep  Sleep:Fair, 7.45 hours    Physical Exam: Physical Exam ROS Blood pressure 115/81, pulse (!) 101, temperature (!) 97.5 F (36.4 C), temperature source Oral, resp. rate 18, height 6\' 1"  (1.854 m), weight 67.6 kg, SpO2 98 %. Body mass index is 19.66 kg/m.   Treatment Plan Summary: Daily contact with patient to assess and evaluate symptoms and progress in treatment and Medication management 40 year old male presenting for worsening psychosis, agitation, and poor self care. Endorsing thought broadcasting, denies AH/VH/HI/SI. Appears guarded on exam though less anxious today. Continue Lithium Cr 300 mg BID, check level 11/12. Continue Risperdal 5 mg QHS, continue cogentin 1 mg QHS for EPS prophylaxis. Continue propranolol 20 mg BID for tachycardia and anxiety, titrate as needed. Hydroxyzine PRN for anxiety. Norvasc 5 mg daily for HTN, titrate as needed.   05/11/21: Psychiatric exam above reviewed and remains accurate. Assessment and plan above reviewed and updated.      13/09/22, MD 05/11/2021, 10:20 AM

## 2021-05-11 NOTE — Group Note (Signed)
[facility] LCSW Group Therapy Note   Group Date: 05/11/2021 Start Time: 1300 End Time: 1350   Type of Therapy/Topic:  Group Therapy:  Emotion Regulation  Participation Level:  Did Not Attend   Mood:  Description of Group:    The purpose of this group is to assist patients in learning to regulate negative emotions and experience positive emotions. Patients will be guided to discuss ways in which they have been vulnerable to their negative emotions. These vulnerabilities will be juxtaposed with experiences of positive emotions or situations, and patients challenged to use positive emotions to combat negative ones. Special emphasis will be placed on coping with negative emotions in conflict situations, and patients will process healthy conflict resolution skills.  Therapeutic Goals: Patient will identify two positive emotions or experiences to reflect on in order to balance out negative emotions:  Patient will label two or more emotions that they find the most difficult to experience:  Patient will be able to demonstrate positive conflict resolution skills through discussion or role plays:   Summary of Patient Progress: X  Therapeutic Modalities:   Cognitive Behavioral Therapy Feelings Identification Dialectical Behavioral Therapy   Glenis Smoker, LCSW

## 2021-05-11 NOTE — Progress Notes (Signed)
Recreation Therapy Notes   Date: 05/11/2021   Time: 10:15 am     Location:  Courtyard/Craft room    Behavioral response: N/A   Intervention Topic: Leisure    Discussion/Intervention: Patient did not attend group.   Clinical Observations/Feedback:  Patient did not attend group.   Brucha Ahlquist LRT/CTRS          Syesha Thaw 05/11/2021 12:34 PM

## 2021-05-12 DIAGNOSIS — F312 Bipolar disorder, current episode manic severe with psychotic features: Secondary | ICD-10-CM | POA: Diagnosis not present

## 2021-05-12 NOTE — Progress Notes (Signed)
   05/12/21 0830  Psych Admission Type (Psych Patients Only)  Admission Status Involuntary  Psychosocial Assessment  Patient Complaints Anxiety  Eye Contact Poor  Facial Expression Flat  Affect Anxious;Sullen  Speech Soft;Slow;Logical/coherent  Interaction Cautious  Motor Activity Slow  Appearance/Hygiene Unremarkable  Behavior Characteristics Cooperative;Appropriate to situation  Mood Anxious  Aggressive Behavior  Effect No apparent injury  Thought Process  Coherency Circumstantial  Content UTA  Delusions None reported or observed  Perception WDL  Hallucination None reported or observed  Judgment Poor  Confusion None  Danger to Self  Current suicidal ideation? Denies  Danger to Others  Danger to Others None reported or observed   D: Pt alert and oriented. Pt denies rates anxiety 4/10 and depression 4/10 at this time. Pt denies experiencing any pain at this time. Pt denies experiencing any SI/HI, or AVH at this time, describes mood as :up and down.  Pt noted goal to work on today as "I want to work on feeling better."   A: Scheduled medications administered to pt, per MD orders. Vistaril 50 mg po prn was given at 0830 for c/o anxiety. Support and encouragement provided. Frequent verbal contact made. Routine safety checks conducted q15 minutes.   R: No adverse drug reactions noted. Pt reported that offered vistaril 50 mg po prn for anxiety was effective. Pt complaint with medications. Pt interacts minimally with others on the unit. Pt remains safe at this time. Will continue to monitor.

## 2021-05-12 NOTE — Progress Notes (Signed)
Recreation Therapy Notes   Date: 05/12/2021  Time: 10:30 am   Location: Craft room    Behavioral response: N/A   Intervention Topic: Communication    Discussion/Intervention: Patient did not attend group.   Clinical Observations/Feedback:  Patient did not attend group.   Molly Savarino LRT/CTRS        Charlaine Utsey 05/12/2021 12:22 PM

## 2021-05-12 NOTE — Plan of Care (Signed)
  Problem: Education: Goal: Emotional status will improve Outcome: Progressing Goal: Mental status will improve Outcome: Progressing   Problem: Activity: Goal: Interest or engagement in activities will improve Outcome: Progressing   Problem: Safety: Goal: Periods of time without injury will increase Outcome: Progressing

## 2021-05-12 NOTE — Group Note (Signed)
[facility] LCSW Group Therapy Note   Group Date: 05/12/2021 Start Time: 1300 End Time: 1400   Type of Therapy/Topic:  Group Therapy:  Balance in Life  Participation Level:  Did Not Attend   Description of Group:    This group will address the concept of balance and how it feels and looks when one is unbalanced. Patients will be encouraged to process areas in their lives that are out of balance, and identify reasons for remaining unbalanced. Facilitators will guide patients utilizing problem- solving interventions to address and correct the stressor making their life unbalanced. Understanding and applying boundaries will be explored and addressed for obtaining  and maintaining a balanced life. Patients will be encouraged to explore ways to assertively make their unbalanced needs known to significant others in their lives, using other group members and facilitator for support and feedback.  Therapeutic Goals: Patient will identify two or more emotions or situations they have that consume much of in their lives. Patient will identify signs/triggers that life has become out of balance:  Patient will identify two ways to set boundaries in order to achieve balance in their lives:  Patient will demonstrate ability to communicate their needs through discussion and/or role plays  Summary of Patient Progress:    X    Therapeutic Modalities:   Cognitive Behavioral Therapy Solution-Focused Therapy Assertiveness Training   Harden Mo, LCSW

## 2021-05-12 NOTE — Progress Notes (Signed)
Patient pleasant and cooperative. Denies SI, Hi, AVH. Medication compliant. Appropriate with staff and peers. Isolative to self and room, refused snacks other than Ensure.  Encouragement and support provided. Safety checks maintained. Medications given as prescribed. Pt receptive and remains safe on unit with q 15 min checks.

## 2021-05-12 NOTE — Progress Notes (Signed)
Patient is isolative to his room. He denies si/hi/avh at this encounter.  He does endorse anxiety and depression but reports he is getting better.  He is med compliant and received his meds without incident. Will continue to monitor with q 15 minute safety rounds.  Encouraged him to seek staff with any concerns.   Cleo Podolsky-Nicholson, LPN

## 2021-05-12 NOTE — Plan of Care (Signed)
  Problem: Education: Goal: Emotional status will improve Outcome: Progressing   Problem: Education: Goal: Mental status will improve Outcome: Progressing   Problem: Education: Goal: Verbalization of understanding the information provided will improve Outcome: Progressing   Problem: Coping: Goal: Ability to demonstrate self-control will improve Outcome: Progressing   Problem: Health Behavior/Discharge Planning: Goal: Compliance with treatment plan for underlying cause of condition will improve Outcome: Progressing

## 2021-05-12 NOTE — Progress Notes (Signed)
Patient ID: Nicholas Nielsen, male   DOB: 1981/01/13, 40 y.o.   MRN: [id number] test

## 2021-05-12 NOTE — Progress Notes (Signed)
[facility] MD Progress Note  05/12/2021 2:20 PM Nicholas Nielsen  MRN:  [id number] Subjective: Follow-up for 40 year old man with a diagnosis of bipolar disorder.  Spoke with patient reviewed the chart.  Patient describes the symptoms he had been suffering from recently with severe anxiety including fear of being around other people, panic-like anxiety, some obsessive-compulsive like anxiety as well as clear psychotic symptoms with ideas of reference and imagining that people were able to communicate with him telepathically.  Patient reports that symptoms are a little better in the hospital but continues to have some psychotic symptoms very anxious and agitated at times.  Not threatening or hostile.  Compliant with medicine. Principal Problem: Bipolar affective disorder, currently manic, severe, with psychotic features (HCC) Diagnosis: Principal Problem:   Bipolar affective disorder, currently manic, severe, with psychotic features (HCC) Active Problems:   Cannabis use disorder, moderate, dependence (HCC)  Total Time spent with patient: 30 minutes  Past Psychiatric History: Past history of mood disorder suicidality  Past Medical History:  Past Medical History:  Diagnosis Date   Back pain    Bipolar 1 disorder (HCC)    Hepatitis C    Kidney stones    Schizo affective schizophrenia (HCC)    Testicular cyst    History reviewed. No pertinent surgical history. Family History:  Family History  Problem Relation Age of Onset   Stroke Father    Hypertension Father    Family Psychiatric  History: See previous Social History:  Social History   Substance and Sexual Activity  Alcohol Use Not Currently     Social History   Substance and Sexual Activity  Drug Use Yes   Frequency: 7.0 times per week   Types: Marijuana   Comment: Daily use; last use is 03/26/2019    Social History   Socioeconomic History   Marital status: Single    Spouse name: Not on file   Number of children: Not on file    Years of education: Not on file   Highest education level: Not on file  Occupational History   Occupation: Unemployed  Tobacco Use   Smoking status: Every Day    Packs/day: 1.00    Years: 15.00    Pack years: 15.00    Types: Cigarettes   Smokeless tobacco: Never  Vaping Use   Vaping Use: Never used  Substance and Sexual Activity   Alcohol use: Not Currently   Drug use: Yes    Frequency: 7.0 times per week    Types: Marijuana    Comment: Daily use; last use is 03/26/2019   Sexual activity: Never    Birth control/protection: Condom  Other Topics Concern   Not on file  Social History Narrative   Pt stated that he is currently living with his brother.  He lives in [place] and is unemployed.  Pt stated that he recently arranged an appointment with [facility].   Social Determinants of Health   Financial Resource Strain: Not on file  Food Insecurity: Not on file  Transportation Needs: Not on file  Physical Activity: Not on file  Stress: Not on file  Social Connections: Not on file   Additional Social History:                         Sleep: Fair  Appetite:  Fair  Current Medications: Current Facility-Administered Medications  Medication Dose Route Frequency Provider Last Rate Last Admin   acetaminophen (TYLENOL) tablet 650 mg  650 mg  Oral Q6H PRN Rosangela Fehrenbach, Jackquline Denmark, MD   650 mg at 05/06/21 0741   alum & mag hydroxide-simeth (MAALOX/MYLANTA) 200-200-20 MG/5ML suspension 30 mL  30 mL Oral Q4H PRN Belinda Schlichting T, MD       amLODipine (NORVASC) tablet 5 mg  5 mg Oral Daily Jesse Sans, MD   5 mg at 05/12/21 0834   benztropine (COGENTIN) tablet 1 mg  1 mg Oral QHS Jesse Sans, MD   1 mg at 05/11/21 2207   feeding supplement (ENSURE ENLIVE / ENSURE PLUS) liquid 237 mL  237 mL Oral TID BM Jesse Sans, MD   237 mL at 05/12/21 1352   hydrALAZINE (APRESOLINE) tablet 10 mg  10 mg Oral Q6H PRN Jesse Sans, MD   10 mg at 05/08/21 4097   hydrOXYzine  (ATARAX/VISTARIL) tablet 50 mg  50 mg Oral Q6H PRN Zailen Albarran, Jackquline Denmark, MD   50 mg at 05/12/21 0836   lithium carbonate (LITHOBID) CR tablet 300 mg  300 mg Oral Q12H Jesse Sans, MD   300 mg at 05/12/21 0834   magnesium hydroxide (MILK OF MAGNESIA) suspension 30 mL  30 mL Oral Daily PRN Hisham Provence, Jackquline Denmark, MD       OLANZapine (ZYPREXA) tablet 10 mg  10 mg Oral Q6H PRN Jesse Sans, MD   10 mg at 05/10/21 0748   propranolol (INDERAL) tablet 20 mg  20 mg Oral BID Jesse Sans, MD   20 mg at 05/12/21 0834   risperiDONE (RISPERDAL) tablet 5 mg  5 mg Oral QHS Jesse Sans, MD   5 mg at 05/11/21 2207   ziprasidone (GEODON) injection 20 mg  20 mg Intramuscular Q6H PRN Jesse Sans, MD        Lab Results: No results found for this or any previous visit (from the past 48 hour(s)).  Blood Alcohol level:  Lab Results  Component Value Date   ETH <10 03/26/2019   ETH <11 10/11/2011    Metabolic Disorder Labs: Lab Results  Component Value Date   HGBA1C 5.0 05/05/2021   MPG 96.8 05/05/2021   No results found for: PROLACTIN Lab Results  Component Value Date   CHOL 202 (H) 05/05/2021   TRIG 83 05/05/2021   HDL 35 (L) 05/05/2021   CHOLHDL 5.8 05/05/2021   VLDL 17 05/05/2021   LDLCALC 150 (H) 05/05/2021    Physical Findings: AIMS:  , ,  ,  ,    CIWA:    COWS:     Musculoskeletal: Strength & Muscle Tone: within normal limits Gait & Station: normal Patient leans: N/A  Psychiatric Specialty Exam:  Presentation  General Appearance: Appropriate for Environment  Eye Contact:Good  Speech:Pressured  Speech Volume:Increased  Handedness:Right   Mood and Affect  Mood:Anxious  Affect:Congruent   Thought Process  Thought Processes:Disorganized  Descriptions of Associations:Tangential  Orientation:Full (Time, Place and Person)  Thought Content:Paranoid Ideation; Delusions  History of Schizophrenia/Schizoaffective disorder:Yes  Duration of Psychotic  Symptoms:Greater than six months  Hallucinations:No data recorded Ideas of Reference:Delusions; Paranoia  Suicidal Thoughts:No data recorded Homicidal Thoughts:No data recorded  Sensorium  Memory:Immediate Fair; Recent Fair; Remote Fair  Judgment:Poor  Insight:Shallow   Executive Functions  Concentration:Fair  Attention Span:Fair  Recall:Fair  Fund of Knowledge:Fair  Language:Fair   Psychomotor Activity  Psychomotor Activity:No data recorded  Assets  Assets:Communication Skills; Desire for Improvement; Housing; Social Support   Sleep  Sleep:No data recorded   Physical Exam: Physical Exam Vitals  and nursing note reviewed.  Constitutional:      Appearance: Normal appearance.  HENT:     Head: Normocephalic and atraumatic.     Mouth/Throat:     Pharynx: Oropharynx is clear.  Eyes:     Pupils: Pupils are equal, round, and reactive to light.  Cardiovascular:     Rate and Rhythm: Normal rate and regular rhythm.  Pulmonary:     Effort: Pulmonary effort is normal.     Breath sounds: Normal breath sounds.  Abdominal:     General: Abdomen is flat.     Palpations: Abdomen is soft.  Musculoskeletal:        General: Normal range of motion.  Skin:    General: Skin is warm and dry.  Neurological:     General: No focal deficit present.     Mental Status: He is alert. Mental status is at baseline.  Psychiatric:        Attention and Perception: Attention normal. He perceives auditory hallucinations.        Mood and Affect: Mood normal. Affect is blunt.        Speech: Speech normal.        Behavior: Behavior is slowed.        Thought Content: Thought content is paranoid and delusional.        Cognition and Memory: Cognition is impaired.        Judgment: Judgment is impulsive.   Review of Systems  Constitutional: Negative.   HENT: Negative.    Eyes: Negative.   Respiratory: Negative.    Cardiovascular: Negative.   Gastrointestinal: Negative.    Musculoskeletal: Negative.   Skin: Negative.   Neurological: Negative.   Psychiatric/Behavioral:  Positive for depression and hallucinations. Negative for substance abuse and suicidal ideas. The patient is nervous/anxious.   Blood pressure 113/85, pulse 96, temperature 98.1 F (36.7 C), temperature source Oral, resp. rate 18, height 6\' 1"  (1.854 m), weight 67.6 kg, SpO2 100 %. Body mass index is 19.66 kg/m.   Treatment Plan Summary: Medication management and Plan patient was started on lithium a day or 2 ago.  Too early to check a blood level but I have ordered one for Sunday.  Talked with patient about his condition offered encouragement.  Continue to involve inappropriate unit activities.  Thursday, MD 05/12/2021, 2:20 PM

## 2021-05-13 DIAGNOSIS — F312 Bipolar disorder, current episode manic severe with psychotic features: Secondary | ICD-10-CM | POA: Diagnosis not present

## 2021-05-13 MED ORDER — RISPERIDONE 1 MG PO TABS
6.0000 mg | ORAL_TABLET | Freq: Every day | ORAL | Status: DC
  Administered 2021-05-13 – 2021-05-23 (×10): 6 mg via ORAL
  Filled 2021-05-13 (×10): qty 6

## 2021-05-13 MED ORDER — TEMAZEPAM 15 MG PO CAPS
15.0000 mg | ORAL_CAPSULE | Freq: Every evening | ORAL | Status: DC | PRN
  Administered 2021-05-13 – 2021-05-29 (×16): 15 mg via ORAL
  Filled 2021-05-13 (×16): qty 1

## 2021-05-13 NOTE — Progress Notes (Signed)
Recreation Therapy Notes   Date: 05/13/2021  Time: 10:00 am      Location:  Courtyard   Behavioral response: N/A   Intervention Topic: Time Management   Discussion/Intervention: Patient did not attend group.   Clinical Observations/Feedback:  Patient did not attend group.   Trenia Tennyson LRT/CTRS        Sammuel Blick 05/13/2021 11:31 AM

## 2021-05-13 NOTE — Progress Notes (Signed)
[facility] MD Progress Note  05/13/2021 4:20 PM Nicholas Nielsen  MRN:  [id number] Subjective: Follow-up for this 40 year old man with psychotic symptoms diagnosed with bipolar disorder.  Patient today says he thinks maybe he is a little better.  However, he is still having auditory hallucinations and feeling paranoid around people.  Did not sleep well last night.  Has an anxious look about him.  No complaints about medication. Principal Problem: Bipolar affective disorder, currently manic, severe, with psychotic features (HCC) Diagnosis: Principal Problem:   Bipolar affective disorder, currently manic, severe, with psychotic features (HCC) Active Problems:   Cannabis use disorder, moderate, dependence (HCC)  Total Time spent with patient: 30 minutes  Past Psychiatric History: History of mood disorder substance abuse recent worsening with psychotic symptoms  Past Medical History:  Past Medical History:  Diagnosis Date   Back pain    Bipolar 1 disorder (HCC)    Hepatitis C    Kidney stones    Schizo affective schizophrenia (HCC)    Testicular cyst    History reviewed. No pertinent surgical history. Family History:  Family History  Problem Relation Age of Onset   Stroke Father    Hypertension Father    Family Psychiatric  History: See previous Social History:  Social History   Substance and Sexual Activity  Alcohol Use Not Currently     Social History   Substance and Sexual Activity  Drug Use Yes   Frequency: 7.0 times per week   Types: Marijuana   Comment: Daily use; last use is 03/26/2019    Social History   Socioeconomic History   Marital status: Single    Spouse name: Not on file   Number of children: Not on file   Years of education: Not on file   Highest education level: Not on file  Occupational History   Occupation: Unemployed  Tobacco Use   Smoking status: Every Day    Packs/day: 1.00    Years: 15.00    Pack years: 15.00    Types: Cigarettes   Smokeless  tobacco: Never  Vaping Use   Vaping Use: Never used  Substance and Sexual Activity   Alcohol use: Not Currently   Drug use: Yes    Frequency: 7.0 times per week    Types: Marijuana    Comment: Daily use; last use is 03/26/2019   Sexual activity: Never    Birth control/protection: Condom  Other Topics Concern   Not on file  Social History Narrative   Pt stated that he is currently living with his brother.  He lives in [place] and is unemployed.  Pt stated that he recently arranged an appointment with [facility].   Social Determinants of Health   Financial Resource Strain: Not on file  Food Insecurity: Not on file  Transportation Needs: Not on file  Physical Activity: Not on file  Stress: Not on file  Social Connections: Not on file   Additional Social History:                         Sleep: Fair  Appetite:  Fair  Current Medications: Current Facility-Administered Medications  Medication Dose Route Frequency Provider Last Rate Last Admin   acetaminophen (TYLENOL) tablet 650 mg  650 mg Oral Q6H PRN Hendryx Ricke, Jackquline Denmark, MD   650 mg at 05/06/21 0741   alum & mag hydroxide-simeth (MAALOX/MYLANTA) 200-200-20 MG/5ML suspension 30 mL  30 mL Oral Q4H PRN Brodie Scovell, Jackquline Denmark, MD  amLODipine (NORVASC) tablet 5 mg  5 mg Oral Daily Jesse Sans, MD   5 mg at 05/13/21 0733   benztropine (COGENTIN) tablet 1 mg  1 mg Oral QHS Jesse Sans, MD   1 mg at 05/12/21 2053   feeding supplement (ENSURE ENLIVE / ENSURE PLUS) liquid 237 mL  237 mL Oral TID BM Jesse Sans, MD   237 mL at 05/13/21 1401   hydrALAZINE (APRESOLINE) tablet 10 mg  10 mg Oral Q6H PRN Jesse Sans, MD   10 mg at 05/08/21 8242   hydrOXYzine (ATARAX/VISTARIL) tablet 50 mg  50 mg Oral Q6H PRN Kierston Plasencia, Jackquline Denmark, MD   50 mg at 05/13/21 0733   lithium carbonate (LITHOBID) CR tablet 300 mg  300 mg Oral Q12H Jesse Sans, MD   300 mg at 05/13/21 3536   magnesium hydroxide (MILK OF MAGNESIA) suspension 30 mL   30 mL Oral Daily PRN Ayeza Therriault, Jackquline Denmark, MD       OLANZapine (ZYPREXA) tablet 10 mg  10 mg Oral Q6H PRN Jesse Sans, MD   10 mg at 05/10/21 0748   propranolol (INDERAL) tablet 20 mg  20 mg Oral BID Jesse Sans, MD   20 mg at 05/13/21 1443   risperiDONE (RISPERDAL) tablet 6 mg  6 mg Oral QHS Pebbles Zeiders T, MD       temazepam (RESTORIL) capsule 15 mg  15 mg Oral QHS PRN Chandan Fly, Jackquline Denmark, MD       ziprasidone (GEODON) injection 20 mg  20 mg Intramuscular Q6H PRN Jesse Sans, MD        Lab Results: No results found for this or any previous visit (from the past 48 hour(s)).  Blood Alcohol level:  Lab Results  Component Value Date   ETH <10 03/26/2019   ETH <11 10/11/2011    Metabolic Disorder Labs: Lab Results  Component Value Date   HGBA1C 5.0 05/05/2021   MPG 96.8 05/05/2021   No results found for: PROLACTIN Lab Results  Component Value Date   CHOL 202 (H) 05/05/2021   TRIG 83 05/05/2021   HDL 35 (L) 05/05/2021   CHOLHDL 5.8 05/05/2021   VLDL 17 05/05/2021   LDLCALC 150 (H) 05/05/2021    Physical Findings: AIMS:  , ,  ,  ,    CIWA:    COWS:     Musculoskeletal: Strength & Muscle Tone: within normal limits Gait & Station: normal Patient leans: N/A  Psychiatric Specialty Exam:  Presentation  General Appearance: Appropriate for Environment  Eye Contact:Good  Speech:Pressured  Speech Volume:Increased  Handedness:Right   Mood and Affect  Mood:Anxious  Affect:Congruent   Thought Process  Thought Processes:Disorganized  Descriptions of Associations:Tangential  Orientation:Full (Time, Place and Person)  Thought Content:Paranoid Ideation; Delusions  History of Schizophrenia/Schizoaffective disorder:Yes  Duration of Psychotic Symptoms:Greater than six months  Hallucinations:No data recorded Ideas of Reference:Delusions; Paranoia  Suicidal Thoughts:No data recorded Homicidal Thoughts:No data recorded  Sensorium  Memory:Immediate Fair;  Recent Fair; Remote Fair  Judgment:Poor  Insight:Shallow   Executive Functions  Concentration:Fair  Attention Span:Fair  Recall:Fair  Fund of Knowledge:Fair  Language:Fair   Psychomotor Activity  Psychomotor Activity:No data recorded  Assets  Assets:Communication Skills; Desire for Improvement; Housing; Social Support   Sleep  Sleep:No data recorded   Physical Exam: Physical Exam Vitals and nursing note reviewed.  Constitutional:      Appearance: Normal appearance.  HENT:     Head: Normocephalic and atraumatic.  Mouth/Throat:     Pharynx: Oropharynx is clear.  Eyes:     Pupils: Pupils are equal, round, and reactive to light.  Cardiovascular:     Rate and Rhythm: Normal rate and regular rhythm.  Pulmonary:     Effort: Pulmonary effort is normal.     Breath sounds: Normal breath sounds.  Abdominal:     General: Abdomen is flat.     Palpations: Abdomen is soft.  Musculoskeletal:        General: Normal range of motion.  Skin:    General: Skin is warm and dry.  Neurological:     General: No focal deficit present.     Mental Status: He is alert. Mental status is at baseline.  Psychiatric:        Attention and Perception: He is inattentive. He perceives auditory hallucinations.        Mood and Affect: Mood is anxious. Affect is blunt.        Speech: Speech is delayed.        Behavior: Behavior is slowed.        Thought Content: Thought content is paranoid.        Cognition and Memory: Cognition is impaired.   Review of Systems  Constitutional: Negative.   HENT: Negative.    Eyes: Negative.   Respiratory: Negative.    Cardiovascular: Negative.   Gastrointestinal: Negative.   Musculoskeletal: Negative.   Skin: Negative.   Neurological: Negative.   Psychiatric/Behavioral:  Positive for depression and hallucinations. Negative for suicidal ideas. The patient is nervous/anxious and has insomnia.   Blood pressure (!) 112/95, pulse 82, temperature 97.7  F (36.5 C), temperature source Oral, resp. rate 18, height 6\' 1"  (1.854 m), weight 67.6 kg, SpO2 100 %. Body mass index is 19.66 kg/m.   Treatment Plan Summary: Medication management and Plan patient had been started at a low dose of lithium.  No sign of toxicity.  Blood test is scheduled for tomorrow to check level at which point it can be adjusted.  I am increasing his dose of Risperdal to 6 mg at night as he appears to be tolerating the current combination fine.  I am going to add a as needed of temazepam for sleep at night.  Encourage patient that he is getting better and to try coming out of his room at times.  Still not well enough for discharge.  , MD 05/13/2021, 4:20 PM

## 2021-05-13 NOTE — Group Note (Signed)
[facility] LCSW Group Therapy Note   Group Date: 05/13/2021 Start Time: 1300 End Time: 1340  Type of Therapy and Topic:  Group Therapy:  Feelings around Relapse and Recovery  Participation Level:  Did Not Attend   Mood:  Description of Group:    Patients in this group will discuss emotions they experience before and after a relapse. They will process how experiencing these feelings, or avoidance of experiencing them, relates to having a relapse. Facilitator will guide patients to explore emotions they have related to recovery. Patients will be encouraged to process which emotions are more powerful. They will be guided to discuss the emotional reaction significant others in their lives may have to patients' relapse or recovery. Patients will be assisted in exploring ways to respond to the emotions of others without this contributing to a relapse.  Therapeutic Goals: Patient will identify two or more emotions that lead to relapse for them:  Patient will identify two emotions that result when they relapse:  Patient will identify two emotions related to recovery:  Patient will demonstrate ability to communicate their needs through discussion and/or role plays.   Summary of Patient Progress: X  Therapeutic Modalities:   Cognitive Behavioral Therapy Solution-Focused Therapy Assertiveness Training Relapse Prevention Therapy   Glenis Smoker, LCSW

## 2021-05-13 NOTE — Progress Notes (Signed)
D: Pt alert and oriented. Pt rates anxiety 3/10 and depression 3/10 at this time. Pt denies experiencing any pain at this time. Pt denies experiencing any SI/HI, or AVH at this time, describes his mood as "alright."  Pt. Reported his goal for the day as " I want to work on getting better." Pt. Remains isolative to self in bedroom except for meds, meals, and groups.     A: Scheduled medications administered to pt, per MD orders. Pt. Requested and received vistaril 50 mg po prn at 0733 and 1700 for c/o anxiety. Support and encouragement provided. Frequent verbal contact made. Routine safety checks conducted q15 minutes.   R: No adverse drug reactions noted. Pt. Reported relief in anxiety from vistaril 50 mg po prn at reassessment. Pt is agreeable to notifying staff with any safety concerns. Pt complaint with medications. Pt interacts minimally with others on the unit. Pt remains safe at this time. Will continue to monitor.

## 2021-05-13 NOTE — Plan of Care (Signed)
  Problem: Group Participation Goal: STG - Patient will engage in groups without prompting or encouragement from LRT x3 group sessions within 5 recreation therapy group sessions Description: STG - Patient will engage in groups without prompting or encouragement from LRT x3 group sessions within 5 recreation therapy group sessions Outcome: Not Progressing   

## 2021-05-14 ENCOUNTER — Encounter: Payer: Self-pay | Admitting: Psychiatry

## 2021-05-14 MED ORDER — NICOTINE 14 MG/24HR TD PT24
14.0000 mg | MEDICATED_PATCH | Freq: Every day | TRANSDERMAL | Status: DC
  Administered 2021-05-14 – 2021-05-30 (×16): 14 mg via TRANSDERMAL
  Filled 2021-05-14 (×16): qty 1

## 2021-05-14 NOTE — Group Note (Signed)
LCSW Group Therapy Note  Group Date: 05/14/2021 Start Time: 1300 End Time: 1400   Type of Therapy and Topic:  Group Therapy - Healthy vs Unhealthy Coping Skills  Participation Level:  Did Not Attend   Description of Group The focus of this group was to determine what unhealthy coping techniques typically are used by group members and what healthy coping techniques would be helpful in coping with various problems. Patients were guided in becoming aware of the differences between healthy and unhealthy coping techniques. Patients were asked to identify 2-3 healthy coping skills they would like to learn to use more effectively.  Therapeutic Goals Patients learned that coping is what human beings do all day long to deal with various situations in their lives Patients defined and discussed healthy vs unhealthy coping techniques Patients identified their preferred coping techniques and identified whether these were healthy or unhealthy Patients determined 2-3 healthy coping skills they would like to become more familiar with and use more often. Patients provided support and ideas to each other   Summary of Patient Progress: Patient did not attend group despite encouraged participation.    Therapeutic Modalities Cognitive Behavioral Therapy Motivational Interviewing  Norberto Sorenson, Theresia Majors 05/14/2021  4:23 PM

## 2021-05-14 NOTE — Progress Notes (Signed)
Patient alert and oriented x 4, affect is flat but brightens upon approach no distress noted, he was interacting appropriately with peers and staff, he denies SI/HI/AVH, receptive to staff, complaint with medication,15 minutes safety checks maintained will continue to monitor.

## 2021-05-14 NOTE — Plan of Care (Signed)
Although patient still endorsing Auditory hallucinations. He has improved from baseline Problem: Education: Goal: Knowledge of [facility] Education information/materials will improve Outcome: Progressing Goal: Emotional status will improve Outcome: Progressing Goal: Mental status will improve Outcome: Progressing Goal: Verbalization of understanding the information provided will improve Outcome: Progressing   Problem: Activity: Goal: Interest or engagement in activities will improve Outcome: Progressing Goal: Sleeping patterns will improve Outcome: Progressing   Problem: Coping: Goal: Ability to verbalize frustrations and anger appropriately will improve Outcome: Progressing Goal: Ability to demonstrate self-control will improve Outcome: Progressing   Problem: Coping: Goal: Ability to identify and develop effective coping behavior will improve Outcome: Progressing   Problem: Self-Concept: Goal: Ability to identify factors that promote anxiety will improve Outcome: Progressing Goal: Level of anxiety will decrease Outcome: Progressing Goal: Ability to modify response to factors that promote anxiety will improve Outcome: Progressing   Problem: Safety: Goal: Periods of time without injury will increase Outcome: Progressing   Problem: Physical Regulation: Goal: Ability to maintain clinical measurements within normal limits will improve Outcome: Progressing   Problem: Coping: Goal: Coping ability will improve Outcome: Progressing Goal: Will verbalize feelings Outcome: Progressing   Problem: Education: Goal: Will be free of psychotic symptoms Outcome: Progressing Goal: Knowledge of the prescribed therapeutic regimen will improve Outcome: Progressing

## 2021-05-14 NOTE — Progress Notes (Addendum)
Patient remains in bed most of the shift. Up for meals and meds. Takes medications as prescribed. Denies SI/HI/AH/VH this am but still endorsees hearing voices periodically. Denies pain but reports both anxiety and depression at a 5/10.  Isolates throughout the day. Informed RN he does smoke  and request a nicotine patch. Smoking cessation screening complete.  Nicotine patch ordered per protocol. No adverse effects. Cont Q15 minute check for safety.

## 2021-05-14 NOTE — Progress Notes (Signed)
[facility] MD Progress Note  05/14/2021 10:45 AM Nicholas Nielsen  MRN:  [id number] Subjective:  Patient evaluated this room and records reviewed. He reports feeling better today, is still somewhat anxious and shaky. He had some difficulty sleeping last night and eventually took some PRN. He is reporting improvement in his mood and is denying current SI/HI or any AVH.  Principal Problem: Bipolar affective disorder, currently manic, severe, with psychotic features (HCC) Diagnosis: Principal Problem:   Bipolar affective disorder, currently manic, severe, with psychotic features (HCC) Active Problems:   Cannabis use disorder, moderate, dependence (HCC)  Total Time spent with patient: 20 minutes  Past Psychiatric History: Bipolar   Past Medical History:  Past Medical History:  Diagnosis Date   Back pain    Bipolar 1 disorder (HCC)    Hepatitis C    Kidney stones    Schizo affective schizophrenia (HCC)    Testicular cyst    History reviewed. No pertinent surgical history. Family History:  Family History  Problem Relation Age of Onset   Stroke Father    Hypertension Father    Family Psychiatric  History:  Social History:  Social History   Substance and Sexual Activity  Alcohol Use Not Currently     Social History   Substance and Sexual Activity  Drug Use Yes   Frequency: 7.0 times per week   Types: Marijuana   Comment: Daily use; last use is 03/26/2019    Social History   Socioeconomic History   Marital status: Single    Spouse name: Not on file   Number of children: Not on file   Years of education: Not on file   Highest education level: Not on file  Occupational History   Occupation: Unemployed  Tobacco Use   Smoking status: Every Day    Packs/day: 1.00    Years: 15.00    Pack years: 15.00    Types: Cigarettes   Smokeless tobacco: Never  Vaping Use   Vaping Use: Never used  Substance and Sexual Activity   Alcohol use: Not Currently   Drug use: Yes    Frequency:  7.0 times per week    Types: Marijuana    Comment: Daily use; last use is 03/26/2019   Sexual activity: Never    Birth control/protection: Condom  Other Topics Concern   Not on file  Social History Narrative   Pt stated that he is currently living with his brother.  He lives in [place] and is unemployed.  Pt stated that he recently arranged an appointment with [facility].   Social Determinants of Health   Financial Resource Strain: Not on file  Food Insecurity: Not on file  Transportation Needs: Not on file  Physical Activity: Not on file  Stress: Not on file  Social Connections: Not on file   Additional Social History:                         Sleep: Fair  Appetite:  Fair  Current Medications: Current Facility-Administered Medications  Medication Dose Route Frequency Provider Last Rate Last Admin   acetaminophen (TYLENOL) tablet 650 mg  650 mg Oral Q6H PRN Clapacs, John T, MD   650 mg at 05/06/21 0741   alum & mag hydroxide-simeth (MAALOX/MYLANTA) 200-200-20 MG/5ML suspension 30 mL  30 mL Oral Q4H PRN Clapacs, John T, MD       amLODipine (NORVASC) tablet 5 mg  5 mg Oral Daily Jesse Sans, MD  5 mg at 05/14/21 0750   benztropine (COGENTIN) tablet 1 mg  1 mg Oral QHS Jesse Sans, MD   1 mg at 05/13/21 2118   feeding supplement (ENSURE ENLIVE / ENSURE PLUS) liquid 237 mL  237 mL Oral TID BM Jesse Sans, MD   237 mL at 05/14/21 0935   hydrALAZINE (APRESOLINE) tablet 10 mg  10 mg Oral Q6H PRN Jesse Sans, MD   10 mg at 05/08/21 1610   hydrOXYzine (ATARAX/VISTARIL) tablet 50 mg  50 mg Oral Q6H PRN Clapacs, Jackquline Denmark, MD   50 mg at 05/13/21 1653   lithium carbonate (LITHOBID) CR tablet 300 mg  300 mg Oral Q12H Jesse Sans, MD   300 mg at 05/14/21 0750   magnesium hydroxide (MILK OF MAGNESIA) suspension 30 mL  30 mL Oral Daily PRN Clapacs, Jackquline Denmark, MD       OLANZapine (ZYPREXA) tablet 10 mg  10 mg Oral Q6H PRN Jesse Sans, MD   10 mg at 05/10/21 0748    propranolol (INDERAL) tablet 20 mg  20 mg Oral BID Jesse Sans, MD   20 mg at 05/14/21 0750   risperiDONE (RISPERDAL) tablet 6 mg  6 mg Oral QHS Clapacs, John T, MD   6 mg at 05/13/21 2118   temazepam (RESTORIL) capsule 15 mg  15 mg Oral QHS PRN Clapacs, Jackquline Denmark, MD   15 mg at 05/13/21 2118   ziprasidone (GEODON) injection 20 mg  20 mg Intramuscular Q6H PRN Jesse Sans, MD        Lab Results: No results found for this or any previous visit (from the past 48 hour(s)).  Blood Alcohol level:  Lab Results  Component Value Date   ETH <10 03/26/2019   ETH <11 10/11/2011    Metabolic Disorder Labs: Lab Results  Component Value Date   HGBA1C 5.0 05/05/2021   MPG 96.8 05/05/2021   No results found for: PROLACTIN Lab Results  Component Value Date   CHOL 202 (H) 05/05/2021   TRIG 83 05/05/2021   HDL 35 (L) 05/05/2021   CHOLHDL 5.8 05/05/2021   VLDL 17 05/05/2021   LDLCALC 150 (H) 05/05/2021    Physical Findings: AIMS:  , ,  ,  ,    CIWA:    COWS:     Musculoskeletal: Strength & Muscle Tone: within normal limits Gait & Station: normal Patient leans: N/A  Psychiatric Specialty Exam:  Presentation  General Appearance: Appropriate for Environment; Casual  Eye Contact:Fair  Speech:Pressured  Speech Volume:Normal  Handedness:Right   Mood and Affect  Mood:Anxious; Irritable  Affect:Constricted   Thought Process  Thought Processes:Coherent  Descriptions of Associations:Intact  Orientation:Full (Time, Place and Person)  Thought Content:Logical  History of Schizophrenia/Schizoaffective disorder:Yes  Duration of Psychotic Symptoms:Greater than six months  Hallucinations:Hallucinations: None Ideas of Reference:None  Suicidal Thoughts:Suicidal Thoughts: No Homicidal Thoughts:Homicidal Thoughts: No  Sensorium  Memory:Remote Good  Judgment:Fair  Insight:Fair   Executive Functions  Concentration:Fair  Attention  Span:Fair  Recall:Fair  Fund of Knowledge:Fair  Language:Fair   Psychomotor Activity  Psychomotor Activity:Psychomotor Activity: Restlessness  Assets  Assets:Communication Skills; Desire for Improvement; Housing; Physical Health; Social Support   Sleep  Sleep:Sleep: Fair   Physical Exam: Physical Exam Constitutional:      Appearance: Normal appearance. He is normal weight.  HENT:     Head: Normocephalic and atraumatic.     Nose: Nose normal.     Mouth/Throat:     Mouth:  Mucous membranes are moist.     Pharynx: Oropharynx is clear.  Eyes:     Extraocular Movements: Extraocular movements intact.     Conjunctiva/sclera: Conjunctivae normal.     Pupils: Pupils are equal, round, and reactive to light.  Cardiovascular:     Rate and Rhythm: Normal rate and regular rhythm.     Pulses: Normal pulses.     Heart sounds: Normal heart sounds.  Pulmonary:     Effort: Pulmonary effort is normal.     Breath sounds: Normal breath sounds.  Abdominal:     General: Abdomen is flat. Bowel sounds are normal.     Palpations: Abdomen is soft.  Musculoskeletal:        General: Normal range of motion.     Cervical back: Normal range of motion and neck supple.  Skin:    General: Skin is warm and dry.  Neurological:     General: No focal deficit present.     Mental Status: He is alert and oriented to person, place, and time.  Psychiatric:        Behavior: Behavior normal.   Review of Systems  Constitutional: Negative.   HENT: Negative.    Eyes: Negative.   Respiratory: Negative.    Cardiovascular: Negative.   Gastrointestinal: Negative.   Genitourinary: Negative.   Musculoskeletal: Negative.   Skin: Negative.   Neurological: Negative.   Endo/Heme/Allergies: Negative.   Psychiatric/Behavioral:  The patient is nervous/anxious.   Blood pressure 112/87, pulse 95, temperature (!) 97.3 F (36.3 C), temperature source Oral, resp. rate 18, height 6\' 1"  (1.854 m), weight 67.6 kg, SpO2  97 %. Body mass index is 19.66 kg/m.   Treatment Plan Summary: Daily contact with patient to assess and evaluate symptoms and progress in treatment, Medication management, and Plan Continue current meds and doses.   Blayke Cordrey 05/14/2021, 10:45 AM

## 2021-05-14 NOTE — BH IP Treatment Plan (Signed)
Interdisciplinary Treatment and Diagnostic Plan Update  05/14/2021 Time of Session: 10:30AM Nicholas Nielsen MRN: [id number]  Principal Diagnosis: Bipolar affective disorder, currently manic, severe, with psychotic features (HCC)  Secondary Diagnoses: Principal Problem:   Bipolar affective disorder, currently manic, severe, with psychotic features (HCC) Active Problems:   Cannabis use disorder, moderate, dependence (HCC)   Current Medications:  Current Facility-Administered Medications  Medication Dose Route Frequency Provider Last Rate Last Admin   acetaminophen (TYLENOL) tablet 650 mg  650 mg Oral Q6H PRN Clapacs, John T, MD   650 mg at 05/06/21 0741   alum & mag hydroxide-simeth (MAALOX/MYLANTA) 200-200-20 MG/5ML suspension 30 mL  30 mL Oral Q4H PRN Clapacs, John T, MD       amLODipine (NORVASC) tablet 5 mg  5 mg Oral Daily Jesse Sans, MD   5 mg at 05/14/21 0750   benztropine (COGENTIN) tablet 1 mg  1 mg Oral QHS Jesse Sans, MD   1 mg at 05/13/21 2118   feeding supplement (ENSURE ENLIVE / ENSURE PLUS) liquid 237 mL  237 mL Oral TID BM Jesse Sans, MD   237 mL at 05/14/21 0935   hydrALAZINE (APRESOLINE) tablet 10 mg  10 mg Oral Q6H PRN Jesse Sans, MD   10 mg at 05/08/21 3532   hydrOXYzine (ATARAX/VISTARIL) tablet 50 mg  50 mg Oral Q6H PRN Clapacs, John T, MD   50 mg at 05/13/21 1653   lithium carbonate (LITHOBID) CR tablet 300 mg  300 mg Oral Q12H Jesse Sans, MD   300 mg at 05/14/21 0750   magnesium hydroxide (MILK OF MAGNESIA) suspension 30 mL  30 mL Oral Daily PRN Clapacs, Jackquline Denmark, MD       OLANZapine (ZYPREXA) tablet 10 mg  10 mg Oral Q6H PRN Jesse Sans, MD   10 mg at 05/10/21 0748   propranolol (INDERAL) tablet 20 mg  20 mg Oral BID Jesse Sans, MD   20 mg at 05/14/21 0750   risperiDONE (RISPERDAL) tablet 6 mg  6 mg Oral QHS Clapacs, John T, MD   6 mg at 05/13/21 2118   temazepam (RESTORIL) capsule 15 mg  15 mg Oral QHS PRN Clapacs, John T, MD    15 mg at 05/13/21 2118   ziprasidone (GEODON) injection 20 mg  20 mg Intramuscular Q6H PRN Jesse Sans, MD       PTA Medications: Medications Prior to Admission  Medication Sig Dispense Refill Last Dose   citalopram (CELEXA) 20 MG tablet Take 20 mg by mouth every morning.      HYDROcodone-acetaminophen (NORCO/VICODIN) 5-325 MG tablet Take 1 tablet by mouth every 6 (six) hours as needed for severe pain. (Patient not taking: No sig reported) 10 tablet 0    hydrOXYzine (ATARAX/VISTARIL) 25 MG tablet Take 1 tablet (25 mg total) by mouth every 8 (eight) hours as needed for anxiety. (Patient not taking: Reported on 03/17/2019) 15 tablet 0    hydrOXYzine (ATARAX/VISTARIL) 25 MG tablet Take 1 tablet (25 mg total) by mouth every 6 (six) hours as needed for anxiety. (Patient not taking: No sig reported) 30 tablet 2    ondansetron (ZOFRAN ODT) 4 MG disintegrating tablet Take 1 tablet (4 mg total) by mouth every 8 (eight) hours as needed for nausea or vomiting. (Patient not taking: Reported on 03/26/2019) 20 tablet 0    ondansetron (ZOFRAN) 4 MG tablet Take 1 tablet (4 mg total) by mouth every 8 (eight) hours as needed  for nausea or vomiting. (Patient not taking: Reported on 05/03/2021) 15 tablet 0    sertraline (ZOLOFT) 50 MG tablet Take 1 tablet (50 mg total) by mouth daily. (Patient not taking: Reported on 05/03/2021) 30 tablet 1    traZODone (DESYREL) 50 MG tablet Take 50 mg by mouth at bedtime.       Patient Stressors:    Patient Strengths:    Treatment Modalities: Medication Management, Group therapy, Case management,  1 to 1 session with clinician, Psychoeducation, Recreational therapy.   Physician Treatment Plan for Primary Diagnosis: Bipolar affective disorder, currently manic, severe, with psychotic features (HCC) Long Term Goal(s): Improvement in symptoms so as ready for discharge   Short Term Goals: Ability to identify changes in lifestyle to reduce recurrence of condition will  improve Ability to verbalize feelings will improve Ability to disclose and discuss suicidal ideas Ability to demonstrate self-control will improve Ability to identify and develop effective coping behaviors will improve Ability to maintain clinical measurements within normal limits will improve Compliance with prescribed medications will improve Ability to identify triggers associated with substance abuse/mental health issues will improve  Medication Management: Evaluate patient's response, side effects, and tolerance of medication regimen.  Therapeutic Interventions: 1 to 1 sessions, Unit Group sessions and Medication administration.  Evaluation of Outcomes: Progressing  Physician Treatment Plan for Secondary Diagnosis: Principal Problem:   Bipolar affective disorder, currently manic, severe, with psychotic features (HCC) Active Problems:   Cannabis use disorder, moderate, dependence (HCC)  Long Term Goal(s): Improvement in symptoms so as ready for discharge   Short Term Goals: Ability to identify changes in lifestyle to reduce recurrence of condition will improve Ability to verbalize feelings will improve Ability to disclose and discuss suicidal ideas Ability to demonstrate self-control will improve Ability to identify and develop effective coping behaviors will improve Ability to maintain clinical measurements within normal limits will improve Compliance with prescribed medications will improve Ability to identify triggers associated with substance abuse/mental health issues will improve     Medication Management: Evaluate patient's response, side effects, and tolerance of medication regimen.  Therapeutic Interventions: 1 to 1 sessions, Unit Group sessions and Medication administration.  Evaluation of Outcomes: Progressing   RN Treatment Plan for Primary Diagnosis: Bipolar affective disorder, currently manic, severe, with psychotic features (HCC) Long Term Goal(s): Knowledge  of disease and therapeutic regimen to maintain health will improve  Short Term Goals: Ability to remain free from injury will improve, Ability to verbalize frustration and anger appropriately will improve, Ability to demonstrate self-control, Ability to participate in decision making will improve, Ability to verbalize feelings will improve, Ability to disclose and discuss suicidal ideas, Ability to identify and develop effective coping behaviors will improve, and Compliance with prescribed medications will improve  Medication Management: RN will administer medications as ordered by provider, will assess and evaluate patient's response and provide education to patient for prescribed medication. RN will report any adverse and/or side effects to prescribing provider.  Therapeutic Interventions: 1 on 1 counseling sessions, Psychoeducation, Medication administration, Evaluate responses to treatment, Monitor vital signs and CBGs as ordered, Perform/monitor CIWA, COWS, AIMS and Fall Risk screenings as ordered, Perform wound care treatments as ordered.  Evaluation of Outcomes: Progressing   LCSW Treatment Plan for Primary Diagnosis: Bipolar affective disorder, currently manic, severe, with psychotic features (HCC) Long Term Goal(s): Safe transition to appropriate next level of care at discharge, Engage patient in therapeutic group addressing interpersonal concerns.  Short Term Goals: Engage patient in aftercare planning with  referrals and resources, Increase social support, Increase ability to appropriately verbalize feelings, Increase emotional regulation, Facilitate acceptance of mental health diagnosis and concerns, Facilitate patient progression through stages of change regarding substance use diagnoses and concerns, and Identify triggers associated with mental health/substance abuse issues  Therapeutic Interventions: Assess for all discharge needs, 1 to 1 time with Social worker, Explore available  resources and support systems, Assess for adequacy in community support network, Educate family and significant other(s) on suicide prevention, Complete Psychosocial Assessment, Interpersonal group therapy.  Evaluation of Outcomes: Progressing   Progress in Treatment: Attending groups: No. Participating in groups: No. Taking medication as prescribed: Yes. Toleration medication: Yes. Family/Significant other contact made: No, will contact:  when given permission. Patient understands diagnosis: No. Discussing patient identified problems/goals with staff: Yes. Medical problems stabilized or resolved: Yes. Denies suicidal/homicidal ideation: Yes. Issues/concerns per patient self-inventory: No. Other: none.  New problem(s) identified: No, Describe:  none.  New Short Term/Long Term Goal(s): detox, elimination of symptoms of psychosis, medication management for mood stabilization; elimination of SI thoughts; development of comprehensive mental wellness/sobriety plan. 05/09/21 Update: No changes at this time. 05/14/21 Update:  No changes at this time.   Patient Goals:  "Just my medicine." 05/09/21 Update: No changes at this time. 05/14/21 Update:  No changes at this time.   Discharge Plan or Barriers: CSW will assist with development of an appropriate aftercare/discharge plan. 05/09/21 Update: No changes at this time.05/14/21 Update:  No changes at this time.   Reason for Continuation of Hospitalization: Hallucinations Medication stabilization Suicidal ideation  Estimated Length of Stay: TBD   Scribe for Treatment Team: Marletta Lor 05/14/2021 10:56 AM

## 2021-05-14 NOTE — Progress Notes (Addendum)
Reports not sleeping well and feeling drowsy. Rates anxiety and depression 4/10. Denies pain SI/HI/VH. When asked about auditory hallucinations. Patient states, not yet.".

## 2021-05-15 LAB — LITHIUM LEVEL: Lithium Lvl: 0.68 mmol/L (ref 0.60–1.20)

## 2021-05-15 MED ORDER — LITHIUM CARBONATE ER 450 MG PO TBCR
450.0000 mg | EXTENDED_RELEASE_TABLET | Freq: Two times a day (BID) | ORAL | Status: DC
  Administered 2021-05-15 – 2021-05-20 (×10): 450 mg via ORAL
  Filled 2021-05-15 (×10): qty 1

## 2021-05-15 MED ORDER — BENZTROPINE MESYLATE 1 MG PO TABS
1.0000 mg | ORAL_TABLET | Freq: Two times a day (BID) | ORAL | Status: DC
  Administered 2021-05-15 – 2021-05-30 (×31): 1 mg via ORAL
  Filled 2021-05-15 (×32): qty 1

## 2021-05-15 NOTE — Group Note (Signed)
LCSW Group Therapy Note  Group Date: 05/15/2021 Start Time: 1315 End Time: 1400   Type of Therapy and Topic:  Group Therapy - How To Cope with Nervousness about Discharge   Participation Level:  Did Not Attend   Description of Group This process group involved identification of patients' feelings about discharge. Some of them are scheduled to be discharged soon, while others are new admissions, but each of them was asked to share thoughts and feelings surrounding discharge from the hospital. One common theme was that they are excited at the prospect of going home, while another was that many of them are apprehensive about sharing why they were hospitalized. Patients were given the opportunity to discuss these feelings with their peers in preparation for discharge.  Therapeutic Goals  Patient will identify their overall feelings about pending discharge. Patient will think about how they might proactively address issues that they believe will once again arise once they get home (i.e. with parents). Patients will participate in discussion about having hope for change.   Summary of Patient Progress: Patient did not attend group despite encouraged participation.     Therapeutic Modalities Cognitive Behavioral Therapy   Macai Sisneros K Meg Niemeier, LCSWA 05/15/2021  2:10 PM   

## 2021-05-15 NOTE — Progress Notes (Signed)
Patient alert and oriented x 4, affect is flat but brightens upon approach no distress noted, he was interacting appropriately with peers and staff in the dayroom, he denies SI/HI/AVH, receptive to staff, complaint with medication,15 minutes safety checks maintained will continue to monitor

## 2021-05-15 NOTE — Progress Notes (Signed)
D Alert and Oriented Presents with depressed and anxious affect. Denies SI/HI/A/VH and verbally  contracted for safety.Continues to be minimal and isolative but pleasant.  A Scheduled medications administered per Provider order. Support and encouragement provided. Routine safety checks conducted every 15 minutes. Patient notified to inform staff with problems or concerns.  R. No adverse drug reactions noted. Patient contracts for safety at this time.

## 2021-05-15 NOTE — Progress Notes (Signed)
[facility] MD Progress Note  05/15/2021 11:41 AM Nicholas Nielsen  MRN:  [id number] Subjective:  Patient remains in bed most of the shift. Up for meals and meds. Takes medications as prescribed. Denies SI/HI/AH/VH this am but still endorsees hearing voices periodically. Denies pain but reports both anxiety and depression at a 5/10.  Patient reports feeling somewhat better today with improvement in his mood, anxiety and energy. He slept well last night, has vague VH while denies current SI/HI or any AH.  He has mild tremor in his hands which he states started at home when he was smoking weed. He does have some muscle stiffness. He is willing to increase dose of Lithium and believes it is helping.  Principal Problem: Bipolar affective disorder, currently manic, severe, with psychotic features (HCC) Diagnosis: Principal Problem:   Bipolar affective disorder, currently manic, severe, with psychotic features (HCC) Active Problems:   Cannabis use disorder, moderate, dependence (HCC)  Total Time spent with patient: 20 minutes  Past Psychiatric History: Bipolar   Past Medical History:  Past Medical History:  Diagnosis Date   Back pain    Bipolar 1 disorder (HCC)    Hepatitis C    Kidney stones    Schizo affective schizophrenia (HCC)    Testicular cyst    History reviewed. No pertinent surgical history. Family History:  Family History  Problem Relation Age of Onset   Stroke Father    Hypertension Father    Family Psychiatric  History: none Social History:  Social History   Substance and Sexual Activity  Alcohol Use Not Currently     Social History   Substance and Sexual Activity  Drug Use Yes   Frequency: 7.0 times per week   Types: Marijuana   Comment: Daily use; last use is 03/26/2019    Social History   Socioeconomic History   Marital status: Single    Spouse name: Not on file   Number of children: Not on file   Years of education: Not on file   Highest education level: Not on  file  Occupational History   Occupation: Unemployed  Tobacco Use   Smoking status: Every Day    Packs/day: 1.00    Years: 15.00    Pack years: 15.00    Types: Cigarettes   Smokeless tobacco: Never  Vaping Use   Vaping Use: Never used  Substance and Sexual Activity   Alcohol use: Not Currently   Drug use: Yes    Frequency: 7.0 times per week    Types: Marijuana    Comment: Daily use; last use is 03/26/2019   Sexual activity: Never    Birth control/protection: Condom  Other Topics Concern   Not on file  Social History Narrative   Pt stated that he is currently living with his brother.  He lives in [place] and is unemployed.  Pt stated that he recently arranged an appointment with [facility].   Social Determinants of Health   Financial Resource Strain: Not on file  Food Insecurity: Not on file  Transportation Needs: Not on file  Physical Activity: Not on file  Stress: Not on file  Social Connections: Not on file   Additional Social History:                         Sleep: Good  Appetite:  Good  Current Medications: Current Facility-Administered Medications  Medication Dose Route Frequency Provider Last Rate Last Admin   acetaminophen (TYLENOL) tablet 650  mg  650 mg Oral Q6H PRN Clapacs, John T, MD   650 mg at 05/06/21 0741   alum & mag hydroxide-simeth (MAALOX/MYLANTA) 200-200-20 MG/5ML suspension 30 mL  30 mL Oral Q4H PRN Clapacs, John T, MD       amLODipine (NORVASC) tablet 5 mg  5 mg Oral Daily Jesse Sans, MD   5 mg at 05/15/21 2956   benztropine (COGENTIN) tablet 1 mg  1 mg Oral QHS Jesse Sans, MD   1 mg at 05/14/21 2125   feeding supplement (ENSURE ENLIVE / ENSURE PLUS) liquid 237 mL  237 mL Oral TID BM Jesse Sans, MD   237 mL at 05/15/21 0815   hydrALAZINE (APRESOLINE) tablet 10 mg  10 mg Oral Q6H PRN Jesse Sans, MD   10 mg at 05/08/21 2130   hydrOXYzine (ATARAX/VISTARIL) tablet 50 mg  50 mg Oral Q6H PRN Clapacs, Jackquline Denmark, MD   50 mg  at 05/15/21 0812   lithium carbonate (LITHOBID) CR tablet 300 mg  300 mg Oral Q12H Jesse Sans, MD   300 mg at 05/15/21 8657   magnesium hydroxide (MILK OF MAGNESIA) suspension 30 mL  30 mL Oral Daily PRN Clapacs, Jackquline Denmark, MD       nicotine (NICODERM CQ - dosed in mg/24 hours) patch 14 mg  14 mg Transdermal Daily Clapacs, Jackquline Denmark, MD   14 mg at 05/14/21 1849   OLANZapine (ZYPREXA) tablet 10 mg  10 mg Oral Q6H PRN Jesse Sans, MD   10 mg at 05/14/21 1623   propranolol (INDERAL) tablet 20 mg  20 mg Oral BID Jesse Sans, MD   20 mg at 05/15/21 8469   risperiDONE (RISPERDAL) tablet 6 mg  6 mg Oral QHS Clapacs, John T, MD   6 mg at 05/14/21 2125   temazepam (RESTORIL) capsule 15 mg  15 mg Oral QHS PRN Clapacs, Jackquline Denmark, MD   15 mg at 05/14/21 2124   ziprasidone (GEODON) injection 20 mg  20 mg Intramuscular Q6H PRN Jesse Sans, MD        Lab Results:  Results for orders placed or performed during the hospital encounter of 05/03/21 (from the past 48 hour(s))  Lithium level     Status: None   Collection Time: 05/15/21  6:29 AM  Result Value Ref Range   Lithium Lvl 0.68 0.60 - 1.20 mmol/L    Comment: Performed at [facility], [address] [place], [place] [address]    Blood Alcohol level:  Lab Results  Component Value Date   [facility] <10 03/26/2019   ETH <11 10/11/2011    Metabolic Disorder Labs: Lab Results  Component Value Date   HGBA1C 5.0 05/05/2021   MPG 96.8 05/05/2021   No results found for: PROLACTIN Lab Results  Component Value Date   CHOL 202 (H) 05/05/2021   TRIG 83 05/05/2021   HDL 35 (L) 05/05/2021   CHOLHDL 5.8 05/05/2021   VLDL 17 05/05/2021   LDLCALC 150 (H) 05/05/2021    Physical Findings: AIMS:  , ,  ,  ,    CIWA:    COWS:     Musculoskeletal: Strength & Muscle Tone: increased Gait & Station: normal Patient leans: N/A  Psychiatric Specialty Exam:  Presentation  General Appearance: Appropriate for Environment; Fairly Groomed;  Neat  Eye Contact:Fair  Speech:Pressured  Speech Volume:Normal  Handedness:Right   Mood and Affect  Mood:Anxious  Affect:Appropriate; Congruent   Thought Process  Thought Processes:Coherent  Descriptions of Associations:Intact  Orientation:Full (Time, Place and Person)  Thought Content:Logical; Abstract Reasoning  History of Schizophrenia/Schizoaffective disorder:No  Duration of Psychotic Symptoms:Less than six months  Hallucinations:Hallucinations: Visual  Ideas of Reference:None  Suicidal Thoughts:Suicidal Thoughts: No  Homicidal Thoughts:Homicidal Thoughts: No   Sensorium  Memory:Immediate Good; Recent Good; Remote Poor; Remote Good  Judgment:Fair  Insight:Fair   Executive Functions  Concentration:Fair  Attention Span:Fair  Recall:Fair  Fund of Knowledge:Fair  Language:Fair   Psychomotor Activity  Psychomotor Activity:Psychomotor Activity: Normal AIMS Completed?: No   Assets  Assets:Communication Skills; Desire for Improvement; Financial Resources/Insurance; Housing; Social Support   Sleep  Sleep:Sleep: Fair    Physical Exam: Physical Exam Constitutional:      Appearance: Normal appearance. He is normal weight.  HENT:     Head: Normocephalic and atraumatic.     Nose: Nose normal.     Mouth/Throat:     Mouth: Mucous membranes are moist.     Pharynx: Oropharynx is clear.  Eyes:     Extraocular Movements: Extraocular movements intact.     Conjunctiva/sclera: Conjunctivae normal.     Pupils: Pupils are equal, round, and reactive to light.  Cardiovascular:     Rate and Rhythm: Normal rate and regular rhythm.     Pulses: Normal pulses.     Heart sounds: Normal heart sounds.  Pulmonary:     Effort: Pulmonary effort is normal.     Breath sounds: Normal breath sounds.  Abdominal:     General: Abdomen is flat. Bowel sounds are normal.     Palpations: Abdomen is soft.  Musculoskeletal:        General: Normal range of motion.      Cervical back: Normal range of motion and neck supple.  Skin:    General: Skin is warm and dry.  Neurological:     General: No focal deficit present.     Mental Status: He is alert.  Psychiatric:        Behavior: Behavior normal.   Review of Systems  Constitutional: Negative.   HENT: Negative.    Eyes: Negative.   Respiratory: Negative.    Cardiovascular: Negative.   Gastrointestinal: Negative.   Genitourinary: Negative.   Musculoskeletal: Negative.   Skin: Negative.   Neurological: Negative.   Endo/Heme/Allergies: Negative.   Psychiatric/Behavioral:  Positive for hallucinations. The patient is nervous/anxious.   Blood pressure 101/81, pulse 81, temperature (!) 97.3 F (36.3 C), temperature source Oral, resp. rate 18, height 6\' 1"  (1.854 m), weight 67.6 kg, SpO2 99 %. Body mass index is 19.66 kg/m.   Treatment Plan Summary: Daily contact with patient to assess and evaluate symptoms and progress in treatment, Medication management, and Plan : Increase lithium and cogentin dose  Elnor Renovato 05/15/2021, 11:41 AM

## 2021-05-16 NOTE — Progress Notes (Signed)
Patient compliant with medication denies SI/HI/A/VH and verbally contracted for safety. No adverse drug noted. Patient stays in his room most of the time. Support and encouragement provided as needed. Will continue to monitor.

## 2021-05-16 NOTE — Progress Notes (Signed)
[facility] MD Progress Note  05/16/2021 7:26 PM Nicholas Nielsen  MRN:  [id number] Subjective: Follow-up 40 year old man with bipolar disorder.  Continues to be very withdrawn.  Stays in his room most of the time.  Little interaction.  On interview however says he is feeling "better".  Denies any suicidal or homicidal thought.  Admits to continuing to have some hallucinations.  Minimal side effects or complaints.  Working with full team and trying to get out of the room a little more. Principal Problem: Bipolar affective disorder, currently manic, severe, with psychotic features (HCC) Diagnosis: Principal Problem:   Bipolar affective disorder, currently manic, severe, with psychotic features (HCC) Active Problems:   Cannabis use disorder, moderate, dependence (HCC)  Total Time spent with patient: 30 minutes  Past Psychiatric History: History of psychotic disorder  Past Medical History:  Past Medical History:  Diagnosis Date   Back pain    Bipolar 1 disorder (HCC)    Hepatitis C    Kidney stones    Schizo affective schizophrenia (HCC)    Testicular cyst    History reviewed. No pertinent surgical history. Family History:  Family History  Problem Relation Age of Onset   Stroke Father    Hypertension Father    Family Psychiatric  History: See previous Social History:  Social History   Substance and Sexual Activity  Alcohol Use Not Currently     Social History   Substance and Sexual Activity  Drug Use Yes   Frequency: 7.0 times per week   Types: Marijuana   Comment: Daily use; last use is 03/26/2019    Social History   Socioeconomic History   Marital status: Single    Spouse name: Not on file   Number of children: Not on file   Years of education: Not on file   Highest education level: Not on file  Occupational History   Occupation: Unemployed  Tobacco Use   Smoking status: Every Day    Packs/day: 1.00    Years: 15.00    Pack years: 15.00    Types: Cigarettes    Smokeless tobacco: Never  Vaping Use   Vaping Use: Never used  Substance and Sexual Activity   Alcohol use: Not Currently   Drug use: Yes    Frequency: 7.0 times per week    Types: Marijuana    Comment: Daily use; last use is 03/26/2019   Sexual activity: Never    Birth control/protection: Condom  Other Topics Concern   Not on file  Social History Narrative   Pt stated that he is currently living with his brother.  He lives in [place] and is unemployed.  Pt stated that he recently arranged an appointment with [facility].   Social Determinants of Health   Financial Resource Strain: Not on file  Food Insecurity: Not on file  Transportation Needs: Not on file  Physical Activity: Not on file  Stress: Not on file  Social Connections: Not on file   Additional Social History:                         Sleep: Fair  Appetite:  Fair  Current Medications: Current Facility-Administered Medications  Medication Dose Route Frequency Provider Last Rate Last Admin   acetaminophen (TYLENOL) tablet 650 mg  650 mg Oral Q6H PRN Alcide Memoli, Jackquline Denmark, MD   650 mg at 05/06/21 0741   alum & mag hydroxide-simeth (MAALOX/MYLANTA) 200-200-20 MG/5ML suspension 30 mL  30 mL Oral Q4H  PRN Claudean Leavelle, Jackquline Denmark, MD       amLODipine (NORVASC) tablet 5 mg  5 mg Oral Daily Les Pou M, MD   5 mg at 05/16/21 0759   benztropine (COGENTIN) tablet 1 mg  1 mg Oral BID Danelle Earthly, Kashif   1 mg at 05/16/21 1707   feeding supplement (ENSURE ENLIVE / ENSURE PLUS) liquid 237 mL  237 mL Oral TID BM Jesse Sans, MD   237 mL at 05/16/21 1404   hydrALAZINE (APRESOLINE) tablet 10 mg  10 mg Oral Q6H PRN Jesse Sans, MD   10 mg at 05/08/21 1941   hydrOXYzine (ATARAX/VISTARIL) tablet 50 mg  50 mg Oral Q6H PRN Corayma Cashatt T, MD   50 mg at 05/16/21 0801   lithium carbonate (ESKALITH) CR tablet 450 mg  450 mg Oral Q12H Malik, Kashif   450 mg at 05/16/21 0759   magnesium hydroxide (MILK OF MAGNESIA) suspension 30 mL  30  mL Oral Daily PRN Breeanna Galgano, Jackquline Denmark, MD       nicotine (NICODERM CQ - dosed in mg/24 hours) patch 14 mg  14 mg Transdermal Daily Anastashia Westerfeld T, MD   14 mg at 05/16/21 0802   OLANZapine (ZYPREXA) tablet 10 mg  10 mg Oral Q6H PRN Jesse Sans, MD   10 mg at 05/16/21 0801   propranolol (INDERAL) tablet 20 mg  20 mg Oral BID Jesse Sans, MD   20 mg at 05/16/21 1706   risperiDONE (RISPERDAL) tablet 6 mg  6 mg Oral QHS Skyelynn Rambeau T, MD   6 mg at 05/15/21 2239   temazepam (RESTORIL) capsule 15 mg  15 mg Oral QHS PRN Kashena Novitski, Jackquline Denmark, MD   15 mg at 05/15/21 2239   ziprasidone (GEODON) injection 20 mg  20 mg Intramuscular Q6H PRN Jesse Sans, MD        Lab Results:  Results for orders placed or performed during the hospital encounter of 05/03/21 (from the past 48 hour(s))  Lithium level     Status: None   Collection Time: 05/15/21  6:29 AM  Result Value Ref Range   Lithium Lvl 0.68 0.60 - 1.20 mmol/L    Comment: Performed at [facility], [address] [place], [place] [address]    Blood Alcohol level:  Lab Results  Component Value Date   [facility] <10 03/26/2019   ETH <11 10/11/2011    Metabolic Disorder Labs: Lab Results  Component Value Date   HGBA1C 5.0 05/05/2021   MPG 96.8 05/05/2021   No results found for: PROLACTIN Lab Results  Component Value Date   CHOL 202 (H) 05/05/2021   TRIG 83 05/05/2021   HDL 35 (L) 05/05/2021   CHOLHDL 5.8 05/05/2021   VLDL 17 05/05/2021   LDLCALC 150 (H) 05/05/2021    Physical Findings: AIMS:  , ,  ,  ,    CIWA:    COWS:     Musculoskeletal: Strength & Muscle Tone: within normal limits Gait & Station: normal Patient leans: N/A  Psychiatric Specialty Exam:  Presentation  General Appearance: Appropriate for Environment; Fairly Groomed; Neat  Eye Contact:Fair  Speech:Pressured  Speech Volume:Normal  Handedness:Right   Mood and Affect  Mood:Anxious  Affect:Appropriate; Congruent   Thought Process   Thought Processes:Coherent  Descriptions of Associations:Intact  Orientation:Full (Time, Place and Person)  Thought Content:Logical; Abstract Reasoning  History of Schizophrenia/Schizoaffective disorder:No  Duration of Psychotic Symptoms:Less than six months  Hallucinations:Hallucinations: Visual  Ideas of  Reference:None  Suicidal Thoughts:Suicidal Thoughts: No  Homicidal Thoughts:Homicidal Thoughts: No   Sensorium  Memory:Immediate Good; Recent Good; Remote Poor; Remote Good  Judgment:Fair  Insight:Fair   Executive Functions  Concentration:Fair  Attention Span:Fair  Recall:Fair  Fund of Knowledge:Fair  Language:Fair   Psychomotor Activity  Psychomotor Activity:Psychomotor Activity: Normal AIMS Completed?: No   Assets  Assets:Communication Skills; Desire for Improvement; Financial Resources/Insurance; Housing; Social Support   Sleep  Sleep:Sleep: Fair    Physical Exam: Physical Exam Vitals and nursing note reviewed.  Constitutional:      Appearance: Normal appearance.  HENT:     Head: Normocephalic and atraumatic.     Mouth/Throat:     Pharynx: Oropharynx is clear.  Eyes:     Pupils: Pupils are equal, round, and reactive to light.  Cardiovascular:     Rate and Rhythm: Normal rate and regular rhythm.  Pulmonary:     Effort: Pulmonary effort is normal.     Breath sounds: Normal breath sounds.  Abdominal:     General: Abdomen is flat.     Palpations: Abdomen is soft.  Musculoskeletal:        General: Normal range of motion.  Skin:    General: Skin is warm and dry.  Neurological:     General: No focal deficit present.     Mental Status: He is alert. Mental status is at baseline.  Psychiatric:        Attention and Perception: Attention normal. He perceives auditory hallucinations.        Mood and Affect: Mood normal. Affect is blunt.        Speech: Speech is delayed.        Behavior: Behavior is slowed.        Thought Content: Thought  content is paranoid.   Review of Systems  Constitutional: Negative.   HENT: Negative.    Eyes: Negative.   Respiratory: Negative.    Cardiovascular: Negative.   Gastrointestinal: Negative.   Musculoskeletal: Negative.   Skin: Negative.   Neurological: Negative.   Psychiatric/Behavioral:  Positive for hallucinations. The patient is nervous/anxious.   Blood pressure 107/87, pulse 70, temperature 98 F (36.7 C), temperature source Oral, resp. rate 18, height 6\' 1"  (1.854 m), weight 67.6 kg, SpO2 97 %. Body mass index is 19.66 kg/m.   Treatment Plan Summary: Plan continue current mood stabilizers and antipsychotics.  Showing gradual improvement.  Nursing staff and treatment team are working to try and get him more compliant with groups and able to be around people.  May be getting closer to discharge possibly with by in the next 3 to 5 days  , MD 05/16/2021, 7:26 PM

## 2021-05-16 NOTE — Group Note (Signed)
[facility] LCSW Group Therapy Note    Group Date: 05/16/2021 Start Time: 1300 End Time: 1400  Type of Therapy and Topic:  Group Therapy:  Overcoming Obstacles  Participation Level:  [facility] PARTICIPATION LEVEL: Did Not Attend  Mood:  Description of Group:   In this group patients will be encouraged to explore what they see as obstacles to their own wellness and recovery. They will be guided to discuss their thoughts, feelings, and behaviors related to these obstacles. The group will process together ways to cope with barriers, with attention given to specific choices patients can make. Each patient will be challenged to identify changes they are motivated to make in order to overcome their obstacles. This group will be process-oriented, with patients participating in exploration of their own experiences as well as giving and receiving support and challenge from other group members.  Therapeutic Goals: 1. Patient will identify personal and current obstacles as they relate to admission. 2. Patient will identify barriers that currently interfere with their wellness or overcoming obstacles.  3. Patient will identify feelings, thought process and behaviors related to these barriers. 4. Patient will identify two changes they are willing to make to overcome these obstacles:    Summary of Patient Progress   X   Therapeutic Modalities:   Cognitive Behavioral Therapy Solution Focused Therapy Motivational Interviewing Relapse Prevention Therapy   Harden Mo, LCSW

## 2021-05-16 NOTE — Progress Notes (Signed)
Condition unchanged, affect is flat brightens upon approach, denies SI/HI/AVH interacting appropriately with peers and staff. He was complaint with medication regimen, 15 minutes safety checks maintained, will continue to monitor.

## 2021-05-16 NOTE — Progress Notes (Signed)
Patient awake and alert this am for breakfast. Presents to medication window for scheduled medications. Denies SI/HI/VH. Endorse auditory hallucinations throughout the day. Denies hearing voices at present. States, I don't hear any voices right now." When RN continued to further assess auditory hallucinations patient denied command hallucinations. States, "they don't say anything specific. I just hear them. Rates anxiety 5/10 and depression 4/10. Says goal is to go to group today.  Labs and vitals monitored and patient supported emotionally and encouraged to verbalize concerns.   Will cont to monitor.

## 2021-05-17 MED ORDER — RISPERIDONE 1 MG PO TABS
2.0000 mg | ORAL_TABLET | ORAL | Status: DC
  Administered 2021-05-17 – 2021-05-18 (×2): 2 mg via ORAL
  Filled 2021-05-17 (×2): qty 2

## 2021-05-17 NOTE — Group Note (Signed)
[facility] LCSW Group Therapy Note   Group Date: 05/17/2021 Start Time: 1310 End Time: 1420  Type of Therapy/Topic:  Group Therapy:  Feelings about Diagnosis  Participation Level:  Did Not Attend      Description of Group:    This group will allow patients to explore their thoughts and feelings about diagnoses they have received. Patients will be guided to explore their level of understanding and acceptance of these diagnoses. Facilitator will encourage patients to process their thoughts and feelings about the reactions of others to their diagnosis, and will guide patients in identifying ways to discuss their diagnosis with significant others in their lives. This group will be process-oriented, with patients participating in exploration of their own experiences as well as giving and receiving support and challenge from other group members.   Therapeutic Goals: 1. Patient will demonstrate understanding of diagnosis as evidence by identifying two or more symptoms of the disorder:  2. Patient will be able to express two feelings regarding the diagnosis 3. Patient will demonstrate ability to communicate their needs through discussion and/or role plays  Summary of Patient Progress:    x    Therapeutic Modalities:   Cognitive Behavioral Therapy Brief Therapy Feelings Identification    Peachie Barkalow F Bexton Haak, Student-Social Work 

## 2021-05-17 NOTE — Progress Notes (Signed)
Patient awake and alert this am. Out for breakfast and lunch. Pt less isolative today and observed ambulating in the hallway in between meals.  Endorsed auditory and visual hallucinations during am medication pass. States, "I don't hear any voices right now or see any visions but I do see them when I'm in my room." When RN inquired further about hallucinations, patient replied, " I just hear the voices and see things on the wall. But not now." Patient encouraged to go to group today and interact with peers. Denies SI/HI and pain. Takes medications without any problems. No adverse effects noted. Cont to monitor for safety.

## 2021-05-17 NOTE — Progress Notes (Signed)
Patient presents with paranoia, isolative to his room but did come out for snack and medications. Patient denies SI/HI. Pt endorses AVH but denies it at this current time. Pt compliant with medication administration per MD orders. Pt given education, support, and encouragement to be active in his treatment plan. Pt being monitored Q 15 minutes for safety per unit protocol. Pt remains safe on the unit.

## 2021-05-17 NOTE — Plan of Care (Signed)
  Problem: Education: Goal: Knowledge of [facility] Education information/materials will improve Outcome: Progressing Goal: Emotional status will improve Outcome: Progressing Goal: Mental status will improve Outcome: Progressing Goal: Verbalization of understanding the information provided will improve Outcome: Progressing   Problem: Activity: Goal: Interest or engagement in activities will improve Outcome: Progressing Goal: Sleeping patterns will improve Outcome: Progressing   Problem: Coping: Goal: Ability to verbalize frustrations and anger appropriately will improve Outcome: Progressing Goal: Ability to demonstrate self-control will improve Outcome: Progressing   Problem: Physical Regulation: Goal: Ability to maintain clinical measurements within normal limits will improve Outcome: Progressing   Problem: Safety: Goal: Periods of time without injury will increase Outcome: Progressing   Problem: Education: Goal: Ability to state activities that reduce stress will improve Outcome: Progressing   Problem: Coping: Goal: Ability to identify and develop effective coping behavior will improve Outcome: Progressing   Problem: Self-Concept: Goal: Ability to identify factors that promote anxiety will improve Outcome: Progressing Goal: Level of anxiety will decrease Outcome: Progressing Goal: Ability to modify response to factors that promote anxiety will improve Outcome: Progressing   Problem: Education: Goal: Will be free of psychotic symptoms Outcome: Progressing Goal: Knowledge of the prescribed therapeutic regimen will improve Outcome: Progressing

## 2021-05-17 NOTE — Progress Notes (Signed)
Recreation Therapy Notes  Date: 05/17/2021  Time: 10:00 am      Location:  Craft room    Behavioral response: N/A   Intervention Topic: Decision Making    Discussion/Intervention: Patient did not attend group.   Clinical Observations/Feedback:  Patient did not attend group.   Kern Gingras LRT/CTRS          Nick Armel 05/17/2021 11:58 AM

## 2021-05-17 NOTE — Progress Notes (Signed)
[facility] MD Progress Note  05/17/2021 11:03 AM Nicholas Nielsen  MRN:  [id number] Subjective: Follow-up for this patient with bipolar disorder with psychosis.  Patient seen and chart reviewed.  Spoke with treatment team today.  Patient continues to endorse especially to nursing staff that he is having auditory hallucinations and occasionally minor visual hallucinations.  He admitted this to me as well but still says he thinks he is feeling "better".  He says he is feeling less nervous and frightened.  He still feels worried about coming out around other people but because our census is very low thinks he might be able to do it today.  Denies any side effects at all from his medication including the full dose Risperdal.  No change in any side effects since lithium was increased.  Patient used to have a complaint of feeling like he had chest pain sometimes during the day but says he thinks that has gotten better as well Principal Problem: Bipolar affective disorder, currently manic, severe, with psychotic features (HCC) Diagnosis: Principal Problem:   Bipolar affective disorder, currently manic, severe, with psychotic features (HCC) Active Problems:   Cannabis use disorder, moderate, dependence (HCC)  Total Time spent with patient: 30 minutes  Past Psychiatric History: Past history of psychotic disorder  Past Medical History:  Past Medical History:  Diagnosis Date   Back pain    Bipolar 1 disorder (HCC)    Hepatitis C    Kidney stones    Schizo affective schizophrenia (HCC)    Testicular cyst    History reviewed. No pertinent surgical history. Family History:  Family History  Problem Relation Age of Onset   Stroke Father    Hypertension Father    Family Psychiatric  History: See previous Social History:  Social History   Substance and Sexual Activity  Alcohol Use Not Currently     Social History   Substance and Sexual Activity  Drug Use Yes   Frequency: 7.0 times per week   Types:  Marijuana   Comment: Daily use; last use is 03/26/2019    Social History   Socioeconomic History   Marital status: Single    Spouse name: Not on file   Number of children: Not on file   Years of education: Not on file   Highest education level: Not on file  Occupational History   Occupation: Unemployed  Tobacco Use   Smoking status: Every Day    Packs/day: 1.00    Years: 15.00    Pack years: 15.00    Types: Cigarettes   Smokeless tobacco: Never  Vaping Use   Vaping Use: Never used  Substance and Sexual Activity   Alcohol use: Not Currently   Drug use: Yes    Frequency: 7.0 times per week    Types: Marijuana    Comment: Daily use; last use is 03/26/2019   Sexual activity: Never    Birth control/protection: Condom  Other Topics Concern   Not on file  Social History Narrative   Pt stated that he is currently living with his brother.  He lives in [place] and is unemployed.  Pt stated that he recently arranged an appointment with [facility].   Social Determinants of Health   Financial Resource Strain: Not on file  Food Insecurity: Not on file  Transportation Needs: Not on file  Physical Activity: Not on file  Stress: Not on file  Social Connections: Not on file   Additional Social History:  Sleep: Fair  Appetite:  Fair  Current Medications: Current Facility-Administered Medications  Medication Dose Route Frequency Provider Last Rate Last Admin   acetaminophen (TYLENOL) tablet 650 mg  650 mg Oral Q6H PRN Ciara Kagan T, MD   650 mg at 05/06/21 0741   alum & mag hydroxide-simeth (MAALOX/MYLANTA) 200-200-20 MG/5ML suspension 30 mL  30 mL Oral Q4H PRN Don Giarrusso T, MD       amLODipine (NORVASC) tablet 5 mg  5 mg Oral Daily Les Pou M, MD   5 mg at 05/17/21 0807   benztropine (COGENTIN) tablet 1 mg  1 mg Oral BID Malik, Kashif   1 mg at 05/17/21 0807   feeding supplement (ENSURE ENLIVE / ENSURE PLUS) liquid 237 mL  237 mL Oral  TID BM Jesse Sans, MD   237 mL at 05/16/21 2047   hydrALAZINE (APRESOLINE) tablet 10 mg  10 mg Oral Q6H PRN Jesse Sans, MD   10 mg at 05/08/21 8144   hydrOXYzine (ATARAX/VISTARIL) tablet 50 mg  50 mg Oral Q6H PRN Glender Augusta T, MD   50 mg at 05/17/21 0807   lithium carbonate (ESKALITH) CR tablet 450 mg  450 mg Oral Q12H Malik, Kashif   450 mg at 05/17/21 8185   magnesium hydroxide (MILK OF MAGNESIA) suspension 30 mL  30 mL Oral Daily PRN Rahm Minix, Jackquline Denmark, MD       nicotine (NICODERM CQ - dosed in mg/24 hours) patch 14 mg  14 mg Transdermal Daily Desarae Placide T, MD   14 mg at 05/17/21 0807   OLANZapine (ZYPREXA) tablet 10 mg  10 mg Oral Q6H PRN Jesse Sans, MD   10 mg at 05/17/21 0806   propranolol (INDERAL) tablet 20 mg  20 mg Oral BID Jesse Sans, MD   20 mg at 05/17/21 6314   risperiDONE (RISPERDAL) tablet 2 mg  2 mg Oral BH-q7a Panhia Karl T, MD       risperiDONE (RISPERDAL) tablet 6 mg  6 mg Oral QHS Rudy Domek T, MD   6 mg at 05/16/21 2048   temazepam (RESTORIL) capsule 15 mg  15 mg Oral QHS PRN Junice Fei, Jackquline Denmark, MD   15 mg at 05/16/21 2048   ziprasidone (GEODON) injection 20 mg  20 mg Intramuscular Q6H PRN Jesse Sans, MD        Lab Results: No results found for this or any previous visit (from the past 48 hour(s)).  Blood Alcohol level:  Lab Results  Component Value Date   [facility] <10 03/26/2019   ETH <11 10/11/2011    Metabolic Disorder Labs: Lab Results  Component Value Date   HGBA1C 5.0 05/05/2021   MPG 96.8 05/05/2021   No results found for: PROLACTIN Lab Results  Component Value Date   CHOL 202 (H) 05/05/2021   TRIG 83 05/05/2021   HDL 35 (L) 05/05/2021   CHOLHDL 5.8 05/05/2021   VLDL 17 05/05/2021   LDLCALC 150 (H) 05/05/2021    Physical Findings: AIMS:  , ,  ,  ,    CIWA:    COWS:     Musculoskeletal: Strength & Muscle Tone: within normal limits Gait & Station: normal Patient leans: N/A  Psychiatric Specialty  Exam:  Presentation  General Appearance: Appropriate for Environment; Fairly Groomed; Neat  Eye Contact:Fair  Speech:Pressured  Speech Volume:Normal  Handedness:Right   Mood and Affect  Mood:Anxious  Affect:Appropriate; Congruent   Thought Process  Thought Processes:Coherent  Descriptions of Associations:Intact  Orientation:Full (Time, Place and Person)  Thought Content:Logical; Abstract Reasoning  History of Schizophrenia/Schizoaffective disorder:No  Duration of Psychotic Symptoms:Less than six months  Hallucinations:No data recorded Ideas of Reference:None  Suicidal Thoughts:No data recorded Homicidal Thoughts:No data recorded  Sensorium  Memory:Immediate Good; Recent Good; Remote Poor; Remote Good  Judgment:Fair  Insight:Fair   Executive Functions  Concentration:Fair  Attention Span:Fair  Recall:Fair  Fund of Knowledge:Fair  Language:Fair   Psychomotor Activity  Psychomotor Activity:No data recorded  Assets  Assets:Communication Skills; Desire for Improvement; Financial Resources/Insurance; Housing; Social Support   Sleep  Sleep:No data recorded   Physical Exam: Physical Exam Vitals and nursing note reviewed.  Constitutional:      Appearance: Normal appearance.  HENT:     Head: Normocephalic and atraumatic.     Mouth/Throat:     Pharynx: Oropharynx is clear.  Eyes:     Pupils: Pupils are equal, round, and reactive to light.  Cardiovascular:     Rate and Rhythm: Normal rate and regular rhythm.  Pulmonary:     Effort: Pulmonary effort is normal.     Breath sounds: Normal breath sounds.  Abdominal:     General: Abdomen is flat.     Palpations: Abdomen is soft.  Musculoskeletal:        General: Normal range of motion.  Skin:    General: Skin is warm and dry.  Neurological:     General: No focal deficit present.     Mental Status: He is alert. Mental status is at baseline.  Psychiatric:        Attention and Perception:  Attention normal. He perceives auditory hallucinations.        Mood and Affect: Mood normal. Affect is flat.        Speech: Speech is delayed.        Behavior: Behavior is slowed.        Thought Content: Thought content normal.        Cognition and Memory: Cognition normal.        Judgment: Judgment normal.   Review of Systems  Constitutional: Negative.   HENT: Negative.    Eyes: Negative.   Respiratory: Negative.    Cardiovascular: Negative.   Gastrointestinal: Negative.   Musculoskeletal: Negative.   Skin: Negative.   Neurological: Negative.   Psychiatric/Behavioral:  Positive for hallucinations. Negative for suicidal ideas. The patient does not have insomnia.   Blood pressure 102/66, pulse 62, temperature 98 F (36.7 C), temperature source Oral, resp. rate 18, height 6\' 1"  (1.854 m), weight 67.6 kg, SpO2 97 %. Body mass index is 19.66 kg/m.   Treatment Plan Summary: Medication management and Plan because of persistent psychotic symptoms and his complete tolerance of 6 mg of Risperdal I will recommend adding some Risperdal 2 mg in the morning as well.  Increase in lithium dose was just within the appropriate range given his last reading.  No other change to medicine today.  Continue to encourage some attempts at group engagement.  Patient is still having some psychotic symptoms but mood and overall functioning are much improved.  , MD 05/17/2021, 11:03 AM

## 2021-05-18 DIAGNOSIS — F312 Bipolar disorder, current episode manic severe with psychotic features: Secondary | ICD-10-CM | POA: Diagnosis not present

## 2021-05-18 MED ORDER — RISPERIDONE 1 MG PO TABS
3.0000 mg | ORAL_TABLET | ORAL | Status: DC
  Administered 2021-05-19: 08:00:00 3 mg via ORAL
  Filled 2021-05-18: qty 3

## 2021-05-18 NOTE — Progress Notes (Signed)
Patient awake and alert this am eating breakfast. Prompted to med room to take medications. Endorses AH and VH. Per patient the patient the voices occasionally tell him to act out but he works hard not to Patient states, "I try my best to fight them in my head." Informs RN that he is able to manage the voices throughout the day. However they become more difficult to manage later in the day.When RN asked patient to specify the time of day it was most difficult to manage. Patient stated, "In the evening, right before dinner." RN requested  that patient inform her when this happens so that she can offer support. Patient agreed to communicate to RN about worsening symptoms. Cont Q15 minute check for safety.

## 2021-05-18 NOTE — Progress Notes (Signed)
Recreation Therapy Notes  Date: 05/18/2021  Time: 10:00 am      Location:  Craft room    Behavioral response: N/A   Intervention Topic: Stress Management    Discussion/Intervention: Patient did not attend group.   Clinical Observations/Feedback:  Patient did not attend group.   Albertia Carvin LRT/CTRS          Ayde Record 05/18/2021 11:54 AM

## 2021-05-18 NOTE — Progress Notes (Signed)
Patient presents with paranoia but was more active on the unit tonight than when this writer previously had this patient. Patient denies SI/HI. Pt endorses AVH but denies it at this current time. Pt compliant with medication administration per MD orders. Pt given education, support, and encouragement to be active in his treatment plan. Pt being monitored Q 15 minutes for safety per unit protocol. Pt remains safe on the unit.

## 2021-05-18 NOTE — Progress Notes (Signed)
[facility] MD Progress Note  05/18/2021 5:16 PM EDIN KON  MRN:  [id number] Subjective: Follow-up for this 40 year old man who has what appears to be bipolar disorder and he continues to be psychotic.  Patient seen and chart reviewed.  Patient continues to endorse daily auditory and visual hallucinations and vague feelings of paranoia.  Denies suicidal or homicidal thoughts but still his activity is dominated by paranoia.  He cannot really get out and be around anyone.  He is tolerating medicine without any observable side effects. Principal Problem: Bipolar affective disorder, currently manic, severe, with psychotic features (HCC) Diagnosis: Principal Problem:   Bipolar affective disorder, currently manic, severe, with psychotic features (HCC) Active Problems:   Cannabis use disorder, moderate, dependence (HCC)  Total Time spent with patient: 30 minutes  Past Psychiatric History: Past history of bipolar disorder and cannabis abuse  Past Medical History:  Past Medical History:  Diagnosis Date   Back pain    Bipolar 1 disorder (HCC)    Hepatitis C    Kidney stones    Schizo affective schizophrenia (HCC)    Testicular cyst    History reviewed. No pertinent surgical history. Family History:  Family History  Problem Relation Age of Onset   Stroke Father    Hypertension Father    Family Psychiatric  History: See previous Social History:  Social History   Substance and Sexual Activity  Alcohol Use Not Currently     Social History   Substance and Sexual Activity  Drug Use Yes   Frequency: 7.0 times per week   Types: Marijuana   Comment: Daily use; last use is 03/26/2019    Social History   Socioeconomic History   Marital status: Single    Spouse name: Not on file   Number of children: Not on file   Years of education: Not on file   Highest education level: Not on file  Occupational History   Occupation: Unemployed  Tobacco Use   Smoking status: Every Day     Packs/day: 1.00    Years: 15.00    Pack years: 15.00    Types: Cigarettes   Smokeless tobacco: Never  Vaping Use   Vaping Use: Never used  Substance and Sexual Activity   Alcohol use: Not Currently   Drug use: Yes    Frequency: 7.0 times per week    Types: Marijuana    Comment: Daily use; last use is 03/26/2019   Sexual activity: Never    Birth control/protection: Condom  Other Topics Concern   Not on file  Social History Narrative   Pt stated that he is currently living with his brother.  He lives in [place] and is unemployed.  Pt stated that he recently arranged an appointment with [facility].   Social Determinants of Health   Financial Resource Strain: Not on file  Food Insecurity: Not on file  Transportation Needs: Not on file  Physical Activity: Not on file  Stress: Not on file  Social Connections: Not on file   Additional Social History:                         Sleep: Fair  Appetite:  Fair  Current Medications: Current Facility-Administered Medications  Medication Dose Route Frequency Provider Last Rate Last Admin   acetaminophen (TYLENOL) tablet 650 mg  650 mg Oral Q6H PRN Jamicheal Heard, Jackquline Denmark, MD   650 mg at 05/06/21 0741   alum & mag hydroxide-simeth (MAALOX/MYLANTA) 200-200-20 MG/5ML  suspension 30 mL  30 mL Oral Q4H PRN Ricki Clack T, MD       amLODipine (NORVASC) tablet 5 mg  5 mg Oral Daily Les Pou M, MD   5 mg at 05/18/21 0806   benztropine (COGENTIN) tablet 1 mg  1 mg Oral BID Malik, Kashif   1 mg at 05/18/21 0806   feeding supplement (ENSURE ENLIVE / ENSURE PLUS) liquid 237 mL  237 mL Oral TID BM Jesse Sans, MD   237 mL at 05/18/21 1420   hydrALAZINE (APRESOLINE) tablet 10 mg  10 mg Oral Q6H PRN Jesse Sans, MD   10 mg at 05/08/21 1610   hydrOXYzine (ATARAX/VISTARIL) tablet 50 mg  50 mg Oral Q6H PRN Cammeron Greis T, MD   50 mg at 05/18/21 0808   lithium carbonate (ESKALITH) CR tablet 450 mg  450 mg Oral Q12H Malik, Kashif   450 mg  at 05/18/21 9604   magnesium hydroxide (MILK OF MAGNESIA) suspension 30 mL  30 mL Oral Daily PRN Loxley Schmale, Jackquline Denmark, MD       nicotine (NICODERM CQ - dosed in mg/24 hours) patch 14 mg  14 mg Transdermal Daily Maurya Nethery, Jackquline Denmark, MD   14 mg at 05/18/21 0808   OLANZapine (ZYPREXA) tablet 10 mg  10 mg Oral Q6H PRN Jesse Sans, MD   10 mg at 05/18/21 5409   propranolol (INDERAL) tablet 20 mg  20 mg Oral BID Jesse Sans, MD   20 mg at 05/18/21 0806   [START ON 05/19/2021] risperiDONE (RISPERDAL) tablet 3 mg  3 mg Oral BH-q7a Easter Schinke T, MD       risperiDONE (RISPERDAL) tablet 6 mg  6 mg Oral QHS Hildagarde Holleran T, MD   6 mg at 05/17/21 2106   temazepam (RESTORIL) capsule 15 mg  15 mg Oral QHS PRN Jazzmon Prindle, Jackquline Denmark, MD   15 mg at 05/17/21 2106   ziprasidone (GEODON) injection 20 mg  20 mg Intramuscular Q6H PRN Jesse Sans, MD        Lab Results: No results found for this or any previous visit (from the past 48 hour(s)).  Blood Alcohol level:  Lab Results  Component Value Date   [facility] <10 03/26/2019   ETH <11 10/11/2011    Metabolic Disorder Labs: Lab Results  Component Value Date   HGBA1C 5.0 05/05/2021   MPG 96.8 05/05/2021   No results found for: PROLACTIN Lab Results  Component Value Date   CHOL 202 (H) 05/05/2021   TRIG 83 05/05/2021   HDL 35 (L) 05/05/2021   CHOLHDL 5.8 05/05/2021   VLDL 17 05/05/2021   LDLCALC 150 (H) 05/05/2021    Physical Findings: AIMS:  , ,  ,  ,    CIWA:    COWS:     Musculoskeletal: Strength & Muscle Tone: within normal limits Gait & Station: normal Patient leans: N/A  Psychiatric Specialty Exam:  Presentation  General Appearance: Appropriate for Environment; Fairly Groomed; Neat  Eye Contact:Fair  Speech:Pressured  Speech Volume:Normal  Handedness:Right   Mood and Affect  Mood:Anxious  Affect:Appropriate; Congruent   Thought Process  Thought Processes:Coherent  Descriptions of Associations:Intact  Orientation:Full  (Time, Place and Person)  Thought Content:Logical; Abstract Reasoning  History of Schizophrenia/Schizoaffective disorder:No  Duration of Psychotic Symptoms:Less than six months  Hallucinations:No data recorded Ideas of Reference:None  Suicidal Thoughts:No data recorded Homicidal Thoughts:No data recorded  Sensorium  Memory:Immediate Good; Recent Good; Remote Poor; Remote Good  Judgment:Fair  Insight:Fair   Executive Functions  Concentration:Fair  Attention Span:Fair  Recall:Fair  Fund of Knowledge:Fair  Language:Fair   Psychomotor Activity  Psychomotor Activity:No data recorded  Assets  Assets:Communication Skills; Desire for Improvement; Financial Resources/Insurance; Housing; Social Support   Sleep  Sleep:No data recorded   Physical Exam: Physical Exam Vitals and nursing note reviewed.  Constitutional:      Appearance: Normal appearance.  HENT:     Head: Normocephalic and atraumatic.     Mouth/Throat:     Pharynx: Oropharynx is clear.  Eyes:     Pupils: Pupils are equal, round, and reactive to light.  Cardiovascular:     Rate and Rhythm: Normal rate and regular rhythm.  Pulmonary:     Effort: Pulmonary effort is normal.     Breath sounds: Normal breath sounds.  Abdominal:     General: Abdomen is flat.     Palpations: Abdomen is soft.  Musculoskeletal:        General: Normal range of motion.  Skin:    General: Skin is warm and dry.  Neurological:     General: No focal deficit present.     Mental Status: He is alert. Mental status is at baseline.  Psychiatric:        Attention and Perception: He is inattentive. He perceives auditory hallucinations.        Mood and Affect: Mood normal. Affect is blunt.        Speech: Speech normal.        Behavior: Behavior is slowed.        Thought Content: Thought content is paranoid. Thought content does not include homicidal or suicidal ideation.   Review of Systems  Constitutional: Negative.   HENT:  Negative.    Eyes: Negative.   Respiratory: Negative.    Cardiovascular: Negative.   Gastrointestinal: Negative.   Musculoskeletal: Negative.   Skin: Negative.   Neurological: Negative.   Psychiatric/Behavioral:  Positive for hallucinations. Negative for depression, substance abuse and suicidal ideas. The patient is nervous/anxious.   Blood pressure 122/84, pulse 81, temperature (!) 97.5 F (36.4 C), temperature source Oral, resp. rate 17, height 6\' 1"  (1.854 m), weight 67.6 kg, SpO2 99 %. Body mass index is 19.66 kg/m.   Treatment Plan Summary: Plan patient is already on a fairly high dose of Risperdal without any sign of EPS.  Risperdal will be increased a little bit more with 4 mg during the day.  It is always unclear when we should make a radical change with antipsychotic as it definitely takes time for antipsychotics to work and he is tolerating this 1.  He also appears to be getting the as needed Zyprexa regularly.  No sign of oversedation.  No sign of lithium toxicity.  , MD 05/18/2021, 5:16 PM

## 2021-05-18 NOTE — Group Note (Signed)
[facility] LCSW Group Therapy Note   Group Date: 05/18/2021 Start Time: 1310 End Time: 1415   Type of Therapy/Topic:  Group Therapy:  Emotion Regulation  Participation Level:  Did Not Attend   Mood:  Description of Group:    The purpose of this group is to assist patients in learning to regulate negative emotions and experience positive emotions. Patients will be guided to discuss ways in which they have been vulnerable to their negative emotions. These vulnerabilities will be juxtaposed with experiences of positive emotions or situations, and patients challenged to use positive emotions to combat negative ones. Special emphasis will be placed on coping with negative emotions in conflict situations, and patients will process healthy conflict resolution skills.  Therapeutic Goals: Patient will identify two positive emotions or experiences to reflect on in order to balance out negative emotions:  Patient will label two or more emotions that they find the most difficult to experience:  Patient will be able to demonstrate positive conflict resolution skills through discussion or role plays:   Summary of Patient Progress: X   Therapeutic Modalities:   Cognitive Behavioral Therapy Feelings Identification Dialectical Behavioral Therapy   Deacon Gadbois R Dandra Shambaugh, LCSW 

## 2021-05-18 NOTE — Plan of Care (Addendum)
See progress note or details of patients non progression symptoms Problem: Education: Goal: Knowledge of [facility] Education information/materials will improve 05/18/2021 1813 by Angeline Slim, RN Outcome: Progressing 05/18/2021 1010 by Angeline Slim, RN Outcome: Progressing Goal: Emotional status will improve 05/18/2021 1813 by Angeline Slim, RN Outcome: Progressing 05/18/2021 1010 by Angeline Slim, RN Outcome: Progressing Goal: Mental status will improve 05/18/2021 1813 by Angeline Slim, RN Outcome: Not Progressing  Goal: Verbalization of understanding the information provided will improve 05/18/2021 1813 by Angeline Slim, RN Outcome: Not Progressing   Problem: Education: Goal: Emotional status will improve 05/18/2021 1813 by Angeline Slim, RN Outcome: Progressing 05/18/2021 1010 by Angeline Slim, RN Outcome: Progressing   Problem: Education: Goal: Mental status will improve 05/18/2021 1813 by Angeline Slim, RN Outcome: Not Progressing   Problem: Education: Goal: Verbalization of understanding the information provided will improve 05/18/2021 1813 by Angeline Slim, RN Outcome: Not Progressing   Problem: Activity: Goal: Interest or engagement in activities will improve 05/18/2021 1813 by Angeline Slim, RN Outcome: Not Progressing Goal: Sleeping patterns will improve 05/18/2021 1813 by Angeline Slim, RN Outcome: Progressing 05/18/2021 1010 by Angeline Slim, RN Outcome: Progressing   Problem: Activity: Goal: Sleeping patterns will improve 05/18/2021 1813 by Angeline Slim, RN Outcome: Progressing 05/18/2021 1010 by Angeline Slim, RN Outcome: Progressing   Problem: Coping: Goal: Ability to verbalize frustrations and anger appropriately will improve 05/18/2021 1813 by Angeline Slim, RN Outcome: Progressing 05/18/2021 1010 by Angeline Slim, RN Outcome: Progressing Goal: Ability to  demonstrate self-control will improve 05/18/2021 1813 by Angeline Slim, RN Outcome: Progressing 05/18/2021 1010 by Angeline Slim, RN Outcome: Progressing   Problem: Health Behavior/Discharge Planning: Goal: Identification of resources available to assist in meeting health care needs will improve Outcome: Progressing Goal: Compliance with treatment plan for underlying cause of condition will improve Outcome: Progressing   Problem: Physical Regulation: Goal: Ability to maintain clinical measurements within normal limits will improve Outcome: Progressing   Problem: Safety: Goal: Periods of time without injury will increase Outcome: Progressing   Problem: Education: Goal: Ability to state activities that reduce stress will improve Outcome: Progressing   Problem: Coping: Goal: Ability to identify and develop effective coping behavior will improve Outcome: Progressing   Problem: Role Relationship: Goal: Ability to communicate needs accurately will improve Outcome: Progressing Goal: Ability to interact with others will improve Outcome: Progressing   Problem: Health Behavior/Discharge Planning: Goal: Compliance with prescribed medication regimen will improve Outcome: Progressing   Problem: Coping: Goal: Coping ability will improve Outcome: Progressing Goal: Will verbalize feelings Outcome: Progressing

## 2021-05-19 DIAGNOSIS — F312 Bipolar disorder, current episode manic severe with psychotic features: Secondary | ICD-10-CM | POA: Diagnosis not present

## 2021-05-19 MED ORDER — RISPERIDONE 1 MG PO TABS
4.0000 mg | ORAL_TABLET | ORAL | Status: DC
  Administered 2021-05-20 – 2021-05-23 (×4): 4 mg via ORAL
  Filled 2021-05-19 (×4): qty 4

## 2021-05-19 NOTE — Progress Notes (Signed)
Recreation Therapy Notes  Date: 05/19/2021  Time: 10:30 am      Location:  Craft room    Behavioral response: N/A   Intervention Topic: Creative expressions   Discussion/Intervention: Patient did not attend group.   Clinical Observations/Feedback:  Patient did not attend group.   Hulan Szumski LRT/CTRS        Kalle Bernath 05/19/2021 12:49 PM

## 2021-05-19 NOTE — Plan of Care (Signed)
Patient got up, ate breakfast and received AM medications. Alert and oriented x 5. Denied SI/HI/AVH. Reported that he is feeling better, that medications are working. Patient returned to bed after breakfast. Has no sign of distress. Encouragements and support provided. Safety monitored as expected.

## 2021-05-19 NOTE — Group Note (Signed)
[facility] LCSW Group Therapy Note   Group Date: 05/19/2021 Start Time: 1300 End Time: 1400   Type of Therapy/Topic:  Group Therapy:  Balance in Life  Participation Level:  None   Description of Group:    This group will address the concept of balance and how it feels and looks when one is unbalanced. Patients will be encouraged to process areas in their lives that are out of balance, and identify reasons for remaining unbalanced. Facilitators will guide patients utilizing problem- solving interventions to address and correct the stressor making their life unbalanced. Understanding and applying boundaries will be explored and addressed for obtaining  and maintaining a balanced life. Patients will be encouraged to explore ways to assertively make their unbalanced needs known to significant others in their lives, using other group members and facilitator for support and feedback.  Therapeutic Goals: Patient will identify two or more emotions or situations they have that consume much of in their lives. Patient will identify signs/triggers that life has become out of balance:  Patient will identify two ways to set boundaries in order to achieve balance in their lives:  Patient will demonstrate ability to communicate their needs through discussion and/or role plays  Summary of Patient Progress: Group not held at this time due to the acuity of the unit.     Therapeutic Modalities:   Cognitive Behavioral Therapy Solution-Focused Therapy Assertiveness Training   Nicholas Nielsen Nicholas Charell Faulk, LCSW 

## 2021-05-19 NOTE — BH IP Treatment Plan (Signed)
Interdisciplinary Treatment and Diagnostic Plan Update  05/19/2021 Time of Session: 8:30 AM Nicholas Nielsen MRN: [id number]  Principal Diagnosis: Bipolar affective disorder, currently manic, severe, with psychotic features (HCC)  Secondary Diagnoses: Principal Problem:   Bipolar affective disorder, currently manic, severe, with psychotic features (HCC) Active Problems:   Cannabis use disorder, moderate, dependence (HCC)   Current Medications:  Current Facility-Administered Medications  Medication Dose Route Frequency Provider Last Rate Last Admin   acetaminophen (TYLENOL) tablet 650 mg  650 mg Oral Q6H PRN Clapacs, John T, MD   650 mg at 05/06/21 0741   alum & mag hydroxide-simeth (MAALOX/MYLANTA) 200-200-20 MG/5ML suspension 30 mL  30 mL Oral Q4H PRN Clapacs, John T, MD       amLODipine (NORVASC) tablet 5 mg  5 mg Oral Daily Les Pou M, MD   5 mg at 05/19/21 0814   benztropine (COGENTIN) tablet 1 mg  1 mg Oral BID Malik, Kashif   1 mg at 05/19/21 0815   feeding supplement (ENSURE ENLIVE / ENSURE PLUS) liquid 237 mL  237 mL Oral TID BM Jesse Sans, MD   237 mL at 05/19/21 0951   hydrALAZINE (APRESOLINE) tablet 10 mg  10 mg Oral Q6H PRN Jesse Sans, MD   10 mg at 05/08/21 7829   hydrOXYzine (ATARAX/VISTARIL) tablet 50 mg  50 mg Oral Q6H PRN Clapacs, John T, MD   50 mg at 05/18/21 1721   lithium carbonate (ESKALITH) CR tablet 450 mg  450 mg Oral Q12H Malik, Kashif   450 mg at 05/19/21 0815   magnesium hydroxide (MILK OF MAGNESIA) suspension 30 mL  30 mL Oral Daily PRN Clapacs, Jackquline Denmark, MD       nicotine (NICODERM CQ - dosed in mg/24 hours) patch 14 mg  14 mg Transdermal Daily Clapacs, John T, MD   14 mg at 05/19/21 0814   OLANZapine (ZYPREXA) tablet 10 mg  10 mg Oral Q6H PRN Jesse Sans, MD   10 mg at 05/18/21 1720   propranolol (INDERAL) tablet 20 mg  20 mg Oral BID Jesse Sans, MD   20 mg at 05/19/21 0815   risperiDONE (RISPERDAL) tablet 3 mg  3 mg Oral BH-q7a  Clapacs, Jackquline Denmark, MD   3 mg at 05/19/21 0815   risperiDONE (RISPERDAL) tablet 6 mg  6 mg Oral QHS Clapacs, John T, MD   6 mg at 05/18/21 2113   temazepam (RESTORIL) capsule 15 mg  15 mg Oral QHS PRN Clapacs, John T, MD   15 mg at 05/18/21 2113   ziprasidone (GEODON) injection 20 mg  20 mg Intramuscular Q6H PRN Jesse Sans, MD       PTA Medications: Medications Prior to Admission  Medication Sig Dispense Refill Last Dose   citalopram (CELEXA) 20 MG tablet Take 20 mg by mouth every morning.      HYDROcodone-acetaminophen (NORCO/VICODIN) 5-325 MG tablet Take 1 tablet by mouth every 6 (six) hours as needed for severe pain. (Patient not taking: No sig reported) 10 tablet 0    hydrOXYzine (ATARAX/VISTARIL) 25 MG tablet Take 1 tablet (25 mg total) by mouth every 8 (eight) hours as needed for anxiety. (Patient not taking: Reported on 03/17/2019) 15 tablet 0    hydrOXYzine (ATARAX/VISTARIL) 25 MG tablet Take 1 tablet (25 mg total) by mouth every 6 (six) hours as needed for anxiety. (Patient not taking: No sig reported) 30 tablet 2    ondansetron (ZOFRAN ODT) 4 MG disintegrating tablet  Take 1 tablet (4 mg total) by mouth every 8 (eight) hours as needed for nausea or vomiting. (Patient not taking: Reported on 03/26/2019) 20 tablet 0    ondansetron (ZOFRAN) 4 MG tablet Take 1 tablet (4 mg total) by mouth every 8 (eight) hours as needed for nausea or vomiting. (Patient not taking: Reported on 05/03/2021) 15 tablet 0    sertraline (ZOLOFT) 50 MG tablet Take 1 tablet (50 mg total) by mouth daily. (Patient not taking: Reported on 05/03/2021) 30 tablet 1    traZODone (DESYREL) 50 MG tablet Take 50 mg by mouth at bedtime.       Patient Stressors:    Patient Strengths:    Treatment Modalities: Medication Management, Group therapy, Case management,  1 to 1 session with clinician, Psychoeducation, Recreational therapy.   Physician Treatment Plan for Primary Diagnosis: Bipolar affective disorder, currently  manic, severe, with psychotic features (HCC) Long Term Goal(s): Improvement in symptoms so as ready for discharge   Short Term Goals: Ability to identify changes in lifestyle to reduce recurrence of condition will improve Ability to verbalize feelings will improve Ability to disclose and discuss suicidal ideas Ability to demonstrate self-control will improve Ability to identify and develop effective coping behaviors will improve Ability to maintain clinical measurements within normal limits will improve Compliance with prescribed medications will improve Ability to identify triggers associated with substance abuse/mental health issues will improve  Medication Management: Evaluate patient's response, side effects, and tolerance of medication regimen.  Therapeutic Interventions: 1 to 1 sessions, Unit Group sessions and Medication administration.  Evaluation of Outcomes: Progressing  Physician Treatment Plan for Secondary Diagnosis: Principal Problem:   Bipolar affective disorder, currently manic, severe, with psychotic features (HCC) Active Problems:   Cannabis use disorder, moderate, dependence (HCC)  Long Term Goal(s): Improvement in symptoms so as ready for discharge   Short Term Goals: Ability to identify changes in lifestyle to reduce recurrence of condition will improve Ability to verbalize feelings will improve Ability to disclose and discuss suicidal ideas Ability to demonstrate self-control will improve Ability to identify and develop effective coping behaviors will improve Ability to maintain clinical measurements within normal limits will improve Compliance with prescribed medications will improve Ability to identify triggers associated with substance abuse/mental health issues will improve     Medication Management: Evaluate patient's response, side effects, and tolerance of medication regimen.  Therapeutic Interventions: 1 to 1 sessions, Unit Group sessions and  Medication administration.  Evaluation of Outcomes: Progressing   RN Treatment Plan for Primary Diagnosis: Bipolar affective disorder, currently manic, severe, with psychotic features (HCC) Long Term Goal(s): Knowledge of disease and therapeutic regimen to maintain health will improve  Short Term Goals: Ability to demonstrate self-control, Ability to participate in decision making will improve, Ability to verbalize feelings will improve, Ability to disclose and discuss suicidal ideas, Ability to identify and develop effective coping behaviors will improve, and Compliance with prescribed medications will improve  Medication Management: RN will administer medications as ordered by provider, will assess and evaluate patient's response and provide education to patient for prescribed medication. RN will report any adverse and/or side effects to prescribing provider.  Therapeutic Interventions: 1 on 1 counseling sessions, Psychoeducation, Medication administration, Evaluate responses to treatment, Monitor vital signs and CBGs as ordered, Perform/monitor CIWA, COWS, AIMS and Fall Risk screenings as ordered, Perform wound care treatments as ordered.  Evaluation of Outcomes: Progressing   LCSW Treatment Plan for Primary Diagnosis: Bipolar affective disorder, currently manic, severe, with psychotic features (  HCC) Long Term Goal(s): Safe transition to appropriate next level of care at discharge, Engage patient in therapeutic group addressing interpersonal concerns.  Short Term Goals: Engage patient in aftercare planning with referrals and resources, Increase social support, Increase ability to appropriately verbalize feelings, Increase emotional regulation, Facilitate acceptance of mental health diagnosis and concerns, and Increase skills for wellness and recovery  Therapeutic Interventions: Assess for all discharge needs, 1 to 1 time with Social worker, Explore available resources and support systems,  Assess for adequacy in community support network, Educate family and significant other(s) on suicide prevention, Complete Psychosocial Assessment, Interpersonal group therapy.  Evaluation of Outcomes: Progressing   Progress in Treatment: Attending groups: No. Participating in groups: No. Taking medication as prescribed: Yes. Toleration medication: Yes. Family/Significant other contact made: Yes, individual(s) contacted:  SPE completed with the patient, patient declined collateral contact at this time.  Patient understands diagnosis: Yes. Discussing patient identified problems/goals with staff: Yes. Medical problems stabilized or resolved: Yes. Denies suicidal/homicidal ideation: Yes. Issues/concerns per patient self-inventory: No. Other: none  New problem(s) identified: No, Describe:  none  New Short Term/Long Term Goal(s): detox, elimination of symptoms of psychosis, medication management for mood stabilization; elimination of SI thoughts; development of comprehensive mental wellness/sobriety plan. 05/09/21 Update: No changes at this time. 05/14/21 Update:  No changes at this time. 05/19/2021 Update:  No changes at this time.    Patient Goals:  "Just my medicine." 05/09/21 Update: No changes at this time. 05/14/21 Update:  No changes at this time. 05/19/2021 Update:  No changes at this time.   Discharge Plan or Barriers: CSW will assist with development of an appropriate aftercare/discharge plan. 05/09/21 Update: No changes at this time.05/14/21 Update:  No changes at this time. 05/19/2021 Update:  Patient's medications have been increased to support continued improvement.    Reason for Continuation of Hospitalization: Hallucinations Medication stabilization Suicidal ideation   Estimated Length of Stay: TBD   Scribe for Treatment Team: JERROLD HASKELL, LCSW 05/19/2021 10:15 AM

## 2021-05-19 NOTE — Progress Notes (Signed)
[facility] MD Progress Note  05/19/2021 4:04 PM Nicholas Nielsen  MRN:  [id number] Subjective: Follow-up for this 40 year old man with psychotic disorder.  Came to see him in his room this afternoon and in contrast to other days he was very forthcoming about his symptoms.  He told me that he is day had been okay until about an hour ago at which point he went on a rant indicating that he was still having very powerful positive symptoms.  He talked about hearing the voices yelling in his ear constantly telling him things about himself.  Pointed at various parts of the room indicating that he thought that his picture was being taken by people.  He was not hostile to me but was clearly upset.  Insight is of course only partial.  He has been compliant with medicine and continues to have no real side effects. Principal Problem: Bipolar affective disorder, currently manic, severe, with psychotic features (HCC) Diagnosis: Principal Problem:   Bipolar affective disorder, currently manic, severe, with psychotic features (HCC) Active Problems:   Cannabis use disorder, moderate, dependence (HCC)  Total Time spent with patient: 30 minutes  Past Psychiatric History: History of psychotic disorder  Past Medical History:  Past Medical History:  Diagnosis Date   Back pain    Bipolar 1 disorder (HCC)    Hepatitis C    Kidney stones    Schizo affective schizophrenia (HCC)    Testicular cyst    History reviewed. No pertinent surgical history. Family History:  Family History  Problem Relation Age of Onset   Stroke Father    Hypertension Father    Family Psychiatric  History: See previous Social History:  Social History   Substance and Sexual Activity  Alcohol Use Not Currently     Social History   Substance and Sexual Activity  Drug Use Yes   Frequency: 7.0 times per week   Types: Marijuana   Comment: Daily use; last use is 03/26/2019    Social History   Socioeconomic History   Marital status:  Single    Spouse name: Not on file   Number of children: Not on file   Years of education: Not on file   Highest education level: Not on file  Occupational History   Occupation: Unemployed  Tobacco Use   Smoking status: Every Day    Packs/day: 1.00    Years: 15.00    Pack years: 15.00    Types: Cigarettes   Smokeless tobacco: Never  Vaping Use   Vaping Use: Never used  Substance and Sexual Activity   Alcohol use: Not Currently   Drug use: Yes    Frequency: 7.0 times per week    Types: Marijuana    Comment: Daily use; last use is 03/26/2019   Sexual activity: Never    Birth control/protection: Condom  Other Topics Concern   Not on file  Social History Narrative   Pt stated that he is currently living with his brother.  He lives in [place] and is unemployed.  Pt stated that he recently arranged an appointment with [facility].   Social Determinants of Health   Financial Resource Strain: Not on file  Food Insecurity: Not on file  Transportation Needs: Not on file  Physical Activity: Not on file  Stress: Not on file  Social Connections: Not on file   Additional Social History:  Sleep: Fair  Appetite:  Fair  Current Medications: Current Facility-Administered Medications  Medication Dose Route Frequency Provider Last Rate Last Admin   acetaminophen (TYLENOL) tablet 650 mg  650 mg Oral Q6H PRN Makala Fetterolf T, MD   650 mg at 05/06/21 0741   alum & mag hydroxide-simeth (MAALOX/MYLANTA) 200-200-20 MG/5ML suspension 30 mL  30 mL Oral Q4H PRN Kierstyn Baranowski T, MD       amLODipine (NORVASC) tablet 5 mg  5 mg Oral Daily Les Pou M, MD   5 mg at 05/19/21 0814   benztropine (COGENTIN) tablet 1 mg  1 mg Oral BID Malik, Kashif   1 mg at 05/19/21 0815   feeding supplement (ENSURE ENLIVE / ENSURE PLUS) liquid 237 mL  237 mL Oral TID BM Jesse Sans, MD   237 mL at 05/19/21 0951   hydrALAZINE (APRESOLINE) tablet 10 mg  10 mg Oral Q6H PRN  Jesse Sans, MD   10 mg at 05/08/21 7494   hydrOXYzine (ATARAX/VISTARIL) tablet 50 mg  50 mg Oral Q6H PRN Mohit Zirbes T, MD   50 mg at 05/18/21 1721   lithium carbonate (ESKALITH) CR tablet 450 mg  450 mg Oral Q12H Malik, Kashif   450 mg at 05/19/21 0815   magnesium hydroxide (MILK OF MAGNESIA) suspension 30 mL  30 mL Oral Daily PRN Adit Riddles, Jackquline Denmark, MD       nicotine (NICODERM CQ - dosed in mg/24 hours) patch 14 mg  14 mg Transdermal Daily Kaitlynd Phillips T, MD   14 mg at 05/19/21 0814   OLANZapine (ZYPREXA) tablet 10 mg  10 mg Oral Q6H PRN Jesse Sans, MD   10 mg at 05/18/21 1720   propranolol (INDERAL) tablet 20 mg  20 mg Oral BID Jesse Sans, MD   20 mg at 05/19/21 0815   [START ON 05/20/2021] risperiDONE (RISPERDAL) tablet 4 mg  4 mg Oral BH-q7a Radek Carnero T, MD       risperiDONE (RISPERDAL) tablet 6 mg  6 mg Oral QHS Shoshanna Mcquitty T, MD   6 mg at 05/18/21 2113   temazepam (RESTORIL) capsule 15 mg  15 mg Oral QHS PRN Prestin Munch, Jackquline Denmark, MD   15 mg at 05/18/21 2113   ziprasidone (GEODON) injection 20 mg  20 mg Intramuscular Q6H PRN Jesse Sans, MD        Lab Results: No results found for this or any previous visit (from the past 48 hour(s)).  Blood Alcohol level:  Lab Results  Component Value Date   [facility] <10 03/26/2019   ETH <11 10/11/2011    Metabolic Disorder Labs: Lab Results  Component Value Date   HGBA1C 5.0 05/05/2021   MPG 96.8 05/05/2021   No results found for: PROLACTIN Lab Results  Component Value Date   CHOL 202 (H) 05/05/2021   TRIG 83 05/05/2021   HDL 35 (L) 05/05/2021   CHOLHDL 5.8 05/05/2021   VLDL 17 05/05/2021   LDLCALC 150 (H) 05/05/2021    Physical Findings: AIMS:  , ,  ,  ,    CIWA:    COWS:     Musculoskeletal: Strength & Muscle Tone: within normal limits Gait & Station: normal Patient leans: N/A  Psychiatric Specialty Exam:  Presentation  General Appearance: Appropriate for Environment; Fairly Groomed; Neat  Eye  Contact:Fair  Speech:Pressured  Speech Volume:Normal  Handedness:Right   Mood and Affect  Mood:Anxious  Affect:Appropriate; Congruent   Thought Process  Thought Processes:Coherent  Descriptions of Associations:Intact  Orientation:Full (Time, Place and Person)  Thought Content:Logical; Abstract Reasoning  History of Schizophrenia/Schizoaffective disorder:No  Duration of Psychotic Symptoms:Less than six months  Hallucinations:No data recorded Ideas of Reference:None  Suicidal Thoughts:No data recorded Homicidal Thoughts:No data recorded  Sensorium  Memory:Immediate Good; Recent Good; Remote Poor; Remote Good  Judgment:Fair  Insight:Fair   Executive Functions  Concentration:Fair  Attention Span:Fair  Recall:Fair  Fund of Knowledge:Fair  Language:Fair   Psychomotor Activity  Psychomotor Activity:No data recorded  Assets  Assets:Communication Skills; Desire for Improvement; Financial Resources/Insurance; Housing; Social Support   Sleep  Sleep:No data recorded   Physical Exam: Physical Exam Vitals and nursing note reviewed.  Constitutional:      Appearance: Normal appearance.  HENT:     Head: Normocephalic and atraumatic.     Mouth/Throat:     Pharynx: Oropharynx is clear.  Eyes:     Pupils: Pupils are equal, round, and reactive to light.  Cardiovascular:     Rate and Rhythm: Normal rate and regular rhythm.  Pulmonary:     Effort: Pulmonary effort is normal.     Breath sounds: Normal breath sounds.  Abdominal:     General: Abdomen is flat.     Palpations: Abdomen is soft.  Musculoskeletal:        General: Normal range of motion.  Skin:    General: Skin is warm and dry.  Neurological:     General: No focal deficit present.     Mental Status: He is alert. Mental status is at baseline.  Psychiatric:        Attention and Perception: Attention normal.        Mood and Affect: Mood is anxious. Affect is inappropriate.        Speech:  Speech is rapid and pressured and tangential.        Behavior: Behavior is agitated.        Thought Content: Thought content is paranoid and delusional.   Review of Systems  Constitutional: Negative.   HENT: Negative.    Eyes: Negative.   Respiratory: Negative.    Cardiovascular: Negative.   Gastrointestinal: Negative.   Musculoskeletal: Negative.   Skin: Negative.   Neurological: Negative.   Psychiatric/Behavioral:  Positive for hallucinations. The patient is nervous/anxious.   Blood pressure (!) 116/92, pulse 87, temperature (!) 97.5 F (36.4 C), temperature source Oral, resp. rate 18, height 6\' 1"  (1.854 m), weight 67.6 kg, SpO2 99 %. Body mass index is 19.66 kg/m.   Treatment Plan Summary: Medication management and Plan this unfortunate patient is still clearly suffering from powerful psychotic symptoms which can become overwhelming at times and cause him great distress.  He did not talk about anything violent or suicidal but he did mention wanting to just "get out" and make it all stop.  Once he calmed down he was very pleasant about talking about medicine.  Recheck lithium level tomorrow go up once again on the Risperdal to 4 in the morning and 6 at night.  , MD 05/19/2021, 4:04 PM

## 2021-05-20 DIAGNOSIS — F312 Bipolar disorder, current episode manic severe with psychotic features: Secondary | ICD-10-CM | POA: Diagnosis not present

## 2021-05-20 LAB — LITHIUM LEVEL: Lithium Lvl: 0.6 mmol/L (ref 0.60–1.20)

## 2021-05-20 MED ORDER — LITHIUM CARBONATE ER 300 MG PO TBCR
600.0000 mg | EXTENDED_RELEASE_TABLET | Freq: Two times a day (BID) | ORAL | Status: DC
  Administered 2021-05-20 – 2021-05-30 (×20): 600 mg via ORAL
  Filled 2021-05-20 (×21): qty 2

## 2021-05-20 NOTE — Progress Notes (Signed)
This writer assumed care of patient at 1500. Patient has been in his room, asleep, in no acute distress. Patient remains safe on the unit.

## 2021-05-20 NOTE — Progress Notes (Signed)
Patient ate dinner, and tolerated his evening medication, without any issues. Patient has been in his room every since. Patient remains safe on the unit.

## 2021-05-20 NOTE — Group Note (Signed)
[facility] LCSW Group Therapy Note   Group Date: 05/20/2021 Start Time: 1300 End Time: 1400  Type of Therapy and Topic:  Group Therapy:  Feelings around Relapse and Recovery  Participation Level:  Did Not Attend   Mood:  Description of Group:    Patients in this group will discuss emotions they experience before and after a relapse. They will process how experiencing these feelings, or avoidance of experiencing them, relates to having a relapse. Facilitator will guide patients to explore emotions they have related to recovery. Patients will be encouraged to process which emotions are more powerful. They will be guided to discuss the emotional reaction significant others in their lives may have to patients' relapse or recovery. Patients will be assisted in exploring ways to respond to the emotions of others without this contributing to a relapse.  Therapeutic Goals: Patient will identify two or more emotions that lead to relapse for them:  Patient will identify two emotions that result when they relapse:  Patient will identify two emotions related to recovery:  Patient will demonstrate ability to communicate their needs through discussion and/or role plays.   Summary of Patient Progress: Patient given the opportunity to attend group, however, declined.    Therapeutic Modalities:   Cognitive Behavioral Therapy Solution-Focused Therapy Assertiveness Training Relapse Prevention Therapy   Adriaan Maltese J Jen Eppinger, LCSW 

## 2021-05-20 NOTE — Plan of Care (Signed)
  Problem: Group Participation Goal: STG - Patient will engage in groups without prompting or encouragement from LRT x3 group sessions within 5 recreation therapy group sessions Description: STG - Patient will engage in groups without prompting or encouragement from LRT x3 group sessions within 5 recreation therapy group sessions Outcome: Not Progressing   

## 2021-05-20 NOTE — Progress Notes (Signed)
[facility] MD Progress Note  05/20/2021 3:23 PM Nicholas Nielsen  MRN:  [id number] Subjective: Follow-up for this 40 year old man with psychosis.  Patient when evaluated today was significantly improved compared to yesterday.  He said he was not having "many" voices today.  All day long he has presented to staff that he is feeling better.  He still stays pretty much completely isolated to his room.  He appears to be compliant with medicine and is not showing any signs of extrapyramidal symptoms.  Lithium level came back today and is 0.68 only slightly different than what it was previously at the lower dosage. Principal Problem: Bipolar affective disorder, currently manic, severe, with psychotic features (HCC) Diagnosis: Principal Problem:   Bipolar affective disorder, currently manic, severe, with psychotic features (HCC) Active Problems:   Cannabis use disorder, moderate, dependence (HCC)  Total Time spent with patient: 30 minutes  Past Psychiatric History: Past psychiatric history of bipolar disorder or schizoaffective disorder  Past Medical History:  Past Medical History:  Diagnosis Date   Back pain    Bipolar 1 disorder (HCC)    Hepatitis C    Kidney stones    Schizo affective schizophrenia (HCC)    Testicular cyst    History reviewed. No pertinent surgical history. Family History:  Family History  Problem Relation Age of Onset   Stroke Father    Hypertension Father    Family Psychiatric  History: See previous Social History:  Social History   Substance and Sexual Activity  Alcohol Use Not Currently     Social History   Substance and Sexual Activity  Drug Use Yes   Frequency: 7.0 times per week   Types: Marijuana   Comment: Daily use; last use is 03/26/2019    Social History   Socioeconomic History   Marital status: Single    Spouse name: Not on file   Number of children: Not on file   Years of education: Not on file   Highest education level: Not on file   Occupational History   Occupation: Unemployed  Tobacco Use   Smoking status: Every Day    Packs/day: 1.00    Years: 15.00    Pack years: 15.00    Types: Cigarettes   Smokeless tobacco: Never  Vaping Use   Vaping Use: Never used  Substance and Sexual Activity   Alcohol use: Not Currently   Drug use: Yes    Frequency: 7.0 times per week    Types: Marijuana    Comment: Daily use; last use is 03/26/2019   Sexual activity: Never    Birth control/protection: Condom  Other Topics Concern   Not on file  Social History Narrative   Pt stated that he is currently living with his brother.  He lives in [place] and is unemployed.  Pt stated that he recently arranged an appointment with [facility].   Social Determinants of Health   Financial Resource Strain: Not on file  Food Insecurity: Not on file  Transportation Needs: Not on file  Physical Activity: Not on file  Stress: Not on file  Social Connections: Not on file   Additional Social History:                         Sleep: Fair  Appetite:  Fair  Current Medications: Current Facility-Administered Medications  Medication Dose Route Frequency Provider Last Rate Last Admin   acetaminophen (TYLENOL) tablet 650 mg  650 mg Oral Q6H PRN Makalah Asberry  T, MD   650 mg at 05/06/21 0741   alum & mag hydroxide-simeth (MAALOX/MYLANTA) 200-200-20 MG/5ML suspension 30 mL  30 mL Oral Q4H PRN Orlandis Sanden T, MD       amLODipine (NORVASC) tablet 5 mg  5 mg Oral Daily Les Pou M, MD   5 mg at 05/20/21 0813   benztropine (COGENTIN) tablet 1 mg  1 mg Oral BID Malik, Kashif   1 mg at 05/20/21 0813   feeding supplement (ENSURE ENLIVE / ENSURE PLUS) liquid 237 mL  237 mL Oral TID BM Jesse Sans, MD   237 mL at 05/20/21 1415   hydrALAZINE (APRESOLINE) tablet 10 mg  10 mg Oral Q6H PRN Jesse Sans, MD   10 mg at 05/08/21 7672   hydrOXYzine (ATARAX/VISTARIL) tablet 50 mg  50 mg Oral Q6H PRN Eshika Reckart, Jackquline Denmark, MD   50 mg at 05/20/21  0818   lithium carbonate (LITHOBID) CR tablet 600 mg  600 mg Oral Q12H Harris Kistler, Jackquline Denmark, MD       magnesium hydroxide (MILK OF MAGNESIA) suspension 30 mL  30 mL Oral Daily PRN Cristela Stalder, Jackquline Denmark, MD       nicotine (NICODERM CQ - dosed in mg/24 hours) patch 14 mg  14 mg Transdermal Daily Kerington Hildebrant, Jackquline Denmark, MD   14 mg at 05/20/21 0812   OLANZapine (ZYPREXA) tablet 10 mg  10 mg Oral Q6H PRN Jesse Sans, MD   10 mg at 05/18/21 1720   propranolol (INDERAL) tablet 20 mg  20 mg Oral BID Jesse Sans, MD   20 mg at 05/20/21 0813   risperiDONE (RISPERDAL) tablet 4 mg  4 mg Oral BH-q7a Taylynn Easton, Jackquline Denmark, MD   4 mg at 05/20/21 0753   risperiDONE (RISPERDAL) tablet 6 mg  6 mg Oral QHS Dyshawn Cangelosi T, MD   6 mg at 05/19/21 2050   temazepam (RESTORIL) capsule 15 mg  15 mg Oral QHS PRN Keidra Withers, Jackquline Denmark, MD   15 mg at 05/19/21 2051   ziprasidone (GEODON) injection 20 mg  20 mg Intramuscular Q6H PRN Jesse Sans, MD        Lab Results:  Results for orders placed or performed during the hospital encounter of 05/03/21 (from the past 48 hour(s))  Lithium level     Status: None   Collection Time: 05/20/21  8:15 AM  Result Value Ref Range   Lithium Lvl 0.60 0.60 - 1.20 mmol/L    Comment: Performed at [facility], [address] [place], [place] [address]    Blood Alcohol level:  Lab Results  Component Value Date   [facility] <10 03/26/2019   ETH <11 10/11/2011    Metabolic Disorder Labs: Lab Results  Component Value Date   HGBA1C 5.0 05/05/2021   MPG 96.8 05/05/2021   No results found for: PROLACTIN Lab Results  Component Value Date   CHOL 202 (H) 05/05/2021   TRIG 83 05/05/2021   HDL 35 (L) 05/05/2021   CHOLHDL 5.8 05/05/2021   VLDL 17 05/05/2021   LDLCALC 150 (H) 05/05/2021    Physical Findings: AIMS:  , ,  ,  ,    CIWA:    COWS:     Musculoskeletal: Strength & Muscle Tone: within normal limits Gait & Station: normal Patient leans: N/A  Psychiatric Specialty  Exam:  Presentation  General Appearance: Appropriate for Environment; Fairly Groomed; Neat  Eye Contact:Fair  Speech:Pressured  Speech Volume:Normal  Handedness:Right   Mood  and Affect  Mood:Anxious  Affect:Appropriate; Congruent   Thought Process  Thought Processes:Coherent  Descriptions of Associations:Intact  Orientation:Full (Time, Place and Person)  Thought Content:Logical; Abstract Reasoning  History of Schizophrenia/Schizoaffective disorder:No  Duration of Psychotic Symptoms:Less than six months  Hallucinations:No data recorded Ideas of Reference:None  Suicidal Thoughts:No data recorded Homicidal Thoughts:No data recorded  Sensorium  Memory:Immediate Good; Recent Good; Remote Poor; Remote Good  Judgment:Fair  Insight:Fair   Executive Functions  Concentration:Fair  Attention Span:Fair  Recall:Fair  Fund of Knowledge:Fair  Language:Fair   Psychomotor Activity  Psychomotor Activity:No data recorded  Assets  Assets:Communication Skills; Desire for Improvement; Financial Resources/Insurance; Housing; Social Support   Sleep  Sleep:No data recorded   Physical Exam: Physical Exam Vitals and nursing note reviewed.  Constitutional:      Appearance: Normal appearance.  HENT:     Head: Normocephalic and atraumatic.     Mouth/Throat:     Pharynx: Oropharynx is clear.  Eyes:     Pupils: Pupils are equal, round, and reactive to light.  Cardiovascular:     Rate and Rhythm: Normal rate and regular rhythm.  Pulmonary:     Effort: Pulmonary effort is normal.     Breath sounds: Normal breath sounds.  Abdominal:     General: Abdomen is flat.     Palpations: Abdomen is soft.  Musculoskeletal:        General: Normal range of motion.  Skin:    General: Skin is warm and dry.  Neurological:     General: No focal deficit present.     Mental Status: He is alert. Mental status is at baseline.  Psychiatric:        Attention and Perception:  Attention normal. He perceives auditory hallucinations.        Mood and Affect: Mood normal. Affect is blunt.        Speech: Speech normal.        Behavior: Behavior is cooperative.        Thought Content: Thought content is paranoid.        Cognition and Memory: Cognition normal.   Review of Systems  Constitutional: Negative.   HENT: Negative.    Eyes: Negative.   Respiratory: Negative.    Cardiovascular: Negative.   Gastrointestinal: Negative.   Musculoskeletal: Negative.   Skin: Negative.   Neurological: Negative.   Psychiatric/Behavioral:  Positive for hallucinations. Negative for suicidal ideas.   Blood pressure 107/86, pulse 78, temperature (!) 97.5 F (36.4 C), temperature source Oral, resp. rate 18, height 6\' 1"  (1.854 m), weight 67.6 kg, SpO2 97 %. Body mass index is 19.66 kg/m.   Treatment Plan Summary: Medication management and Plan as discussed previously this patient's persistent psychosis is being treated with aggressive medication management.  We are now up to 10 mg/day of Risperdal.  I will increase the dose of lithium to 600 mg twice a day because of 2 days blood level.  No change to the Risperdal today.  Continue to encourage patient to come out of his room.  Daily continue monitoring for psychosis.  , MD 05/20/2021, 3:23 PM

## 2021-05-20 NOTE — Progress Notes (Signed)
Recreation Therapy Notes  Date: 05/20/2021  Time: 10:00 am      Location:  Craft room    Behavioral response: N/A   Intervention Topic: Relaxation    Discussion/Intervention: Patient did not attend group.   Clinical Observations/Feedback:  Patient did not attend group.   Kagen Kunath LRT/CTRS        Salayah Meares 05/20/2021 11:19 AM

## 2021-05-20 NOTE — Progress Notes (Signed)
Patient presents with less paranoia than when this writer previously had this patient. Patient denies SI/HI. Pt endorses AVH but denies it at this current time. Pt compliant with medication administration per MD orders. Pt given education, support, and encouragement to be active in his treatment plan. Pt being monitored Q 15 minutes for safety per unit protocol. Pt remains safe on the unit.

## 2021-05-20 NOTE — Plan of Care (Signed)
Patient stated that he could sleep better last night the voice did not bother him. Patient verbalized anxiety about his living situation. Vistaril helped with anxiety.Patient more visible in the milieu and interacting with staff. Denies SI,HI and AVH. Appetite and energy level good. ADLs maintained. Support and encouragement given.

## 2021-05-21 DIAGNOSIS — F312 Bipolar disorder, current episode manic severe with psychotic features: Secondary | ICD-10-CM | POA: Diagnosis not present

## 2021-05-21 NOTE — Progress Notes (Addendum)
[facility] MD Progress Note  05/21/2021 9:41 AM MAYFIELD SCHOENE  MRN:  [id number] Subjective:  Patient reports feeling "a lot better."  Says that his energy has improved appreciably.  Reports an improved mood for the past 3 days.  "I feel my meds are working."  "This is the second day in a row that I have not heard voices." Endorses being able to read minds for the past 3 months--Feel like it's a gift, but states that it can become overwhelming. His sleep was fair.  No new medical issues.  Denies any stressors.  Shares that he is trying to not to dwell on his life stresses while in here, such as some charges that he has. Reports that his mind is calmer and so is able to focus better. No new medical issues.  Principal Problem: Bipolar affective disorder, currently manic, severe, with psychotic features (HCC) Diagnosis: Principal Problem:   Bipolar affective disorder, currently manic, severe, with psychotic features (HCC) Active Problems:   Cannabis use disorder, moderate, dependence (HCC)  Total Time spent with patient: 27 minutes.   Past Psychiatric History: Past psychiatric history of bipolar disorder or schizoaffective disorder  Past Medical History:  Past Medical History:  Diagnosis Date   Back pain    Bipolar 1 disorder (HCC)    Hepatitis C    Kidney stones    Schizo affective schizophrenia [facility])    Testicular cyst    History reviewed. No pertinent surgical history. Family History:  Family History  Problem Relation Age of Onset   Stroke Father    Hypertension Father    Family Psychiatric  History: See previous Social History:  Social History   Substance and Sexual Activity  Alcohol Use Not Currently     Social History   Substance and Sexual Activity  Drug Use Yes   Frequency: 7.0 times per week   Types: Marijuana   Comment: Daily use; last use is 03/26/2019    Social History   Socioeconomic History   Marital status: Single    Spouse name: Not on file   Number of  children: Not on file   Years of education: Not on file   Highest education level: Not on file  Occupational History   Occupation: Unemployed  Tobacco Use   Smoking status: Every Day    Packs/day: 1.00    Years: 15.00    Pack years: 15.00    Types: Cigarettes   Smokeless tobacco: Never  Vaping Use   Vaping Use: Never used  Substance and Sexual Activity   Alcohol use: Not Currently   Drug use: Yes    Frequency: 7.0 times per week    Types: Marijuana    Comment: Daily use; last use is 03/26/2019   Sexual activity: Never    Birth control/protection: Condom  Other Topics Concern   Not on file  Social History Narrative   Pt stated that he is currently living with his brother.  He lives in [place] and is unemployed.  Pt stated that he recently arranged an appointment with [facility].   Social Determinants of Health   Financial Resource Strain: Not on file  Food Insecurity: Not on file  Transportation Needs: Not on file  Physical Activity: Not on file  Stress: Not on file  Social Connections: Not on file   Additional Social History:                         Sleep: Fair  Appetite:  Fair  Current Medications: Current Facility-Administered Medications  Medication Dose Route Frequency Provider Last Rate Last Admin   acetaminophen (TYLENOL) tablet 650 mg  650 mg Oral Q6H PRN Clapacs, John T, MD   650 mg at 05/06/21 0741   alum & mag hydroxide-simeth (MAALOX/MYLANTA) 200-200-20 MG/5ML suspension 30 mL  30 mL Oral Q4H PRN Clapacs, John T, MD       amLODipine (NORVASC) tablet 5 mg  5 mg Oral Daily Les Pou M, MD   5 mg at 05/21/21 0808   benztropine (COGENTIN) tablet 1 mg  1 mg Oral BID Malik, Kashif   1 mg at 05/21/21 0807   feeding supplement (ENSURE ENLIVE / ENSURE PLUS) liquid 237 mL  237 mL Oral TID BM Jesse Sans, MD   237 mL at 05/20/21 1415   hydrALAZINE (APRESOLINE) tablet 10 mg  10 mg Oral Q6H PRN Jesse Sans, MD   10 mg at 05/08/21 8416    hydrOXYzine (ATARAX/VISTARIL) tablet 50 mg  50 mg Oral Q6H PRN Clapacs, Jackquline Denmark, MD   50 mg at 05/21/21 0809   lithium carbonate (LITHOBID) CR tablet 600 mg  600 mg Oral Q12H Clapacs, John T, MD   600 mg at 05/21/21 6063   magnesium hydroxide (MILK OF MAGNESIA) suspension 30 mL  30 mL Oral Daily PRN Clapacs, Jackquline Denmark, MD       nicotine (NICODERM CQ - dosed in mg/24 hours) patch 14 mg  14 mg Transdermal Daily Clapacs, John T, MD   14 mg at 05/21/21 0807   OLANZapine (ZYPREXA) tablet 10 mg  10 mg Oral Q6H PRN Jesse Sans, MD   10 mg at 05/18/21 1720   propranolol (INDERAL) tablet 20 mg  20 mg Oral BID Jesse Sans, MD   20 mg at 05/21/21 0807   risperiDONE (RISPERDAL) tablet 4 mg  4 mg Oral BH-q7a Clapacs, Jackquline Denmark, MD   4 mg at 05/21/21 0160   risperiDONE (RISPERDAL) tablet 6 mg  6 mg Oral QHS Clapacs, John T, MD   6 mg at 05/20/21 2158   temazepam (RESTORIL) capsule 15 mg  15 mg Oral QHS PRN Clapacs, Jackquline Denmark, MD   15 mg at 05/20/21 2158   ziprasidone (GEODON) injection 20 mg  20 mg Intramuscular Q6H PRN Jesse Sans, MD        Lab Results:  Results for orders placed or performed during the hospital encounter of 05/03/21 (from the past 48 hour(s))  Lithium level     Status: None   Collection Time: 05/20/21  8:15 AM  Result Value Ref Range   Lithium Lvl 0.60 0.60 - 1.20 mmol/L    Comment: Performed at [facility], [address] [place], [place] [address]    Blood Alcohol level:  Lab Results  Component Value Date   [facility] <10 03/26/2019   ETH <11 10/11/2011    Metabolic Disorder Labs: Lab Results  Component Value Date   HGBA1C 5.0 05/05/2021   MPG 96.8 05/05/2021   No results found for: PROLACTIN Lab Results  Component Value Date   CHOL 202 (H) 05/05/2021   TRIG 83 05/05/2021   HDL 35 (L) 05/05/2021   CHOLHDL 5.8 05/05/2021   VLDL 17 05/05/2021   LDLCALC 150 (H) 05/05/2021    Physical Findings: AIMS:  , ,  ,  ,    CIWA:    COWS:      Musculoskeletal: Strength & Muscle Tone: within  normal limits Gait & Station: normal Patient leans: N/A  Psychiatric Specialty Exam:  Presentation  General Appearance: hospitla clotes Eye Contact:good Speech:Pressured  Speech Volume:Normal  Handedness:Right   Mood and Affect  Mood: "really good."  Affect:Appropriate; Congruent   Thought Process  Thought Processes:Coherent  Descriptions of Associations:Intact  Orientation:Full (Time, Place and Person)  Thought Content:Logical; Abstract Reasoning  History of Schizophrenia/Schizoaffective disorder:No  Duration of Psychotic Symptoms:Less than six months  Hallucinations:No data recorded Ideas of Reference:None  Suicidal Thoughts:No data recorded Homicidal Thoughts:No data recorded  Sensorium  Memory:Immediate Good; Recent Good; Remote Poor; Remote Good  Judgment:Fair  Insight:Fair   Executive Functions  Concentration:Fair  Attention Span:Fair  Recall:Fair  Fund of Knowledge:Fair  Language:Fair   Psychomotor Activity  Psychomotor Activity:No data recorded  Assets  Assets:Communication Skills; Desire for Improvement; Financial Resources/Insurance; Housing; Social Support   Sleep  Sleep:No data recorded   Physical Exam:  Blood pressure 115/87, pulse 79, temperature 97.8 F (36.6 C), temperature source Oral, resp. rate 18, height 6\' 1"  (1.854 m), weight 67.6 kg, SpO2 100 %. Body mass index is 19.66 kg/m.   Treatment Plan Summary: Medication management and Plan as discussed previously this patient's persistent psychosis is being treated with aggressive medication management.  We are now up to 10 mg/day of Risperdal.  I will increase the dose of lithium to 600 mg twice a day because of 2 days blood level.  No change to the Risperdal today.  Continue to encourage patient to come out of his room.  Daily continue monitoring for psychosis.  11/19 No changes  CPT: 12/19  03212,  MD 05/21/2021, 9:41 AM

## 2021-05-21 NOTE — Group Note (Signed)
LCSW Group Therapy Note  Group Date: 05/21/2021 Start Time: 1315 End Time: 1405   Type of Therapy and Topic:  Group Therapy - Healthy vs Unhealthy Coping Skills  Participation Level:  Did Not Attend   Description of Group The focus of this group was to determine what unhealthy coping techniques typically are used by group members and what healthy coping techniques would be helpful in coping with various problems. Patients were guided in becoming aware of the differences between healthy and unhealthy coping techniques. Patients were asked to identify 2-3 healthy coping skills they would like to learn to use more effectively.  Therapeutic Goals Patients learned that coping is what human beings do all day long to deal with various situations in their lives Patients defined and discussed healthy vs unhealthy coping techniques Patients identified their preferred coping techniques and identified whether these were healthy or unhealthy Patients determined 2-3 healthy coping skills they would like to become more familiar with and use more often. Patients provided support and ideas to each other   Summary of Patient Progress: Patient did not attend group despite encouraged participation.   Therapeutic Modalities Cognitive Behavioral Therapy Motivational Interviewing  Norberto Sorenson, Theresia Majors 05/21/2021  5:29 PM

## 2021-05-21 NOTE — Progress Notes (Signed)
Patient alert and oriented x 4, affect is flat but brightens upon approach, he denies SI/HI/AVH, complaint with medication regimen no distress noted 15 minutes safety checks maintained will continue to monitor.

## 2021-05-21 NOTE — Plan of Care (Signed)
Patient is more visible in the milieu and willing to participate in available activities. Calm and cooperative and denying SI?HI/AVH. Reports that mood is improving and has no major concern. Sleep and appetite improved. Encouragements and support provided. Safety monitored as recommended.

## 2021-05-22 DIAGNOSIS — F312 Bipolar disorder, current episode manic severe with psychotic features: Secondary | ICD-10-CM | POA: Diagnosis not present

## 2021-05-22 MED ORDER — POLYETHYLENE GLYCOL 3350 17 G PO PACK
17.0000 g | PACK | Freq: Every day | ORAL | Status: AC
  Administered 2021-05-22 – 2021-05-24 (×3): 17 g via ORAL
  Filled 2021-05-22 (×3): qty 1

## 2021-05-22 NOTE — Plan of Care (Signed)
  Problem: Education: Goal: Knowledge of [place] [facility] Education information/materials will improve Outcome: Progressing Goal: Emotional status will improve Outcome: Progressing Goal: Mental status will improve Outcome: Progressing Goal: Verbalization of understanding the information provided will improve Outcome: Progressing   Problem: Activity: Goal: Interest or engagement in activities will improve Outcome: Progressing Goal: Sleeping patterns will improve Outcome: Progressing   Problem: Coping: Goal: Ability to verbalize frustrations and anger appropriately will improve Outcome: Progressing Goal: Ability to demonstrate self-control will improve Outcome: Progressing   Problem: Coping: Goal: Ability to verbalize frustrations and anger appropriately will improve Outcome: Progressing Goal: Ability to demonstrate self-control will improve Outcome: Progressing   Problem: Health Behavior/Discharge Planning: Goal: Identification of resources available to assist in meeting health care needs will improve Outcome: Progressing Goal: Compliance with treatment plan for underlying cause of condition will improve Outcome: Progressing   Problem: Physical Regulation: Goal: Ability to maintain clinical measurements within normal limits will improve Outcome: Progressing   Problem: Safety: Goal: Periods of time without injury will increase Outcome: Progressing   Problem: Education: Goal: Ability to state activities that reduce stress will improve Outcome: Progressing   Problem: Coping: Goal: Ability to identify and develop effective coping behavior will improve Outcome: Progressing   Problem: Self-Concept: Goal: Ability to identify factors that promote anxiety will improve Outcome: Progressing Goal: Level of anxiety will decrease Outcome: Progressing Goal: Ability to modify response to factors that promote anxiety will improve Outcome: Progressing   Problem:  Activity: Goal: Will verbalize the importance of balancing activity with adequate rest periods Outcome: Progressing   Problem: Education: Goal: Will be free of psychotic symptoms Outcome: Progressing Goal: Knowledge of the prescribed therapeutic regimen will improve Outcome: Progressing   Problem: Safety: Goal: Ability to redirect hostility and anger into socially appropriate behaviors will improve Outcome: Progressing Goal: Ability to remain free from injury will improve Outcome: Progressing   Problem: Self-Concept: Goal: Will verbalize positive feelings about self Outcome: Progressing

## 2021-05-22 NOTE — Progress Notes (Signed)
Patient noted to be responding to internal stimuli when nurse entered room. Patient yelling out and cursing and  requesting medication for anxiety. PRN medication given for anxiety. Cont Q15 minute check for safety.

## 2021-05-22 NOTE — Progress Notes (Addendum)
[facility] MD Progress Note  05/22/2021 8:59 AM Nicholas Nielsen  MRN:  [id number] Subjective:   11/20 Patient reports having a very positive day yesterday. Maintains that he is feeling pretty well today also. Auditory hallucinations were at a lower intensity yesterday and were on and off. Per nursing report, hallucinations are more in the afternoon and evening time. He did not receive any PRN Zyprexa yesterday but did receive this this morning at 8:40 along with his other scheduled medications. Patient says that he usually sleeps after midnight due to racing thoughts. He did receive PRN  temazepam last night. Believes that he slept until 545 this morning. Denies any significant events over the past 24 hours. Reports paranoia to be mild to moderate. No side effects on his medication. He is glad that he is now eating full meals unlike before.  11/19 Patient reports feeling "a lot better."  Says that his energy has improved appreciably.  Reports an improved mood for the past 3 days.  "I feel my meds are working."  "This is the second day in a row that I have not heard voices." Endorses being able to read minds for the past 3 months--Feel like it's a gift, but states that it can become overwhelming. His sleep was fair.  No new medical issues.  Denies any stressors.  Shares that he is trying to not to dwell on his life stresses while in here, such as some charges that he has. Reports that his mind is calmer and so is able to focus better. No new medical issues.  Principal Problem: Bipolar affective disorder, currently manic, severe, with psychotic features (HCC) Diagnosis: Principal Problem:   Bipolar affective disorder, currently manic, severe, with psychotic features (HCC) Active Problems:   Cannabis use disorder, moderate, dependence (HCC)  Total Time spent with patient: 27 minutes.   Past Psychiatric History: Past psychiatric history of bipolar disorder or schizoaffective disorder  Past Medical  History:  Past Medical History:  Diagnosis Date   Back pain    Bipolar 1 disorder (HCC)    Hepatitis C    Kidney stones    Schizo affective schizophrenia [facility])    Testicular cyst    History reviewed. No pertinent surgical history. Family History:  Family History  Problem Relation Age of Onset   Stroke Father    Hypertension Father    Family Psychiatric  History: See previous Social History:  Social History   Substance and Sexual Activity  Alcohol Use Not Currently     Social History   Substance and Sexual Activity  Drug Use Yes   Frequency: 7.0 times per week   Types: Marijuana   Comment: Daily use; last use is 03/26/2019    Social History   Socioeconomic History   Marital status: Single    Spouse name: Not on file   Number of children: Not on file   Years of education: Not on file   Highest education level: Not on file  Occupational History   Occupation: Unemployed  Tobacco Use   Smoking status: Every Day    Packs/day: 1.00    Years: 15.00    Pack years: 15.00    Types: Cigarettes   Smokeless tobacco: Never  Vaping Use   Vaping Use: Never used  Substance and Sexual Activity   Alcohol use: Not Currently   Drug use: Yes    Frequency: 7.0 times per week    Types: Marijuana    Comment: Daily use; last use is 03/26/2019  Sexual activity: Never    Birth control/protection: Condom  Other Topics Concern   Not on file  Social History Narrative   Pt stated that he is currently living with his brother.  He lives in [place] and is unemployed.  Pt stated that he recently arranged an appointment with [facility].   Social Determinants of Health   Financial Resource Strain: Not on file  Food Insecurity: Not on file  Transportation Needs: Not on file  Physical Activity: Not on file  Stress: Not on file  Social Connections: Not on file   Additional Social History:                         Sleep: Fair  Appetite:  Fair  Current Medications: Current  Facility-Administered Medications  Medication Dose Route Frequency Provider Last Rate Last Admin   acetaminophen (TYLENOL) tablet 650 mg  650 mg Oral Q6H PRN Clapacs, John T, MD   650 mg at 05/06/21 0741   alum & mag hydroxide-simeth (MAALOX/MYLANTA) 200-200-20 MG/5ML suspension 30 mL  30 mL Oral Q4H PRN Clapacs, John T, MD       amLODipine (NORVASC) tablet 5 mg  5 mg Oral Daily Les Pou M, MD   5 mg at 05/22/21 0840   benztropine (COGENTIN) tablet 1 mg  1 mg Oral BID Malik, Kashif   1 mg at 05/22/21 0840   feeding supplement (ENSURE ENLIVE / ENSURE PLUS) liquid 237 mL  237 mL Oral TID BM Jesse Sans, MD   237 mL at 05/21/21 1539   hydrALAZINE (APRESOLINE) tablet 10 mg  10 mg Oral Q6H PRN Jesse Sans, MD   10 mg at 05/08/21 6045   hydrOXYzine (ATARAX/VISTARIL) tablet 50 mg  50 mg Oral Q6H PRN Clapacs, John T, MD   50 mg at 05/21/21 0809   lithium carbonate (LITHOBID) CR tablet 600 mg  600 mg Oral Q12H Clapacs, John T, MD   600 mg at 05/22/21 0840   magnesium hydroxide (MILK OF MAGNESIA) suspension 30 mL  30 mL Oral Daily PRN Clapacs, John T, MD       nicotine (NICODERM CQ - dosed in mg/24 hours) patch 14 mg  14 mg Transdermal Daily Clapacs, John T, MD   14 mg at 05/22/21 0840   OLANZapine (ZYPREXA) tablet 10 mg  10 mg Oral Q6H PRN Jesse Sans, MD   10 mg at 05/22/21 0840   propranolol (INDERAL) tablet 20 mg  20 mg Oral BID Jesse Sans, MD   20 mg at 05/22/21 0840   risperiDONE (RISPERDAL) tablet 4 mg  4 mg Oral BH-q7a Clapacs, Jackquline Denmark, MD   4 mg at 05/22/21 0840   risperiDONE (RISPERDAL) tablet 6 mg  6 mg Oral QHS Clapacs, John T, MD   6 mg at 05/21/21 2137   temazepam (RESTORIL) capsule 15 mg  15 mg Oral QHS PRN Clapacs, Jackquline Denmark, MD   15 mg at 05/21/21 2137   ziprasidone (GEODON) injection 20 mg  20 mg Intramuscular Q6H PRN Jesse Sans, MD        Lab Results:  No results found for this or any previous visit (from the past 48 hour(s)).   Blood Alcohol level:  Lab  Results  Component Value Date   [facility] <10 03/26/2019   ETH <11 10/11/2011    Metabolic Disorder Labs: Lab Results  Component Value Date   HGBA1C 5.0 05/05/2021   MPG 96.8 05/05/2021  No results found for: PROLACTIN Lab Results  Component Value Date   CHOL 202 (H) 05/05/2021   TRIG 83 05/05/2021   HDL 35 (L) 05/05/2021   CHOLHDL 5.8 05/05/2021   VLDL 17 05/05/2021   LDLCALC 150 (H) 05/05/2021    Physical Findings: AIMS:  , ,  ,  ,    CIWA:    COWS:     Musculoskeletal: Strength & Muscle Tone: within normal limits Gait & Station: normal Patient leans: N/A  Psychiatric Specialty Exam:  Presentation  General Appearance:Casual clothing on.  Eye Contact:good Speech:Pushed Speech Volume:Normal  Handedness:Right   Mood and Affect  Mood: "pretty good."  Affect:Appropriate; Congruent   Thought Process  Thought Processes:Coherent  Descriptions of Associations:Intact  Orientation:Full (Time, Place and Person)  Thought Content:Logical; Abstract Reasoning  History of Schizophrenia/Schizoaffective disorder:No  Duration of Psychotic Symptoms:Less than six months  Hallucinations:No data recorded Ideas of Reference:None  Suicidal Thoughts:No data recorded Homicidal Thoughts:No data recorded  Sensorium  Memory:Immediate Good; Recent Good; Remote Poor; Remote Good  Judgment:Fair  Insight:Fair   Executive Functions  Concentration:Fair  Attention Span:Fair  Recall:Fair  Fund of Knowledge:Fair  Language:Fair   Psychomotor Activity  Psychomotor Activity:No data recorded  Assets  Assets:Communication Skills; Desire for Improvement; Financial Resources/Insurance; Housing; Social Support   Sleep  Sleep:No data recorded   Physical Exam:  Blood pressure 102/81, pulse 79, temperature (!) 97.4 F (36.3 C), temperature source Oral, resp. rate 18, height 6\' 1"  (1.854 m), weight 67.6 kg, SpO2 98 %. Body mass index is 19.66 kg/m.   Treatment Plan  Summary: Medication management and Plan as discussed previously this patient's persistent psychosis is being treated with aggressive medication management.  We are now up to 10 mg/day of Risperdal.  I will increase the dose of lithium to 600 mg twice a day because of 2 days blood level.  No change to the Risperdal today.  Continue to encourage patient to come out of his room.  Daily continue monitoring for psychosis.  11/19 No changes  11/20 Patient reports constipation for 3 days. Add Miralax 17g q daily x 3 days.  CPT: 12/20  [id number], MD 05/22/2021, 8:59 AM

## 2021-05-22 NOTE — Progress Notes (Signed)
Patient awake and alert this am. Eating breakfast this am and am present for am medication. Denies SI/HI/AH/VH. Reports anxiety and depression at a 1/10. Reports voices as absent at present. No adverse reaction to medication noted. Cont Q15 minute check for safety.

## 2021-05-22 NOTE — Progress Notes (Signed)
Patient alert and oriented x 4, affect is flat but brightens upon approach, he denies SI/HI/AVH, complaint with medication regimen no distress noted 15 minutes safety checks maintained will continue to monitor.

## 2021-05-22 NOTE — Group Note (Signed)
LCSW Group Therapy Note  Group Date: 05/22/2021 Start Time: 1315 End Time: 1405   Type of Therapy and Topic:  Group Therapy - How To Cope with Nervousness about Discharge   Participation Level:  Did Not Attend   Description of Group This process group involved identification of patients' feelings about discharge. Some of them are scheduled to be discharged soon, while others are new admissions, but each of them was asked to share thoughts and feelings surrounding discharge from the hospital. One common theme was that they are excited at the prospect of going home, while another was that many of them are apprehensive about sharing why they were hospitalized. Patients were given the opportunity to discuss these feelings with their peers in preparation for discharge.  Therapeutic Goals  Patient will identify their overall feelings about pending discharge. Patient will think about how they might proactively address issues that they believe will once again arise once they get home (i.e. with parents). Patients will participate in discussion about having hope for change.   Summary of Patient Progress: Patient did not attend group despite encouraged participation.   Therapeutic Modalities Cognitive Behavioral Therapy   Norberto Sorenson, LCSWA 05/22/2021  2:26 PM

## 2021-05-23 DIAGNOSIS — F312 Bipolar disorder, current episode manic severe with psychotic features: Secondary | ICD-10-CM | POA: Diagnosis not present

## 2021-05-23 MED ORDER — RISPERIDONE 1 MG PO TABS
6.0000 mg | ORAL_TABLET | Freq: Two times a day (BID) | ORAL | Status: DC
  Administered 2021-05-23 – 2021-05-30 (×15): 6 mg via ORAL
  Filled 2021-05-23 (×15): qty 6

## 2021-05-23 NOTE — Plan of Care (Signed)
Patient more visible on the milieu this shift. Denies SI/HI/AH/VH at present and throughout the shift. Rates depression and anxiety as a 1/10. Denies pain. Takes all medication as prescribed and seen interacting with some peers on the milieu. No adverse reaction to medication noted. Cont Q15 minute check for safety.   Problem: Education: Goal: Knowledge of [place] [facility] Education information/materials will improve Outcome: Progressing Goal: Emotional status will improve Outcome: Progressing Goal: Mental status will improve Outcome: Progressing Goal: Verbalization of understanding the information provided will improve Outcome: Progressing   Problem: Education: Goal: Knowledge of [place] [facility] Education information/materials will improve 05/23/2021 1917 by Angeline Slim, RN Outcome: Progressing 05/23/2021 1916 by Angeline Slim, RN Outcome: Progressing Goal: Emotional status will improve 05/23/2021 1917 by Angeline Slim, RN Outcome: Progressing 05/23/2021 1916 by Angeline Slim, RN Outcome: Progressing Goal: Mental status will improve 05/23/2021 1917 by Angeline Slim, RN Outcome: Progressing 05/23/2021 1916 by Angeline Slim, RN Outcome: Progressing Goal: Verbalization of understanding the information provided will improve 05/23/2021 1917 by Angeline Slim, RN Outcome: Progressing 05/23/2021 1916 by Angeline Slim, RN Outcome: Progressing   Problem: Activity: Goal: Interest or engagement in activities will improve Outcome: Progressing Goal: Sleeping patterns will improve Outcome: Progressing

## 2021-05-23 NOTE — Group Note (Signed)
[facility] LCSW Group Therapy Note    Group Date: 05/23/2021 Start Time: 1310 End Time: 1400  Type of Therapy and Topic:  Group Therapy:  Overcoming Obstacles  Participation Level:  [facility] PARTICIPATION LEVEL: Did Not Attend  Mood:  Description of Group:   In this group patients will be encouraged to explore what they see as obstacles to their own wellness and recovery. They will be guided to discuss their thoughts, feelings, and behaviors related to these obstacles. The group will process together ways to cope with barriers, with attention given to specific choices patients can make. Each patient will be challenged to identify changes they are motivated to make in order to overcome their obstacles. This group will be process-oriented, with patients participating in exploration of their own experiences as well as giving and receiving support and challenge from other group members.  Therapeutic Goals: 1. Patient will identify personal and current obstacles as they relate to admission. 2. Patient will identify barriers that currently interfere with their wellness or overcoming obstacles.  3. Patient will identify feelings, thought process and behaviors related to these barriers. 4. Patient will identify two changes they are willing to make to overcome these obstacles:    Summary of Patient Progress X  Therapeutic Modalities:   Cognitive Behavioral Therapy Solution Focused Therapy Motivational Interviewing Relapse Prevention Therapy   Glenis Smoker, LCSW

## 2021-05-23 NOTE — Progress Notes (Signed)
[facility] MD Progress Note  05/23/2021 1:01 PM Nicholas Nielsen  MRN:  [id number] Subjective: Follow-up 40 year old man with bipolar with psychotic symptoms.  Patient has been getting out of his room a little more.  Still mostly in his room but has been out and tried to attend some groups.  Continues to eat well.  Taking his medicine regularly.  Patient tells me that he is not having hallucinations the last 2 days.  He presents himself as being much improved symptomatically.  Nursing reports he has still had some outbursts and confusion over the weekend.  No violence specifically however. Principal Problem: Bipolar affective disorder, currently manic, severe, with psychotic features (HCC) Diagnosis: Principal Problem:   Bipolar affective disorder, currently manic, severe, with psychotic features (HCC) Active Problems:   Cannabis use disorder, moderate, dependence (HCC)  Total Time spent with patient: 30 minutes  Past Psychiatric History: Past history of bipolar with psychotic features  Past Medical History:  Past Medical History:  Diagnosis Date   Back pain    Bipolar 1 disorder (HCC)    Hepatitis C    Kidney stones    Schizo affective schizophrenia (HCC)    Testicular cyst    History reviewed. No pertinent surgical history. Family History:  Family History  Problem Relation Age of Onset   Stroke Father    Hypertension Father    Family Psychiatric  History: See previous Social History:  Social History   Substance and Sexual Activity  Alcohol Use Not Currently     Social History   Substance and Sexual Activity  Drug Use Yes   Frequency: 7.0 times per week   Types: Marijuana   Comment: Daily use; last use is 03/26/2019    Social History   Socioeconomic History   Marital status: Single    Spouse name: Not on file   Number of children: Not on file   Years of education: Not on file   Highest education level: Not on file  Occupational History   Occupation: Unemployed   Tobacco Use   Smoking status: Every Day    Packs/day: 1.00    Years: 15.00    Pack years: 15.00    Types: Cigarettes   Smokeless tobacco: Never  Vaping Use   Vaping Use: Never used  Substance and Sexual Activity   Alcohol use: Not Currently   Drug use: Yes    Frequency: 7.0 times per week    Types: Marijuana    Comment: Daily use; last use is 03/26/2019   Sexual activity: Never    Birth control/protection: Condom  Other Topics Concern   Not on file  Social History Narrative   Pt stated that he is currently living with his brother.  He lives in [place] and is unemployed.  Pt stated that he recently arranged an appointment with [facility].   Social Determinants of Health   Financial Resource Strain: Not on file  Food Insecurity: Not on file  Transportation Needs: Not on file  Physical Activity: Not on file  Stress: Not on file  Social Connections: Not on file   Additional Social History:                         Sleep: Fair  Appetite:  Fair  Current Medications: Current Facility-Administered Medications  Medication Dose Route Frequency Provider Last Rate Last Admin   acetaminophen (TYLENOL) tablet 650 mg  650 mg Oral Q6H PRN Krishang Reading, Jackquline Denmark, MD   650  mg at 05/06/21 0741   alum & mag hydroxide-simeth (MAALOX/MYLANTA) 200-200-20 MG/5ML suspension 30 mL  30 mL Oral Q4H PRN Jenya Putz T, MD       amLODipine (NORVASC) tablet 5 mg  5 mg Oral Daily Les Pou M, MD   5 mg at 05/23/21 0811   benztropine (COGENTIN) tablet 1 mg  1 mg Oral BID Malik, Kashif   1 mg at 05/23/21 4967   feeding supplement (ENSURE ENLIVE / ENSURE PLUS) liquid 237 mL  237 mL Oral TID BM Jesse Sans, MD   237 mL at 05/23/21 1052   hydrALAZINE (APRESOLINE) tablet 10 mg  10 mg Oral Q6H PRN Jesse Sans, MD   10 mg at 05/08/21 5916   hydrOXYzine (ATARAX/VISTARIL) tablet 50 mg  50 mg Oral Q6H PRN Jennalyn Cawley, Jackquline Denmark, MD   50 mg at 05/23/21 0811   lithium carbonate (LITHOBID) CR tablet  600 mg  600 mg Oral Q12H Shawntay Prest T, MD   600 mg at 05/23/21 3846   magnesium hydroxide (MILK OF MAGNESIA) suspension 30 mL  30 mL Oral Daily PRN Levita Monical, Jackquline Denmark, MD       nicotine (NICODERM CQ - dosed in mg/24 hours) patch 14 mg  14 mg Transdermal Daily Sherra Kimmons T, MD   14 mg at 05/23/21 0811   OLANZapine (ZYPREXA) tablet 10 mg  10 mg Oral Q6H PRN Jesse Sans, MD   10 mg at 05/23/21 0811   polyethylene glycol (MIRALAX / GLYCOLAX) packet 17 g  17 g Oral Daily Reggie Pile, MD   17 g at 05/23/21 0814   propranolol (INDERAL) tablet 20 mg  20 mg Oral BID Jesse Sans, MD   20 mg at 05/23/21 6599   risperiDONE (RISPERDAL) tablet 6 mg  6 mg Oral BID Kenishia Plack, Jackquline Denmark, MD       temazepam (RESTORIL) capsule 15 mg  15 mg Oral QHS PRN Cailie Bosshart, Jackquline Denmark, MD   15 mg at 05/23/21 0022   ziprasidone (GEODON) injection 20 mg  20 mg Intramuscular Q6H PRN Jesse Sans, MD        Lab Results: No results found for this or any previous visit (from the past 48 hour(s)).  Blood Alcohol level:  Lab Results  Component Value Date   [facility] <10 03/26/2019   ETH <11 10/11/2011    Metabolic Disorder Labs: Lab Results  Component Value Date   HGBA1C 5.0 05/05/2021   MPG 96.8 05/05/2021   No results found for: PROLACTIN Lab Results  Component Value Date   CHOL 202 (H) 05/05/2021   TRIG 83 05/05/2021   HDL 35 (L) 05/05/2021   CHOLHDL 5.8 05/05/2021   VLDL 17 05/05/2021   LDLCALC 150 (H) 05/05/2021    Physical Findings: AIMS:  , ,  ,  ,    CIWA:    COWS:     Musculoskeletal: Strength & Muscle Tone: within normal limits Gait & Station: normal Patient leans: N/A  Psychiatric Specialty Exam:  Presentation  General Appearance: Appropriate for Environment; Fairly Groomed; Neat  Eye Contact:Fair  Speech:Pressured  Speech Volume:Normal  Handedness:Right   Mood and Affect  Mood:Anxious  Affect:Appropriate; Congruent   Thought Process  Thought  Processes:Coherent  Descriptions of Associations:Intact  Orientation:Full (Time, Place and Person)  Thought Content:Logical; Abstract Reasoning  History of Schizophrenia/Schizoaffective disorder:No  Duration of Psychotic Symptoms:Less than six months  Hallucinations:No data recorded Ideas of Reference:None  Suicidal Thoughts:No data recorded Homicidal Thoughts:No  data recorded  Sensorium  Memory:Immediate Good; Recent Good; Remote Poor; Remote Good  Judgment:Fair  Insight:Fair   Executive Functions  Concentration:Fair  Attention Span:Fair  Recall:Fair  Fund of Knowledge:Fair  Language:Fair   Psychomotor Activity  Psychomotor Activity:No data recorded  Assets  Assets:Communication Skills; Desire for Improvement; Financial Resources/Insurance; Housing; Social Support   Sleep  Sleep:No data recorded   Physical Exam: Physical Exam Vitals and nursing note reviewed.  Constitutional:      Appearance: Normal appearance.  HENT:     Head: Normocephalic and atraumatic.     Mouth/Throat:     Pharynx: Oropharynx is clear.  Eyes:     Pupils: Pupils are equal, round, and reactive to light.  Cardiovascular:     Rate and Rhythm: Normal rate and regular rhythm.  Pulmonary:     Effort: Pulmonary effort is normal.     Breath sounds: Normal breath sounds.  Abdominal:     General: Abdomen is flat.     Palpations: Abdomen is soft.  Musculoskeletal:        General: Normal range of motion.  Skin:    General: Skin is warm and dry.  Neurological:     General: No focal deficit present.     Mental Status: He is alert. Mental status is at baseline.  Psychiatric:        Attention and Perception: Attention normal.        Mood and Affect: Mood normal. Affect is blunt.        Speech: Speech is delayed.        Behavior: Behavior is slowed.        Thought Content: Thought content normal.        Cognition and Memory: Cognition is impaired.   Review of Systems   Constitutional: Negative.   HENT: Negative.    Eyes: Negative.   Respiratory: Negative.    Cardiovascular: Negative.   Gastrointestinal: Negative.   Musculoskeletal: Negative.   Skin: Negative.   Neurological: Negative.   Psychiatric/Behavioral:  Negative for hallucinations, substance abuse and suicidal ideas. The patient is nervous/anxious.   Blood pressure 109/88, pulse 77, temperature (!) 97.4 F (36.3 C), temperature source Oral, resp. rate 17, height 6\' 1"  (1.854 m), weight 67.6 kg, SpO2 97 %. Body mass index is 19.66 kg/m.   Treatment Plan Summary: Medication management and Plan increase Risperdal to 6 mg twice a day given his ongoing tolerance of medication but persistence of psychosis.  Last lithium level 0.6 dose was increased a little bit but no signs of lithium toxicity.  Encourage patient and point out that he really does seem to be getting a little better.  Continue daily monitoring with the whole treatment team of his psychotic symptoms to determine safe discharge plan  , MD 05/23/2021, 1:01 PM

## 2021-05-23 NOTE — Progress Notes (Addendum)
Patient is isolative to his room. He does come out for snack, but quickly goes back to his room. His contact with both staff and other patient is minimal.  Patient does report feeling more anxious when around others. He denies having any paranoia. Patient also denies si/hi/avh. He requested that his medication be given when he is was finally ready for bed. Although patient was in his room, in his bed at both 10p and 11p he refused his medications, stating that he was not quite ready for bed.  Finally at 12am patient reported being sleepy and being ready to go to sleep.  QHS medication was provided to him at that time and patient received without incident.  He was encouraged to seek staff with any concerns and q15 minute safety checks will  be conducted for patient safety and monitoring.   Cleo Morriss-Nicholson, LPN

## 2021-05-24 DIAGNOSIS — F312 Bipolar disorder, current episode manic severe with psychotic features: Secondary | ICD-10-CM | POA: Diagnosis not present

## 2021-05-24 NOTE — Progress Notes (Signed)
Patient is less isolative today. Seen pacing the halls with some minimal interaction with other patients on the unit.  He is med compliant and received his meds without incident.  He continues to endorse avh but reports his symptoms are getting better. He denies si/hi anxiety and depression at this encounter. Encouraged him to seek nursing staff with any concerns.  Will continue to monitor with q15 minute safety checks.   Cleo Grima-Nicholson, LPN

## 2021-05-24 NOTE — BH IP Treatment Plan (Signed)
Interdisciplinary Treatment and Diagnostic Plan Update  05/24/2021 Time of Session: 8:30AM Nicholas Nielsen MRN: [id number]  Principal Diagnosis: Bipolar affective disorder, currently manic, severe, with psychotic features (HCC)  Secondary Diagnoses: Principal Problem:   Bipolar affective disorder, currently manic, severe, with psychotic features (HCC) Active Problems:   Cannabis use disorder, moderate, dependence (HCC)   Current Medications:  Current Facility-Administered Medications  Medication Dose Route Frequency Provider Last Rate Last Admin   acetaminophen (TYLENOL) tablet 650 mg  650 mg Oral Q6H PRN Clapacs, John T, MD   650 mg at 05/06/21 0741   alum & mag hydroxide-simeth (MAALOX/MYLANTA) 200-200-20 MG/5ML suspension 30 mL  30 mL Oral Q4H PRN Clapacs, John T, MD       amLODipine (NORVASC) tablet 5 mg  5 mg Oral Daily Les Pou M, MD   5 mg at 05/24/21 0834   benztropine (COGENTIN) tablet 1 mg  1 mg Oral BID Malik, Kashif   1 mg at 05/24/21 0836   feeding supplement (ENSURE ENLIVE / ENSURE PLUS) liquid 237 mL  237 mL Oral TID BM Jesse Sans, MD   237 mL at 05/24/21 1045   hydrALAZINE (APRESOLINE) tablet 10 mg  10 mg Oral Q6H PRN Jesse Sans, MD   10 mg at 05/08/21 0076   hydrOXYzine (ATARAX/VISTARIL) tablet 50 mg  50 mg Oral Q6H PRN Clapacs, John T, MD   50 mg at 05/23/21 1755   lithium carbonate (LITHOBID) CR tablet 600 mg  600 mg Oral Q12H Clapacs, John T, MD   600 mg at 05/24/21 2263   magnesium hydroxide (MILK OF MAGNESIA) suspension 30 mL  30 mL Oral Daily PRN Clapacs, Jackquline Denmark, MD       nicotine (NICODERM CQ - dosed in mg/24 hours) patch 14 mg  14 mg Transdermal Daily Clapacs, John T, MD   14 mg at 05/24/21 0841   OLANZapine (ZYPREXA) tablet 10 mg  10 mg Oral Q6H PRN Jesse Sans, MD   10 mg at 05/23/21 1755   propranolol (INDERAL) tablet 20 mg  20 mg Oral BID Jesse Sans, MD   20 mg at 05/24/21 0835   risperiDONE (RISPERDAL) tablet 6 mg  6 mg Oral BID  Clapacs, John T, MD   6 mg at 05/24/21 0834   temazepam (RESTORIL) capsule 15 mg  15 mg Oral QHS PRN Clapacs, John T, MD   15 mg at 05/23/21 2137   ziprasidone (GEODON) injection 20 mg  20 mg Intramuscular Q6H PRN Jesse Sans, MD       PTA Medications: Medications Prior to Admission  Medication Sig Dispense Refill Last Dose   citalopram (CELEXA) 20 MG tablet Take 20 mg by mouth every morning.      HYDROcodone-acetaminophen (NORCO/VICODIN) 5-325 MG tablet Take 1 tablet by mouth every 6 (six) hours as needed for severe pain. (Patient not taking: No sig reported) 10 tablet 0    hydrOXYzine (ATARAX/VISTARIL) 25 MG tablet Take 1 tablet (25 mg total) by mouth every 8 (eight) hours as needed for anxiety. (Patient not taking: Reported on 03/17/2019) 15 tablet 0    hydrOXYzine (ATARAX/VISTARIL) 25 MG tablet Take 1 tablet (25 mg total) by mouth every 6 (six) hours as needed for anxiety. (Patient not taking: No sig reported) 30 tablet 2    ondansetron (ZOFRAN ODT) 4 MG disintegrating tablet Take 1 tablet (4 mg total) by mouth every 8 (eight) hours as needed for nausea or vomiting. (Patient not taking: Reported  on 03/26/2019) 20 tablet 0    ondansetron (ZOFRAN) 4 MG tablet Take 1 tablet (4 mg total) by mouth every 8 (eight) hours as needed for nausea or vomiting. (Patient not taking: Reported on 05/03/2021) 15 tablet 0    sertraline (ZOLOFT) 50 MG tablet Take 1 tablet (50 mg total) by mouth daily. (Patient not taking: Reported on 05/03/2021) 30 tablet 1    traZODone (DESYREL) 50 MG tablet Take 50 mg by mouth at bedtime.       Patient Stressors:    Patient Strengths:    Treatment Modalities: Medication Management, Group therapy, Case management,  1 to 1 session with clinician, Psychoeducation, Recreational therapy.   Physician Treatment Plan for Primary Diagnosis: Bipolar affective disorder, currently manic, severe, with psychotic features (HCC) Long Term Goal(s): Improvement in symptoms so as ready  for discharge   Short Term Goals: Ability to identify changes in lifestyle to reduce recurrence of condition will improve Ability to verbalize feelings will improve Ability to disclose and discuss suicidal ideas Ability to demonstrate self-control will improve Ability to identify and develop effective coping behaviors will improve Ability to maintain clinical measurements within normal limits will improve Compliance with prescribed medications will improve Ability to identify triggers associated with substance abuse/mental health issues will improve  Medication Management: Evaluate patient's response, side effects, and tolerance of medication regimen.  Therapeutic Interventions: 1 to 1 sessions, Unit Group sessions and Medication administration.  Evaluation of Outcomes: Progressing  Physician Treatment Plan for Secondary Diagnosis: Principal Problem:   Bipolar affective disorder, currently manic, severe, with psychotic features (HCC) Active Problems:   Cannabis use disorder, moderate, dependence (HCC)  Long Term Goal(s): Improvement in symptoms so as ready for discharge   Short Term Goals: Ability to identify changes in lifestyle to reduce recurrence of condition will improve Ability to verbalize feelings will improve Ability to disclose and discuss suicidal ideas Ability to demonstrate self-control will improve Ability to identify and develop effective coping behaviors will improve Ability to maintain clinical measurements within normal limits will improve Compliance with prescribed medications will improve Ability to identify triggers associated with substance abuse/mental health issues will improve     Medication Management: Evaluate patient's response, side effects, and tolerance of medication regimen.  Therapeutic Interventions: 1 to 1 sessions, Unit Group sessions and Medication administration.  Evaluation of Outcomes: Progressing   RN Treatment Plan for Primary  Diagnosis: Bipolar affective disorder, currently manic, severe, with psychotic features (HCC) Long Term Goal(s): Knowledge of disease and therapeutic regimen to maintain health will improve  Short Term Goals: Ability to demonstrate self-control, Ability to participate in decision making will improve, Ability to verbalize feelings will improve, Ability to disclose and discuss suicidal ideas, Ability to identify and develop effective coping behaviors will improve, and Compliance with prescribed medications will improve  Medication Management: RN will administer medications as ordered by provider, will assess and evaluate patient's response and provide education to patient for prescribed medication. RN will report any adverse and/or side effects to prescribing provider.  Therapeutic Interventions: 1 on 1 counseling sessions, Psychoeducation, Medication administration, Evaluate responses to treatment, Monitor vital signs and CBGs as ordered, Perform/monitor CIWA, COWS, AIMS and Fall Risk screenings as ordered, Perform wound care treatments as ordered.  Evaluation of Outcomes: Progressing   LCSW Treatment Plan for Primary Diagnosis: Bipolar affective disorder, currently manic, severe, with psychotic features (HCC) Long Term Goal(s): Safe transition to appropriate next level of care at discharge, Engage patient in therapeutic group addressing interpersonal concerns.  Short Term Goals: Engage patient in aftercare planning with referrals and resources, Increase social support, Increase ability to appropriately verbalize feelings, Increase emotional regulation, Facilitate acceptance of mental health diagnosis and concerns, and Increase skills for wellness and recovery  Therapeutic Interventions: Assess for all discharge needs, 1 to 1 time with Social worker, Explore available resources and support systems, Assess for adequacy in community support network, Educate family and significant other(s) on suicide  prevention, Complete Psychosocial Assessment, Interpersonal group therapy.  Evaluation of Outcomes: Progressing   Progress in Treatment: Attending groups: No. Participating in groups: No. Taking medication as prescribed: Yes. Toleration medication: Yes. Family/Significant other contact made: Yes, individual(s) contacted:  SPE completed with the patient. Patient declined collateral contact at this time.  Patient understands diagnosis: Yes. Discussing patient identified problems/goals with staff: Yes. Medical problems stabilized or resolved: Yes. Denies suicidal/homicidal ideation: Yes. Issues/concerns per patient self-inventory: No. Other: none  New problem(s) identified: No, Describe:  none   New Short Term/Long Term Goal(s): detox, elimination of symptoms of psychosis, medication management for mood stabilization; elimination of SI thoughts; development of comprehensive mental wellness/sobriety plan. 05/09/21 Update: No changes at this time. 05/14/21 Update:  No changes at this time. 05/19/2021 Update:  No changes at this time. 05/24/2021 Update:  No changes at this time.    Patient Goals:  "Just my medicine." 05/09/21 Update: No changes at this time. 05/14/21 Update:  No changes at this time. 05/19/2021 Update:  No changes at this time. 05/24/2021 Update:  No changes at this time.    Discharge Plan or Barriers: CSW will assist with development of an appropriate aftercare/discharge plan. 05/09/21 Update: No changes at this time.05/14/21 Update:  No changes at this time. 05/19/2021 Update:  Patient's medications have been increased to support continued improvement. 05/24/2021 Update:  Patient continues to do well on the unit.  He continues to struggle with attending group, however, evidence has been made that ptient attempts to go, he walks down the hallway but is unable to enter the room.  He continues to report auditory hallucinations, however states that they are not command hallucinations  and he is able to differentiate among them.     Reason for Continuation of Hospitalization: Hallucinations Medication stabilization Suicidal ideation   Estimated Length of Stay: TBD     Scribe for Treatment Team: Doni, Widmer 05/24/2021 12:34 PM

## 2021-05-24 NOTE — Group Note (Signed)
[facility] LCSW Group Therapy Note   Group Date: 05/24/2021 Start Time: 1300 End Time: 1400  Type of Therapy/Topic:  Group Therapy:  Feelings about Diagnosis  Participation Level:  Did Not Attend   Description of Group:    This group will allow patients to explore their thoughts and feelings about diagnoses they have received. Patients will be guided to explore their level of understanding and acceptance of these diagnoses. Facilitator will encourage patients to process their thoughts and feelings about the reactions of others to their diagnosis, and will guide patients in identifying ways to discuss their diagnosis with significant others in their lives. This group will be process-oriented, with patients participating in exploration of their own experiences as well as giving and receiving support and challenge from other group members.   Therapeutic Goals: 1. Patient will demonstrate understanding of diagnosis as evidence by identifying two or more symptoms of the disorder:  2. Patient will be able to express two feelings regarding the diagnosis 3. Patient will demonstrate ability to communicate their needs through discussion and/or role plays  Summary of Patient Progress:  Patient given the opportunity to attend group, however, declined.   Therapeutic Modalities:   Cognitive Behavioral Therapy Brief Therapy Feelings Identification    Harden Mo, LCSW

## 2021-05-24 NOTE — Plan of Care (Signed)
  Problem: Education: Goal: Knowledge of [facility] Education information/materials will improve Outcome: Progressing Goal: Emotional status will improve Outcome: Progressing Goal: Mental status will improve Outcome: Progressing Goal: Verbalization of understanding the information provided will improve Outcome: Progressing   Problem: Activity: Goal: Interest or engagement in activities will improve Outcome: Progressing Goal: Sleeping patterns will improve Outcome: Progressing   Problem: Coping: Goal: Ability to verbalize frustrations and anger appropriately will improve Outcome: Progressing Goal: Ability to demonstrate self-control will improve Outcome: Progressing   

## 2021-05-24 NOTE — Plan of Care (Signed)
See progress note Problem: Education: Goal: Knowledge of [facility] Education information/materials will improve Outcome: Progressing Goal: Emotional status will improve Outcome: Progressing Goal: Mental status will improve Outcome: Progressing Goal: Verbalization of understanding the information provided will improve Outcome: Progressing   Problem: Education: Goal: Emotional status will improve Outcome: Progressing   Problem: Education: Goal: Mental status will improve Outcome: Progressing   Problem: Education: Goal: Verbalization of understanding the information provided will improve Outcome: Progressing   Problem: Activity: Goal: Interest or engagement in activities will improve Outcome: Progressing   Problem: Activity: Goal: Sleeping patterns will improve Outcome: Progressing   Problem: Coping: Goal: Ability to verbalize frustrations and anger appropriately will improve Outcome: Progressing   Problem: Coping: Goal: Ability to demonstrate self-control will improve Outcome: Progressing   Problem: Health Behavior/Discharge Planning: Goal: Identification of resources available to assist in meeting health care needs will improve Outcome: Progressing   Problem: Health Behavior/Discharge Planning: Goal: Compliance with treatment plan for underlying cause of condition will improve Outcome: Progressing   Problem: Safety: Goal: Periods of time without injury will increase Outcome: Progressing   Problem: Education: Goal: Ability to state activities that reduce stress will improve Outcome: Progressing   Problem: Coping: Goal: Ability to identify and develop effective coping behavior will improve Outcome: Progressing   Problem: Self-Concept: Goal: Ability to identify factors that promote anxiety will improve Outcome: Progressing Goal: Level of anxiety will decrease Outcome: Progressing Goal: Ability to modify response to factors that promote anxiety  will improve Outcome: Progressing

## 2021-05-24 NOTE — Progress Notes (Signed)
[facility] MD Progress Note  05/24/2021 4:20 PM Nicholas Nielsen  MRN:  [id number] Subjective: Follow-up 40 year old man with psychotic disorder.  Patient says he is doing better today which is pretty much the same thing he has said every day I have asked him.  Still does not come out of his room very much.  Does not look as frightened though.  Says he still sees things at times but not very often.  Says he feels less frightened about going home.  No side effects from medicine. Principal Problem: Bipolar affective disorder, currently manic, severe, with psychotic features (HCC) Diagnosis: Principal Problem:   Bipolar affective disorder, currently manic, severe, with psychotic features (HCC) Active Problems:   Cannabis use disorder, moderate, dependence (HCC)  Total Time spent with patient: 30 minutes  Past Psychiatric History: Past history of bipolar psychosis  Past Medical History:  Past Medical History:  Diagnosis Date   Back pain    Bipolar 1 disorder (HCC)    Hepatitis C    Kidney stones    Schizo affective schizophrenia (HCC)    Testicular cyst    History reviewed. No pertinent surgical history. Family History:  Family History  Problem Relation Age of Onset   Stroke Father    Hypertension Father    Family Psychiatric  History: See previous Social History:  Social History   Substance and Sexual Activity  Alcohol Use Not Currently     Social History   Substance and Sexual Activity  Drug Use Yes   Frequency: 7.0 times per week   Types: Marijuana   Comment: Daily use; last use is 03/26/2019    Social History   Socioeconomic History   Marital status: Single    Spouse name: Not on file   Number of children: Not on file   Years of education: Not on file   Highest education level: Not on file  Occupational History   Occupation: Unemployed  Tobacco Use   Smoking status: Every Day    Packs/day: 1.00    Years: 15.00    Pack years: 15.00    Types: Cigarettes   Smokeless  tobacco: Never  Vaping Use   Vaping Use: Never used  Substance and Sexual Activity   Alcohol use: Not Currently   Drug use: Yes    Frequency: 7.0 times per week    Types: Marijuana    Comment: Daily use; last use is 03/26/2019   Sexual activity: Never    Birth control/protection: Condom  Other Topics Concern   Not on file  Social History Narrative   Pt stated that he is currently living with his brother.  He lives in [place] and is unemployed.  Pt stated that he recently arranged an appointment with [facility].   Social Determinants of Health   Financial Resource Strain: Not on file  Food Insecurity: Not on file  Transportation Needs: Not on file  Physical Activity: Not on file  Stress: Not on file  Social Connections: Not on file   Additional Social History:                         Sleep: Fair  Appetite:  Fair  Current Medications: Current Facility-Administered Medications  Medication Dose Route Frequency Provider Last Rate Last Admin   acetaminophen (TYLENOL) tablet 650 mg  650 mg Oral Q6H PRN Bhavika Schnider, Jackquline Denmark, MD   650 mg at 05/06/21 0741   alum & mag hydroxide-simeth (MAALOX/MYLANTA) 200-200-20 MG/5ML suspension 30  mL  30 mL Oral Q4H PRN Vilda Zollner T, MD       amLODipine (NORVASC) tablet 5 mg  5 mg Oral Daily Les Pou M, MD   5 mg at 05/24/21 0834   benztropine (COGENTIN) tablet 1 mg  1 mg Oral BID Malik, Kashif   1 mg at 05/24/21 0836   feeding supplement (ENSURE ENLIVE / ENSURE PLUS) liquid 237 mL  237 mL Oral TID BM Jesse Sans, MD   237 mL at 05/24/21 1507   hydrALAZINE (APRESOLINE) tablet 10 mg  10 mg Oral Q6H PRN Jesse Sans, MD   10 mg at 05/08/21 7829   hydrOXYzine (ATARAX/VISTARIL) tablet 50 mg  50 mg Oral Q6H PRN Rilley Stash T, MD   50 mg at 05/23/21 1755   lithium carbonate (LITHOBID) CR tablet 600 mg  600 mg Oral Q12H Xayvier Vallez T, MD   600 mg at 05/24/21 5621   magnesium hydroxide (MILK OF MAGNESIA) suspension 30 mL  30 mL  Oral Daily PRN Minard Millirons, Jackquline Denmark, MD       nicotine (NICODERM CQ - dosed in mg/24 hours) patch 14 mg  14 mg Transdermal Daily Marke Goodwyn T, MD   14 mg at 05/24/21 0841   OLANZapine (ZYPREXA) tablet 10 mg  10 mg Oral Q6H PRN Jesse Sans, MD   10 mg at 05/23/21 1755   propranolol (INDERAL) tablet 20 mg  20 mg Oral BID Jesse Sans, MD   20 mg at 05/24/21 0835   risperiDONE (RISPERDAL) tablet 6 mg  6 mg Oral BID Analyce Tavares, Jackquline Denmark, MD   6 mg at 05/24/21 0834   temazepam (RESTORIL) capsule 15 mg  15 mg Oral QHS PRN Travious Vanover, Jackquline Denmark, MD   15 mg at 05/23/21 2137   ziprasidone (GEODON) injection 20 mg  20 mg Intramuscular Q6H PRN Jesse Sans, MD        Lab Results: No results found for this or any previous visit (from the past 48 hour(s)).  Blood Alcohol level:  Lab Results  Component Value Date   [facility] <10 03/26/2019   ETH <11 10/11/2011    Metabolic Disorder Labs: Lab Results  Component Value Date   HGBA1C 5.0 05/05/2021   MPG 96.8 05/05/2021   No results found for: PROLACTIN Lab Results  Component Value Date   CHOL 202 (H) 05/05/2021   TRIG 83 05/05/2021   HDL 35 (L) 05/05/2021   CHOLHDL 5.8 05/05/2021   VLDL 17 05/05/2021   LDLCALC 150 (H) 05/05/2021    Physical Findings: AIMS:  , ,  ,  ,    CIWA:    COWS:     Musculoskeletal: Strength & Muscle Tone: within normal limits Gait & Station: normal Patient leans: N/A  Psychiatric Specialty Exam:  Presentation  General Appearance: Appropriate for Environment; Fairly Groomed; Neat  Eye Contact:Fair  Speech:Pressured  Speech Volume:Normal  Handedness:Right   Mood and Affect  Mood:Anxious  Affect:Appropriate; Congruent   Thought Process  Thought Processes:Coherent  Descriptions of Associations:Intact  Orientation:Full (Time, Place and Person)  Thought Content:Logical; Abstract Reasoning  History of Schizophrenia/Schizoaffective disorder:No  Duration of Psychotic Symptoms:Less than six  months  Hallucinations:No data recorded Ideas of Reference:None  Suicidal Thoughts:No data recorded Homicidal Thoughts:No data recorded  Sensorium  Memory:Immediate Good; Recent Good; Remote Poor; Remote Good  Judgment:Fair  Insight:Fair   Executive Functions  Concentration:Fair  Attention Span:Fair  Recall:Fair  Fund of Knowledge:Fair  Language:Fair   Psychomotor  Activity  Psychomotor Activity:No data recorded  Assets  Assets:Communication Skills; Desire for Improvement; Financial Resources/Insurance; Housing; Social Support   Sleep  Sleep:No data recorded   Physical Exam: Physical Exam Vitals and nursing note reviewed.  Constitutional:      Appearance: Normal appearance.  HENT:     Head: Normocephalic and atraumatic.     Mouth/Throat:     Pharynx: Oropharynx is clear.  Eyes:     Pupils: Pupils are equal, round, and reactive to light.  Cardiovascular:     Rate and Rhythm: Normal rate and regular rhythm.  Pulmonary:     Effort: Pulmonary effort is normal.     Breath sounds: Normal breath sounds.  Abdominal:     General: Abdomen is flat.     Palpations: Abdomen is soft.  Musculoskeletal:        General: Normal range of motion.  Skin:    General: Skin is warm and dry.  Neurological:     General: No focal deficit present.     Mental Status: He is alert. Mental status is at baseline.  Psychiatric:        Attention and Perception: Attention normal.        Mood and Affect: Mood normal. Affect is blunt.        Speech: Speech is delayed.        Behavior: Behavior is slowed.        Thought Content: Thought content normal.   Review of Systems  Constitutional: Negative.   HENT: Negative.    Eyes: Negative.   Respiratory: Negative.    Cardiovascular: Negative.   Gastrointestinal: Negative.   Musculoskeletal: Negative.   Skin: Negative.   Neurological: Negative.   Psychiatric/Behavioral:  Positive for hallucinations. Negative for depression, memory  loss, substance abuse and suicidal ideas. The patient is not nervous/anxious and does not have insomnia.   Blood pressure 127/81, pulse 85, temperature 97.6 F (36.4 C), temperature source Oral, resp. rate 18, height 6\' 1"  (1.854 m), weight 67.6 kg, SpO2 98 %. Body mass index is 19.66 kg/m.   Treatment Plan Summary: Plan no change to medicine today.  Reassess and talk with treatment team tomorrow.  May consider discharge in 1 to 2 days since he is much more stable and does not appear to be as dangerous.  We will need to make sure he has appropriate outpatient treatment in place  , MD 05/24/2021, 4:20 PM

## 2021-05-24 NOTE — Progress Notes (Signed)
Recreation Therapy Notes   Date: 05/24/2021  Time: 10:00 am    Location: Craft room   Behavioral response: N/A   Intervention Topic: Problem Solving    Discussion/Intervention: Patient did not attend group.   Clinical Observations/Feedback:  Patient did not attend group.   Oliva Montecalvo LRT/CTRS        Jamee Pacholski 05/24/2021 11:40 AM
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
## 2021-05-25 DIAGNOSIS — F312 Bipolar disorder, current episode manic severe with psychotic features: Secondary | ICD-10-CM | POA: Diagnosis not present

## 2021-05-25 MED ORDER — SENNOSIDES-DOCUSATE SODIUM 8.6-50 MG PO TABS
2.0000 | ORAL_TABLET | Freq: Once | ORAL | Status: AC
  Administered 2021-05-25: 2 via ORAL
  Filled 2021-05-25: qty 2

## 2021-05-25 MED ORDER — SENNOSIDES-DOCUSATE SODIUM 8.6-50 MG PO TABS: 2.0000 | ORAL_TABLET | Freq: Every evening | ORAL | Status: DC | PRN

## 2021-05-25 MED ORDER — DOCUSATE SODIUM 100 MG PO CAPS
200.0000 mg | ORAL_CAPSULE | Freq: Two times a day (BID) | ORAL | Status: DC
  Administered 2021-05-25 – 2021-05-27 (×4): 200 mg via ORAL
  Filled 2021-05-25 (×4): qty 2

## 2021-05-25 NOTE — Progress Notes (Signed)
Recreation Therapy Notes  Date: 05/25/2021  Time: 10:30am    Location:  Craft room    Behavioral response: N/A   Intervention Topic: Time management    Discussion/Intervention: Patient unable to attend group.  Clinical Observations/Feedback:  Patient unable to attend group.   Amontae Ng LRT/CTRS        Fernande Treiber 05/25/2021 12:14 PM

## 2021-05-25 NOTE — Plan of Care (Signed)
  Problem: Group Participation Goal: STG - Patient will engage in groups without prompting or encouragement from LRT x3 group sessions within 5 recreation therapy group sessions Description: STG - Patient will engage in groups without prompting or encouragement from LRT x3 group sessions within 5 recreation therapy group sessions Outcome: Not Progressing   

## 2021-05-25 NOTE — Progress Notes (Signed)
Patient alert and oriented x 2 with periods of confusion to time and situation, he was redirected as needed, affect is flat but brightens upon approach, he denies SI/HI/AVH, complaint with medication regimen no distress noted 15 minutes safety checks maintained will continue to monitor

## 2021-05-25 NOTE — Progress Notes (Signed)
[facility] MD Progress Note  05/25/2021 1:29 PM Nicholas Nielsen  MRN:  [id number] Subjective: Follow-up for this 40 year old man with bipolar disorder with psychosis.  No behavior problems but continues to stay isolated almost all of the time.  Speaking with him today he told me the same thing that he says every day which that he is "a little better".  Tellingly however, he also talked at some length about how when he gets out of the hospital he wants to turn himself into the federal law enforcement authorities because he is convinced that there are warrants out for his arrest.  When asked what it is he thinks he is wanted for has some disorganized nonsensical explanation for it.  He does not have a lot of emotion around it but is still delusional.  I worried that he will continue to act on his delusions outside the hospital to his own detriment.  Remarkably he seems to be taking 6 mg of Risperdal twice a day without any side effects at all.  Does not seem to be having akathisia or extrapyramidal symptoms.  He does complain of constipation.  Says he has not had a bowel movement in a couple days and is having significant abdominal and back discomfort Principal Problem: Bipolar affective disorder, currently manic, severe, with psychotic features (HCC) Diagnosis: Principal Problem:   Bipolar affective disorder, currently manic, severe, with psychotic features (HCC) Active Problems:   Cannabis use disorder, moderate, dependence (HCC)  Total Time spent with patient: 30 minutes  Past Psychiatric History: Past history of bipolar disorder with psychotic symptoms versus schizoaffective disorder  Past Medical History:  Past Medical History:  Diagnosis Date   Back pain    Bipolar 1 disorder (HCC)    Hepatitis C    Kidney stones    Schizo affective schizophrenia (HCC)    Testicular cyst    History reviewed. No pertinent surgical history. Family History:  Family History  Problem Relation Age of Onset    Stroke Father    Hypertension Father    Family Psychiatric  History: See previous Social History:  Social History   Substance and Sexual Activity  Alcohol Use Not Currently     Social History   Substance and Sexual Activity  Drug Use Yes   Frequency: 7.0 times per week   Types: Marijuana   Comment: Daily use; last use is 03/26/2019    Social History   Socioeconomic History   Marital status: Single    Spouse name: Not on file   Number of children: Not on file   Years of education: Not on file   Highest education level: Not on file  Occupational History   Occupation: Unemployed  Tobacco Use   Smoking status: Every Day    Packs/day: 1.00    Years: 15.00    Pack years: 15.00    Types: Cigarettes   Smokeless tobacco: Never  Vaping Use   Vaping Use: Never used  Substance and Sexual Activity   Alcohol use: Not Currently   Drug use: Yes    Frequency: 7.0 times per week    Types: Marijuana    Comment: Daily use; last use is 03/26/2019   Sexual activity: Never    Birth control/protection: Condom  Other Topics Concern   Not on file  Social History Narrative   Pt stated that he is currently living with his brother.  He lives in [place] and is unemployed.  Pt stated that he recently arranged an appointment with  [facility].   Social Determinants of Health   Financial Resource Strain: Not on file  Food Insecurity: Not on file  Transportation Needs: Not on file  Physical Activity: Not on file  Stress: Not on file  Social Connections: Not on file   Additional Social History:                         Sleep: Fair  Appetite:  Fair  Current Medications: Current Facility-Administered Medications  Medication Dose Route Frequency Provider Last Rate Last Admin   acetaminophen (TYLENOL) tablet 650 mg  650 mg Oral Q6H PRN Tyheem Boughner T, MD   650 mg at 05/06/21 0741   alum & mag hydroxide-simeth (MAALOX/MYLANTA) 200-200-20 MG/5ML suspension 30 mL  30 mL Oral Q4H PRN  Ketan Renz T, MD       amLODipine (NORVASC) tablet 5 mg  5 mg Oral Daily Les Pou M, MD   5 mg at 05/25/21 0843   benztropine (COGENTIN) tablet 1 mg  1 mg Oral BID Malik, Kashif   1 mg at 05/25/21 0844   docusate sodium (COLACE) capsule 200 mg  200 mg Oral BID Erynn Vaca T, MD       feeding supplement (ENSURE ENLIVE / ENSURE PLUS) liquid 237 mL  237 mL Oral TID BM Jesse Sans, MD   237 mL at 05/25/21 1249   hydrALAZINE (APRESOLINE) tablet 10 mg  10 mg Oral Q6H PRN Jesse Sans, MD   10 mg at 05/08/21 4562   hydrOXYzine (ATARAX/VISTARIL) tablet 50 mg  50 mg Oral Q6H PRN Carlisle Enke T, MD   50 mg at 05/23/21 1755   lithium carbonate (LITHOBID) CR tablet 600 mg  600 mg Oral Q12H Romelo Sciandra T, MD   600 mg at 05/25/21 0837   magnesium hydroxide (MILK OF MAGNESIA) suspension 30 mL  30 mL Oral Daily PRN Treyden Hakim T, MD   30 mL at 05/25/21 0835   nicotine (NICODERM CQ - dosed in mg/24 hours) patch 14 mg  14 mg Transdermal Daily Giovana Faciane T, MD   14 mg at 05/25/21 0838   OLANZapine (ZYPREXA) tablet 10 mg  10 mg Oral Q6H PRN Jesse Sans, MD   10 mg at 05/23/21 1755   propranolol (INDERAL) tablet 20 mg  20 mg Oral BID Jesse Sans, MD   20 mg at 05/25/21 0843   risperiDONE (RISPERDAL) tablet 6 mg  6 mg Oral BID Kerriann Kamphuis, Jackquline Denmark, MD   6 mg at 05/25/21 5638   senna-docusate (Senokot-S) tablet 2 tablet  2 tablet Oral QHS PRN Cheryal Salas, Jackquline Denmark, MD       temazepam (RESTORIL) capsule 15 mg  15 mg Oral QHS PRN Gavinn Collard, Jackquline Denmark, MD   15 mg at 05/24/21 2108   ziprasidone (GEODON) injection 20 mg  20 mg Intramuscular Q6H PRN Jesse Sans, MD        Lab Results: No results found for this or any previous visit (from the past 48 hour(s)).  Blood Alcohol level:  Lab Results  Component Value Date   ETH <10 03/26/2019   ETH <11 10/11/2011    Metabolic Disorder Labs: Lab Results  Component Value Date   HGBA1C 5.0 05/05/2021   MPG 96.8 05/05/2021   No results found for:  PROLACTIN Lab Results  Component Value Date   CHOL 202 (H) 05/05/2021   TRIG 83 05/05/2021   HDL 35 (L) 05/05/2021  CHOLHDL 5.8 05/05/2021   VLDL 17 05/05/2021   LDLCALC 150 (H) 05/05/2021    Physical Findings: AIMS:  , ,  ,  ,    CIWA:    COWS:     Musculoskeletal: Strength & Muscle Tone: within normal limits Gait & Station: normal Patient leans: N/A  Psychiatric Specialty Exam:  Presentation  General Appearance: Appropriate for Environment; Fairly Groomed; Neat  Eye Contact:Fair  Speech:Pressured  Speech Volume:Normal  Handedness:Right   Mood and Affect  Mood:Anxious  Affect:Appropriate; Congruent   Thought Process  Thought Processes:Coherent  Descriptions of Associations:Intact  Orientation:Full (Time, Place and Person)  Thought Content:Logical; Abstract Reasoning  History of Schizophrenia/Schizoaffective disorder:No  Duration of Psychotic Symptoms:Less than six months  Hallucinations:No data recorded Ideas of Reference:None  Suicidal Thoughts:No data recorded Homicidal Thoughts:No data recorded  Sensorium  Memory:Immediate Good; Recent Good; Remote Poor; Remote Good  Judgment:Fair  Insight:Fair   Executive Functions  Concentration:Fair  Attention Span:Fair  Recall:Fair  Fund of Knowledge:Fair  Language:Fair   Psychomotor Activity  Psychomotor Activity:No data recorded  Assets  Assets:Communication Skills; Desire for Improvement; Financial Resources/Insurance; Housing; Social Support   Sleep  Sleep:No data recorded   Physical Exam: Physical Exam Vitals and nursing note reviewed.  Constitutional:      Appearance: Normal appearance.  HENT:     Head: Normocephalic and atraumatic.     Mouth/Throat:     Pharynx: Oropharynx is clear.  Eyes:     Pupils: Pupils are equal, round, and reactive to light.  Cardiovascular:     Rate and Rhythm: Normal rate and regular rhythm.  Pulmonary:     Effort: Pulmonary effort is  normal.     Breath sounds: Normal breath sounds.  Abdominal:     General: Abdomen is flat.     Palpations: Abdomen is soft.  Musculoskeletal:        General: Normal range of motion.  Skin:    General: Skin is warm and dry.  Neurological:     General: No focal deficit present.     Mental Status: He is alert. Mental status is at baseline.  Psychiatric:        Attention and Perception: He is inattentive.        Mood and Affect: Mood normal. Affect is blunt.        Speech: Speech is tangential.        Behavior: Behavior is withdrawn.        Thought Content: Thought content is paranoid and delusional.        Cognition and Memory: Memory is impaired.   Review of Systems  Constitutional: Negative.   HENT: Negative.    Eyes: Negative.   Respiratory: Negative.    Cardiovascular: Negative.   Gastrointestinal:  Positive for constipation.  Musculoskeletal: Negative.   Skin: Negative.   Neurological: Negative.   Psychiatric/Behavioral:  Negative for depression, hallucinations, substance abuse and suicidal ideas. The patient is nervous/anxious.   Blood pressure 106/75, pulse (!) 108, temperature 98 F (36.7 C), temperature source Oral, resp. rate 18, height 6\' 1"  (1.854 m), weight 67.6 kg, SpO2 98 %. Body mass index is 19.66 kg/m.   Treatment Plan Summary: Medication management and Plan unfortunate gentleman seems at the moment to have plateaued in his improvement.  He is not having the intense mood swings but he is still delusional.  Tolerating medicine fine other than the constipation.  He is at a pretty high dose of Risperdal so I am not really inclined to  go up on it so much as to wait or possibly add a different antipsychotic.  With his constipation though I do not want to risk something that is going to make that worse like olanzapine right now.  I have ordered Senokot immediately and as needed and told him that he can ask for it again tonight if needed.  Also added Colace twice a day.   I had been thinking about possible discharge but with his obvious delusions I think he is still at high risk of getting himself in trouble or winding up back in the hospital and it is better to continue with treatment.  Mordecai Rasmussen, MD 05/25/2021, 1:29 PM

## 2021-05-25 NOTE — Group Note (Signed)
[facility] LCSW Group Therapy Note   Group Date: 05/25/2021 Start Time: 1310 End Time: 1400   Type of Therapy/Topic:  Group Therapy:  Emotion Regulation  Participation Level:  Did Not Attend   Mood:  Description of Group:    The purpose of this group is to assist patients in learning to regulate negative emotions and experience positive emotions. Patients will be guided to discuss ways in which they have been vulnerable to their negative emotions. These vulnerabilities will be juxtaposed with experiences of positive emotions or situations, and patients challenged to use positive emotions to combat negative ones. Special emphasis will be placed on coping with negative emotions in conflict situations, and patients will process healthy conflict resolution skills.  Therapeutic Goals: Patient will identify two positive emotions or experiences to reflect on in order to balance out negative emotions:  Patient will label two or more emotions that they find the most difficult to experience:  Patient will be able to demonstrate positive conflict resolution skills through discussion or role plays:   Summary of Patient Progress: X  Therapeutic Modalities:   Cognitive Behavioral Therapy Feelings Identification Dialectical Behavioral Therapy   Bonniejean Piano R Arlana Canizales, LCSW 

## 2021-05-25 NOTE — Progress Notes (Signed)
Pt has been alert and oriented to person, place, time and situation. Pt is calm, cooperative, denies SI/HI/AVH. Pt reports depression and anxiety is 1/10 on a 0-10 scale, 10 being worst. Pt is medication compliant. Pt reports constipation and is given PRN and scheduled medications for this, and offered PO fluids. Pt spends a most of his time quietly resting in his room, out for meals. Will continue to monitor pt per Q15 minute face checks and monitor for safety and progress.

## 2021-05-26 MED ORDER — GLYCERIN (LAXATIVE) 2 G RE SUPP
1.0000 | Freq: Two times a day (BID) | RECTAL | Status: DC
  Administered 2021-05-26: 1 via RECTAL
  Filled 2021-05-26 (×4): qty 1

## 2021-05-26 NOTE — Progress Notes (Signed)
Patient stated he had a liquid BM and did not want to take the second suppository ordered. Pt is calm, cooperative and medication complaint. Will continue to monitor with q 15 minute safety checks.

## 2021-05-26 NOTE — Progress Notes (Signed)
[facility] MD Progress Note  05/26/2021 11:06 AM Nicholas Nielsen  MRN:  [id number] Subjective: Nicholas Nielsen is doing okay.  His biggest complaint is constipation.  Has been on Senokot-S without relief.  He states that his mood is improving.  He denies any suicidal ideation.  Principal Problem: Bipolar affective disorder, currently manic, severe, with psychotic features (HCC) Diagnosis: Principal Problem:   Bipolar affective disorder, currently manic, severe, with psychotic features (HCC) Active Problems:   Cannabis use disorder, moderate, dependence (HCC)  Total Time spent with patient: 15 minutes  Past Psychiatric History: Unremarkable  Past Medical History:  Past Medical History:  Diagnosis Date   Back pain    Bipolar 1 disorder (HCC)    Hepatitis C    Kidney stones    Schizo affective schizophrenia [facility])    Testicular cyst    History reviewed. No pertinent surgical history. Family History:  Family History  Problem Relation Age of Onset   Stroke Father    Hypertension Father    Family Psychiatric  History: Unremarkable Social History:  Social History   Substance and Sexual Activity  Alcohol Use Not Currently     Social History   Substance and Sexual Activity  Drug Use Yes   Frequency: 7.0 times per week   Types: Marijuana   Comment: Daily use; last use is 03/26/2019    Social History   Socioeconomic History   Marital status: Single    Spouse name: Not on file   Number of children: Not on file   Years of education: Not on file   Highest education level: Not on file  Occupational History   Occupation: Unemployed  Tobacco Use   Smoking status: Every Day    Packs/day: 1.00    Years: 15.00    Pack years: 15.00    Types: Cigarettes   Smokeless tobacco: Never  Vaping Use   Vaping Use: Never used  Substance and Sexual Activity   Alcohol use: Not Currently   Drug use: Yes    Frequency: 7.0 times per week    Types: Marijuana    Comment: Daily use; last use is  03/26/2019   Sexual activity: Never    Birth control/protection: Condom  Other Topics Concern   Not on file  Social History Narrative   Pt stated that he is currently living with his brother.  He lives in [place] and is unemployed.  Pt stated that he recently arranged an appointment with [facility].   Social Determinants of Health   Financial Resource Strain: Not on file  Food Insecurity: Not on file  Transportation Needs: Not on file  Physical Activity: Not on file  Stress: Not on file  Social Connections: Not on file   Additional Social History:                         Sleep: Good  Appetite:  Good  Current Medications: Current Facility-Administered Medications  Medication Dose Route Frequency Provider Last Rate Last Admin   acetaminophen (TYLENOL) tablet 650 mg  650 mg Oral Q6H PRN Clapacs, John T, MD   650 mg at 05/26/21 0818   alum & mag hydroxide-simeth (MAALOX/MYLANTA) 200-200-20 MG/5ML suspension 30 mL  30 mL Oral Q4H PRN Clapacs, John T, MD       amLODipine (NORVASC) tablet 5 mg  5 mg Oral Daily Jesse Sans, MD   5 mg at 05/26/21 0816   benztropine (COGENTIN) tablet 1 mg  1 mg  Oral BID Danelle Earthly, Kashif   1 mg at 05/26/21 0816   docusate sodium (COLACE) capsule 200 mg  200 mg Oral BID Clapacs, John T, MD   200 mg at 05/26/21 0815   feeding supplement (ENSURE ENLIVE / ENSURE PLUS) liquid 237 mL  237 mL Oral TID BM Jesse Sans, MD   237 mL at 05/26/21 0831   Glycerin (Adult) 2 g suppository 1 suppository  1 suppository Rectal BID Sarina Ill, DO       hydrALAZINE (APRESOLINE) tablet 10 mg  10 mg Oral Q6H PRN Jesse Sans, MD   10 mg at 05/08/21 6144   hydrOXYzine (ATARAX/VISTARIL) tablet 50 mg  50 mg Oral Q6H PRN Clapacs, Jackquline Denmark, MD   50 mg at 05/23/21 1755   lithium carbonate (LITHOBID) CR tablet 600 mg  600 mg Oral Q12H Clapacs, John T, MD   600 mg at 05/26/21 0816   magnesium hydroxide (MILK OF MAGNESIA) suspension 30 mL  30 mL Oral Daily  PRN Clapacs, John T, MD   30 mL at 05/25/21 0835   nicotine (NICODERM CQ - dosed in mg/24 hours) patch 14 mg  14 mg Transdermal Daily Clapacs, Jackquline Denmark, MD   14 mg at 05/26/21 0816   OLANZapine (ZYPREXA) tablet 10 mg  10 mg Oral Q6H PRN Jesse Sans, MD   10 mg at 05/23/21 1755   propranolol (INDERAL) tablet 20 mg  20 mg Oral BID Jesse Sans, MD   20 mg at 05/26/21 0816   risperiDONE (RISPERDAL) tablet 6 mg  6 mg Oral BID Clapacs, Jackquline Denmark, MD   6 mg at 05/26/21 0816   senna-docusate (Senokot-S) tablet 2 tablet  2 tablet Oral QHS PRN Clapacs, Jackquline Denmark, MD       temazepam (RESTORIL) capsule 15 mg  15 mg Oral QHS PRN Clapacs, Jackquline Denmark, MD   15 mg at 05/25/21 2119   ziprasidone (GEODON) injection 20 mg  20 mg Intramuscular Q6H PRN Jesse Sans, MD        Lab Results: No results found for this or any previous visit (from the past 48 hour(s)).  Blood Alcohol level:  Lab Results  Component Value Date   [facility] <10 03/26/2019   ETH <11 10/11/2011    Metabolic Disorder Labs: Lab Results  Component Value Date   HGBA1C 5.0 05/05/2021   MPG 96.8 05/05/2021   No results found for: PROLACTIN Lab Results  Component Value Date   CHOL 202 (H) 05/05/2021   TRIG 83 05/05/2021   HDL 35 (L) 05/05/2021   CHOLHDL 5.8 05/05/2021   VLDL 17 05/05/2021   LDLCALC 150 (H) 05/05/2021    Physical Findings: AIMS:  , ,  ,  ,    CIWA:    COWS:     Musculoskeletal: Strength & Muscle Tone: within normal limits Gait & Station: normal Patient leans: N/A  Psychiatric Specialty Exam:  Presentation  General Appearance: Appropriate for Environment; Fairly Groomed; Neat  Eye Contact:Fair  Speech:Pressured  Speech Volume:Normal  Handedness:Right   Mood and Affect  Mood:Anxious  Affect:Appropriate; Congruent   Thought Process  Thought Processes:Coherent  Descriptions of Associations:Intact  Orientation:Full (Time, Place and Person)  Thought Content:Logical; Abstract Reasoning  History  of Schizophrenia/Schizoaffective disorder:No  Duration of Psychotic Symptoms:Less than six months  Hallucinations:No data recorded Ideas of Reference:None  Suicidal Thoughts:No data recorded Homicidal Thoughts:No data recorded  Sensorium  Memory:Immediate Good; Recent Good; Remote Poor; Remote Good  Judgment:Fair  Insight:Fair   Executive Functions  Concentration:Fair  Attention Span:Fair  Recall:Fair  Fund of Knowledge:Fair  Language:Fair   Psychomotor Activity  Psychomotor Activity:No data recorded  Assets  Assets:Communication Skills; Desire for Improvement; Financial Resources/Insurance; Housing; Social Support   Sleep  Sleep:No data recorded   Physical Exam: Physical Exam Vitals and nursing note reviewed.  Constitutional:      Appearance: Normal appearance. He is normal weight.  Neurological:     General: No focal deficit present.     Mental Status: He is alert and oriented to person, place, and time.  Psychiatric:        Attention and Perception: Attention and perception normal.        Mood and Affect: Mood is anxious.        Speech: Speech normal.        Behavior: Behavior normal. Behavior is cooperative.        Thought Content: Thought content normal.        Cognition and Memory: Cognition and memory normal.        Judgment: Judgment normal.   ROS Blood pressure (!) 122/91, pulse (!) 103, temperature 98 F (36.7 C), temperature source Oral, resp. rate 18, height 6\' 1"  (1.854 m), weight 67.6 kg, SpO2 98 %. Body mass index is 19.66 kg/m.   Treatment Plan Summary: Daily contact with patient to assess and evaluate symptoms and progress in treatment, Medication management, and Plan glycerin suppository twice daily until he does not need it anymore.  Continue current medications.  Greco Gastelum , DO 05/26/2021, 11:06 AM

## 2021-05-26 NOTE — Progress Notes (Signed)
Patient isolates to his room but comes out periodically to pace.  Minimal interaction with staff and others on the unit. He is med compliant and receives his meds without incident.  He denies si/hi/avh.  He does endorse anxiety and depression but reports "just a little" Will continue to monitor q15 minute safety checks.     Cleo Betker-Nicholson, LPN

## 2021-05-26 NOTE — Progress Notes (Addendum)
Patient is calm and cooperative at this time. He said his anxiety is a 3/10. Pt denies SI, HI and AVH. Pt said he has not had a BM for the past 3 days and asked his doctor for a suppository which was ordered and administered. Pt complained of pain while trying to have a BM. Tylenol PRN administered without incident. Will monitor with q 15 minute safety checks.

## 2021-05-27 DIAGNOSIS — F312 Bipolar disorder, current episode manic severe with psychotic features: Secondary | ICD-10-CM | POA: Diagnosis not present

## 2021-05-27 NOTE — Progress Notes (Signed)
Pt very quiet, at present he denies any S/HI or A/V hallucinations. Pleasant on approach and cooperative. He eats in the dinning room andhe has  been in the TV room for brief periods of time.

## 2021-05-27 NOTE — Progress Notes (Signed)
Pt very quiet, but easy to engage. He stated he is still having thoughts that the police is out to get him. He reports lots having "lots of thoughts in his head." Pt  appears to be be preoccupied with internal stimuli. He denies SI/HI, and A/V hallucinations. His affect is flat to sad. His goal is to "take medication and worship the [employer] on the unit, but very quiet, less spoken to. Pt stated he has had 4 BM's since last night and that kept him up last night.

## 2021-05-27 NOTE — Progress Notes (Signed)
[facility] MD Progress Note  05/27/2021 1:34 PM Nicholas Nielsen  MRN:  [id number] Subjective: Follow-up for this patient with bipolar or schizoaffective disorder in the hospital with psychosis.  As always he tells me that he is "better".  Denies any hallucinations today.  Staff report that he continues to show signs of psychotically driven behavior at times.  Some of the things he mentions to me suggests that he still believes that there are people who come to visit him and speak in his ear.  He still does not get out of his room very much.  From a physical standpoint his constipation is resolved having taken the Senokot and now he has had multiple bowel movements and is requesting discontinuation of the Colace. Principal Problem: Bipolar affective disorder, currently manic, severe, with psychotic features (HCC) Diagnosis: Principal Problem:   Bipolar affective disorder, currently manic, severe, with psychotic features (HCC) Active Problems:   Cannabis use disorder, moderate, dependence (HCC)  Total Time spent with patient: 30 minutes  Past Psychiatric History: Past history of psychotic disorder diagnosis bipolar  Past Medical History:  Past Medical History:  Diagnosis Date   Back pain    Bipolar 1 disorder (HCC)    Hepatitis C    Kidney stones    Schizo affective schizophrenia (HCC)    Testicular cyst    History reviewed. No pertinent surgical history. Family History:  Family History  Problem Relation Age of Onset   Stroke Father    Hypertension Father    Family Psychiatric  History: See previous Social History:  Social History   Substance and Sexual Activity  Alcohol Use Not Currently     Social History   Substance and Sexual Activity  Drug Use Yes   Frequency: 7.0 times per week   Types: Marijuana   Comment: Daily use; last use is 03/26/2019    Social History   Socioeconomic History   Marital status: Single    Spouse name: Not on file   Number of children: Not on file    Years of education: Not on file   Highest education level: Not on file  Occupational History   Occupation: Unemployed  Tobacco Use   Smoking status: Every Day    Packs/day: 1.00    Years: 15.00    Pack years: 15.00    Types: Cigarettes   Smokeless tobacco: Never  Vaping Use   Vaping Use: Never used  Substance and Sexual Activity   Alcohol use: Not Currently   Drug use: Yes    Frequency: 7.0 times per week    Types: Marijuana    Comment: Daily use; last use is 03/26/2019   Sexual activity: Never    Birth control/protection: Condom  Other Topics Concern   Not on file  Social History Narrative   Pt stated that he is currently living with his brother.  He lives in [place] and is unemployed.  Pt stated that he recently arranged an appointment with [facility].   Social Determinants of Health   Financial Resource Strain: Not on file  Food Insecurity: Not on file  Transportation Needs: Not on file  Physical Activity: Not on file  Stress: Not on file  Social Connections: Not on file   Additional Social History:                         Sleep: Fair  Appetite:  Fair  Current Medications: Current Facility-Administered Medications  Medication Dose Route Frequency Provider  Last Rate Last Admin   acetaminophen (TYLENOL) tablet 650 mg  650 mg Oral Q6H PRN Kadejah Sandiford T, MD   650 mg at 05/26/21 0818   alum & mag hydroxide-simeth (MAALOX/MYLANTA) 200-200-20 MG/5ML suspension 30 mL  30 mL Oral Q4H PRN Akera Snowberger T, MD       amLODipine (NORVASC) tablet 5 mg  5 mg Oral Daily Les Pou M, MD   5 mg at 05/27/21 0816   benztropine (COGENTIN) tablet 1 mg  1 mg Oral BID Danelle Earthly, Kashif   1 mg at 05/27/21 0815   docusate sodium (COLACE) capsule 200 mg  200 mg Oral BID Jkwon Treptow T, MD   200 mg at 05/27/21 0815   feeding supplement (ENSURE ENLIVE / ENSURE PLUS) liquid 237 mL  237 mL Oral TID BM Jesse Sans, MD   237 mL at 05/27/21 1017   Glycerin (Adult) 2 g  suppository 1 suppository  1 suppository Rectal BID Sarina Ill, DO   1 suppository at 05/26/21 1345   hydrALAZINE (APRESOLINE) tablet 10 mg  10 mg Oral Q6H PRN Jesse Sans, MD   10 mg at 05/08/21 2482   hydrOXYzine (ATARAX/VISTARIL) tablet 50 mg  50 mg Oral Q6H PRN Codey Burling, Jackquline Denmark, MD   50 mg at 05/23/21 1755   lithium carbonate (LITHOBID) CR tablet 600 mg  600 mg Oral Q12H Tonny Isensee T, MD   600 mg at 05/27/21 0814   magnesium hydroxide (MILK OF MAGNESIA) suspension 30 mL  30 mL Oral Daily PRN Deano Tomaszewski T, MD   30 mL at 05/25/21 0835   nicotine (NICODERM CQ - dosed in mg/24 hours) patch 14 mg  14 mg Transdermal Daily Waco Foerster T, MD   14 mg at 05/27/21 0813   OLANZapine (ZYPREXA) tablet 10 mg  10 mg Oral Q6H PRN Jesse Sans, MD   10 mg at 05/23/21 1755   propranolol (INDERAL) tablet 20 mg  20 mg Oral BID Jesse Sans, MD   20 mg at 05/27/21 0815   risperiDONE (RISPERDAL) tablet 6 mg  6 mg Oral BID Lenox Ladouceur T, MD   6 mg at 05/27/21 0813   senna-docusate (Senokot-S) tablet 2 tablet  2 tablet Oral QHS PRN Hamza Empson, Jackquline Denmark, MD       temazepam (RESTORIL) capsule 15 mg  15 mg Oral QHS PRN Joy Haegele, Jackquline Denmark, MD   15 mg at 05/26/21 2008   ziprasidone (GEODON) injection 20 mg  20 mg Intramuscular Q6H PRN Jesse Sans, MD        Lab Results: No results found for this or any previous visit (from the past 48 hour(s)).  Blood Alcohol level:  Lab Results  Component Value Date   [facility] <10 03/26/2019   ETH <11 10/11/2011    Metabolic Disorder Labs: Lab Results  Component Value Date   HGBA1C 5.0 05/05/2021   MPG 96.8 05/05/2021   No results found for: PROLACTIN Lab Results  Component Value Date   CHOL 202 (H) 05/05/2021   TRIG 83 05/05/2021   HDL 35 (L) 05/05/2021   CHOLHDL 5.8 05/05/2021   VLDL 17 05/05/2021   LDLCALC 150 (H) 05/05/2021    Physical Findings: AIMS:  , ,  ,  ,    CIWA:    COWS:     Musculoskeletal: Strength & Muscle Tone: within  normal limits Gait & Station: normal Patient leans: N/A  Psychiatric Specialty Exam:  Presentation  General Appearance:  Appropriate for Environment; Fairly Groomed; Neat  Eye Contact:Fair  Speech:Pressured  Speech Volume:Normal  Handedness:Right   Mood and Affect  Mood:Anxious  Affect:Appropriate; Congruent   Thought Process  Thought Processes:Coherent  Descriptions of Associations:Intact  Orientation:Full (Time, Place and Person)  Thought Content:Logical; Abstract Reasoning  History of Schizophrenia/Schizoaffective disorder:No  Duration of Psychotic Symptoms:Less than six months  Hallucinations:No data recorded Ideas of Reference:None  Suicidal Thoughts:No data recorded Homicidal Thoughts:No data recorded  Sensorium  Memory:Immediate Good; Recent Good; Remote Poor; Remote Good  Judgment:Fair  Insight:Fair   Executive Functions  Concentration:Fair  Attention Span:Fair  Recall:Fair  Fund of Knowledge:Fair  Language:Fair   Psychomotor Activity  Psychomotor Activity:No data recorded  Assets  Assets:Communication Skills; Desire for Improvement; Financial Resources/Insurance; Housing; Social Support   Sleep  Sleep:No data recorded   Physical Exam: Physical Exam Vitals and nursing note reviewed.  Constitutional:      Appearance: Normal appearance.  HENT:     Head: Normocephalic and atraumatic.     Mouth/Throat:     Pharynx: Oropharynx is clear.  Eyes:     Pupils: Pupils are equal, round, and reactive to light.  Cardiovascular:     Rate and Rhythm: Normal rate and regular rhythm.  Pulmonary:     Effort: Pulmonary effort is normal.     Breath sounds: Normal breath sounds.  Abdominal:     General: Abdomen is flat.     Palpations: Abdomen is soft.  Musculoskeletal:        General: Normal range of motion.  Skin:    General: Skin is warm and dry.  Neurological:     General: No focal deficit present.     Mental Status: He is alert.  Mental status is at baseline.  Psychiatric:        Attention and Perception: Attention normal.        Mood and Affect: Mood normal. Affect is blunt.        Speech: Speech is delayed.        Behavior: Behavior is slowed.        Thought Content: Thought content normal.        Cognition and Memory: Memory is impaired.   Review of Systems  Constitutional: Negative.   HENT: Negative.    Eyes: Negative.   Respiratory: Negative.    Cardiovascular: Negative.   Gastrointestinal: Negative.   Musculoskeletal: Negative.   Skin: Negative.   Neurological: Negative.   Psychiatric/Behavioral: Negative.    Blood pressure 124/82, pulse 87, temperature 98 F (36.7 C), temperature source Oral, resp. rate 17, height 6\' 1"  (1.854 m), weight 67.6 kg, SpO2 98 %. Body mass index is 19.66 kg/m.   Treatment Plan Summary: Plan no change to psychiatric medicine.  Gradual improvement certainly has happened.  High doses already in place of medicine.  As far as the constipation we will discontinue the standing medicines for it.  Patient educated that it may come back at which point the Colace would be a good first choice.  Encourage group attendance and coming out of his room.  May be looking at discharge still within a few days  , MD 05/27/2021, 1:34 PM

## 2021-05-27 NOTE — Progress Notes (Signed)
Patient alert and oriented x 4, affect is flat but brightens upon approach, he denies SI/HI/AVH, complaint with medication regimen no distress noted 15 minutes safety checks maintained will continue to monitor.

## 2021-05-27 NOTE — Plan of Care (Signed)
  Problem: Education: Goal: Knowledge of [facility] Education information/materials will improve Outcome: Progressing Goal: Emotional status will improve Outcome: Progressing Goal: Mental status will improve Outcome: Progressing Goal: Verbalization of understanding the information provided will improve Outcome: Progressing   Problem: Activity: Goal: Interest or engagement in activities will improve Outcome: Progressing Goal: Sleeping patterns will improve Outcome: Progressing   Problem: Coping: Goal: Ability to verbalize frustrations and anger appropriately will improve Outcome: Progressing Goal: Ability to demonstrate self-control will improve Outcome: Progressing   Problem: Health Behavior/Discharge Planning: Goal: Identification of resources available to assist in meeting health care needs will improve Outcome: Progressing Goal: Compliance with treatment plan for underlying cause of condition will improve Outcome: Progressing   Problem: Physical Regulation: Goal: Ability to maintain clinical measurements within normal limits will improve Outcome: Progressing   Problem: Safety: Goal: Periods of time without injury will increase Outcome: Progressing   Problem: Education: Goal: Ability to state activities that reduce stress will improve Outcome: Progressing   Problem: Coping: Goal: Ability to identify and develop effective coping behavior will improve Outcome: Progressing   Problem: Self-Concept: Goal: Ability to identify factors that promote anxiety will improve Outcome: Progressing Goal: Level of anxiety will decrease Outcome: Progressing Goal: Ability to modify response to factors that promote anxiety will improve Outcome: Progressing   Problem: Activity: Goal: Will verbalize the importance of balancing activity with adequate rest periods Outcome: Progressing   Problem: Education: Goal: Will be free of psychotic symptoms Outcome:  Progressing Goal: Knowledge of the prescribed therapeutic regimen will improve Outcome: Progressing   Problem: Coping: Goal: Coping ability will improve Outcome: Progressing Goal: Will verbalize feelings Outcome: Progressing   Problem: Health Behavior/Discharge Planning: Goal: Compliance with prescribed medication regimen will improve Outcome: Progressing   Problem: Nutritional: Goal: Ability to achieve adequate nutritional intake will improve Outcome: Progressing  See progress notes Problem: Role Relationship: Goal: Ability to communicate needs accurately will improve Outcome: Progressing Goal: Ability to interact with others will improve Outcome: Progressing   Problem: Safety: Goal: Ability to redirect hostility and anger into socially appropriate behaviors will improve Outcome: Progressing Goal: Ability to remain free from injury will improve Outcome: Progressing   Problem: Self-Care: Goal: Ability to participate in self-care as condition permits will improve Outcome: Progressing   Problem: Self-Concept: Goal: Will verbalize positive feelings about self Outcome: Progressing

## 2021-05-28 DIAGNOSIS — F312 Bipolar disorder, current episode manic severe with psychotic features: Secondary | ICD-10-CM | POA: Diagnosis not present

## 2021-05-28 NOTE — Progress Notes (Signed)
[facility] MD Progress Note  05/28/2021 1:42 PM Nicholas Nielsen  MRN:  [id number] Subjective: Follow-up for this patient with bipolar or schizoaffective disorder in the hospital with psychosis.    Ok"  Per nursing- Pt very quiet, at present he denies any S/HI or A/V hallucinations. Pleasant on approach and cooperative. He eats in the dinning room andhe has  been in the TV room for brief periods of time  Pt seen in his room, pt denies any complaints, admits seeing woman/baby at times, paranoid, denies SI/HI.   From a physical standpoint his constipation is resolved having taken the Senokot and now he has had multiple bowel movements and is requesting discontinuation of the Colace. Principal Problem: Bipolar affective disorder, currently manic, severe, with psychotic features (HCC) Diagnosis: Principal Problem:   Bipolar affective disorder, currently manic, severe, with psychotic features (HCC) Active Problems:   Cannabis use disorder, moderate, dependence (HCC)  Total Time spent with patient: 30 minutes  Past Psychiatric History: Past history of psychotic disorder diagnosis bipolar  Past Medical History:  Past Medical History:  Diagnosis Date   Back pain    Bipolar 1 disorder (HCC)    Hepatitis C    Kidney stones    Schizo affective schizophrenia (HCC)    Testicular cyst    History reviewed. No pertinent surgical history. Family History:  Family History  Problem Relation Age of Onset   Stroke Father    Hypertension Father    Family Psychiatric  History: See previous Social History:  Social History   Substance and Sexual Activity  Alcohol Use Not Currently     Social History   Substance and Sexual Activity  Drug Use Yes   Frequency: 7.0 times per week   Types: Marijuana   Comment: Daily use; last use is 03/26/2019    Social History   Socioeconomic History   Marital status: Single    Spouse name: Not on file   Number of children: Not on file   Years of education: Not on  file   Highest education level: Not on file  Occupational History   Occupation: Unemployed  Tobacco Use   Smoking status: Every Day    Packs/day: 1.00    Years: 15.00    Pack years: 15.00    Types: Cigarettes   Smokeless tobacco: Never  Vaping Use   Vaping Use: Never used  Substance and Sexual Activity   Alcohol use: Not Currently   Drug use: Yes    Frequency: 7.0 times per week    Types: Marijuana    Comment: Daily use; last use is 03/26/2019   Sexual activity: Never    Birth control/protection: Condom  Other Topics Concern   Not on file  Social History Narrative   Pt stated that he is currently living with his brother.  He lives in [place] and is unemployed.  Pt stated that he recently arranged an appointment with [facility].   Social Determinants of Health   Financial Resource Strain: Not on file  Food Insecurity: Not on file  Transportation Needs: Not on file  Physical Activity: Not on file  Stress: Not on file  Social Connections: Not on file   Additional Social History:                         Sleep: Fair  Appetite:  Fair  Current Medications: Current Facility-Administered Medications  Medication Dose Route Frequency Provider Last Rate Last Admin   acetaminophen (TYLENOL)  tablet 650 mg  650 mg Oral Q6H PRN Clapacs, John T, MD   650 mg at 05/26/21 0818   alum & mag hydroxide-simeth (MAALOX/MYLANTA) 200-200-20 MG/5ML suspension 30 mL  30 mL Oral Q4H PRN Clapacs, John T, MD       amLODipine (NORVASC) tablet 5 mg  5 mg Oral Daily Les Pou M, MD   5 mg at 05/28/21 0824   benztropine (COGENTIN) tablet 1 mg  1 mg Oral BID Malik, Kashif   1 mg at 05/28/21 0815   feeding supplement (ENSURE ENLIVE / ENSURE PLUS) liquid 237 mL  237 mL Oral TID BM Jesse Sans, MD   237 mL at 05/28/21 1000   hydrALAZINE (APRESOLINE) tablet 10 mg  10 mg Oral Q6H PRN Jesse Sans, MD   10 mg at 05/08/21 5681   hydrOXYzine (ATARAX/VISTARIL) tablet 50 mg  50 mg Oral Q6H  PRN Clapacs, Jackquline Denmark, MD   50 mg at 05/23/21 1755   lithium carbonate (LITHOBID) CR tablet 600 mg  600 mg Oral Q12H Clapacs, John T, MD   600 mg at 05/28/21 0817   magnesium hydroxide (MILK OF MAGNESIA) suspension 30 mL  30 mL Oral Daily PRN Clapacs, John T, MD   30 mL at 05/28/21 1259   nicotine (NICODERM CQ - dosed in mg/24 hours) patch 14 mg  14 mg Transdermal Daily Clapacs, Jackquline Denmark, MD   14 mg at 05/28/21 0817   OLANZapine (ZYPREXA) tablet 10 mg  10 mg Oral Q6H PRN Jesse Sans, MD   10 mg at 05/23/21 1755   propranolol (INDERAL) tablet 20 mg  20 mg Oral BID Jesse Sans, MD   20 mg at 05/28/21 2751   risperiDONE (RISPERDAL) tablet 6 mg  6 mg Oral BID Clapacs, Jackquline Denmark, MD   6 mg at 05/28/21 0816   senna-docusate (Senokot-S) tablet 2 tablet  2 tablet Oral QHS PRN Clapacs, Jackquline Denmark, MD       temazepam (RESTORIL) capsule 15 mg  15 mg Oral QHS PRN Clapacs, Jackquline Denmark, MD   15 mg at 05/27/21 2127   ziprasidone (GEODON) injection 20 mg  20 mg Intramuscular Q6H PRN Jesse Sans, MD        Lab Results: No results found for this or any previous visit (from the past 48 hour(s)).  Blood Alcohol level:  Lab Results  Component Value Date   [facility] <10 03/26/2019   ETH <11 10/11/2011    Metabolic Disorder Labs: Lab Results  Component Value Date   HGBA1C 5.0 05/05/2021   MPG 96.8 05/05/2021   No results found for: PROLACTIN Lab Results  Component Value Date   CHOL 202 (H) 05/05/2021   TRIG 83 05/05/2021   HDL 35 (L) 05/05/2021   CHOLHDL 5.8 05/05/2021   VLDL 17 05/05/2021   LDLCALC 150 (H) 05/05/2021    Physical Findings: AIMS: Facial and Oral Movements Muscles of Facial Expression: None, normal Lips and Perioral Area: None, normal Jaw: None, normal Tongue: None, normal,Extremity Movements Upper (arms, wrists, hands, fingers): None, normal Lower (legs, knees, ankles, toes): None, normal, Trunk Movements Neck, shoulders, hips: None, normal, Overall Severity Severity of abnormal  movements (highest score from questions above): None, normal Incapacitation due to abnormal movements: None, normal Patient's awareness of abnormal movements (rate only patient's report): No Awareness, Dental Status Current problems with teeth and/or dentures?: No Does patient usually wear dentures?: No  CIWA:    COWS:  Musculoskeletal: Strength & Muscle Tone: within normal limits Gait & Station: normal Patient leans: N/A  Psychiatric Specialty Exam:  Presentation  General Appearance: Appropriate for Environment; Fairly Groomed; Neat  Eye Contact:Fair  Speech:Pressured  Speech Volume:Normal  Handedness:Right   Mood and Affect  Mood:Anxious  Affect:Appropriate; Congruent   Thought Process  Thought Processes:Coherent  Descriptions of Associations:Intact  Orientation:Full (Time, Place and Person)  Thought Content:paranoid, VH History of Schizophrenia/Schizoaffective disorder:No  Duration of Psychotic Symptoms:Less than six months  Hallucinations:AVH Ideas of Reference:None  Suicidal Thoughts:denies Homicidal Thoughts:denies  Sensorium  Memory:Immediate Good; Recent Good; Remote Poor; Remote Good  Judgment:Fair  Insight:Fair   Executive Functions  Concentration:Fair  Attention Span:Fair  Recall:Fair  Fund of Knowledge:Fair  Language:Fair   Psychomotor Activity  Psychomotor Activity:No data recorded  Assets  Assets:Communication Skills; Desire for Improvement; Financial Resources/Insurance; Housing; Social Support   Sleep  Sleep:No data recorded   Physical Exam: Physical Exam Vitals and nursing note reviewed.  Constitutional:      Appearance: Normal appearance.  HENT:     Head: Normocephalic and atraumatic.     Mouth/Throat:     Pharynx: Oropharynx is clear.  Eyes:     Pupils: Pupils are equal, round, and reactive to light.  Cardiovascular:     Rate and Rhythm: Normal rate and regular rhythm.  Pulmonary:     Effort:  Pulmonary effort is normal.  Abdominal:     General: Abdomen is flat.  Musculoskeletal:        General: Normal range of motion.  Skin:    General: Skin is warm and dry.  Neurological:     General: No focal deficit present.     Mental Status: He is alert. Mental status is at baseline.  Psychiatric:        Attention and Perception: Attention normal.        Mood and Affect: Affect is blunt.        Speech: Speech is delayed.        Behavior: Behavior is slowed.        Cognition and Memory: Memory is impaired.   Review of Systems  Constitutional: Negative.   HENT: Negative.    Eyes: Negative.   Respiratory: Negative.    Cardiovascular: Negative.   Gastrointestinal: Negative.   Musculoskeletal: Negative.   Skin: Negative.   Neurological: Negative.   Psychiatric/Behavioral: Negative.    Blood pressure 103/76, pulse (!) 108, temperature 97.9 F (36.6 C), temperature source Oral, resp. rate 18, height 6\' 1"  (1.854 m), weight 67.6 kg, SpO2 99 %. Body mass index is 19.66 kg/m.   Treatment Plan Summary: Plan no change to psychiatric medicine.  Gradual improvement certainly has happened.  High doses already in place of medicine.  As far as the constipation we will discontinue the standing medicines for it.  Patient educated that it may come back at which point the Colace would be a good first choice.  Encourage group attendance and coming out of his room.  May be looking at discharge still within a few days Pt still psychotic. Plan- Cont risperdal for psychosis, QTc-410 Lithium for mood. Lithium level-0.6  , MD 05/28/2021, 1:42 PM Patient ID: [id number], male   DOB: 07-19-1980, 40 y.o.   MRN: [id number]

## 2021-05-28 NOTE — Group Note (Signed)
LCSW Group Therapy Note  Group Date: 05/28/2021 Start Time: 1300 End Time: 1350   Type of Therapy and Topic:  Group Therapy - Healthy vs Unhealthy Coping Skills  Participation Level:  Did Not Attend   Description of Group The focus of this group was to determine what unhealthy coping techniques typically are used by group members and what healthy coping techniques would be helpful in coping with various problems. Patients were guided in becoming aware of the differences between healthy and unhealthy coping techniques. Patients were asked to identify 2-3 healthy coping skills they would like to learn to use more effectively.  Therapeutic Goals Patients learned that coping is what human beings do all day long to deal with various situations in their lives Patients defined and discussed healthy vs unhealthy coping techniques Patients identified their preferred coping techniques and identified whether these were healthy or unhealthy Patients determined 2-3 healthy coping skills they would like to become more familiar with and use more often. Patients provided support and ideas to each other   Summary of Patient Progress: Patient did not attend group despite encouraged participation.   Therapeutic Modalities Cognitive Behavioral Therapy Motivational Interviewing  Nicholas Nielsen, Nicholas Nielsen 05/28/2021  4:06 PM

## 2021-05-28 NOTE — Progress Notes (Signed)
Patient compliant with medication, denies SI/AH/VH, Patient reported he thinks he saw a lady and a child by his window last night. Patient verbally contracted for safety.   Support and encouragement provided. Prn  Milk of Magnesia 30 ml given at 1259 and reported effective.

## 2021-05-29 DIAGNOSIS — F312 Bipolar disorder, current episode manic severe with psychotic features: Secondary | ICD-10-CM | POA: Diagnosis not present

## 2021-05-29 NOTE — Plan of Care (Signed)
  Problem: Education: Goal: Knowledge of [facility] Education information/materials will improve Outcome: Progressing Goal: Emotional status will improve Outcome: Progressing Goal: Mental status will improve Outcome: Progressing Goal: Verbalization of understanding the information provided will improve Outcome: Progressing   Problem: Activity: Goal: Interest or engagement in activities will improve Outcome: Progressing Goal: Sleeping patterns will improve Outcome: Progressing   Problem: Coping: Goal: Ability to verbalize frustrations and anger appropriately will improve Outcome: Progressing Goal: Ability to demonstrate self-control will improve Outcome: Progressing   Problem: Health Behavior/Discharge Planning: Goal: Identification of resources available to assist in meeting health care needs will improve Outcome: Progressing Goal: Compliance with treatment plan for underlying cause of condition will improve Outcome: Progressing   Problem: Physical Regulation: Goal: Ability to maintain clinical measurements within normal limits will improve Outcome: Progressing   Problem: Safety: Goal: Periods of time without injury will increase Outcome: Progressing   Problem: Education: Goal: Ability to state activities that reduce stress will improve Outcome: Progressing   Problem: Coping: Goal: Ability to identify and develop effective coping behavior will improve Outcome: Progressing   Problem: Self-Concept: Goal: Ability to identify factors that promote anxiety will improve Outcome: Progressing Goal: Level of anxiety will decrease Outcome: Progressing Goal: Ability to modify response to factors that promote anxiety will improve Outcome: Progressing   Problem: Activity: Goal: Will verbalize the importance of balancing activity with adequate rest periods Outcome: Progressing   Problem: Education: Goal: Will be free of psychotic symptoms Outcome:  Progressing Goal: Knowledge of the prescribed therapeutic regimen will improve Outcome: Progressing   Problem: Coping: Goal: Coping ability will improve Outcome: Progressing Goal: Will verbalize feelings Outcome: Progressing   Problem: Health Behavior/Discharge Planning: Goal: Compliance with prescribed medication regimen will improve Outcome: Progressing   Problem: Nutritional: Goal: Ability to achieve adequate nutritional intake will improve Outcome: Progressing   Problem: Role Relationship: Goal: Ability to communicate needs accurately will improve Outcome: Progressing Goal: Ability to interact with others will improve Outcome: Progressing   Problem: Safety: Goal: Ability to redirect hostility and anger into socially appropriate behaviors will improve Outcome: Progressing Goal: Ability to remain free from injury will improve Outcome: Progressing   Problem: Self-Care: Goal: Ability to participate in self-care as condition permits will improve Outcome: Progressing   Problem: Self-Concept: Goal: Will verbalize positive feelings about self Outcome: Progressing

## 2021-05-29 NOTE — Progress Notes (Signed)
[facility] MD Progress Note  05/29/2021 1:22 PM Nicholas Nielsen  MRN:  [id number] Subjective: Follow-up for this patient with bipolar or schizoaffective disorder in the hospital with psychosis.    I'm doing fine"  Per nursing- Patient says he still thinks he sees a woman and a child outside of his window from time to time but has not complained of hallucinations this shift. Denies SI. Pleasant and cooperative  Pt seen in dayroom today, pt denies any complaints, reports AVH is better, less guarded, paranoid, denies SI/HI. Blunted affect.    Principal Problem: Bipolar affective disorder, currently manic, severe, with psychotic features (HCC) Diagnosis: Principal Problem:   Bipolar affective disorder, currently manic, severe, with psychotic features (HCC) Active Problems:   Cannabis use disorder, moderate, dependence (HCC)  Total Time spent with patient: 30 minutes  Past Psychiatric History: Past history of psychotic disorder diagnosis bipolar  Past Medical History:  Past Medical History:  Diagnosis Date   Back pain    Bipolar 1 disorder (HCC)    Hepatitis C    Kidney stones    Schizo affective schizophrenia (HCC)    Testicular cyst    History reviewed. No pertinent surgical history. Family History:  Family History  Problem Relation Age of Onset   Stroke Father    Hypertension Father    Family Psychiatric  History: See previous Social History:  Social History   Substance and Sexual Activity  Alcohol Use Not Currently     Social History   Substance and Sexual Activity  Drug Use Yes   Frequency: 7.0 times per week   Types: Marijuana   Comment: Daily use; last use is 03/26/2019    Social History   Socioeconomic History   Marital status: Single    Spouse name: Not on file   Number of children: Not on file   Years of education: Not on file   Highest education level: Not on file  Occupational History   Occupation: Unemployed  Tobacco Use   Smoking status: Every Day     Packs/day: 1.00    Years: 15.00    Pack years: 15.00    Types: Cigarettes   Smokeless tobacco: Never  Vaping Use   Vaping Use: Never used  Substance and Sexual Activity   Alcohol use: Not Currently   Drug use: Yes    Frequency: 7.0 times per week    Types: Marijuana    Comment: Daily use; last use is 03/26/2019   Sexual activity: Never    Birth control/protection: Condom  Other Topics Concern   Not on file  Social History Narrative   Pt stated that he is currently living with his brother.  He lives in [place] and is unemployed.  Pt stated that he recently arranged an appointment with [facility].   Social Determinants of Health   Financial Resource Strain: Not on file  Food Insecurity: Not on file  Transportation Needs: Not on file  Physical Activity: Not on file  Stress: Not on file  Social Connections: Not on file   Additional Social History:                         Sleep: Fair  Appetite:  Fair  Current Medications: Current Facility-Administered Medications  Medication Dose Route Frequency Provider Last Rate Last Admin   acetaminophen (TYLENOL) tablet 650 mg  650 mg Oral Q6H PRN Clapacs, Jackquline Denmark, MD   650 mg at 05/26/21 0818   alum &  mag hydroxide-simeth (MAALOX/MYLANTA) 200-200-20 MG/5ML suspension 30 mL  30 mL Oral Q4H PRN Clapacs, John T, MD       amLODipine (NORVASC) tablet 5 mg  5 mg Oral Daily Les Pou M, MD   5 mg at 05/29/21 0807   benztropine (COGENTIN) tablet 1 mg  1 mg Oral BID Malik, Kashif   1 mg at 05/29/21 9038   feeding supplement (ENSURE ENLIVE / ENSURE PLUS) liquid 237 mL  237 mL Oral TID BM Jesse Sans, MD   237 mL at 05/29/21 1003   hydrALAZINE (APRESOLINE) tablet 10 mg  10 mg Oral Q6H PRN Jesse Sans, MD   10 mg at 05/08/21 3338   hydrOXYzine (ATARAX/VISTARIL) tablet 50 mg  50 mg Oral Q6H PRN Clapacs, Jackquline Denmark, MD   50 mg at 05/29/21 0806   lithium carbonate (LITHOBID) CR tablet 600 mg  600 mg Oral Q12H Clapacs, John T, MD    600 mg at 05/29/21 3291   magnesium hydroxide (MILK OF MAGNESIA) suspension 30 mL  30 mL Oral Daily PRN Clapacs, John T, MD   30 mL at 05/28/21 1259   nicotine (NICODERM CQ - dosed in mg/24 hours) patch 14 mg  14 mg Transdermal Daily Clapacs, Jackquline Denmark, MD   14 mg at 05/29/21 0806   OLANZapine (ZYPREXA) tablet 10 mg  10 mg Oral Q6H PRN Jesse Sans, MD   10 mg at 05/23/21 1755   propranolol (INDERAL) tablet 20 mg  20 mg Oral BID Jesse Sans, MD   20 mg at 05/29/21 9166   risperiDONE (RISPERDAL) tablet 6 mg  6 mg Oral BID Clapacs, Jackquline Denmark, MD   6 mg at 05/29/21 0600   senna-docusate (Senokot-S) tablet 2 tablet  2 tablet Oral QHS PRN Clapacs, Jackquline Denmark, MD       temazepam (RESTORIL) capsule 15 mg  15 mg Oral QHS PRN Clapacs, Jackquline Denmark, MD   15 mg at 05/27/21 2127   ziprasidone (GEODON) injection 20 mg  20 mg Intramuscular Q6H PRN Jesse Sans, MD        Lab Results: No results found for this or any previous visit (from the past 48 hour(s)).  Blood Alcohol level:  Lab Results  Component Value Date   [facility] <10 03/26/2019   ETH <11 10/11/2011    Metabolic Disorder Labs: Lab Results  Component Value Date   HGBA1C 5.0 05/05/2021   MPG 96.8 05/05/2021   No results found for: PROLACTIN Lab Results  Component Value Date   CHOL 202 (H) 05/05/2021   TRIG 83 05/05/2021   HDL 35 (L) 05/05/2021   CHOLHDL 5.8 05/05/2021   VLDL 17 05/05/2021   LDLCALC 150 (H) 05/05/2021    Physical Findings: AIMS: Facial and Oral Movements Muscles of Facial Expression: None, normal Lips and Perioral Area: None, normal Jaw: None, normal Tongue: None, normal,Extremity Movements Upper (arms, wrists, hands, fingers): None, normal Lower (legs, knees, ankles, toes): None, normal, Trunk Movements Neck, shoulders, hips: None, normal, Overall Severity Severity of abnormal movements (highest score from questions above): None, normal Incapacitation due to abnormal movements: None, normal Patient's awareness of  abnormal movements (rate only patient's report): No Awareness, Dental Status Current problems with teeth and/or dentures?: No Does patient usually wear dentures?: No  CIWA:    COWS:     Musculoskeletal: Strength & Muscle Tone: within normal limits Gait & Station: normal Patient leans: N/A  Psychiatric Specialty Exam:  Presentation  General Appearance:  Appropriate for Environment; Fairly Groomed; Neat  Eye Contact:Fair  Speech:Pressured  Speech Volume:Normal  Handedness:Right   Mood and Affect  Mood:Anxious  Affect:restricted  Thought Process  Thought Processes:Coherent  Descriptions of Associations:Intact  Orientation:Full (Time, Place and Person)  Thought Content:paranoid, AVH History of Schizophrenia/Schizoaffective disorder:No  Duration of Psychotic Symptoms:Less than six months  Hallucinations:AVH better Ideas of Reference:None  Suicidal Thoughts:denies Homicidal Thoughts:denies  Sensorium  Memory:Immediate Good; Recent Good; Remote Poor; Remote Good  Judgment:Fair  Insight:Fair   Executive Functions  Concentration:Fair  Attention Span:Fair  Recall:Fair  Fund of Knowledge:Fair  Language:Fair   Psychomotor Activity  Psychomotor Activity:No data recorded  Assets  Assets:Communication Skills; Desire for Improvement; Financial Resources/Insurance; Housing; Social Support   Sleep  Sleep:No data recorded   Physical Exam: Physical Exam Vitals and nursing note reviewed.  Constitutional:      Appearance: Normal appearance.  HENT:     Head: Normocephalic and atraumatic.     Mouth/Throat:     Pharynx: Oropharynx is clear.  Eyes:     Pupils: Pupils are equal, round, and reactive to light.  Cardiovascular:     Rate and Rhythm: Normal rate and regular rhythm.  Pulmonary:     Effort: Pulmonary effort is normal.  Abdominal:     General: Abdomen is flat.  Musculoskeletal:        General: Normal range of motion.  Skin:    General:  Skin is warm and dry.  Neurological:     General: No focal deficit present.     Mental Status: He is alert. Mental status is at baseline.  Psychiatric:        Attention and Perception: Attention normal.        Mood and Affect: Affect is blunt.        Speech: Speech is delayed.        Behavior: Behavior is slowed.        Cognition and Memory: Memory is impaired.   Review of Systems  Constitutional: Negative.   HENT: Negative.    Eyes: Negative.   Respiratory: Negative.    Cardiovascular: Negative.   Gastrointestinal: Negative.   Musculoskeletal: Negative.   Skin: Negative.   Neurological: Negative.   Psychiatric/Behavioral: Negative.    Blood pressure (!) 128/92, pulse 96, temperature 97.9 F (36.6 C), temperature source Oral, resp. rate 18, height 6\' 1"  (1.854 m), weight 67.6 kg, SpO2 99 %. Body mass index is 19.66 kg/m.   Treatment Plan Summary:  Pt still psychotic, less isolative today. Plan- Cont risperdal for psychosis, QTc-410 Lithium for mood. Lithium level-0.6 Encourage group/milieu tx.   , MD 05/29/2021, 1:22 PM Patient ID: 05/31/2021, male   DOB: 09-Mar-1981, 40 y.o.   MRN: [id number] Patient ID: JAVELLE DONIGAN, male   DOB: 1980-12-29, 40 y.o.   MRN: [id number]

## 2021-05-29 NOTE — Progress Notes (Signed)
Patient says he still thinks he sees a woman and a child outside of his window from time to time but has not complained of hallucinations this shift. Denies SI. Pleasant and cooperative

## 2021-05-29 NOTE — Plan of Care (Signed)
Cooperative and pleasant on approach. Pt got out of bed, ate breakfast and received AM medications. Denied SI/HI/AVH. Discussed with Clinical research associate about his current housing issues and shared that he has no family support. Was encouraged to discuss with case management. Emotional support provided. Safety precautions maintained.

## 2021-05-29 NOTE — Group Note (Signed)
LCSW Group Therapy Note  Group Date: 05/29/2021 Start Time: 1300 End Time: 1400   Type of Therapy and Topic:  Group Therapy - How To Cope with Nervousness about Discharge   Participation Level:  Active   Description of Group This process group involved identification of patients' feelings about discharge. Some of them are scheduled to be discharged soon, while others are new admissions, but each of them was asked to share thoughts and feelings surrounding discharge from the hospital. One common theme was that they are excited at the prospect of going home, while another was that many of them are apprehensive about sharing why they were hospitalized. Patients were given the opportunity to discuss these feelings with their peers in preparation for discharge.  Therapeutic Goals  Patient will identify their overall feelings about pending discharge. Patient will think about how they might proactively address issues that they believe will once again arise once they get home (i.e. with parents). Patients will participate in discussion about having hope for change.   Summary of Patient Progress: Patient was present for the entirety of the group session. Patient was an active listener and participated in the topic of discussion. Patient stated that he is feeling stable on his medications and is feeling a lot better than he has felt during prior hospitalizations. Patient shared how he has a pattern of discontinuing his medication after he begins feeling better. Patient stated that he plans to "stay clean" after he is discharged. Patient was observed writing on the supplemental goal exploration worksheet during group.    Therapeutic Modalities Cognitive Behavioral Therapy   Norberto Sorenson, Theresia Majors 05/29/2021  2:16 PM

## 2021-05-29 NOTE — BH IP Treatment Plan (Signed)
Interdisciplinary Treatment and Diagnostic Plan Update  05/29/2021 Time of Session: 9:00AM Nicholas Nielsen MRN: [id number]  Principal Diagnosis: Bipolar affective disorder, currently manic, severe, with psychotic features (HCC)  Secondary Diagnoses: Principal Problem:   Bipolar affective disorder, currently manic, severe, with psychotic features (HCC) Active Problems:   Cannabis use disorder, moderate, dependence (HCC)   Current Medications:  Current Facility-Administered Medications  Medication Dose Route Frequency Provider Last Rate Last Admin   acetaminophen (TYLENOL) tablet 650 mg  650 mg Oral Q6H PRN Clapacs, John T, MD   650 mg at 05/26/21 0818   alum & mag hydroxide-simeth (MAALOX/MYLANTA) 200-200-20 MG/5ML suspension 30 mL  30 mL Oral Q4H PRN Clapacs, John T, MD       amLODipine (NORVASC) tablet 5 mg  5 mg Oral Daily Les Pou M, MD   5 mg at 05/29/21 0807   benztropine (COGENTIN) tablet 1 mg  1 mg Oral BID Malik, Kashif   1 mg at 05/29/21 0807   feeding supplement (ENSURE ENLIVE / ENSURE PLUS) liquid 237 mL  237 mL Oral TID BM Jesse Sans, MD   237 mL at 05/28/21 2052   hydrALAZINE (APRESOLINE) tablet 10 mg  10 mg Oral Q6H PRN Jesse Sans, MD   10 mg at 05/08/21 4098   hydrOXYzine (ATARAX/VISTARIL) tablet 50 mg  50 mg Oral Q6H PRN Clapacs, Jackquline Denmark, MD   50 mg at 05/29/21 0806   lithium carbonate (LITHOBID) CR tablet 600 mg  600 mg Oral Q12H Clapacs, John T, MD   600 mg at 05/29/21 0807   magnesium hydroxide (MILK OF MAGNESIA) suspension 30 mL  30 mL Oral Daily PRN Clapacs, John T, MD   30 mL at 05/28/21 1259   nicotine (NICODERM CQ - dosed in mg/24 hours) patch 14 mg  14 mg Transdermal Daily Clapacs, John T, MD   14 mg at 05/29/21 0806   OLANZapine (ZYPREXA) tablet 10 mg  10 mg Oral Q6H PRN Jesse Sans, MD   10 mg at 05/23/21 1755   propranolol (INDERAL) tablet 20 mg  20 mg Oral BID Jesse Sans, MD   20 mg at 05/29/21 1191   risperiDONE (RISPERDAL)  tablet 6 mg  6 mg Oral BID Clapacs, John T, MD   6 mg at 05/29/21 4782   senna-docusate (Senokot-S) tablet 2 tablet  2 tablet Oral QHS PRN Clapacs, Jackquline Denmark, MD       temazepam (RESTORIL) capsule 15 mg  15 mg Oral QHS PRN Clapacs, John T, MD   15 mg at 05/27/21 2127   ziprasidone (GEODON) injection 20 mg  20 mg Intramuscular Q6H PRN Jesse Sans, MD       PTA Medications: Medications Prior to Admission  Medication Sig Dispense Refill Last Dose   citalopram (CELEXA) 20 MG tablet Take 20 mg by mouth every morning.      HYDROcodone-acetaminophen (NORCO/VICODIN) 5-325 MG tablet Take 1 tablet by mouth every 6 (six) hours as needed for severe pain. (Patient not taking: No sig reported) 10 tablet 0    hydrOXYzine (ATARAX/VISTARIL) 25 MG tablet Take 1 tablet (25 mg total) by mouth every 8 (eight) hours as needed for anxiety. (Patient not taking: Reported on 03/17/2019) 15 tablet 0    hydrOXYzine (ATARAX/VISTARIL) 25 MG tablet Take 1 tablet (25 mg total) by mouth every 6 (six) hours as needed for anxiety. (Patient not taking: No sig reported) 30 tablet 2    ondansetron (ZOFRAN ODT) 4 MG  disintegrating tablet Take 1 tablet (4 mg total) by mouth every 8 (eight) hours as needed for nausea or vomiting. (Patient not taking: Reported on 03/26/2019) 20 tablet 0    ondansetron (ZOFRAN) 4 MG tablet Take 1 tablet (4 mg total) by mouth every 8 (eight) hours as needed for nausea or vomiting. (Patient not taking: Reported on 05/03/2021) 15 tablet 0    sertraline (ZOLOFT) 50 MG tablet Take 1 tablet (50 mg total) by mouth daily. (Patient not taking: Reported on 05/03/2021) 30 tablet 1    traZODone (DESYREL) 50 MG tablet Take 50 mg by mouth at bedtime.       Patient Stressors:    Patient Strengths:    Treatment Modalities: Medication Management, Group therapy, Case management,  1 to 1 session with clinician, Psychoeducation, Recreational therapy.   Physician Treatment Plan for Primary Diagnosis: Bipolar affective  disorder, currently manic, severe, with psychotic features (HCC) Long Term Goal(s): Improvement in symptoms so as ready for discharge   Short Term Goals: Ability to identify changes in lifestyle to reduce recurrence of condition will improve Ability to verbalize feelings will improve Ability to disclose and discuss suicidal ideas Ability to demonstrate self-control will improve Ability to identify and develop effective coping behaviors will improve Ability to maintain clinical measurements within normal limits will improve Compliance with prescribed medications will improve Ability to identify triggers associated with substance abuse/mental health issues will improve  Medication Management: Evaluate patient's response, side effects, and tolerance of medication regimen.  Therapeutic Interventions: 1 to 1 sessions, Unit Group sessions and Medication administration.  Evaluation of Outcomes: Progressing  Physician Treatment Plan for Secondary Diagnosis: Principal Problem:   Bipolar affective disorder, currently manic, severe, with psychotic features (HCC) Active Problems:   Cannabis use disorder, moderate, dependence (HCC)  Long Term Goal(s): Improvement in symptoms so as ready for discharge   Short Term Goals: Ability to identify changes in lifestyle to reduce recurrence of condition will improve Ability to verbalize feelings will improve Ability to disclose and discuss suicidal ideas Ability to demonstrate self-control will improve Ability to identify and develop effective coping behaviors will improve Ability to maintain clinical measurements within normal limits will improve Compliance with prescribed medications will improve Ability to identify triggers associated with substance abuse/mental health issues will improve     Medication Management: Evaluate patient's response, side effects, and tolerance of medication regimen.  Therapeutic Interventions: 1 to 1 sessions, Unit Group  sessions and Medication administration.  Evaluation of Outcomes: Progressing   RN Treatment Plan for Primary Diagnosis: Bipolar affective disorder, currently manic, severe, with psychotic features (HCC) Long Term Goal(s): Knowledge of disease and therapeutic regimen to maintain health will improve  Short Term Goals: Ability to demonstrate self-control, Ability to participate in decision making will improve, Ability to verbalize feelings will improve, Ability to disclose and discuss suicidal ideas, Ability to identify and develop effective coping behaviors will improve, and Compliance with prescribed medications will improve  Medication Management: RN will administer medications as ordered by provider, will assess and evaluate patient's response and provide education to patient for prescribed medication. RN will report any adverse and/or side effects to prescribing provider.  Therapeutic Interventions: 1 on 1 counseling sessions, Psychoeducation, Medication administration, Evaluate responses to treatment, Monitor vital signs and CBGs as ordered, Perform/monitor CIWA, COWS, AIMS and Fall Risk screenings as ordered, Perform wound care treatments as ordered.  Evaluation of Outcomes: Progressing   LCSW Treatment Plan for Primary Diagnosis: Bipolar affective disorder, currently manic, severe, with  psychotic features [facility]) Long Term Goal(s): Safe transition to appropriate next level of care at discharge, Engage patient in therapeutic group addressing interpersonal concerns.  Short Term Goals: Engage patient in aftercare planning with referrals and resources, Increase social support, Increase ability to appropriately verbalize feelings, Increase emotional regulation, Facilitate acceptance of mental health diagnosis and concerns, and Increase skills for wellness and recovery  Therapeutic Interventions: Assess for all discharge needs, 1 to 1 time with Social worker, Explore available resources and  support systems, Assess for adequacy in community support network, Educate family and significant other(s) on suicide prevention, Complete Psychosocial Assessment, Interpersonal group therapy.  Evaluation of Outcomes: Progressing   Progress in Treatment: Attending groups: No. Participating in groups: No. Taking medication as prescribed: Yes. Toleration medication: Yes. Family/Significant other contact made: No, will contact:  SPE completed with patient. Patient declined collateral contact at this time. Patient understands diagnosis: Yes. Discussing patient identified problems/goals with staff: Yes. Medical problems stabilized or resolved: Yes. Denies suicidal/homicidal ideation: Yes. Issues/concerns per patient self-inventory: No. Other: none.  New problem(s) identified: No, Describe:  None.  New Short Term/Long Term Goal(s): detox, elimination of symptoms of psychosis, medication management for mood stabilization; elimination of SI thoughts; development of comprehensive mental wellness/sobriety plan. 05/09/21 Update: No changes at this time. 05/14/21 Update:  No changes at this time. 05/19/2021 Update:  No changes at this time. 05/24/2021 Update:  No changes at this time. 05/29/2021 Update: No changes at this time.   Patient Goals:  "Just my medicine." 05/09/21 Update: No changes at this time. 05/14/21 Update:  No changes at this time. 05/19/2021 Update:  No changes at this time. 05/24/2021 Update:  No changes at this time.05/29/2021 Update: No changes at this time.   Discharge Plan or Barriers: CSW will assist with development of an appropriate aftercare/discharge plan. 05/09/21 Update: No changes at this time.05/14/21 Update:  No changes at this time. 05/19/2021 Update:  Patient's medications have been increased to support continued improvement. 05/24/2021 Update:  Patient continues to do well on the unit.  He continues to struggle with attending group, however, evidence has been made that  ptient attempts to go, he walks down the hallway but is unable to enter the room.  He continues to report auditory hallucinations, however states that they are not command hallucinations and he is able to differentiate among them. 05/29/2021: No changes at this time.   Reason for Continuation of Hospitalization: Hallucinations Medication stabilization Suicidal ideation  Estimated Length of Stay: TBD   Scribe for Treatment Team: Marletta Lor 05/29/2021 9:02 AM

## 2021-05-30 ENCOUNTER — Other Ambulatory Visit: Payer: Self-pay

## 2021-05-30 DIAGNOSIS — F312 Bipolar disorder, current episode manic severe with psychotic features: Secondary | ICD-10-CM | POA: Diagnosis not present

## 2021-05-30 MED ORDER — PROPRANOLOL HCL 20 MG PO TABS
20.0000 mg | ORAL_TABLET | Freq: Two times a day (BID) | ORAL | 0 refills | Status: DC
Start: 2021-05-30 — End: 2021-09-12
  Filled 2021-05-30: qty 20, 10d supply, fill #0

## 2021-05-30 MED ORDER — RISPERIDONE 3 MG PO TABS
6.0000 mg | ORAL_TABLET | Freq: Two times a day (BID) | ORAL | 1 refills | Status: DC
Start: 2021-05-30 — End: 2021-09-12

## 2021-05-30 MED ORDER — LITHIUM CARBONATE ER 300 MG PO TBCR: 600.0000 mg | EXTENDED_RELEASE_TABLET | Freq: Two times a day (BID) | ORAL | 1 refills | Status: DC

## 2021-05-30 MED ORDER — HYDROXYZINE HCL 50 MG PO TABS: 50.0000 mg | ORAL_TABLET | Freq: Four times a day (QID) | ORAL | 1 refills | Status: DC | PRN

## 2021-05-30 MED ORDER — HYDROXYZINE HCL 50 MG PO TABS
50.0000 mg | ORAL_TABLET | Freq: Four times a day (QID) | ORAL | 0 refills | Status: DC | PRN
  Filled 2021-05-30: qty 20, 5d supply, fill #0

## 2021-05-30 MED ORDER — AMLODIPINE BESYLATE 5 MG PO TABS: 5.0000 mg | ORAL_TABLET | Freq: Every day | ORAL | 1 refills | Status: DC

## 2021-05-30 MED ORDER — BENZTROPINE MESYLATE 1 MG PO TABS: 1.0000 mg | ORAL_TABLET | Freq: Two times a day (BID) | ORAL | 1 refills | Status: DC

## 2021-05-30 MED ORDER — LITHIUM CARBONATE ER 300 MG PO TBCR
600.0000 mg | EXTENDED_RELEASE_TABLET | Freq: Two times a day (BID) | ORAL | 0 refills | Status: DC
  Filled 2021-05-30: qty 40, 10d supply, fill #0

## 2021-05-30 MED ORDER — PROPRANOLOL HCL 20 MG PO TABS: 20.0000 mg | ORAL_TABLET | Freq: Two times a day (BID) | ORAL | 1 refills | Status: DC

## 2021-05-30 MED ORDER — NICOTINE 14 MG/24HR TD PT24
14.0000 mg | MEDICATED_PATCH | Freq: Every day | TRANSDERMAL | 0 refills | Status: DC
  Filled 2021-05-30: qty 14, 14d supply, fill #0

## 2021-05-30 MED ORDER — AMLODIPINE BESYLATE 5 MG PO TABS
5.0000 mg | ORAL_TABLET | Freq: Every day | ORAL | 0 refills | Status: DC
  Filled 2021-05-30: qty 10, 10d supply, fill #0

## 2021-05-30 MED ORDER — BENZTROPINE MESYLATE 0.5 MG PO TABS
1.0000 mg | ORAL_TABLET | Freq: Two times a day (BID) | ORAL | 0 refills | Status: DC
  Filled 2021-05-30: qty 40, 10d supply, fill #0

## 2021-05-30 MED ORDER — TEMAZEPAM 15 MG PO CAPS: 15.0000 mg | ORAL_CAPSULE | Freq: Every evening | ORAL | 1 refills | Status: DC | PRN

## 2021-05-30 MED ORDER — NICOTINE 14 MG/24HR TD PT24: 14.0000 mg | MEDICATED_PATCH | Freq: Every day | TRANSDERMAL | 1 refills | Status: DC

## 2021-05-30 MED ORDER — RISPERIDONE 2 MG PO TABS
6.0000 mg | ORAL_TABLET | Freq: Two times a day (BID) | ORAL | 0 refills | Status: DC
  Filled 2021-05-30: qty 60, 10d supply, fill #0

## 2021-05-30 NOTE — Group Note (Signed)
[facility] LCSW Group Therapy Note    Group Date: 05/30/2021 Start Time: 1300 End Time: 1400  Type of Therapy and Topic:  Group Therapy:  Overcoming Obstacles  Participation Level:  [facility] PARTICIPATION LEVEL: Did Not Attend  Mood:  Description of Group:   In this group patients will be encouraged to explore what they see as obstacles to their own wellness and recovery. They will be guided to discuss their thoughts, feelings, and behaviors related to these obstacles. The group will process together ways to cope with barriers, with attention given to specific choices patients can make. Each patient will be challenged to identify changes they are motivated to make in order to overcome their obstacles. This group will be process-oriented, with patients participating in exploration of their own experiences as well as giving and receiving support and challenge from other group members.  Therapeutic Goals: 1. Patient will identify personal and current obstacles as they relate to admission. 2. Patient will identify barriers that currently interfere with their wellness or overcoming obstacles.  3. Patient will identify feelings, thought process and behaviors related to these barriers. 4. Patient will identify two changes they are willing to make to overcome these obstacles:    Summary of Patient Progress   X   Therapeutic Modalities:   Cognitive Behavioral Therapy Solution Focused Therapy Motivational Interviewing Relapse Prevention Therapy   Annalysia Willenbring J Raniah Karan, LCSW 

## 2021-05-30 NOTE — Progress Notes (Signed)
  [facility] Adult Case Management Discharge Plan :  Will you be returning to the same living situation after discharge:  Yes,  pt plans to return home At discharge, do you have transportation home?: Yes,  CSW to arrange transportation. Do you have the ability to pay for your medications: No.  Release of information consent forms completed and in the chart;  Patient's signature needed at discharge.  Patient to Follow up at:  Follow-up Information     [facility] Follow up.   Why: You can come tomorrow, 05/31/21, between 8am and 12pm. Thanks!! Contact information: [address] [place] [address] [phone]                 Next level of care provider has access to [facility] Link:no  Safety Planning and Suicide Prevention discussed: Yes,  SPE completed with pt.     Has patient been referred to the Quitline?: Patient refused referral  Patient has been referred for addiction treatment: Pt. refused referral  Glenis Smoker, LCSW 05/30/2021, 11:49 AM

## 2021-05-30 NOTE — Progress Notes (Signed)
Patient discharged to his current residence. Medications and prescriptions provided. Patient verbalized understanding of discharge instructions. No sign of distress and pt denies SI/HI.

## 2021-05-30 NOTE — Progress Notes (Signed)
Patient is pleasant and cooperative. He is med compliant and received his QHS meds without incident.  He denies si/hi/avh/anxiety and depression at this encounter.  He has been isolative to his room, but did come out to get snack.  Encouraged him to seek nursing staff with any concerns.     Cleo Weyand-Nicholson, LPN

## 2021-05-30 NOTE — [facility] Suicide Risk Assessment (Signed)
[facility] Discharge Suicide Risk Assessment   Principal Problem: Bipolar affective disorder, currently manic, severe, with psychotic features [facility]) Discharge Diagnoses: Principal Problem:   Bipolar affective disorder, currently manic, severe, with psychotic features (HCC) Active Problems:   Cannabis use disorder, moderate, dependence (HCC)   Total Time spent with patient: 45 minutes  Musculoskeletal: Strength & Muscle Tone: within normal limits Gait & Station: normal Patient leans: N/A  Psychiatric Specialty Exam  Presentation  General Appearance: Appropriate for Environment; Fairly Groomed; Neat  Eye Contact:Fair  Speech:Pressured  Speech Volume:Normal  Handedness:Right   Mood and Affect  Mood:Anxious  Duration of Depression Symptoms: No data recorded Affect:Appropriate; Congruent   Thought Process  Thought Processes:Coherent  Descriptions of Associations:Intact  Orientation:Full (Time, Place and Person)  Thought Content:Logical; Abstract Reasoning  History of Schizophrenia/Schizoaffective disorder:No  Duration of Psychotic Symptoms:Less than six months  Hallucinations:No data recorded Ideas of Reference:None  Suicidal Thoughts:No data recorded Homicidal Thoughts:No data recorded  Sensorium  Memory:Immediate Good; Recent Good; Remote Poor; Remote Good  Judgment:Fair  Insight:Fair   Executive Functions  Concentration:Fair  Attention Span:Fair  Recall:Fair  Fund of Knowledge:Fair  Language:Fair   Psychomotor Activity  Psychomotor Activity:No data recorded  Assets  Assets:Communication Skills; Desire for Improvement; Financial Resources/Insurance; Housing; Social Support   Sleep  Sleep:No data recorded  Physical Exam: Physical Exam Vitals and nursing note reviewed.  Constitutional:      Appearance: Normal appearance.  HENT:     Head: Normocephalic and atraumatic.     Mouth/Throat:     Pharynx: Oropharynx is clear.  Eyes:      Pupils: Pupils are equal, round, and reactive to light.  Cardiovascular:     Rate and Rhythm: Normal rate and regular rhythm.  Pulmonary:     Effort: Pulmonary effort is normal.     Breath sounds: Normal breath sounds.  Abdominal:     General: Abdomen is flat.     Palpations: Abdomen is soft.  Musculoskeletal:        General: Normal range of motion.  Skin:    General: Skin is warm and dry.  Neurological:     General: No focal deficit present.     Mental Status: He is alert. Mental status is at baseline.  Psychiatric:        Attention and Perception: Attention normal.        Mood and Affect: Mood normal. Affect is blunt.        Speech: Speech is delayed.        Behavior: Behavior is cooperative.        Thought Content: Thought content normal. Thought content is not paranoid. Thought content does not include suicidal ideation.        Cognition and Memory: Memory is impaired.        Judgment: Judgment normal.   Review of Systems  Constitutional: Negative.   HENT: Negative.    Eyes: Negative.   Respiratory: Negative.    Cardiovascular: Negative.   Gastrointestinal: Negative.   Musculoskeletal: Negative.   Skin: Negative.   Neurological: Negative.   Psychiatric/Behavioral: Negative.    Blood pressure 122/63, pulse (!) 109, temperature 97.9 F (36.6 C), temperature source Oral, resp. rate 17, height 6\' 1"  (1.854 m), weight 67.6 kg, SpO2 98 %. Body mass index is 19.66 kg/m.  Mental Status Per Nursing Assessment::   On Admission:  NA  Demographic Factors:  Male and Caucasian  Loss Factors: Financial problems/change in socioeconomic status  Historical Factors: NA  Risk Reduction  Factors:   Sense of responsibility to family, Living with another person, especially a relative, Positive social support, and Positive therapeutic relationship  Continued Clinical Symptoms:  Schizophrenia:   Paranoid or undifferentiated type  Cognitive Features That Contribute To Risk:  None     Suicide Risk:  Minimal: No identifiable suicidal ideation.  Patients presenting with no risk factors but with morbid ruminations; may be classified as minimal risk based on the severity of the depressive symptoms    Plan Of Care/Follow-up recommendations:  Patient will be discharged home and will have follow-up arrangements prepared for him in his home county.  10-day supply of medication provided at discharge as well as prescriptions.  Reviewed the importance of follow-up outpatient treatment and abstaining from marijuana and other intoxicating drugs.  Mordecai Rasmussen, MD 05/30/2021, 10:56 AM

## 2021-05-30 NOTE — Plan of Care (Signed)
  Problem: Education: Goal: Knowledge of [facility] Education information/materials will improve Outcome: Progressing Goal: Emotional status will improve Outcome: Progressing Goal: Mental status will improve Outcome: Progressing Goal: Verbalization of understanding the information provided will improve Outcome: Progressing   Problem: Activity: Goal: Interest or engagement in activities will improve Outcome: Progressing Goal: Sleeping patterns will improve Outcome: Progressing   Problem: Coping: Goal: Ability to verbalize frustrations and anger appropriately will improve Outcome: Progressing Goal: Ability to demonstrate self-control will improve Outcome: Progressing   

## 2021-05-30 NOTE — Plan of Care (Signed)
Pt to be discharged today.

## 2021-05-30 NOTE — Progress Notes (Deleted)
  [facility] Adult Case Management Discharge Plan :  Will you be returning to the same living situation after discharge:  Yes,  pt plans to return home. At discharge, do you have transportation home?: Yes,  CSW to arrange transportation. Do you have the ability to pay for your medications: No.  Release of information consent forms completed and in the chart;  Patient's signature needed at discharge.  Patient to Follow up at:  Follow-up Information     [facility] Follow up.   Why: You can come tomorrow between 8am and 12pm. Thanks!! Contact information: [address] [place] [address] [phone]                 Next level of care provider has access to [facility] Link:no  Safety Planning and Suicide Prevention discussed: Yes,  SPE completed with the pt.     Has patient been referred to the Quitline?: Patient refused referral  Patient has been referred for addiction treatment: N/A  Glenis Smoker, LCSW 05/30/2021, 11:48 AM

## 2021-05-30 NOTE — Discharge Summary (Signed)
Physician Discharge Summary Note  Patient:  Nicholas Nielsen is an 40 y.o., male MRN:  [id number] DOB:  February 11, 1981 Patient phone:  [phone] (home)  Patient address:   [address] [place] [address],  Total Time spent with patient: 45 minutes  Date of Admission:  05/03/2021 Date of Discharge: 05/30/2021  Reason for Admission: Patient was admitted with psychosis paranoia hallucinations agitated behavior very poor self-care  Principal Problem: Bipolar affective disorder, currently manic, severe, with psychotic features [facility]) Discharge Diagnoses: Principal Problem:   Bipolar affective disorder, currently manic, severe, with psychotic features (HCC) Active Problems:   Cannabis use disorder, moderate, dependence (HCC)   Past Psychiatric History: Past history of bipolar disorder or schizoaffective disorder with recurrent psychotic symptoms  Past Medical History:  Past Medical History:  Diagnosis Date   Back pain    Bipolar 1 disorder (HCC)    Hepatitis C    Kidney stones    Schizo affective schizophrenia (HCC)    Testicular cyst    History reviewed. No pertinent surgical history. Family History:  Family History  Problem Relation Age of Onset   Stroke Father    Hypertension Father    Family Psychiatric  History: See previous Social History:  Social History   Substance and Sexual Activity  Alcohol Use Not Currently     Social History   Substance and Sexual Activity  Drug Use Yes   Frequency: 7.0 times per week   Types: Marijuana   Comment: Daily use; last use is 03/26/2019    Social History   Socioeconomic History   Marital status: Single    Spouse name: Not on file   Number of children: Not on file   Years of education: Not on file   Highest education level: Not on file  Occupational History   Occupation: Unemployed  Tobacco Use   Smoking status: Every Day    Packs/day: 1.00    Years: 15.00    Pack years: 15.00    Types: Cigarettes    Smokeless tobacco: Never  Vaping Use   Vaping Use: Never used  Substance and Sexual Activity   Alcohol use: Not Currently   Drug use: Yes    Frequency: 7.0 times per week    Types: Marijuana    Comment: Daily use; last use is 03/26/2019   Sexual activity: Never    Birth control/protection: Condom  Other Topics Concern   Not on file  Social History Narrative   Pt stated that he is currently living with his brother.  He lives in [place] and is unemployed.  Pt stated that he recently arranged an appointment with [facility].   Social Determinants of Health   Financial Resource Strain: Not on file  Food Insecurity: Not on file  Transportation Needs: Not on file  Physical Activity: Not on file  Stress: Not on file  Social Connections: Not on file    Hospital Course: Extended hospitalization because of the length of time it took for him to really get better.  Very gradual improvement.  His agitation diminished early on with medication but he continued to experience hallucinations sometimes command hallucinations paranoia and psychotic symptoms that made him frightened.  He was consistently compliant with medicine by his report and as judged by lithium levels.  Only very gradually towards the end of his stay started to become more interactive with peers and staff.  Patient did not display violent behavior or suicide attempts or aggression.  Maintain 15-minute checks.  Tolerated medicines  well.  Being discharged now on antipsychotic and mood stabilizer.  Relatively high dose of Risperdal but he is tolerating it well with no EPS or signs of side effects.  Patient has been counseled about the importance of follow-up treatment and will be referred for outpatient treatment in his home county and will be provided with a supply of medicine and prescriptions at discharge.  Patient inquired at discharge whether there was any specific treatment that would help him to resist marijuana usage.  He was educated  that there was no specific medication but that I agreed that doing everything he could to avoid cannabis abuse would be a big help to keep him from going back into his paranoid state.  Physical Findings: AIMS: Facial and Oral Movements Muscles of Facial Expression: None, normal Lips and Perioral Area: None, normal Jaw: None, normal Tongue: None, normal,Extremity Movements Upper (arms, wrists, hands, fingers): None, normal Lower (legs, knees, ankles, toes): None, normal, Trunk Movements Neck, shoulders, hips: None, normal, Overall Severity Severity of abnormal movements (highest score from questions above): None, normal Incapacitation due to abnormal movements: None, normal Patient's awareness of abnormal movements (rate only patient's report): No Awareness, Dental Status Current problems with teeth and/or dentures?: No Does patient usually wear dentures?: No  CIWA:    COWS:     Musculoskeletal: Strength & Muscle Tone: within normal limits Gait & Station: normal Patient leans: N/A   Psychiatric Specialty Exam:  Presentation  General Appearance: Appropriate for Environment; Fairly Groomed; Neat  Eye Contact:Fair  Speech:Pressured  Speech Volume:Normal  Handedness:Right   Mood and Affect  Mood:Anxious  Affect:Appropriate; Congruent   Thought Process  Thought Processes:Coherent  Descriptions of Associations:Intact  Orientation:Full (Time, Place and Person)  Thought Content:Logical; Abstract Reasoning  History of Schizophrenia/Schizoaffective disorder:No  Duration of Psychotic Symptoms:Less than six months  Hallucinations:No data recorded Ideas of Reference:None  Suicidal Thoughts:No data recorded Homicidal Thoughts:No data recorded  Sensorium  Memory:Immediate Good; Recent Good; Remote Poor; Remote Good  Judgment:Fair  Insight:Fair   Executive Functions  Concentration:Fair  Attention Span:Fair  Recall:Fair  Fund of  Knowledge:Fair  Language:Fair   Psychomotor Activity  Psychomotor Activity:No data recorded  Assets  Assets:Communication Skills; Desire for Improvement; Financial Resources/Insurance; Housing; Social Support   Sleep  Sleep:No data recorded   Physical Exam: Physical Exam Vitals and nursing note reviewed.  Constitutional:      Appearance: Normal appearance.  HENT:     Head: Normocephalic and atraumatic.     Mouth/Throat:     Pharynx: Oropharynx is clear.  Eyes:     Pupils: Pupils are equal, round, and reactive to light.  Cardiovascular:     Rate and Rhythm: Normal rate and regular rhythm.  Pulmonary:     Effort: Pulmonary effort is normal.     Breath sounds: Normal breath sounds.  Abdominal:     General: Abdomen is flat.     Palpations: Abdomen is soft.  Musculoskeletal:        General: Normal range of motion.  Skin:    General: Skin is warm and dry.  Neurological:     General: No focal deficit present.     Mental Status: He is alert. Mental status is at baseline.  Psychiatric:        Attention and Perception: Attention normal.        Mood and Affect: Mood normal. Affect is blunt.        Speech: Speech is delayed.        Behavior:  Behavior is cooperative.        Thought Content: Thought content normal.        Cognition and Memory: Cognition normal.        Judgment: Judgment normal.   Review of Systems  Constitutional: Negative.   HENT: Negative.    Eyes: Negative.   Respiratory: Negative.    Cardiovascular: Negative.   Gastrointestinal: Negative.   Musculoskeletal: Negative.   Skin: Negative.   Neurological: Negative.   Psychiatric/Behavioral: Negative.    Blood pressure 122/63, pulse (!) 109, temperature 97.9 F (36.6 C), temperature source Oral, resp. rate 17, height 6\' 1"  (1.854 m), weight 67.6 kg, SpO2 98 %. Body mass index is 19.66 kg/m.   Social History   Tobacco Use  Smoking Status Every Day   Packs/day: 1.00   Years: 15.00   Pack years:  15.00   Types: Cigarettes  Smokeless Tobacco Never   Tobacco Cessation:  A prescription for an FDA-approved tobacco cessation medication provided at discharge   Blood Alcohol level:  Lab Results  Component Value Date   [facility] <10 03/26/2019   ETH <11 10/11/2011    Metabolic Disorder Labs:  Lab Results  Component Value Date   HGBA1C 5.0 05/05/2021   MPG 96.8 05/05/2021   No results found for: PROLACTIN Lab Results  Component Value Date   CHOL 202 (H) 05/05/2021   TRIG 83 05/05/2021   HDL 35 (L) 05/05/2021   CHOLHDL 5.8 05/05/2021   VLDL 17 05/05/2021   LDLCALC 150 (H) 05/05/2021    See Psychiatric Specialty Exam and Suicide Risk Assessment completed by Attending Physician prior to discharge.  Discharge destination:  Home  Is patient on multiple antipsychotic therapies at discharge:  No   Has Patient had three or more failed trials of antipsychotic monotherapy by history:  No  Recommended Plan for Multiple Antipsychotic Therapies: NA  Discharge Instructions     Diet - low sodium heart healthy   Complete by: As directed    Increase activity slowly   Complete by: As directed       Allergies as of 05/30/2021       Reactions   Quetiapine Fumarate Other (See Comments)   Wake up with "Locked jaw" with high doses, 300-400 mg   Latex Rash        Medication List     STOP taking these medications    citalopram 20 MG tablet Commonly known as: CELEXA   HYDROcodone-acetaminophen 5-325 MG tablet Commonly known as: NORCO/VICODIN   ondansetron 4 MG disintegrating tablet Commonly known as: Zofran ODT   ondansetron 4 MG tablet Commonly known as: ZOFRAN   sertraline 50 MG tablet Commonly known as: ZOLOFT   traZODone 50 MG tablet Commonly known as: DESYREL       TAKE these medications      Indication  amLODipine 5 MG tablet Commonly known as: NORVASC Take 1 tablet (5 mg total) by mouth daily. Start taking on: May 31, 2021  Indication: High  Blood Pressure Disorder   benztropine 1 MG tablet Commonly known as: COGENTIN Take 1 tablet (1 mg total) by mouth 2 (two) times daily.  Indication: Extrapyramidal Reaction caused by Medications   hydrOXYzine 50 MG tablet Commonly known as: ATARAX/VISTARIL Take 1 tablet (50 mg total) by mouth every 6 (six) hours as needed for anxiety. What changed:  medication strength how much to take when to take this Another medication with the same name was removed. Continue taking this medication, and follow  the directions you see here.  Indication: Feeling Anxious   lithium carbonate 300 MG CR tablet Commonly known as: LITHOBID Take 2 tablets (600 mg total) by mouth every 12 (twelve) hours.  Indication: Schizoaffective Disorder   nicotine 14 mg/24hr patch Commonly known as: NICODERM CQ - dosed in mg/24 hours Place 1 patch (14 mg total) onto the skin daily. Start taking on: May 31, 2021  Indication: Nicotine Addiction   propranolol 20 MG tablet Commonly known as: INDERAL Take 1 tablet (20 mg total) by mouth 2 (two) times daily.  Indication: High Blood Pressure Disorder, Rapid Heart Rate Disorder   risperiDONE 3 MG tablet Commonly known as: RISPERDAL Take 2 tablets (6 mg total) by mouth 2 (two) times daily.  Indication: Manic Phase of Manic-Depression, Schizophrenia   temazepam 15 MG capsule Commonly known as: RESTORIL Take 1 capsule (15 mg total) by mouth at bedtime as needed for sleep.  Indication: Trouble Sleeping         Follow-up recommendations: Activity as tolerated.  Regular diet.  Follow-up with [facility] in home county.  Continue current medicine.  Abstain from marijuana and other abusable drugs  Comments: Prescription medications provided as a supply plus prescriptions at discharge  Signed: Mordecai Rasmussen, MD 05/30/2021, 11:03 AM

## 2021-08-30 ENCOUNTER — Other Ambulatory Visit: Payer: Self-pay

## 2021-08-30 ENCOUNTER — Inpatient Hospital Stay
Admission: AD | Admit: 2021-08-30 | Discharge: 2021-09-12 | DRG: 885 | Disposition: A | Discharge status: Home / Self Care | Payer: 59 | Source: Intra-hospital | Attending: Psychiatry | Admitting: Psychiatry

## 2021-08-30 ENCOUNTER — Emergency Department
Admission: EM | Admit: 2021-08-30 | Discharge: 2021-08-30 | Disposition: A | Discharge status: Other Acute Inpatient Hospital | Payer: No Typology Code available for payment source | Attending: Emergency Medicine | Admitting: Emergency Medicine

## 2021-08-30 ENCOUNTER — Encounter: Payer: Self-pay | Admitting: Psychiatry

## 2021-08-30 DIAGNOSIS — F259 Schizoaffective disorder, unspecified: Secondary | ICD-10-CM | POA: Diagnosis not present

## 2021-08-30 DIAGNOSIS — Z046 Encounter for general psychiatric examination, requested by authority: Secondary | ICD-10-CM | POA: Diagnosis not present

## 2021-08-30 DIAGNOSIS — Y900 Blood alcohol level of less than 20 mg/100 ml: Secondary | ICD-10-CM | POA: Diagnosis not present

## 2021-08-30 DIAGNOSIS — Z9151 Personal history of suicidal behavior: Secondary | ICD-10-CM

## 2021-08-30 DIAGNOSIS — F29 Unspecified psychosis not due to a substance or known physiological condition: Secondary | ICD-10-CM | POA: Insufficient documentation

## 2021-08-30 DIAGNOSIS — Z87442 Personal history of urinary calculi: Secondary | ICD-10-CM

## 2021-08-30 DIAGNOSIS — Z20822 Contact with and (suspected) exposure to covid-19: Secondary | ICD-10-CM | POA: Diagnosis present

## 2021-08-30 DIAGNOSIS — Z888 Allergy status to other drugs, medicaments and biological substances status: Secondary | ICD-10-CM

## 2021-08-30 DIAGNOSIS — F17210 Nicotine dependence, cigarettes, uncomplicated: Secondary | ICD-10-CM | POA: Diagnosis present

## 2021-08-30 DIAGNOSIS — R443 Hallucinations, unspecified: Secondary | ICD-10-CM | POA: Insufficient documentation

## 2021-08-30 DIAGNOSIS — R45851 Suicidal ideations: Secondary | ICD-10-CM | POA: Diagnosis present

## 2021-08-30 DIAGNOSIS — F312 Bipolar disorder, current episode manic severe with psychotic features: Secondary | ICD-10-CM | POA: Diagnosis present

## 2021-08-30 DIAGNOSIS — G4700 Insomnia, unspecified: Secondary | ICD-10-CM | POA: Diagnosis present

## 2021-08-30 DIAGNOSIS — F22 Delusional disorders: Secondary | ICD-10-CM | POA: Diagnosis not present

## 2021-08-30 DIAGNOSIS — Z91040 Latex allergy status: Secondary | ICD-10-CM

## 2021-08-30 DIAGNOSIS — K5900 Constipation, unspecified: Secondary | ICD-10-CM | POA: Diagnosis not present

## 2021-08-30 DIAGNOSIS — F319 Bipolar disorder, unspecified: Secondary | ICD-10-CM | POA: Diagnosis present

## 2021-08-30 DIAGNOSIS — Z560 Unemployment, unspecified: Secondary | ICD-10-CM

## 2021-08-30 LAB — CBC
HCT: 44.9 % (ref 39.0–52.0)
Hemoglobin: 15 g/dL (ref 13.0–17.0)
MCH: 32.3 pg (ref 26.0–34.0)
MCHC: 33.4 g/dL (ref 30.0–36.0)
MCV: 96.6 fL (ref 80.0–100.0)
Platelets: 186 10*3/uL (ref 150–400)
RBC: 4.65 MIL/uL (ref 4.22–5.81)
RDW: 12.7 % (ref 11.5–15.5)
WBC: 7.8 10*3/uL (ref 4.0–10.5)
nRBC: 0 % (ref 0.0–0.2)

## 2021-08-30 LAB — COMPREHENSIVE METABOLIC PANEL
ALT: 24 U/L (ref 0–44)
AST: 26 U/L (ref 15–41)
Albumin: 4 g/dL (ref 3.5–5.0)
Alkaline Phosphatase: 84 U/L (ref 38–126)
Anion gap: 10 (ref 5–15)
BUN: 14 mg/dL (ref 6–20)
CO2: 25 mmol/L (ref 22–32)
Calcium: 9.3 mg/dL (ref 8.9–10.3)
Chloride: 105 mmol/L (ref 98–111)
Creatinine, Ser: 0.75 mg/dL (ref 0.61–1.24)
GFR, Estimated: >60 mL/min (ref >60)
Glucose, Bld: 140 mg/dL — ABNORMAL HIGH (ref 70–99)
Potassium: 3.7 mmol/L (ref 3.5–5.1)
Sodium: 140 mmol/L (ref 135–145)
Total Bilirubin: 0.4 mg/dL (ref 0.3–1.2)
Total Protein: 7.2 g/dL (ref 6.5–8.1)

## 2021-08-30 LAB — RESP PANEL BY RT-PCR (FLU A&B, COVID) ARPGX2
Influenza A by PCR: NEGATIVE
Influenza B by PCR: NEGATIVE
SARS Coronavirus 2 by RT PCR: NEGATIVE

## 2021-08-30 LAB — ETHANOL: Alcohol, Ethyl (B): <10 mg/dL (ref <10)

## 2021-08-30 LAB — URINE DRUG SCREEN, QUALITATIVE (ARMC ONLY)
Amphetamines, Ur Screen: NOT DETECTED
Barbiturates, Ur Screen: NOT DETECTED
Benzodiazepine, Ur Scrn: NOT DETECTED
Cannabinoid 50 Ng, Ur ~~LOC~~: NOT DETECTED
Cocaine Metabolite,Ur ~~LOC~~: NOT DETECTED
MDMA (Ecstasy)Ur Screen: NOT DETECTED
Methadone Scn, Ur: NOT DETECTED
Opiate, Ur Screen: NOT DETECTED
Phencyclidine (PCP) Ur S: NOT DETECTED
Tricyclic, Ur Screen: NOT DETECTED

## 2021-08-30 LAB — SALICYLATE LEVEL: Salicylate Lvl: <7 mg/dL — ABNORMAL LOW (ref 7.0–30.0)

## 2021-08-30 LAB — LITHIUM LEVEL: Lithium Lvl: 0.15 mmol/L — ABNORMAL LOW (ref 0.60–1.20)

## 2021-08-30 LAB — ACETAMINOPHEN LEVEL: Acetaminophen (Tylenol), Serum: <10 ug/mL — ABNORMAL LOW (ref 10–30)

## 2021-08-30 MED ORDER — NICOTINE 14 MG/24HR TD PT24
14.0000 mg | MEDICATED_PATCH | Freq: Every day | TRANSDERMAL | Status: DC
  Administered 2021-08-31 – 2021-09-12 (×13): 14 mg via TRANSDERMAL
  Filled 2021-08-30 (×13): qty 1

## 2021-08-30 MED ORDER — NICOTINE POLACRILEX 2 MG MT GUM
2.0000 mg | CHEWING_GUM | OROMUCOSAL | Status: DC | PRN
  Filled 2021-08-30: qty 1

## 2021-08-30 MED ORDER — AMLODIPINE BESYLATE 5 MG PO TABS
5.0000 mg | ORAL_TABLET | Freq: Two times a day (BID) | ORAL | Status: DC
  Administered 2021-08-30 – 2021-09-12 (×23): 5 mg via ORAL
  Filled 2021-08-30 (×25): qty 1

## 2021-08-30 MED ORDER — BENZTROPINE MESYLATE 1 MG PO TABS
1.0000 mg | ORAL_TABLET | Freq: Two times a day (BID) | ORAL | Status: DC
Start: 2021-08-30 — End: 2021-08-30

## 2021-08-30 MED ORDER — ACETAMINOPHEN 325 MG PO TABS
650.0000 mg | ORAL_TABLET | Freq: Once | ORAL | Status: AC
Start: 2021-08-30 — End: 2021-08-30
  Administered 2021-08-30: 650 mg via ORAL
  Filled 2021-08-30: qty 2

## 2021-08-30 MED ORDER — RISPERIDONE 3 MG PO TABS: 3.0000 mg | ORAL_TABLET | Freq: Every day | ORAL | Status: DC

## 2021-08-30 MED ORDER — NICOTINE 14 MG/24HR TD PT24
14.0000 mg | MEDICATED_PATCH | Freq: Every day | TRANSDERMAL | Status: DC
  Administered 2021-08-30: 14 mg via TRANSDERMAL
  Filled 2021-08-30: qty 1

## 2021-08-30 MED ORDER — ALUM & MAG HYDROXIDE-SIMETH 200-200-20 MG/5ML PO SUSP: 30.0000 mL | ORAL | Status: DC | PRN

## 2021-08-30 MED ORDER — AMLODIPINE BESYLATE 5 MG PO TABS
5.0000 mg | ORAL_TABLET | Freq: Two times a day (BID) | ORAL | Status: DC
Start: 2021-08-30 — End: 2021-08-30

## 2021-08-30 MED ORDER — RISPERIDONE 1 MG PO TABS
6.0000 mg | ORAL_TABLET | Freq: Every day | ORAL | Status: DC
  Administered 2021-08-30 – 2021-09-07 (×9): 6 mg via ORAL
  Filled 2021-08-30 (×9): qty 6

## 2021-08-30 MED ORDER — PROPRANOLOL HCL 10 MG PO TABS: 20.0000 mg | ORAL_TABLET | Freq: Two times a day (BID) | ORAL | Status: DC

## 2021-08-30 MED ORDER — NICOTINE POLACRILEX 2 MG MT GUM: 2.0000 mg | CHEWING_GUM | OROMUCOSAL | Status: DC | PRN

## 2021-08-30 MED ORDER — RISPERIDONE 3 MG PO TABS
6.0000 mg | ORAL_TABLET | Freq: Every day | ORAL | Status: DC
Start: 2021-08-30 — End: 2021-08-30

## 2021-08-30 MED ORDER — HYDROXYZINE HCL 50 MG PO TABS
50.0000 mg | ORAL_TABLET | Freq: Three times a day (TID) | ORAL | Status: DC | PRN
  Administered 2021-09-03 – 2021-09-12 (×6): 50 mg via ORAL
  Filled 2021-08-30 (×6): qty 1

## 2021-08-30 MED ORDER — HYDROXYZINE HCL 25 MG PO TABS
50.0000 mg | ORAL_TABLET | Freq: Three times a day (TID) | ORAL | Status: DC | PRN
  Administered 2021-08-30: 50 mg via ORAL
  Filled 2021-08-30: qty 2

## 2021-08-30 MED ORDER — PROPRANOLOL HCL 20 MG PO TABS
20.0000 mg | ORAL_TABLET | Freq: Two times a day (BID) | ORAL | Status: DC
  Administered 2021-08-30 – 2021-09-01 (×4): 20 mg via ORAL
  Filled 2021-08-30 (×4): qty 1

## 2021-08-30 MED ORDER — LITHIUM CARBONATE ER 300 MG PO TBCR: 600.0000 mg | EXTENDED_RELEASE_TABLET | Freq: Two times a day (BID) | ORAL | Status: DC

## 2021-08-30 MED ORDER — LITHIUM CARBONATE ER 300 MG PO TBCR
600.0000 mg | EXTENDED_RELEASE_TABLET | Freq: Two times a day (BID) | ORAL | Status: DC
  Administered 2021-08-30 – 2021-09-12 (×26): 600 mg via ORAL
  Filled 2021-08-30 (×26): qty 2

## 2021-08-30 MED ORDER — BENZTROPINE MESYLATE 1 MG PO TABS
1.0000 mg | ORAL_TABLET | Freq: Two times a day (BID) | ORAL | Status: DC
  Administered 2021-08-30 – 2021-09-10 (×23): 1 mg via ORAL
  Filled 2021-08-30 (×22): qty 1

## 2021-08-30 MED ORDER — TEMAZEPAM 15 MG PO CAPS: 15.0000 mg | ORAL_CAPSULE | Freq: Every evening | ORAL | Status: DC | PRN

## 2021-08-30 MED ORDER — MAGNESIUM HYDROXIDE 400 MG/5ML PO SUSP
30.0000 mL | Freq: Every day | ORAL | Status: DC | PRN
  Administered 2021-09-06: 30 mL via ORAL
  Filled 2021-08-30: qty 30

## 2021-08-30 MED ORDER — TEMAZEPAM 15 MG PO CAPS
15.0000 mg | ORAL_CAPSULE | Freq: Every evening | ORAL | Status: DC | PRN
  Administered 2021-09-02 – 2021-09-11 (×8): 15 mg via ORAL
  Filled 2021-08-30 (×8): qty 1

## 2021-08-30 MED ORDER — ACETAMINOPHEN 325 MG PO TABS
650.0000 mg | ORAL_TABLET | Freq: Four times a day (QID) | ORAL | Status: DC | PRN
  Administered 2021-09-01 – 2021-09-08 (×2): 650 mg via ORAL
  Filled 2021-08-30 (×2): qty 2

## 2021-08-30 NOTE — ED Notes (Signed)
Pt accepted on BHU @ [facility].  [facility] staff stated pt to be transferred after 1900, call [phone] to give report.

## 2021-08-30 NOTE — ED Triage Notes (Signed)
Pt here with [place] [employer] under IVC for paranoia and hallucinations. Pt chasing voices and responds to them. Pt was IVC'd back in Oct 2022. Pt states that he is hearing voices telling him "that are going to fight him" and "speak about suicide".

## 2021-08-30 NOTE — BH Assessment (Signed)
Patient is to be admitted to [facility] by Psychiatric Nurse Practitioner  Sharon Mt .  Attending Physician will be Dr.  Weber Cooks .   Patient has been assigned to room 320, by [place]   Intake Paper Work has been signed and placed on patient chart.  ER staff is aware of the admission: Apolonio Schneiders, ER Secretary   Dr. Archie Balboa, ER MD  Maudie Mercury, Patient's Nurse  Helene Kelp, Patient Access.   Pt can arrive anytime after 7:45 PM.

## 2021-08-30 NOTE — Plan of Care (Signed)
Patient new to the unit tonight, hasn't had time to progress  Problem: Education: Goal: Knowledge of [facility] Education information/materials will improve Outcome: Not Progressing Goal: Emotional status will improve Outcome: Not Progressing Goal: Mental status will improve Outcome: Not Progressing Goal: Verbalization of understanding the information provided will improve Outcome: Not Progressing   Problem: Safety: Goal: Periods of time without injury will increase Outcome: Not Progressing   Problem: Education: Goal: Will be free of psychotic symptoms Outcome: Not Progressing Goal: Knowledge of the prescribed therapeutic regimen will improve Outcome: Not Progressing   Problem: Safety: Goal: Ability to redirect hostility and anger into socially appropriate behaviors will improve Outcome: Not Progressing Goal: Ability to remain free from injury will improve Outcome: Not Progressing   

## 2021-08-30 NOTE — ED Notes (Signed)
TTS at bedside. 

## 2021-08-30 NOTE — ED Notes (Signed)
Meal tray provided.

## 2021-08-30 NOTE — Progress Notes (Signed)
Patient admitted from [facility] - ED, report received from Selena Batten, [place]. Pt calm and pleasant during assessment. Pt endorses A/H that are non-commanding. Pt denies V/H. Pt has delusions stating that he thinks he has a chip in his head. Pt given education, support, and encouragement to be active in his treatment plan. Pt skin assessment completed with [place], RN, no abnormalities found. No contraband found on patient or in belongings. Patient oriented to the unit and his room and given snack. Pt compliant with medication administration per MD orders. Pt being monitored Q 15 minutes for safety per unit protocol. Pt remains safe on the unit.

## 2021-08-30 NOTE — BH Assessment (Signed)
Comprehensive Clinical Assessment (CCA) Note  08/30/2021 Nicholas Nielsen [id number]  Nicholas ScoreMichael Nielsen 41 year old male who presents to [facility] ED involuntarily for treatment. Per triage note, Pt here with Nicholas Nielsen under IVC for paranoia and hallucinations. Pt chasing voices and responds to them. Pt was IVC'd back in Oct 2022. Pt states that he is hearing voices telling him "that are going to fight him" and "speak about suicide".   During TTS assessment pt presents alert and oriented x 4, anxious but cooperative, and mood-congruent with affect. The pt appears to be responding to internal stimuli. Pt is presenting with delusional thinking. Pt verified the information provided to triage RN.   Pt identifies his main complaint to be that he has a chip in his head and he can read peoples minds. Patient reports he has seizures that last several hours which feel like his heart is going to explode. Patient states this has been going on for the past several weeks and he does not know what to do causing him to be depressed. Patient was seen here [facility] in November 2022 with similar symptoms. Patient reports having auditory hallucinations that tell him to say things he does not mean. Patient reports making suicidal comments but has no intention of harming himself. Patient reports he discontinued some of his psych meds because he did not like the way they were making him feel. Patient denies using any illicit substances and alcohol. Patient reports having poor sleep habits where he has been up for the past 3 days. Patient states since consistently taking Lithium, his appetite has increased but prior to that he was hardly eating. Pt denies current SI/HI. Patient was tearful and visibly in distress. Pt is unable to contract for safety.    Per Sallye OberLouise, NP, pt is recommended for inpatient psychiatric admission.   Chief Complaint:  Chief Complaint  Patient presents with   Paranoid   Visit Diagnosis: Bipolar  affective disorder, currently manic, severe, with psychotic features    CCA Screening, Triage and Referral (STR)  Patient Reported Information How did you hear about us? -- Mudlogger(Law Enforcement)  Referral name: No data recorded Referral phone number: No data recorded  Whom do you see for routine medical problems? No data recorded Practice/Facility Name: No data recorded Practice/Facility Phone Number: No data recorded Name of Contact: No data recorded Contact Number: No data recorded Contact Fax Number: No data recorded Prescriber Name: No data recorded Prescriber Address (if known): No data recorded  What Is the Reason for Your Visit/Call Today? Patient was brought to the ED due to hallucinations and paranoia.  How Long Has This Been Causing You Problems? 1 wk - 1 month  What Do You Feel Would Help You the Most Today? Treatment for Depression or other mood problem; Medication(s)   Have You Recently Been in Any Inpatient Treatment (Hospital/Detox/Crisis Center/28-Day Program)? No data recorded Name/Location of Program/Hospital:No data recorded How Long Were You There? No data recorded When Were You Discharged? No data recorded  [facility] You [facility]? No data recorded [facility]? No data recorded  Have You Recently Had Any Thoughts About Hurting Yourself? Yes  Are You Planning to Commit Suicide/Harm Yourself At This time? No   Have you Recently Had Thoughts About Hurting Someone Karolee Ohslse? No  Explanation: No data recorded  Have You Used Any Alcohol or Drugs in the Past 24 Hours? No  How Long Ago Did You Use  Drugs or Alcohol? No data recorded What Did You Use and How Much? No data recorded  Do You Currently Have a Therapist/Psychiatrist? No  Name of Therapist/Psychiatrist: No data recorded  Have You Been Recently Discharged From Any Office Practice or Programs? No  Explanation of Discharge From Practice/Program: No data  recorded    CCA Screening Triage Referral Assessment Type of Contact: Face-to-Face  Is this Initial or Reassessment? No data recorded Date Telepsych consult ordered in [facility]:  No data recorded Time Telepsych consult ordered in [facility]:  No data recorded  Patient Reported Information Reviewed? No data recorded Patient Left Without Being Seen? No data recorded Reason for Not Completing Assessment: No data recorded  Collateral Involvement: None provided   Does Patient Have a Court Appointed Legal Guardian? No data recorded Name and Contact of Legal Guardian: No data recorded If Minor and Not Living with Parent(s), Who has Custody? n/a  Is CPS involved or ever been involved? Never  Is APS involved or ever been involved? Never   Patient Determined To Be At Risk for Harm To Self or Others Based on Review of Patient Reported Information or Presenting Complaint? No  Method: No data recorded Availability of Means: No data recorded Intent: No data recorded Notification Required: No data recorded Additional Information for Danger to Others Potential: No data recorded Additional Comments for Danger to Others Potential: No data recorded Are There Guns or Other Weapons in Your Home? No data recorded Types of Guns/Weapons: No data recorded Are These Weapons Safely Secured?                            No data recorded Who Could Verify You Are Able To Have These Secured: No data recorded Do You Have any Outstanding Charges, Pending Court Dates, Parole/Probation? No data recorded Contacted To Inform of Risk of Harm To Self or Others: No data recorded  Location of Assessment: [facility] ED   Does Patient Present under Involuntary Commitment? Yes  IVC Papers Initial File Date: 08/30/21   [place] of Residence: [place]   Patient Currently Receiving the Following Services: Medication Management   Determination of Need: Urgent (48 hours)   Options For Referral: ED Visit; Medication Management;  Inpatient Hospitalization; Outpatient Therapy     CCA Biopsychosocial Intake/Chief Complaint:  No data recorded Current Symptoms/Problems: No data recorded  Patient Reported Schizophrenia/Schizoaffective Diagnosis in Past: No   Strengths: Patient able to communicate and verbalize needs.  Preferences: No data recorded Abilities: No data recorded  Type of Services Patient Feels are Needed: No data recorded  Initial Clinical Notes/Concerns: No data recorded  Mental Health Symptoms Depression:   Change in energy/activity; Difficulty Concentrating; Fatigue; Hopelessness; Tearfulness; Sleep (too much or little)   Duration of Depressive symptoms:  Greater than two weeks   Mania:   None   Anxiety:    Difficulty concentrating; Fatigue; Restlessness; Sleep; Worrying   Psychosis:   Delusions; Hallucinations   Duration of Psychotic symptoms:  Less than six months   Trauma:   None   Obsessions:   None   Compulsions:   Disrupts with routine/functioning   Inattention:   None   Hyperactivity/Impulsivity:   None   Oppositional/Defiant Behaviors:   None   Emotional Irregularity:   Potentially harmful impulsivity; Transient, stress-related paranoia/disassociation   Other Mood/Personality Symptoms:  No data recorded   Mental Status Exam Appearance and self-care  Stature:   Tall   Weight:  Average weight   Clothing:   Casual   Grooming:   Normal   Cosmetic use:   None   Posture/gait:   Slumped   Motor activity:   Not Remarkable   Sensorium  Attention:   Normal   Concentration:   Anxiety interferes; Preoccupied   Orientation:   X5   Recall/memory:   Normal   Affect and Mood  Affect:   Anxious; Depressed; Tearful   Mood:   Anxious; Depressed   Relating  Eye contact:   Normal   Facial expression:   Anxious; Depressed; Sad; Fearful   Attitude toward examiner:   Cooperative   Thought and Language  Speech flow:  Pressured;  Clear and Coherent   Thought content:   Appropriate to Mood and Circumstances   Preoccupation:   None   Hallucinations:   Auditory   Organization:  No data recorded  [employer] of Knowledge:   Average   Intelligence:   Average   Abstraction:   Normal   Judgement:   Impaired   Reality Testing:   Distorted   Insight:   Fair   Decision Making:   Impulsive   Social Functioning  Social Maturity:   Impulsive   Social Judgement:   Normal   Stress  Stressors:  No data recorded  Coping Ability:   Human resources officer Deficits:   Decision making   Supports:   Support needed     Religion:    Leisure/Recreation:    Exercise/Diet: Exercise/Diet Do You Have Any Trouble Sleeping?: Yes Explanation of Sleeping Difficulties: Patient reports he has not slept in 3 days.   CCA Employment/Education Employment/Work Situation: Employment / Work Situation Employment Situation: Unemployed  Education:     CCA Family/Childhood History Family and Relationship History:    Childhood History:     Child/Adolescent Assessment:     CCA Substance Use Alcohol/Drug Use: Alcohol / Drug Use Pain Medications: See MAR Prescriptions: See MAR Over the Counter: See MAR History of alcohol / drug use?: No history of alcohol / drug abuse                         ASAM's:  Six Dimensions of Multidimensional Assessment  Dimension 1:  Acute Intoxication and/or Withdrawal Potential:      Dimension 2:  Biomedical Conditions and Complications:      Dimension 3:  Emotional, Behavioral, or Cognitive Conditions and Complications:     Dimension 4:  Readiness to Change:     Dimension 5:  Relapse, Continued use, or Continued Problem Potential:     Dimension 6:  Recovery/Living Environment:     ASAM Severity Nielsen:    ASAM Recommended Level of Treatment:     Substance use Disorder (SUD)    Recommendations for Services/Supports/Treatments:     DSM5 Diagnoses: Patient Active Problem List   Diagnosis Date Noted   Cannabis use disorder, moderate, dependence (HCC) 05/04/2021   Bipolar 1 disorder (HCC) 05/03/2021   Schizo affective schizophrenia (HCC) 05/03/2021   Hallucination 05/03/2021   Bipolar affective disorder, currently manic, severe, with psychotic features (HCC) 05/03/2021    Patient Centered Plan: Patient is on the following Treatment Plan(s):  Anxiety and Depression   Referrals to Alternative Service(s): Referred to Alternative Service(s):   Place:   Date:   Time:    Referred to Alternative Service(s):   Place:   Date:   Time:    Referred to Alternative Service(s):  Place:   Date:   Time:    Referred to Alternative Service(s):   Place:   Date:   Time:      @BHCOLLABOFCARE @  Ski Polich R , Counselor, LCAS-A

## 2021-08-30 NOTE — ED Notes (Signed)
IVC  CONSULT  DONE  PENDING  PLACEMENT 

## 2021-08-30 NOTE — ED Notes (Signed)
INVOLUNTARY with all papers on chart/awaiting TTS/PSYCH consult ?

## 2021-08-30 NOTE — Consult Note (Cosign Needed Addendum)
[facility] Consult   Reason for Consult:  paranoia and hallucinations Referring Physician:  Archie Balboa  Patient Identification: Nicholas Nielsen MRN:  [id number] Principal Diagnosis: Bipolar affective disorder, currently manic, severe, with psychotic features ([place]) Diagnosis:  Principal Problem:   Bipolar affective disorder, currently manic, severe, with psychotic features ([place])   Total Time spent with patient: 45 minutes  Subjective: "There are really beatings in my head, like a machine." Nicholas Nielsen is a 41 y.o. male patient admitted with hallucinations.  HPI: Patient seen and chart reviewed.  Patient was discharged from inpatient unit here in November 2022.  Patient states that he ran out of medication and December 2022 and was unable to get the provider to prescribe again.  Patient states that he has been having worsening depression and "seizures" where his heart feels like it is cannot explode and he gets very anxious.  Patient states he is having trouble with sleep patterns. Describes disturbed patterns of sleep, where sometimes he sleeps a lot and others "up for days." Patient reports taking only 300 mg of lithium once a day because he does not like the way the higher dose makes him feel. Patient admits to saying he has suicidal thoughts. But says the voices in his head make him feel so bad that he says things he doesn't mean.  He denies homicidal thoughts, but again, says that the voices make him feel aggressive. He endorses auditory hallucinations. No current visual hallucinations, but has had some in the past. Denies illicit drug use. Patient tearful, visibly distressed.  Recommend inpatient psychiatric hospitalization.  Lithium level is 0.15  Past Psychiatric History:   Risk to Self:   Risk to Others:   Prior Inpatient Therapy:   Prior Outpatient Therapy:    Past Medical History:  Past Medical History:  Diagnosis Date   Back pain    Bipolar 1 disorder  ([place])    Hepatitis C    Kidney stones    Schizo affective schizophrenia [facility])    Testicular cyst    No past surgical history on file. Family History:  Family History  Problem Relation Age of Onset   Stroke Father    Hypertension Father    Family Psychiatric  History: unknown Social History:  Social History   Substance and Sexual Activity  Alcohol Use Not Currently     Social History   Substance and Sexual Activity  Drug Use Yes   Frequency: 7.0 times per week   Types: Marijuana   Comment: Daily use; last use is 03/26/2019    Social History   Socioeconomic History   Marital status: Single    Spouse name: Not on file   Number of children: Not on file   Years of education: Not on file   Highest education level: Not on file  Occupational History   Occupation: Unemployed  Tobacco Use   Smoking status: Every Day    Packs/day: 1.00    Years: 15.00    Pack years: 15.00    Types: Cigarettes   Smokeless tobacco: Never  Vaping Use   Vaping Use: Never used  Substance and Sexual Activity   Alcohol use: Not Currently   Drug use: Yes    Frequency: 7.0 times per week    Types: Marijuana    Comment: Daily use; last use is 03/26/2019   Sexual activity: Never    Birth control/protection: Condom  Other Topics Concern   Not on file  Social History Narrative  Pt stated that he is currently living with his brother.  He lives in [place] and is unemployed.  Pt stated that he recently arranged an appointment with [facility].   Social Determinants of Health   Financial Resource Strain: Not on file  Food Insecurity: Not on file  Transportation Needs: Not on file  Physical Activity: Not on file  Stress: Not on file  Social Connections: Not on file   Additional Social History:    Allergies:   Allergies  Allergen Reactions   Quetiapine Fumarate Other (See Comments)    Wake up with "Locked jaw" with high doses, 300-400 mg   Latex Rash    Labs:  Results for orders placed  or performed during the hospital encounter of 08/30/21 (from the past 48 hour(s))  Comprehensive metabolic panel     Status: Abnormal   Collection Time: 08/30/21  3:12 PM  Result Value Ref Range   Sodium 140 135 - 145 mmol/L   Potassium 3.7 3.5 - 5.1 mmol/L   Chloride 105 98 - 111 mmol/L   CO2 25 22 - 32 mmol/L   Glucose, Bld 140 (H) 70 - 99 mg/dL    Comment: Glucose reference range applies only to samples taken after fasting for at least 8 hours.   BUN 14 6 - 20 mg/dL   Creatinine, Ser 6.38 0.61 - 1.24 mg/dL   Calcium 9.3 8.9 - 46.6 mg/dL   Total Protein 7.2 6.5 - 8.1 g/dL   Albumin 4.0 3.5 - 5.0 g/dL   AST 26 15 - 41 U/L   ALT 24 0 - 44 U/L   Alkaline Phosphatase 84 38 - 126 U/L   Total Bilirubin 0.4 0.3 - 1.2 mg/dL   GFR, Estimated >59 >93 mL/min    Comment: (NOTE) Calculated using the CKD-EPI Creatinine Equation (2021)    Anion gap 10 5 - 15    Comment: Performed at [facility], [address] [place], [place] [address]  Ethanol     Status: None   Collection Time: 08/30/21  3:12 PM  Result Value Ref Range   Alcohol, Ethyl (B) <10 <10 mg/dL    Comment: (NOTE) Lowest detectable limit for serum alcohol is 10 mg/dL.  For medical purposes only. Performed at [facility], [address] [place], [place] [address]   Salicylate level     Status: Abnormal   Collection Time: 08/30/21  3:12 PM  Result Value Ref Range   Salicylate Lvl <7.0 (L) 7.0 - 30.0 mg/dL    Comment: Performed at [facility], [address] [place], [place] [address]  Acetaminophen level     Status: Abnormal   Collection Time: 08/30/21  3:12 PM  Result Value Ref Range   Acetaminophen (Tylenol), Serum <10 (L) 10 - 30 ug/mL    Comment: (NOTE) Therapeutic concentrations vary significantly. A range of 10-30 ug/mL  may be an effective concentration for many patients. However, some  are best treated at concentrations outside of this range. Acetaminophen concentrations >150  ug/mL at 4 hours after ingestion  and >50 ug/mL at 12 hours after ingestion are often associated with  toxic reactions.  Performed at [facility], [address] [place], [place] [address]   cbc     Status: None   Collection Time: 08/30/21  3:12 PM  Result Value Ref Range   WBC 7.8 4.0 - 10.5 K/uL   RBC 4.65 4.22 - 5.81 MIL/uL   Hemoglobin 15.0 13.0 - 17.0 g/dL  HCT 44.9 39.0 - 52.0 %   MCV 96.6 80.0 - 100.0 fL   MCH 32.3 26.0 - 34.0 pg   MCHC 33.4 30.0 - 36.0 g/dL   RDW 12.7 11.5 - 15.5 %   Platelets 186 150 - 400 K/uL   nRBC 0.0 0.0 - 0.2 %    Comment: Performed at [facility], [address], [place], [place] [address]  Resp Panel by RT-PCR (Flu A&B, Covid) Nasopharyngeal Swab     Status: None   Collection Time: 08/30/21  3:12 PM   Specimen: Nasopharyngeal Swab; Nasopharyngeal(NP) swabs in vial transport medium  Result Value Ref Range   SARS Coronavirus 2 by RT PCR NEGATIVE NEGATIVE    Comment: (NOTE) SARS-CoV-2 target nucleic acids are NOT DETECTED.  The SARS-CoV-2 RNA is generally detectable in upper respiratory specimens during the acute phase of infection. The lowest concentration of SARS-CoV-2 viral copies this assay can detect is 138 copies/mL. A negative result does not preclude SARS-Cov-2 infection and should not be used as the sole basis for treatment or other patient management decisions. A negative result may occur with  improper specimen collection/handling, submission of specimen other than nasopharyngeal swab, presence of viral mutation(s) within the areas targeted by this assay, and inadequate number of viral copies(<138 copies/mL). A negative result must be combined with clinical observations, patient history, and epidemiological information. The expected result is Negative.  Fact Sheet for Patients:  EntrepreneurPulse.com.au  Fact Sheet for Healthcare Providers:   IncredibleEmployment.be  This test is no t yet approved or cleared by the [place] FDA and  has been authorized for detection and/or diagnosis of SARS-CoV-2 by FDA under an Emergency Use Authorization (EUA). This EUA will remain  in effect (meaning this test can be used) for the duration of the COVID-19 declaration under Section 564(b)(1) of the Act, 21 U.S.C.section 360bbb-3(b)(1), unless the authorization is terminated  or revoked sooner.       Influenza A by PCR NEGATIVE NEGATIVE   Influenza B by PCR NEGATIVE NEGATIVE    Comment: (NOTE) The Xpert Xpress SARS-CoV-2/FLU/RSV plus assay is intended as an aid in the diagnosis of influenza from Nasopharyngeal swab specimens and should not be used as a sole basis for treatment. Nasal washings and aspirates are unacceptable for Xpert Xpress SARS-CoV-2/FLU/RSV testing.  Fact Sheet for Patients: EntrepreneurPulse.com.au  Fact Sheet for Healthcare Providers: IncredibleEmployment.be  This test is not yet approved or cleared by the [place] FDA and has been authorized for detection and/or diagnosis of SARS-CoV-2 by FDA under an Emergency Use Authorization (EUA). This EUA will remain in effect (meaning this test can be used) for the duration of the COVID-19 declaration under Section 564(b)(1) of the Act, 21 U.S.C. section 360bbb-3(b)(1), unless the authorization is terminated or revoked.  Performed at [facility], [address] [place], [place] [address]   Lithium level     Status: Abnormal   Collection Time: 08/30/21  3:20 PM  Result Value Ref Range   Lithium Lvl 0.15 (L) 0.60 - 1.20 mmol/L    Comment: Performed at [facility], [address] [place], [place] [address]  Urine Drug Screen, Qualitative     Status: None   Collection Time: 08/30/21  3:51 PM  Result Value Ref Range   Tricyclic, Ur Screen NONE DETECTED NONE DETECTED    Amphetamines, Ur Screen NONE DETECTED NONE DETECTED   MDMA (Ecstasy)Ur Screen NONE DETECTED NONE DETECTED   Cocaine Metabolite,Ur [place] NONE DETECTED NONE DETECTED  Opiate, Ur Screen NONE DETECTED NONE DETECTED   Phencyclidine (PCP) Ur S NONE DETECTED NONE DETECTED   Cannabinoid 50 Ng, Ur [place] NONE DETECTED NONE DETECTED   Barbiturates, Ur Screen NONE DETECTED NONE DETECTED   Benzodiazepine, Ur Scrn NONE DETECTED NONE DETECTED   Methadone Scn, Ur NONE DETECTED NONE DETECTED    Comment: (NOTE) Tricyclics + metabolites, urine    Cutoff 1000 ng/mL Amphetamines + metabolites, urine  Cutoff 1000 ng/mL MDMA (Ecstasy), urine              Cutoff 500 ng/mL Cocaine Metabolite, urine          Cutoff 300 ng/mL Opiate + metabolites, urine        Cutoff 300 ng/mL Phencyclidine (PCP), urine         Cutoff 25 ng/mL Cannabinoid, urine                 Cutoff 50 ng/mL Barbiturates + metabolites, urine  Cutoff 200 ng/mL Benzodiazepine, urine              Cutoff 200 ng/mL Methadone, urine                   Cutoff 300 ng/mL  The urine drug screen provides only a preliminary, unconfirmed analytical test result and should not be used for non-medical purposes. Clinical consideration and professional judgment should be applied to any positive drug screen result due to possible interfering substances. A more specific alternate chemical method must be used in order to obtain a confirmed analytical result. Gas chromatography / mass spectrometry (GC/MS) is the preferred confirm atory method. Performed at [facility], [address] [place], [place] [address]     Current Facility-Administered Medications  Medication Dose Route Frequency Provider Last Rate Last Admin   hydrOXYzine (ATARAX) tablet 50 mg  50 mg Oral TID PRN Sherlon Handing, NP   50 mg at 08/30/21 1658   nicotine (NICODERM CQ - dosed in mg/24 hours) patch 14 mg  14 mg Transdermal Daily Waldon Merl F, NP       nicotine polacrilex  (NICORETTE) gum 2 mg  2 mg Oral PRN Sherlon Handing, NP       risperiDONE (RISPERDAL) tablet 3 mg  3 mg Oral QHS Sherlon Handing, NP       Current Outpatient Medications  Medication Sig Dispense Refill   amLODipine (NORVASC) 5 MG tablet Take 1 tablet (5 mg total) by mouth daily. 10 tablet 0   amLODipine (NORVASC) 5 MG tablet Take 1 tablet (5 mg total) by mouth daily. 30 tablet 1   benztropine (COGENTIN) 0.5 MG tablet Take 2 tablets (1 mg total) by mouth 2 (two) times daily. 40 tablet 0   benztropine (COGENTIN) 1 MG tablet Take 1 tablet (1 mg total) by mouth 2 (two) times daily. 60 tablet 1   hydrOXYzine (ATARAX/VISTARIL) 50 MG tablet Take 1 tablet (50 mg total) by mouth every 6 (six) hours as needed for anxiety. 20 tablet 0   hydrOXYzine (ATARAX/VISTARIL) 50 MG tablet Take 1 tablet (50 mg total) by mouth every 6 (six) hours as needed for anxiety. 60 tablet 1   lithium carbonate (LITHOBID) 300 MG CR tablet Take 2 tablets (600 mg total) by mouth every 12 (twelve) hours. 40 tablet 0   lithium carbonate (LITHOBID) 300 MG CR tablet Take 2 tablets (600 mg total) by mouth every 12 (twelve) hours. 120 tablet 1   nicotine (NICODERM CQ - DOSED IN  MG/24 HOURS) 14 mg/24hr patch Place 1 patch (14 mg total) onto the skin daily. 14 patch 0   nicotine (NICODERM CQ - DOSED IN MG/24 HOURS) 14 mg/24hr patch Place 1 patch (14 mg total) onto the skin daily. 28 patch 1   propranolol (INDERAL) 20 MG tablet Take 1 tablet (20 mg total) by mouth 2 (two) times daily. 20 tablet 0   propranolol (INDERAL) 20 MG tablet Take 1 tablet (20 mg total) by mouth 2 (two) times daily. 60 tablet 1   risperiDONE (RISPERDAL) 2 MG tablet Take 3 tablets (6 mg total) by mouth 2 (two) times daily. 60 tablet 0   risperiDONE (RISPERDAL) 3 MG tablet Take 2 tablets (6 mg total) by mouth 2 (two) times daily. 120 tablet 1   temazepam (RESTORIL) 15 MG capsule Take 1 capsule (15 mg total) by mouth at bedtime as needed for sleep. 30 capsule 1     Musculoskeletal: Strength & Muscle Tone: within normal limits Gait & Station: normal Patient leans: N/A  Psychiatric Specialty Exam:  Presentation  General Appearance: Appropriate for Environment  Eye Contact:Good  Speech:Clear and Coherent  Speech Volume:Normal  Handedness:Right   Mood and Affect  Mood:Depressed  Affect:Tearful; Congruent   Thought Process  Thought Processes:Coherent  Descriptions of Associations:Intact  Orientation:Full (Time, Place and Person)  Thought Content:WDL  History of Schizophrenia/Schizoaffective disorder:No  Duration of Psychotic Symptoms:Less than six months  Hallucinations:Hallucinations: Auditory  Ideas of Reference:Paranoia; Delusions  Suicidal Thoughts:Suicidal Thoughts: Yes, Passive SI Passive Intent and/or Plan: Without Intent; Without Plan  Homicidal Thoughts:Homicidal Thoughts: No   Sensorium  Memory:Immediate Good  Judgment:Fair  Insight:Good   Executive Functions  Concentration:Fair  Attention Span:Good  [place] of Knowledge:Good  Language:Good   Psychomotor Activity  Psychomotor Activity:Psychomotor Activity: Normal  Assets  Assets:Desire for Improvement; Armed forces logistics/support/administrative officer; Financial Resources/Insurance; Housing; Resilience; Physical Health   Sleep  Sleep:Sleep: Poor  Physical Exam: Physical Exam Vitals and nursing note reviewed.  HENT:     Head: Normocephalic.     Nose: No congestion or rhinorrhea.  Eyes:     General:        Right eye: No discharge.        Left eye: No discharge.  Cardiovascular:     Rate and Rhythm: Normal rate.  Pulmonary:     Effort: Pulmonary effort is normal.  Musculoskeletal:        General: Normal range of motion.     Cervical back: Normal range of motion.  Skin:    General: Skin is dry.  Neurological:     Mental Status: He is oriented to person, place, and time.   Review of Systems  Psychiatric/Behavioral:  Positive for depression.  The patient is nervous/anxious.   All other systems reviewed and are negative. Blood pressure (!) 135/94, pulse 96, temperature 98.2 F (36.8 C), temperature source Oral, resp. rate 18, height 6\' 1"  (1.854 m), weight 67.6 kg, SpO2 97 %. Body mass index is 19.66 kg/m.  Treatment Plan Summary: Daily contact with patient to assess and evaluate symptoms and progress in treatment and Medication management  Disposition: Recommend psychiatric Inpatient admission when medically cleared.  Sherlon Handing, NP 08/30/2021 5:18 PM

## 2021-08-30 NOTE — ED Provider Notes (Signed)
[facility] Provider Note    Event Date/Time   First MD Initiated Contact with Patient 08/30/21 1605     (approximate)   History   Paranoid   HPI  Nicholas Nielsen is a 41 y.o. male  who, per discharge summary dated 05/30/2021 had admission for psychosis, paranoia and hallucinations, presents to the emergency department today under IVC because of concerning behavior.  On my exam the patient has complaints that something has entered his left ear.  He states he also has been hearing voices.  He says he has been taking his lithium intermittently.      Physical Exam   Triage Vital Signs: ED Triage Vitals  Enc Vitals Group     BP 08/30/21 1508 (!) 135/94     Pulse Rate 08/30/21 1508 96     Resp 08/30/21 1508 18     Temp 08/30/21 1508 98.2 F (36.8 C)     Temp Source 08/30/21 1508 Oral     SpO2 08/30/21 1508 97 %     Weight 08/30/21 1509 149 lb 0.5 oz (67.6 kg)     Height 08/30/21 1509 6\' 1"  (1.854 m)     Head Circumference --      Peak Flow --      Pain Score 08/30/21 1509 5     Pain Loc --      Pain Edu? --      Excl. in GC? --     Most recent vital signs: Vitals:   08/30/21 1508  BP: (!) 135/94  Pulse: 96  Resp: 18  Temp: 98.2 F (36.8 C)  SpO2: 97%    General: Awake, slightly agitated CV:  Good peripheral perfusion. Regular rate. No murmurs. Resp:  Normal effort. Clear to ausculation Abd:  No distention.  Psych:  Paranoia. Agitation.   ED Results / Procedures / Treatments   Labs (all labs ordered are listed, but only abnormal results are displayed) Labs Reviewed  COMPREHENSIVE METABOLIC PANEL - Abnormal; Notable for the following components:      Result Value   Glucose, Bld 140 (*)    All other components within normal limits  SALICYLATE LEVEL - Abnormal; Notable for the following components:   Salicylate Lvl <7.0 (*)    All other components within normal limits  ACETAMINOPHEN LEVEL - Abnormal; Notable for the following  components:   Acetaminophen (Tylenol), Serum <10 (*)    All other components within normal limits  RESP PANEL BY RT-PCR (FLU A&B, COVID) ARPGX2  ETHANOL  CBC  URINE DRUG SCREEN, QUALITATIVE ([facility] ONLY)     EKG  None   RADIOLOGY None   PROCEDURES:  Critical Care performed: No  Procedures   MEDICATIONS ORDERED IN ED: Medications - No data to display   IMPRESSION / MDM / ASSESSMENT AND PLAN / ED COURSE  I reviewed the triage vital signs and the nursing notes.                              Differential diagnosis includes, but is not limited to, drug induced psychosis, infection, psychiatric illness.  Patient presented to the emergency department today because of concerns for abnormal behavior and patient did present under IVC.  On my exam he does display symptoms of psychosis.  Blood work without any concerning leukocytosis or electrolyte abnormality.  At this time will continue IVC and have psychiatry team evaluate.  The patient has  been placed in psychiatric observation due to the need to provide a safe environment for the patient while obtaining psychiatric consultation and evaluation, as well as ongoing medical and medication management to treat the patient's condition.  The patient has been placed under full IVC at this time.    FINAL CLINICAL IMPRESSION(S) / ED DIAGNOSES   Final diagnoses:  Psychosis, unspecified psychosis type (HCC)     Rx / DC Orders   ED Discharge Orders     None        Note:  This document was prepared using Dragon voice recognition software and may include unintentional dictation errors.    Phineas Semen, MD 08/30/21 [phone]

## 2021-08-30 NOTE — ED Notes (Signed)
Psych provider at bedside

## 2021-08-30 NOTE — ED Notes (Signed)
Report called to [facility] nurse [facility] RN. Patient to be transferred to [facility] room 320.

## 2021-08-30 NOTE — ED Notes (Addendum)
Pt belongings:  1 red shirt 1 pair of olive green pants 1 pair of green and orange slippers 1 pair of white socks 1 pair of gray and black underwear 1 black wallet 1 black phone 1 silver necklace and ring

## 2021-08-30 NOTE — ED Notes (Addendum)
Pt stated his heart was "beating out of his chest."  Pt denied chest pain.  VS [facility].  Pt had received PO anxiety medication an hour prior. NP made aware.

## 2021-08-31 DIAGNOSIS — F259 Schizoaffective disorder, unspecified: Secondary | ICD-10-CM | POA: Diagnosis not present

## 2021-08-31 LAB — LIPID PANEL
Cholesterol: 207 mg/dL — ABNORMAL HIGH (ref 0–200)
HDL: 31 mg/dL — ABNORMAL LOW (ref >40)
LDL Cholesterol: 123 mg/dL — ABNORMAL HIGH (ref 0–99)
Total CHOL/HDL Ratio: 6.7 RATIO
Triglycerides: 265 mg/dL — ABNORMAL HIGH (ref <150)
VLDL: 53 mg/dL — ABNORMAL HIGH (ref 0–40)

## 2021-08-31 LAB — HEMOGLOBIN A1C
Hgb A1c MFr Bld: 5.2 % (ref 4.8–5.6)
Mean Plasma Glucose: 103 mg/dL

## 2021-08-31 MED ORDER — PANTOPRAZOLE SODIUM 40 MG PO TBEC
40.0000 mg | DELAYED_RELEASE_TABLET | Freq: Every day | ORAL | Status: DC
Start: 2021-08-31 — End: 2021-09-12
  Administered 2021-08-31 – 2021-09-12 (×13): 40 mg via ORAL
  Filled 2021-08-31 (×12): qty 1

## 2021-08-31 NOTE — Group Note (Addendum)
[facility] LCSW Group Therapy Note ? ? ?Group Date: 08/31/2021 ?Start Time: 1300 ?End Time: 1400 ? ? ?Type of Therapy/Topic:  Group Therapy:  Emotion Regulation ? ?Participation Level:  Active  ? ? ?Description of Group:   ? The purpose of this group is to assist patients in learning to regulate negative emotions and experience positive emotions. Patients will be guided to discuss ways in which they have been vulnerable to their negative emotions. These vulnerabilities will be juxtaposed with experiences of positive emotions or situations, and patients challenged to use positive emotions to combat negative ones. Special emphasis will be placed on coping with negative emotions in conflict situations, and patients will process healthy conflict resolution skills. ? ?Therapeutic Goals: ?Patient will identify two positive emotions or experiences to reflect on in order to balance out negative emotions:  ?Patient will label two or more emotions that they find the most difficult to experience:  ?Patient will be able to demonstrate positive conflict resolution skills through discussion or role plays:  ? ?Summary of Patient Progress: ? ? ?Patient was present for the entirety of the group session. Patient was an active listener and participated in the topic of discussion, provided helpful advice to others, and added nuance to topic of conversation.   ? ? ? ?Therapeutic Modalities:   ?Cognitive Behavioral Therapy ?Feelings Identification ?Dialectical Behavioral Therapy ? ? ?Rosezella [place], Student-Social Work ?

## 2021-08-31 NOTE — BHH Counselor (Signed)
Adult Comprehensive Assessment ? ?Patient ID: Nicholas Nielsen, male   DOB: [birth year], 41 y.o.   MRN: [id number] ? ?Information Source: ?Information source: Patient ? ?Current Stressors:  ?Patient states their primary concerns and needs for treatment are:: Patient states that he ran out of meds and began to hear voices, become aggressive, and break things in his home. ?Patient states their goals for this hospitilization and ongoing recovery are:: Patient states he would like to, "get abck on meds and stop the visions <vivid dreams>" ?Educational / Learning stressors: none reported ?Employment / Job issues: none reported ?Family Relationships: Patient states, "I do not really talk to them" ?Financial / Lack of resources (include bankruptcy): none reported ?Housing / Lack of housing: Patient states he is unsure if he can return to his mothers after destroying the property. ?Physical health (include injuries & life threatening diseases): Endorses chet pain hx of seizures and cest pain. ?Social relationships: Patient states he does not have any friends as he self isolates. ?Substance abuse: Patient denies substances use. UDS negative for all, BAC <10 ?Bereavement / Loss: none reported ? ?Living/Environment/Situation:  ?Living Arrangements: Parent ?Living conditions (as described by patient or guardian): States living conditions are WNL. ?Who else lives in the home?: Patient lives with mother, brother, and sister. ?How long has patient lived in current situation?: 4-5 years ?What is atmosphere in current home: Comfortable ? ?Family History:  ?Marital status: Single ?Does patient have children?: Yes ?How many children?: 2 ?How is patient's relationship with their children?: Pt denies any relationship with his children. ? ?Childhood History:  ?By whom was/is the patient raised?: Both parents ?Additional childhood history information: "Lived with my parents until 89 or 2 when I moved out." ?Description of patient's  relationship with caregiver when they were a child: Patient states he had a  "Good" relationship with his mother and father ?Patient's description of current relationship with people who raised him/her: Patient states he still talks to both his parents. ?Does patient have siblings?: Yes ?Number of Siblings: 3 ?Description of patient's current relationship with siblings: Patient states, "pretty good, we talk" ?Did patient suffer any verbal/emotional/physical/sexual abuse as a child?: Yes (Patient refused to provide any deatils about abuse, time/perpetrator/therapy) ?Was the patient ever a victim of a crime or a disaster?: No ?Witnessed domestic violence?: No ?Has patient been affected by domestic violence as an adult?: No ? ?Education:  ?Highest grade of school patient has completed: 10th grade ?Currently a student?: No ?Learning disability?: No ? 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 ?Has Patient ever Been in the Military?: No ? ?Financial Resources:   ?Financial resources: No income ?Does patient have a representative payee or guardian?: No ? 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 ?If attempted suicide, did drugs/alcohol play a role in this?:  (n/a) ?Alcohol/Substance Abuse Treatment Hx: Past Tx, Inpatient ?If yes, describe treatment: Describes his past SUD tx as "some place in [place]" ?Has alcohol/substance abuse ever caused legal problems?: No ? ?Social Support System:   ?Patient's Community Support System: Poor ?Type of faith/religion:  Christian ?How does patient's faith help to cope with current illness?: CSW referred patient to chaplain services. ? ?Leisure/Recreation:   ?Do You Have Hobbies?: No ? ?Strengths/Needs:   ?Patient states these barriers may affect/interfere with their treatment: none reported ?Patient states these barriers may affect their return to the community: none reported ?Other important information patient would like considered in planning for their treatment: none reported ? ?Discharge Plan:   ?Currently receiving community mental health services: Yes (From Whom) [facility]) ?Does patient have access to transportation?: No ?Does patient have financial barriers related to discharge medications?: Yes (No insurance noted.) ?Will patient be returning to same living situation after discharge?:  (Patient to have conversation with mother to determine whether he can return to residence.) ? ?Summary/Recommendations:   ?Summary and Recommendations (to be completed by the evaluator): 41 y/o single male w/ dx of Schizophrenia, Undifferentiated (F20.3) from [place] w/ no insurance; Admitted due to ongoing hallucinations and increased aggression in context of medication noncompliance. During assessment, patient endorses much of the same complaints; auditory hallucinations, aggression, and "visions." Patient states he has vivid and disturbing "visions" when he closes his eyes. Further states he has not taken medication for about 8-9 months and can not get his medications filled. Patient would like to get back on his medication and "stop the visions." Endorses stress related to interpersonal conflict, physical health, and housing. Endorses childhood tx, however, refuses to provide details or mention what type of trauma specifically, "just put physical." Patient noticeably changed affect when asked about childhood trauma. 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 Patient has  no listed insurance or income. 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 Presents w/ blunted affect and dissociation, oriented to time/place/person/situation, speech is monotone and slightly slowed, appearance is relatively WNL, insight and judgment is fair. Therapeutic recommendations include crisis stabilization, medication management, group therapy, and case management. ? ?Durenda Hurt. 08/31/2021 ?

## 2021-08-31 NOTE — BHH Suicide Risk Assessment (Signed)
B[facility] INPATIENT:  Family/Significant Other Suicide Prevention Education ? ?Suicide Prevention Education:  ?Education Completed; Nicholas Nielsen,  (Mother) has been identified by the patient as the family member/significant other with whom the patient will be residing, and identified as the person(s) who will aid the patient in the event of a mental health crisis (suicidal ideations/suicide attempt).  With written consent from the patient, the family member/significant other has been provided the following suicide prevention education, prior to the and/or following the discharge of the patient. ? ?The suicide prevention education provided includes the following: ?Suicide risk factors ?Suicide prevention and interventions ?National Suicide Hotline telephone number ?[facility] assessment telephone number ?[facility] Emergency Assistance 911 ?[place] and/or Residential Mobile Crisis Unit telephone number ? ?Request made of family/significant other to: ?Remove weapons (e.g., guns, rifles, knives), all items previously/currently identified as safety concern.   ?Remove drugs/medications (over-the-counter, prescriptions, illicit drugs), all items previously/currently identified as a safety concern. ? ?The family member/significant other verbalizes understanding of the suicide prevention education information provided.  The family member/significant other agrees to remove the items of safety concern listed above. ? ?Nicholas Nielsen ?08/31/2021, 3:45 PM ?

## 2021-08-31 NOTE — Plan of Care (Signed)
Patient denies AVH ? ?Problem: Education: ?Goal: Emotional status will improve ?Outcome: Progressing ?Goal: Mental status will improve ?Outcome: Progressing ?  ?

## 2021-08-31 NOTE — Progress Notes (Signed)
[facility] LCSW Progress Note ?  ?08/31/2021    3:41 PM ? ?Nicholas Nielsen  ? ?[id number]  ? ?Type of Contact and Topic:  Release of Information ? ?Nicholas Nielsen, patient, provided written consent for CSW team to coordinate aftercare at this time. CSW to have further conversations with patient regarding care in the community.  ? ?Patient states he is seen at [facility] in [place].  ? ?Signed:  ?Durenda Hurt, MSW, LCSWA, LCAS ?08/31/2021 3:41 PMq ?

## 2021-08-31 NOTE — BH IP Treatment Plan (Signed)
Interdisciplinary Treatment and Diagnostic Plan Update  08/31/2021 Time of Session: 0930 Nicholas Nielsen MRN: [id number]  Principal Diagnosis: Schizo affective schizophrenia [facility])  Secondary Diagnoses: Principal Problem:   Schizo affective schizophrenia ([place])   Current Medications:  Current Facility-Administered Medications  Medication Dose Route Frequency Provider Last Rate Last Admin   acetaminophen (TYLENOL) tablet 650 mg  650 mg Oral Q6H PRN Sherlon Handing, NP       alum & mag hydroxide-simeth (MAALOX/MYLANTA) 200-200-20 MG/5ML suspension 30 mL  30 mL Oral Q4H PRN Sherlon Handing, NP       amLODipine (NORVASC) tablet 5 mg  5 mg Oral BID Waldon Merl F, NP   5 mg at 08/31/21 5784   benztropine (COGENTIN) tablet 1 mg  1 mg Oral BID Waldon Merl F, NP   1 mg at 08/31/21 6962   hydrOXYzine (ATARAX) tablet 50 mg  50 mg Oral TID PRN Sherlon Handing, NP       lithium carbonate (LITHOBID) CR tablet 600 mg  600 mg Oral Q12H Waldon Merl F, NP   600 mg at 08/31/21 9528   magnesium hydroxide (MILK OF MAGNESIA) suspension 30 mL  30 mL Oral Daily PRN Waldon Merl F, NP       nicotine (NICODERM CQ - dosed in mg/24 hours) patch 14 mg  14 mg Transdermal Daily Waldon Merl F, NP   14 mg at 08/31/21 4132   nicotine polacrilex (NICORETTE) gum 2 mg  2 mg Oral PRN Sherlon Handing, NP       pantoprazole (PROTONIX) EC tablet 40 mg  40 mg Oral Daily Clapacs, Madie Reno, MD       propranolol (INDERAL) tablet 20 mg  20 mg Oral BID Waldon Merl F, NP   20 mg at 08/31/21 0803   risperiDONE (RISPERDAL) tablet 6 mg  6 mg Oral QHS Waldon Merl F, NP   6 mg at 08/30/21 2104   temazepam (RESTORIL) capsule 15 mg  15 mg Oral QHS PRN Sherlon Handing, NP       PTA Medications: Medications Prior to Admission  Medication Sig Dispense Refill Last Dose   amLODipine (NORVASC) 5 MG tablet Take 1 tablet (5 mg total) by mouth daily. 10 tablet 0    amLODipine (NORVASC) 5 MG tablet Take  1 tablet (5 mg total) by mouth daily. 30 tablet 1    benztropine (COGENTIN) 0.5 MG tablet Take 2 tablets (1 mg total) by mouth 2 (two) times daily. 40 tablet 0    benztropine (COGENTIN) 1 MG tablet Take 1 tablet (1 mg total) by mouth 2 (two) times daily. 60 tablet 1    hydrOXYzine (ATARAX/VISTARIL) 50 MG tablet Take 1 tablet (50 mg total) by mouth every 6 (six) hours as needed for anxiety. 20 tablet 0    hydrOXYzine (ATARAX/VISTARIL) 50 MG tablet Take 1 tablet (50 mg total) by mouth every 6 (six) hours as needed for anxiety. 60 tablet 1    lithium carbonate (LITHOBID) 300 MG CR tablet Take 2 tablets (600 mg total) by mouth every 12 (twelve) hours. 40 tablet 0    lithium carbonate (LITHOBID) 300 MG CR tablet Take 2 tablets (600 mg total) by mouth every 12 (twelve) hours. 120 tablet 1    nicotine (NICODERM CQ - DOSED IN MG/24 HOURS) 14 mg/24hr patch Place 1 patch (14 mg total) onto the skin daily. 14 patch 0    nicotine (NICODERM CQ - DOSED IN MG/24 HOURS) 14 mg/24hr patch  Place 1 patch (14 mg total) onto the skin daily. 28 patch 1    propranolol (INDERAL) 20 MG tablet Take 1 tablet (20 mg total) by mouth 2 (two) times daily. 20 tablet 0    propranolol (INDERAL) 20 MG tablet Take 1 tablet (20 mg total) by mouth 2 (two) times daily. 60 tablet 1    risperiDONE (RISPERDAL) 2 MG tablet Take 3 tablets (6 mg total) by mouth 2 (two) times daily. 60 tablet 0    risperiDONE (RISPERDAL) 3 MG tablet Take 2 tablets (6 mg total) by mouth 2 (two) times daily. 120 tablet 1    temazepam (RESTORIL) 15 MG capsule Take 1 capsule (15 mg total) by mouth at bedtime as needed for sleep. 30 capsule 1     Patient Stressors:    Patient Strengths:    Treatment Modalities: Medication Management, Group therapy, Case management,  1 to 1 session with clinician, Psychoeducation, Recreational therapy.   Physician Treatment Plan for Primary Diagnosis: Schizo affective schizophrenia ([place]) Long Term Goal(s):     Short Term  Goals:    Medication Management: Evaluate patient's response, side effects, and tolerance of medication regimen.  Therapeutic Interventions: 1 to 1 sessions, Unit Group sessions and Medication administration.  Evaluation of Outcomes: Not Met  Physician Treatment Plan for Secondary Diagnosis: Principal Problem:   Schizo affective schizophrenia ([place])  Long Term Goal(s):     Short Term Goals:       Medication Management: Evaluate patient's response, side effects, and tolerance of medication regimen.  Therapeutic Interventions: 1 to 1 sessions, Unit Group sessions and Medication administration.  Evaluation of Outcomes: Not Met   RN Treatment Plan for Primary Diagnosis: Schizo affective schizophrenia ([place]) Long Term Goal(s): Knowledge of disease and therapeutic regimen to maintain health will improve  Short Term Goals: Ability to remain free from injury will improve, Ability to verbalize frustration and anger appropriately will improve, Ability to demonstrate self-control, Ability to participate in decision making will improve, Ability to verbalize feelings will improve, Ability to disclose and discuss suicidal ideas, Ability to identify and develop effective coping behaviors will improve, and Compliance with prescribed medications will improve  Medication Management: RN will administer medications as ordered by provider, will assess and evaluate patient's response and provide education to patient for prescribed medication. RN will report any adverse and/or side effects to prescribing provider.  Therapeutic Interventions: 1 on 1 counseling sessions, Psychoeducation, Medication administration, Evaluate responses to treatment, Monitor vital signs and CBGs as ordered, Perform/monitor CIWA, COWS, AIMS and Fall Risk screenings as ordered, Perform wound care treatments as ordered.  Evaluation of Outcomes: Not Met   LCSW Treatment Plan for Primary Diagnosis: Schizo affective schizophrenia  ([place]) Long Term Goal(s): Safe transition to appropriate next level of care at discharge, Engage patient in therapeutic group addressing interpersonal concerns.  Short Term Goals: Engage patient in aftercare planning with referrals and resources, Increase social support, Increase ability to appropriately verbalize feelings, Increase emotional regulation, Facilitate acceptance of mental health diagnosis and concerns, Facilitate patient progression through stages of change regarding substance use diagnoses and concerns, Identify triggers associated with mental health/substance abuse issues, and Increase skills for wellness and recovery  Therapeutic Interventions: Assess for all discharge needs, 1 to 1 time with Social worker, Explore available resources and support systems, Assess for adequacy in community support network, Educate family and significant other(s) on suicide prevention, Complete Psychosocial Assessment, Interpersonal group therapy.  Evaluation of Outcomes: Not Met   Progress in Treatment: Attending  groups: No. Participating in groups: No. Taking medication as prescribed: Yes. Toleration medication: Yes. Family/Significant other contact made: No, will contact:  CSW will obtain consent tor reach collateral.  Patient understands diagnosis: Yes. Discussing patient identified problems/goals with staff: Yes. Medical problems stabilized or resolved: Yes. Denies suicidal/homicidal ideation: No. Issues/concerns per patient self-inventory: Yes. Other: none  New problem(s) identified: Yes, Describe:  Patient continues to endorse auditory hallucinations, feelings of aggression and chronic pain. States that he went to [facility] in [place] and they refused to refill his prescription from previous hospitalization.   New Short Term/Long Term Goal(s): Patient to work towards elimination of symptoms of psychosis, medication management for mood stabilization; development of comprehensive mental  wellness plan.  Patient Goals:  Patient states, " My mental states, got pictures coming up in my head and never had that before."  Discharge Plan or Barriers: No psychosocial barriers identified at this time, patient to return to place of residence when appropriate for discharge. Reports living with mother.     Reason for Continuation of Hospitalization: Hallucinations  Estimated Length of Stay: 1-7 days    Scribe for Treatment Team: Jabaree, Mercado 08/31/2021 11:27 AM

## 2021-08-31 NOTE — BHH Suicide Risk Assessment (Signed)
[facility] Admission Suicide Risk Assessment ? ? ?Nursing information obtained from:  Patient ?Demographic factors:  NA ?Current Mental Status:  NA ?Loss Factors:  NA ?Historical Factors:  NA ?Risk Reduction Factors:  NA ? ?Total Time spent with patient: 1 hour ?Principal Problem: Schizo affective schizophrenia ([place]) ?Diagnosis:  Principal Problem: ?  Schizo affective schizophrenia ([place]) ? ?Subjective Data: Patient seen and chart reviewed.  41 year old man with a history of bipolar or schizoaffective disorder came to the hospital with psychotic symptoms hearing voices mood instability suicidal ideation and disorganized paranoid thinking.  Currently cooperative and requesting to get back on medicine.  Denies suicidal intent currently. ? ?Continued Clinical Symptoms:  ?Alcohol Use Disorder Identification Test Final Score (AUDIT): 0 ?The "Alcohol Use Disorders Identification Test", Guidelines for Use in Primary Care, Second Edition.  World Pharmacologist [facility]). ?Score between 0-7:  no or low risk or alcohol related problems. ?Score between 8-15:  moderate risk of alcohol related problems. ?Score between 16-19:  high risk of alcohol related problems. ?Score 20 or above:  warrants further diagnostic evaluation for alcohol dependence and treatment. ? ? ?CLINICAL FACTORS:  ? Schizophrenia:   Depressive state ?Paranoid or undifferentiated type ? ? ?Musculoskeletal: ?Strength & Muscle Tone: within normal limits ?Gait & Station: normal ?Patient leans: N/A ? ?Psychiatric Specialty Exam: ? ?Presentation  ?General Appearance: Appropriate for Environment ? ?Eye Contact:Good ? ?Speech:Clear and Coherent ? ?Speech Volume:Normal ? ?Handedness:Right ? ? ?Mood and Affect  ?Mood:Depressed ? ?Affect:Tearful; Congruent ? ? ?Thought Process  ?Thought Processes:Coherent ? ?Descriptions of Associations:Intact ? ?Orientation:Full (Time, Place and Person) ? ?Thought Content:WDL ? ?History of Schizophrenia/Schizoaffective disorder:No ? ?Duration  of Psychotic Symptoms:Less than six months ? ?Hallucinations:Hallucinations: Auditory ? ?Ideas of Reference:Paranoia; Delusions ? ?Suicidal Thoughts:Suicidal Thoughts: Yes, Passive ?SI Passive Intent and/or Plan: Without Intent; Without Plan ? ?Homicidal Thoughts:Homicidal Thoughts: No ? ? ?Sensorium  ?Memory:Immediate Good ? ?Judgment:Fair ? ?Insight:Good ? ? ?Executive Functions  ?Concentration:Fair ? ?Attention Span:Good ? ?Recall:Good ? ?[employer] [place] ? ?Language:Good ? ? ?Psychomotor Activity  ?Psychomotor Activity:Psychomotor Activity: Normal ? ? ?Assets  ?Assets:Desire for Improvement; Communication Skills; Financial Resources/Insurance; Housing; Resilience; Physical Health ? ? ?Sleep  ?Sleep:Sleep: Poor ? ? ? ?Physical Exam: ?Physical Exam ?Vitals and nursing note reviewed.  ?Constitutional:   ?   Appearance: Normal appearance.  ?HENT:  ?   Head: Normocephalic and atraumatic.  ?   Mouth/Throat:  ?   Pharynx: Oropharynx is clear.  ?Eyes:  ?   Pupils: Pupils are equal, round, and reactive to light.  ?Cardiovascular:  ?   Rate and Rhythm: Normal rate and regular rhythm.  ?Pulmonary:  ?   Effort: Pulmonary effort is normal.  ?   Breath sounds: Normal breath sounds.  ?Abdominal:  ?   General: Abdomen is flat.  ?   Palpations: Abdomen is soft.  ?Musculoskeletal:     ?   General: Normal range of motion.  ?Skin: ?   General: Skin is warm and dry.  ?Neurological:  ?   General: No focal deficit present.  ?   Mental Status: He is alert. Mental status is at baseline.  ?Psychiatric:     ?   Attention and Perception: He is inattentive.     ?   Mood and Affect: Mood normal. Affect is blunt.     ?   Speech: Speech is delayed.     ?   Behavior: Behavior is slowed.     ?   Thought Content: Thought content is  paranoid. Thought content does not include homicidal or suicidal ideation.     ?   Cognition and Memory: Cognition is impaired. Memory is impaired.     ?   Judgment: Judgment is impulsive.  ? ?Review of Systems   ?Constitutional: Negative.   ?HENT: Negative.    ?Eyes: Negative.   ?Respiratory: Negative.    ?Cardiovascular: Negative.   ?Gastrointestinal: Negative.   ?Musculoskeletal: Negative.   ?Skin: Negative.   ?Neurological: Negative.   ?Psychiatric/Behavioral:  Positive for depression and hallucinations. Negative for substance abuse and suicidal ideas. The patient is nervous/anxious and has insomnia.   ?Blood pressure 105/80, pulse 82, temperature 97.7 ?F (36.5 ?C), temperature source Oral, resp. rate 18, height 6\' 1"  (1.854 m), weight 67.6 kg, SpO2 99 %. Body mass index is 19.66 kg/m?. ? ? ?COGNITIVE FEATURES THAT CONTRIBUTE TO RISK:  ?None   ? ?SUICIDE RISK:  ? Mild:  Suicidal ideation of limited frequency, intensity, duration, and specificity.  There are no identifiable plans, no associated intent, mild dysphoria and related symptoms, good self-control (both objective and subjective assessment), few other risk factors, and identifiable protective factors, including available and accessible social support. ? ?PLAN OF CARE: Continue 15-minute checks.  Restart medications.  Engage in individual and group therapy and full treatment team assessment.  Daily reassessment of dangerousness prior to discharge ? ?I certify that inpatient services furnished can reasonably be expected to improve the patient's condition.  ? ?Alethia Berthold, MD ?08/31/2021, 11:24 AM ? ?

## 2021-08-31 NOTE — H&P (Signed)
Psychiatric Admission Assessment Adult  Patient Identification: Nicholas Nielsen MRN:  [id number] Date of Evaluation:  08/31/2021 Chief Complaint:  Bipolar 1 disorder (HCC) [F31.9] Principal Diagnosis: Schizo affective schizophrenia (HCC) Diagnosis:  Principal Problem:   Schizo affective schizophrenia (HCC)  History of Present Illness: Patient seen and chart reviewed.  This is a 41 year old man known to the psychiatric service who came voluntarily to the emergency room because of worsening psychiatric symptoms.  Patient says he got off his meds a couple months ago and was unable to get anyone to refill them.  He started stretching out the little medicine he had left until he was taking his lithium only every few days.  Over the last month or more the voices got more intrusive.  Hearing voices frequently.  Mood unstable.  Feels frightened and angry much of the time.  Tore up a lot of property and destroyed his room at home.  Having some suicidal thoughts but did not act on it.  Patient denies that he has been using any cannabis or any other drugs recently.  He is not sleeping well.  He is requesting to get back on psychiatric medicine. Associated Signs/Symptoms: Depression Symptoms:  depressed mood, insomnia, difficulty concentrating, hopelessness, suicidal thoughts without plan, Duration of Depression Symptoms: Greater than two weeks  (Hypo) Manic Symptoms:  Impulsivity, Labiality of Mood, Anxiety Symptoms:  Excessive Worry, Psychotic Symptoms:  Delusions, Paranoia, PTSD Symptoms: Negative Total Time spent with patient: 1 hour  Past Psychiatric History: Patient has had prior psychiatric treatment specifically had an admission here to our hospital a few months ago.  Took him a long time to gradually get better with mood stabilizers and antipsychotics.  Patient says he is started to recognize that when he takes the medicine he does much better and does much worse when he gets off it.  He does  have a history of suicide attempts.  Used to use a lot of cannabis but is not doing that anymore  Is the patient at risk to self? Yes.    Has the patient been a risk to self in the past 6 months? Yes.    Has the patient been a risk to self within the distant past? Yes.    Is the patient a risk to others? No.  Has the patient been a risk to others in the past 6 months? No.  Has the patient been a risk to others within the distant past? No.   Prior Inpatient Therapy:   Prior Outpatient Therapy:    Alcohol Screening: 1. How often do you have a drink containing alcohol?: Never 2. How many drinks containing alcohol do you have on a typical day when you are drinking?: 1 or 2 3. How often do you have six or more drinks on one occasion?: Never AUDIT-C Score: 0 4. How often during the last year have you found that you were not able to stop drinking once you had started?: Never 5. How often during the last year have you failed to do what was normally expected from you because of drinking?: Never 6. How often during the last year have you needed a first drink in the morning to get yourself going after a heavy drinking session?: Never 7. How often during the last year have you had a feeling of guilt of remorse after drinking?: Never 8. How often during the last year have you been unable to remember what happened the night before because you had been drinking?: Never 9.  Have you or someone else been injured as a result of your drinking?: No 10. Has a relative or friend or a doctor or another health worker been concerned about your drinking or suggested you cut down?: No Alcohol Use Disorder Identification Test Final Score (AUDIT): 0 Substance Abuse History in the last 12 months:  Yes.   Consequences of Substance Abuse: Worsening of psychotic symptoms Previous Psychotropic Medications: Yes  Psychological Evaluations: Yes  Past Medical History:  Past Medical History:  Diagnosis Date   Back pain     Bipolar 1 disorder (HCC)    Hepatitis C    Kidney stones    Schizo affective schizophrenia [facility])    Testicular cyst    History reviewed. No pertinent surgical history. Family History:  Family History  Problem Relation Age of Onset   Stroke Father    Hypertension Father    Family Psychiatric  History: None reported Tobacco Screening:   Social History:  Social History   Substance and Sexual Activity  Alcohol Use Not Currently     Social History   Substance and Sexual Activity  Drug Use Yes   Frequency: 7.0 times per week   Types: Marijuana   Comment: Daily use; last use is 03/26/2019    Additional Social History:                           Allergies:   Allergies  Allergen Reactions   Quetiapine Fumarate Other (See Comments)    Wake up with "Locked jaw" with high doses, 300-400 mg   Latex Rash   Lab Results:  Results for orders placed or performed during the hospital encounter of 08/30/21 (from the past 48 hour(s))  Comprehensive metabolic panel     Status: Abnormal   Collection Time: 08/30/21  3:12 PM  Result Value Ref Range   Sodium 140 135 - 145 mmol/L   Potassium 3.7 3.5 - 5.1 mmol/L   Chloride 105 98 - 111 mmol/L   CO2 25 22 - 32 mmol/L   Glucose, Bld 140 (H) 70 - 99 mg/dL    Comment: Glucose reference range applies only to samples taken after fasting for at least 8 hours.   BUN 14 6 - 20 mg/dL   Creatinine, Ser 0.88 0.61 - 1.24 mg/dL   Calcium 9.3 8.9 - 11.0 mg/dL   Total Protein 7.2 6.5 - 8.1 g/dL   Albumin 4.0 3.5 - 5.0 g/dL   AST 26 15 - 41 U/L   ALT 24 0 - 44 U/L   Alkaline Phosphatase 84 38 - 126 U/L   Total Bilirubin 0.4 0.3 - 1.2 mg/dL   GFR, Estimated >31 >59 mL/min    Comment: (NOTE) Calculated using the CKD-EPI Creatinine Equation (2021)    Anion gap 10 5 - 15    Comment: Performed at [facility], [address] [place], [place] [address]  Ethanol     Status: None   Collection Time: 08/30/21  3:12 PM  Result Value Ref  Range   Alcohol, Ethyl (B) <10 <10 mg/dL    Comment: (NOTE) Lowest detectable limit for serum alcohol is 10 mg/dL.  For medical purposes only. Performed at [facility], [address] [place], [place] [address]   Salicylate level     Status: Abnormal   Collection Time: 08/30/21  3:12 PM  Result Value Ref Range   Salicylate Lvl <7.0 (L) 7.0 - 30.0 mg/dL  Comment: Performed at [facility], [address] [place], [place] [address]  Acetaminophen level     Status: Abnormal   Collection Time: 08/30/21  3:12 PM  Result Value Ref Range   Acetaminophen (Tylenol), Serum <10 (L) 10 - 30 ug/mL    Comment: (NOTE) Therapeutic concentrations vary significantly. A range of 10-30 ug/mL  may be an effective concentration for many patients. However, some  are best treated at concentrations outside of this range. Acetaminophen concentrations >150 ug/mL at 4 hours after ingestion  and >50 ug/mL at 12 hours after ingestion are often associated with  toxic reactions.  Performed at [facility], [address] [place], [place] [address]   cbc     Status: None   Collection Time: 08/30/21  3:12 PM  Result Value Ref Range   WBC 7.8 4.0 - 10.5 K/uL   RBC 4.65 4.22 - 5.81 MIL/uL   Hemoglobin 15.0 13.0 - 17.0 g/dL   HCT 49.7 02.6 - 37.8 %   MCV 96.6 80.0 - 100.0 fL   MCH 32.3 26.0 - 34.0 pg   MCHC 33.4 30.0 - 36.0 g/dL   RDW 58.8 50.2 - 77.4 %   Platelets 186 150 - 400 K/uL   nRBC 0.0 0.0 - 0.2 %    Comment: Performed at [facility], [address], [place], [place] [address]  Resp Panel by RT-PCR (Flu A&B, Covid) Nasopharyngeal Swab     Status: None   Collection Time: 08/30/21  3:12 PM   Specimen: Nasopharyngeal Swab; Nasopharyngeal(NP) swabs in vial transport medium  Result Value Ref Range   SARS Coronavirus 2 by RT PCR NEGATIVE NEGATIVE    Comment: (NOTE) SARS-CoV-2 target nucleic acids are NOT DETECTED.  The SARS-CoV-2 RNA is generally  detectable in upper respiratory specimens during the acute phase of infection. The lowest concentration of SARS-CoV-2 viral copies this assay can detect is 138 copies/mL. A negative result does not preclude SARS-Cov-2 infection and should not be used as the sole basis for treatment or other patient management decisions. A negative result may occur with  improper specimen collection/handling, submission of specimen other than nasopharyngeal swab, presence of viral mutation(s) within the areas targeted by this assay, and inadequate number of viral copies(<138 copies/mL). A negative result must be combined with clinical observations, patient history, and epidemiological information. The expected result is Negative.  Fact Sheet for Patients:  BloggerCourse.com  Fact Sheet for Healthcare Providers:  SeriousBroker.it  This test is no t yet approved or cleared by the [place] FDA and  has been authorized for detection and/or diagnosis of SARS-CoV-2 by FDA under an Emergency Use Authorization (EUA). This EUA will remain  in effect (meaning this test can be used) for the duration of the COVID-19 declaration under Section 564(b)(1) of the Act, 21 U.S.C.section 360bbb-3(b)(1), unless the authorization is terminated  or revoked sooner.       Influenza A by PCR NEGATIVE NEGATIVE   Influenza B by PCR NEGATIVE NEGATIVE    Comment: (NOTE) The Xpert Xpress SARS-CoV-2/FLU/RSV plus assay is intended as an aid in the diagnosis of influenza from Nasopharyngeal swab specimens and should not be used as a sole basis for treatment. Nasal washings and aspirates are unacceptable for Xpert Xpress SARS-CoV-2/FLU/RSV testing.  Fact Sheet for Patients: BloggerCourse.com  Fact Sheet for Healthcare Providers: SeriousBroker.it  This test is not yet approved or cleared by the [place] FDA and has  been authorized for detection and/or diagnosis of  SARS-CoV-2 by FDA under an Emergency Use Authorization (EUA). This EUA will remain in effect (meaning this test can be used) for the duration of the COVID-19 declaration under Section 564(b)(1) of the Act, 21 U.S.C. section 360bbb-3(b)(1), unless the authorization is terminated or revoked.  Performed at [facility], [address] [place], [place] [address]   Lithium level     Status: Abnormal   Collection Time: 08/30/21  3:20 PM  Result Value Ref Range   Lithium Lvl 0.15 (L) 0.60 - 1.20 mmol/L    Comment: Performed at [facility], [address] [place], [place] [address]  Urine Drug Screen, Qualitative     Status: None   Collection Time: 08/30/21  3:51 PM  Result Value Ref Range   Tricyclic, Ur Screen NONE DETECTED NONE DETECTED   Amphetamines, Ur Screen NONE DETECTED NONE DETECTED   MDMA (Ecstasy)Ur Screen NONE DETECTED NONE DETECTED   Cocaine Metabolite,Ur [place] NONE DETECTED NONE DETECTED   Opiate, Ur Screen NONE DETECTED NONE DETECTED   Phencyclidine (PCP) Ur S NONE DETECTED NONE DETECTED   Cannabinoid 50 Ng, Ur [place] NONE DETECTED NONE DETECTED   Barbiturates, Ur Screen NONE DETECTED NONE DETECTED   Benzodiazepine, Ur Scrn NONE DETECTED NONE DETECTED   Methadone Scn, Ur NONE DETECTED NONE DETECTED    Comment: (NOTE) Tricyclics + metabolites, urine    Cutoff 1000 ng/mL Amphetamines + metabolites, urine  Cutoff 1000 ng/mL MDMA (Ecstasy), urine              Cutoff 500 ng/mL Cocaine Metabolite, urine          Cutoff 300 ng/mL Opiate + metabolites, urine        Cutoff 300 ng/mL Phencyclidine (PCP), urine         Cutoff 25 ng/mL Cannabinoid, urine                 Cutoff 50 ng/mL Barbiturates + metabolites, urine  Cutoff 200 ng/mL Benzodiazepine, urine              Cutoff 200 ng/mL Methadone, urine                   Cutoff 300 ng/mL  The urine drug screen provides only a preliminary, unconfirmed analytical  test result and should not be used for non-medical purposes. Clinical consideration and professional judgment should be applied to any positive drug screen result due to possible interfering substances. A more specific alternate chemical method must be used in order to obtain a confirmed analytical result. Gas chromatography / mass spectrometry (GC/MS) is the preferred confirm atory method. Performed at [facility], [address] [place], [place] [address]     Blood Alcohol level:  Lab Results  Component Value Date   [facility] Inc[facility] <10 08/30/2021   ETH <10 03/26/2019    Metabolic Disorder Labs:  Lab Results  Component Value Date   HGBA1C 5.0 05/05/2021   MPG 96.8 05/05/2021   No results found for: PROLACTIN Lab Results  Component Value Date   CHOL 202 (H) 05/05/2021   TRIG 83 05/05/2021   HDL 35 (L) 05/05/2021   CHOLHDL 5.8 05/05/2021   VLDL 17 05/05/2021   LDLCALC 150 (H) 05/05/2021    Current Medications: Current Facility-Administered Medications  Medication Dose Route Frequency Provider Last Rate Last Admin   acetaminophen (TYLENOL) tablet 650 mg  650 mg Oral Q6H PRN Vanetta MuldersBarthold, Louise F, NP       alum & mag hydroxide-simeth (MAALOX/MYLANTA)  200-200-20 MG/5ML suspension 30 mL  30 mL Oral Q4H PRN Gabriel CirriBarthold, Louise F, NP       amLODipine (NORVASC) tablet 5 mg  5 mg Oral BID Gabriel CirriBarthold, Louise F, NP   5 mg at 08/31/21 [id number]   benztropine (COGENTIN) tablet 1 mg  1 mg Oral BID Gabriel CirriBarthold, Louise F, NP   1 mg at 08/31/21 [id number]   hydrOXYzine (ATARAX) tablet 50 mg  50 mg Oral TID PRN Vanetta MuldersBarthold, Louise F, NP       lithium carbonate (LITHOBID) CR tablet 600 mg  600 mg Oral Q12H Vanetta MuldersBarthold, Louise F, NP   600 mg at 08/31/21 [id number]   magnesium hydroxide (MILK OF MAGNESIA) suspension 30 mL  30 mL Oral Daily PRN Gabriel CirriBarthold, Louise F, NP       nicotine (NICODERM CQ - dosed in mg/24 hours) patch 14 mg  14 mg Transdermal Daily Gabriel CirriBarthold, Louise F, NP   14 mg at 08/31/21 [id number]   nicotine polacrilex  (NICORETTE) gum 2 mg  2 mg Oral PRN Vanetta MuldersBarthold, Louise F, NP       pantoprazole (PROTONIX) EC tablet 40 mg  40 mg Oral Daily Everlene Cunning, Jackquline DenmarkJohn T, MD       propranolol (INDERAL) tablet 20 mg  20 mg Oral BID Gabriel CirriBarthold, Louise F, NP   20 mg at 08/31/21 [id number]   risperiDONE (RISPERDAL) tablet 6 mg  6 mg Oral QHS Gabriel CirriBarthold, Louise F, NP   6 mg at 08/30/21 2104   temazepam (RESTORIL) capsule 15 mg  15 mg Oral QHS PRN Vanetta MuldersBarthold, Louise F, NP       PTA Medications: Medications Prior to Admission  Medication Sig Dispense Refill Last Dose   amLODipine (NORVASC) 5 MG tablet Take 1 tablet (5 mg total) by mouth daily. 10 tablet 0    amLODipine (NORVASC) 5 MG tablet Take 1 tablet (5 mg total) by mouth daily. 30 tablet 1    benztropine (COGENTIN) 0.5 MG tablet Take 2 tablets (1 mg total) by mouth 2 (two) times daily. 40 tablet 0    benztropine (COGENTIN) 1 MG tablet Take 1 tablet (1 mg total) by mouth 2 (two) times daily. 60 tablet 1    hydrOXYzine (ATARAX/VISTARIL) 50 MG tablet Take 1 tablet (50 mg total) by mouth every 6 (six) hours as needed for anxiety. 20 tablet 0    hydrOXYzine (ATARAX/VISTARIL) 50 MG tablet Take 1 tablet (50 mg total) by mouth every 6 (six) hours as needed for anxiety. 60 tablet 1    lithium carbonate (LITHOBID) 300 MG CR tablet Take 2 tablets (600 mg total) by mouth every 12 (twelve) hours. 40 tablet 0    lithium carbonate (LITHOBID) 300 MG CR tablet Take 2 tablets (600 mg total) by mouth every 12 (twelve) hours. 120 tablet 1    nicotine (NICODERM CQ - DOSED IN MG/24 HOURS) 14 mg/24hr patch Place 1 patch (14 mg total) onto the skin daily. 14 patch 0    nicotine (NICODERM CQ - DOSED IN MG/24 HOURS) 14 mg/24hr patch Place 1 patch (14 mg total) onto the skin daily. 28 patch 1    propranolol (INDERAL) 20 MG tablet Take 1 tablet (20 mg total) by mouth 2 (two) times daily. 20 tablet 0    propranolol (INDERAL) 20 MG tablet Take 1 tablet (20 mg total) by mouth 2 (two) times daily. 60 tablet 1     risperiDONE (RISPERDAL) 2 MG tablet Take 3 tablets (6 mg total) by mouth 2 (two) times daily. 60  tablet 0    risperiDONE (RISPERDAL) 3 MG tablet Take 2 tablets (6 mg total) by mouth 2 (two) times daily. 120 tablet 1    temazepam (RESTORIL) 15 MG capsule Take 1 capsule (15 mg total) by mouth at bedtime as needed for sleep. 30 capsule 1     Musculoskeletal: Strength & Muscle Tone: within normal limits Gait & Station: normal Patient leans: N/A            Psychiatric Specialty Exam:  Presentation  General Appearance: Appropriate for Environment  Eye Contact:Good  Speech:Clear and Coherent  Speech Volume:Normal  Handedness:Right   Mood and Affect  Mood:Depressed  Affect:Tearful; Congruent   Thought Process  Thought Processes:Coherent  Duration of Psychotic Symptoms: Less than six months  Past Diagnosis of Schizophrenia or Psychoactive disorder: No  Descriptions of Associations:Intact  Orientation:Full (Time, Place and Person)  Thought Content:WDL  Hallucinations:Hallucinations: Auditory  Ideas of Reference:Paranoia; Delusions  Suicidal Thoughts:Suicidal Thoughts: Yes, Passive SI Passive Intent and/or Plan: Without Intent; Without Plan  Homicidal Thoughts:Homicidal Thoughts: No   Sensorium  Memory:Immediate Good  Judgment:Fair  Insight:Good   Executive Functions  Concentration:Fair  Attention Span:Good  Recall:Good  Fund of Knowledge:Good  Language:Good   Psychomotor Activity  Psychomotor Activity:Psychomotor Activity: Normal   Assets  Assets:Desire for Improvement; Manufacturing systems engineer; Financial Resources/Insurance; Housing; Resilience; Physical Health   Sleep  Sleep:Sleep: Poor    Physical Exam: Physical Exam Vitals and nursing note reviewed.  Constitutional:      Appearance: Normal appearance.  HENT:     Head: Normocephalic and atraumatic.     Mouth/Throat:     Pharynx: Oropharynx is clear.  Eyes:     Pupils:  Pupils are equal, round, and reactive to light.  Cardiovascular:     Rate and Rhythm: Normal rate and regular rhythm.  Pulmonary:     Effort: Pulmonary effort is normal.     Breath sounds: Normal breath sounds.  Abdominal:     General: Abdomen is flat.     Palpations: Abdomen is soft.  Musculoskeletal:        General: Normal range of motion.  Skin:    General: Skin is warm and dry.  Neurological:     General: No focal deficit present.     Mental Status: He is alert. Mental status is at baseline.  Psychiatric:        Attention and Perception: He is inattentive.        Mood and Affect: Mood normal. Affect is blunt.        Speech: Speech is delayed.        Behavior: Behavior is slowed.        Thought Content: Thought content is paranoid.        Cognition and Memory: Cognition is impaired.   ROS Blood pressure 105/80, pulse 82, temperature 97.7 F (36.5 C), temperature source Oral, resp. rate 18, height 6\' 1"  (1.854 m), weight 67.6 kg, SpO2 99 %. Body mass index is 19.66 kg/m.  Treatment Plan Summary: Medication management and Plan reviewed medicine.  Continue current pattern of medicine largely lithium and Risperdal and associated medicines.  Orders placed to check hemoglobin A1c and lipid panel and EKG.  Ordered pantoprazole for what sounds like stomach acid complaints.  Patient came to treatment team today and was appropriately interactive.  Involve in individual and group therapy regularly with ongoing evaluations of dangerousness prior to discharge planning  Observation Level/Precautions:  15 minute checks  Laboratory:  UDS  Psychotherapy:  Medications:    Consultations:    Discharge Concerns:    Estimated LOS:  Other:     Physician Treatment Plan for Primary Diagnosis: Schizo affective schizophrenia (HCC) Long Term Goal(s): Improvement in symptoms so as ready for discharge  Short Term Goals: Ability to disclose and discuss suicidal ideas and Ability to demonstrate  self-control will improve  Physician Treatment Plan for Secondary Diagnosis: Principal Problem:   Schizo affective schizophrenia (HCC)  Long Term Goal(s): Improvement in symptoms so as ready for discharge  Short Term Goals: Ability to maintain clinical measurements within normal limits will improve and Compliance with prescribed medications will improve  I certify that inpatient services furnished can reasonably be expected to improve the patient's condition.    Mordecai Rasmussen, MD 3/1/202311:26 AM

## 2021-08-31 NOTE — Plan of Care (Signed)
Patient stated that he slept good last night. Patient calm and cooperative on approach.Patient stated that he is hearing voices but it is getting better now. Patient isolates in his room most of the shift. No aggressive behaviors noted. Denies SI,HI and VH.Appetite and energy level good. Support and encouragement given. ?

## 2021-08-31 NOTE — Progress Notes (Signed)
Pt calm and pleasant during assessment. Pt denies SI/HI/AVH. Pt stated he was feeling better tonight and is glad he is back on his medications. Pt compliant with medication administration per MD orders. Pt being monitored Q 15 minutes for safety per unit protocol. Pt remains safe on the unit.  ?

## 2021-08-31 NOTE — Progress Notes (Signed)
Recreation Therapy Notes ? ?Date: 08/31/2021 ? ?Time: 10:50 am   ? ?Location: Craft room   ? ?Behavioral response: N/A ?  ?Intervention Topic: Coping skills  ? ?Discussion/Intervention: ?Patient refused to attend group.  ? ?Clinical Observations/Feedback:  ?Patient refused to attend group.  ?  ?Nicholas Nielsen LRT/CTRS ? ? ? ? ? ? ? ?Nicholas Nielsen ?08/31/2021 12:16 PM ?

## 2021-09-01 DIAGNOSIS — F259 Schizoaffective disorder, unspecified: Secondary | ICD-10-CM | POA: Diagnosis not present

## 2021-09-01 MED ORDER — PROPRANOLOL HCL 20 MG PO TABS
20.0000 mg | ORAL_TABLET | Freq: Three times a day (TID) | ORAL | Status: DC
  Administered 2021-09-01 – 2021-09-12 (×28): 20 mg via ORAL
  Filled 2021-09-01 (×28): qty 1

## 2021-09-01 NOTE — Group Note (Signed)
[facility] LCSW Group Therapy Note ? ? ?Group Date: 09/01/2021 ?Start Time: 1300 ?End Time: 1400 ? ? ?Type of Therapy/Topic:  Group Therapy:  Balance in Life ? ?Participation Level:  Active  ? ?Description of Group:   ? This group will address the concept of balance and how it feels and looks when one is unbalanced. Patients will be encouraged to process areas in their lives that are out of balance, and identify reasons for remaining unbalanced. Facilitators will guide patients utilizing problem- solving interventions to address and correct the stressor making their life unbalanced. Understanding and applying boundaries will be explored and addressed for obtaining  and maintaining a balanced life. Patients will be encouraged to explore ways to assertively make their unbalanced needs known to significant others in their lives, using other group members and facilitator for support and feedback. ? ?Therapeutic Goals: ?Patient will identify two or more emotions or situations they have that consume much of in their lives. ?Patient will identify signs/triggers that life has become out of balance:  ?Patient will identify two ways to set boundaries in order to achieve balance in their lives:  ?Patient will demonstrate ability to communicate their needs through discussion and/or role plays ? ?Summary of Patient Progress: ?Patient was present for the entirety of the group session. Patient was an active listener and participated in the topic of discussion, provided helpful advice to others, and added nuance to topic of conversation. States that he has difficulty engaging in social situations.   ? ? ? ?Therapeutic Modalities:   ?Cognitive Behavioral Therapy ?Solution-Focused Therapy ?Assertiveness Training ? ? ?Corky Crafts, LCSWA ?

## 2021-09-01 NOTE — Progress Notes (Signed)
[facility] MD Progress Note  09/01/2021 2:10 PM Nicholas Nielsen  MRN:  [id number] Subjective: Follow-up with this patient with schizoaffective disorder.  Patient is still having hallucinations today.  Does not feel much different than previously.  He is complaining of a sensation that he refers to as being seizures.  It does not actually fount sound like real seizures but like an anxiety feeling in his stomach that will sometimes last for long periods of time.  In the past it responded well to clonazepam but he says that at some point the clonazepam seem not to work very well.  He stays mostly to his room which is how he did it before although he is eating adequately. Principal Problem: Schizo affective schizophrenia (HCC) Diagnosis: Principal Problem:   Schizo affective schizophrenia (HCC)  Total Time spent with patient: 30 minutes  Past Psychiatric History: Past history of psychotic disorder with hallucinations  Past Medical History:  Past Medical History:  Diagnosis Date   Back pain    Bipolar 1 disorder (HCC)    Hepatitis C    Kidney stones    Schizo affective schizophrenia [facility])    Testicular cyst    History reviewed. No pertinent surgical history. Family History:  Family History  Problem Relation Age of Onset   Stroke Father    Hypertension Father    Family Psychiatric  History: See previous Social History:  Social History   Substance and Sexual Activity  Alcohol Use Not Currently     Social History   Substance and Sexual Activity  Drug Use Yes   Frequency: 7.0 times per week   Types: Marijuana   Comment: Daily use; last use is 03/26/2019    Social History   Socioeconomic History   Marital status: Single    Spouse name: Not on file   Number of children: Not on file   Years of education: Not on file   Highest education level: Not on file  Occupational History   Occupation: Unemployed  Tobacco Use   Smoking status: Every Day    Packs/day: 1.00    Years: 15.00     Pack years: 15.00    Types: Cigarettes   Smokeless tobacco: Never  Vaping Use   Vaping Use: Never used  Substance and Sexual Activity   Alcohol use: Not Currently   Drug use: Yes    Frequency: 7.0 times per week    Types: Marijuana    Comment: Daily use; last use is 03/26/2019   Sexual activity: Never    Birth control/protection: Condom  Other Topics Concern   Not on file  Social History Narrative   Pt stated that he is currently living with his brother.  He lives in [place] and is unemployed.  Pt stated that he recently arranged an appointment with [facility].   Social Determinants of Health   Financial Resource Strain: Not on file  Food Insecurity: Not on file  Transportation Needs: Not on file  Physical Activity: Not on file  Stress: Not on file  Social Connections: Not on file   Additional Social History:                         Sleep: Fair  Appetite:  Fair  Current Medications: Current Facility-Administered Medications  Medication Dose Route Frequency Provider Last Rate Last Admin   acetaminophen (TYLENOL) tablet 650 mg  650 mg Oral Q6H PRN Vanetta Mulders, NP   650 mg at 09/01/21 [phone]  alum & mag hydroxide-simeth (MAALOX/MYLANTA) 200-200-20 MG/5ML suspension 30 mL  30 mL Oral Q4H PRN Gabriel Cirri F, NP       amLODipine (NORVASC) tablet 5 mg  5 mg Oral BID Gabriel Cirri F, NP   5 mg at 09/01/21 0757   benztropine (COGENTIN) tablet 1 mg  1 mg Oral BID Gabriel Cirri F, NP   1 mg at 09/01/21 0757   hydrOXYzine (ATARAX) tablet 50 mg  50 mg Oral TID PRN Vanetta Mulders, NP       lithium carbonate (LITHOBID) CR tablet 600 mg  600 mg Oral Q12H Gabriel Cirri F, NP   600 mg at 09/01/21 0757   magnesium hydroxide (MILK OF MAGNESIA) suspension 30 mL  30 mL Oral Daily PRN Vanetta Mulders, NP       nicotine (NICODERM CQ - dosed in mg/24 hours) patch 14 mg  14 mg Transdermal Daily Gabriel Cirri F, NP   14 mg at 09/01/21 0757   nicotine polacrilex  (NICORETTE) gum 2 mg  2 mg Oral PRN Vanetta Mulders, NP       pantoprazole (PROTONIX) EC tablet 40 mg  40 mg Oral Daily Felisha Claytor, Jackquline Denmark, MD   40 mg at 09/01/21 0757   propranolol (INDERAL) tablet 20 mg  20 mg Oral TID Josealberto Montalto, Jackquline Denmark, MD       risperiDONE (RISPERDAL) tablet 6 mg  6 mg Oral QHS Vanetta Mulders, NP   6 mg at 08/31/21 2103   temazepam (RESTORIL) capsule 15 mg  15 mg Oral QHS PRN Vanetta Mulders, NP        Lab Results:  Results for orders placed or performed during the hospital encounter of 08/30/21 (from the past 48 hour(s))  Hemoglobin A1c     Status: None   Collection Time: 08/31/21  1:10 PM  Result Value Ref Range   Hgb A1c MFr Bld 5.2 4.8 - 5.6 %    Comment: (NOTE)         Prediabetes: 5.7 - 6.4         Diabetes: >6.4         Glycemic control for adults with diabetes: <7.0    Mean Plasma Glucose 103 mg/dL    Comment: (NOTE) Performed At: [facility] [address] [place], [place] [id number] Jolene Schimke MD QM:[id number]   Lipid panel     Status: Abnormal   Collection Time: 08/31/21  1:10 PM  Result Value Ref Range   Cholesterol 207 (H) 0 - 200 mg/dL   Triglycerides 284 (H) <150 mg/dL   HDL 31 (L) >13 mg/dL   Total CHOL/HDL Ratio 6.7 RATIO   VLDL 53 (H) 0 - 40 mg/dL   LDL Cholesterol 244 (H) 0 - 99 mg/dL    Comment:        Total Cholesterol/HDL:CHD Risk Coronary Heart Disease Risk Table                     Men   Women  1/2 Average Risk   3.4   3.3  Average Risk       5.0   4.4  2 X Average Risk   9.6   7.1  3 X Average Risk  23.4   11.0        Use the calculated Patient Ratio above and the CHD Risk Table to determine the patient's CHD Risk.        ATP III CLASSIFICATION (LDL):  <  100     mg/dL   Optimal  284-132  mg/dL   Near or Above                    Optimal  130-159  mg/dL   Borderline  440-102  mg/dL   High  >725     mg/dL   Very High Performed at [facility], [address] [place], [place] [address]      Blood Alcohol level:  Lab Results  Component Value Date   [facility] <10 08/30/2021   ETH <10 03/26/2019    Metabolic Disorder Labs: Lab Results  Component Value Date   HGBA1C 5.2 08/31/2021   MPG 103 08/31/2021   MPG 96.8 05/05/2021   No results found for: PROLACTIN Lab Results  Component Value Date   CHOL 207 (H) 08/31/2021   TRIG 265 (H) 08/31/2021   HDL 31 (L) 08/31/2021   CHOLHDL 6.7 08/31/2021   VLDL 53 (H) 08/31/2021   LDLCALC 123 (H) 08/31/2021   LDLCALC 150 (H) 05/05/2021    Physical Findings: AIMS:  , ,  ,  ,    CIWA:    COWS:     Musculoskeletal: Strength & Muscle Tone: within normal limits Gait & Station: normal Patient leans: N/A  Psychiatric Specialty Exam:  Presentation  General Appearance: Appropriate for Environment  Eye Contact:Good  Speech:Clear and Coherent  Speech Volume:Normal  Handedness:Right   Mood and Affect  Mood:Depressed  Affect:Tearful; Congruent   Thought Process  Thought Processes:Coherent  Descriptions of Associations:Intact  Orientation:Full (Time, Place and Person)  Thought Content:WDL  History of Schizophrenia/Schizoaffective disorder:No  Duration of Psychotic Symptoms:Less than six months  Hallucinations:No data recorded Ideas of Reference:Paranoia; Delusions  Suicidal Thoughts:No data recorded Homicidal Thoughts:No data recorded  Sensorium  Memory:Immediate Good  Judgment:Fair  Insight:Good   Executive Functions  Concentration:Fair  Attention Span:Good  Recall:Good  Fund of Knowledge:Good  Language:Good   Psychomotor Activity  Psychomotor Activity:No data recorded  Assets  Assets:Desire for Improvement; Manufacturing systems engineer; Financial Resources/Insurance; Housing; Resilience; Physical Health   Sleep  Sleep:No data recorded   Physical Exam: Physical Exam Vitals and nursing note reviewed.  Constitutional:      Appearance: Normal appearance.  HENT:     Head: Normocephalic  and atraumatic.     Mouth/Throat:     Pharynx: Oropharynx is clear.  Eyes:     Pupils: Pupils are equal, round, and reactive to light.  Cardiovascular:     Rate and Rhythm: Normal rate and regular rhythm.  Pulmonary:     Effort: Pulmonary effort is normal.     Breath sounds: Normal breath sounds.  Abdominal:     General: Abdomen is flat.     Palpations: Abdomen is soft.  Musculoskeletal:        General: Normal range of motion.  Skin:    General: Skin is warm and dry.  Neurological:     General: No focal deficit present.     Mental Status: He is alert. Mental status is at baseline.  Psychiatric:        Attention and Perception: He is inattentive. He perceives auditory hallucinations.        Mood and Affect: Mood normal. Affect is blunt.        Speech: Speech is delayed.        Behavior: Behavior is slowed.        Thought Content: Thought content is paranoid.        Cognition  and Memory: Cognition is impaired.   Review of Systems  Constitutional: Negative.   HENT: Negative.    Eyes: Negative.   Respiratory: Negative.    Cardiovascular: Negative.   Gastrointestinal: Negative.   Musculoskeletal: Negative.   Skin: Negative.   Neurological: Negative.   Psychiatric/Behavioral:  Positive for depression and hallucinations. The patient is nervous/anxious.   Blood pressure 114/75, pulse 94, temperature 97.7 F (36.5 C), temperature source Oral, resp. rate 17, height 6\' 1"  (1.854 m), weight 67.6 kg, SpO2 99 %. Body mass index is 19.66 kg/m.   Treatment Plan Summary: Medication management and Plan I thought about restarting the clonazepam but he is fixed on the idea that maybe it could somehow make things worse.  I think this is likely to be true but we will focus instead on the propranolol which I think is also being used as needed or at least when needed as anxiety and we will increase the dose of that to 3 times a day.  No change to other medicine.  Encouraged patient to be active  as much as possible  Mordecai Rasmussen, MD 09/01/2021, 2:10 PM

## 2021-09-01 NOTE — Progress Notes (Signed)
D: Pt alert and oriented. Pt rates depression 6/10 and anxiety 7/10.  Pt reports sleeping last night as being good. Pt denies experiencing any pain  SI/HI, or AVH at this time. Patient present for all meals and participating in groups.  ? ?A: Scheduled medications administered to pt, per MD orders. Support and encouragement provided. Frequent verbal contact made. Routine safety checks conducted q15 minutes.  ? ?R: No adverse drug reactions noted. Pt verbally contracts for safety at this time. Pt complaint with medications and treatment plan. Pt interacts well with others on the unit. Pt remains safe at this time. Will continue to monitor.     ?

## 2021-09-01 NOTE — Plan of Care (Signed)
  Problem: Education: Goal: Knowledge of [facility] Education information/materials will improve Outcome: Progressing Goal: Emotional status will improve Outcome: Progressing Goal: Mental status will improve Outcome: Progressing Goal: Verbalization of understanding the information provided will improve Outcome: Progressing   Problem: Safety: Goal: Periods of time without injury will increase Outcome: Progressing   Problem: Education: Goal: Will be free of psychotic symptoms Outcome: Progressing Goal: Knowledge of the prescribed therapeutic regimen will improve Outcome: Progressing   Problem: Safety: Goal: Ability to redirect hostility and anger into socially appropriate behaviors will improve Outcome: Progressing Goal: Ability to remain free from injury will improve Outcome: Progressing   

## 2021-09-01 NOTE — Progress Notes (Signed)
Pt calm and pleasant during assessment. Pt denies SI/HI/AVH. Pt stated he was feeling better tonight and is glad he is back on his medications. Pt compliant with medication administration per MD orders. Pt being monitored Q 15 minutes for safety per unit protocol. Pt remains safe on the unit.  ?

## 2021-09-01 NOTE — Progress Notes (Signed)
Recreation Therapy Notes ? ?Date: 09/01/2021 ?  ?Time: 10:25 am   ?  ?Location: Craft room   ?  ?Behavioral response: N/A ?  ?Intervention Topic: Relaxation  ?  ?Discussion/Intervention: ?Patient refused to attend group.  ?  ?Clinical Observations/Feedback:  ?Patient refused to attend group.  ?  ?[name] LRT/CTRS ? ? ? ? ? ? ? ?Nicholas Nielsen ?09/01/2021 12:47 PM ?

## 2021-09-02 DIAGNOSIS — F259 Schizoaffective disorder, unspecified: Secondary | ICD-10-CM | POA: Diagnosis not present

## 2021-09-02 NOTE — Progress Notes (Signed)
Recreation Therapy Notes ? ?INPATIENT RECREATION THERAPY ASSESSMENT ? ?Patient Details ?Name: Nicholas Nielsen ?MRN: [id number] ?DOB: Apr 28, 1981 ?Today's Date: 09/02/2021 ?      ?Information Obtained From: ?Patient ? ?Able to Participate in Assessment/Interview: ?Yes ? ?Patient Presentation: ?Responsive ? ?Reason for Admission (Per Patient): ?Active Symptoms, Med Non-Compliance ? ?Patient Stressors: ?  ? ?Coping Skills:   ?Isolation, Avoidance ? ?Leisure Interests (2+):  ? (None) ? ?Frequency of Recreation/Participation: ?Monthly ? ?Awareness of Community Resources:  ?  ? ?Community Resources:  ?  ? ?Current Use: ?  ? ?If no, Barriers?: ?  ? ?Expressed Interest in [employer] Information: ?  ? ?[place] of Residence:  ?Roda Shutters ? ?Patient Main Form of Transportation: ?Therapist, music ? ?Patient Strengths:  ?N/A ? ?Patient Identified Areas of Improvement:  ?N/A ? ?Patient Goal for Hospitalization:  ?Get clear ? ?Current SI (including self-harm):  ?No ? ?Current HI:  ?No ? ?Current AVH: ?No ? ?Staff Intervention Plan: ?Group Attendance, Collaborate with Interdisciplinary Treatment Team ? ?Consent to Intern Participation: ?N/A ? ?Nicholas Nielsen ?09/02/2021, 3:46 PM ?

## 2021-09-02 NOTE — Progress Notes (Signed)
D: Pt alert and awake this shift. Present off and on the milieu. Denies SI/HI/AVH, anxiety, pain ? ?A: Labs and vital signs monitored. Patient supported emotionally and encouraged to verbalize concerns.  Pt given medication per protocol and standing orders.   ? ?R: Q5m safety checks implemented and continued.Pt safe on the unit. Will continue to monitor.  ?

## 2021-09-02 NOTE — Progress Notes (Signed)
Recreation Therapy Notes ? ?Date: 09/02/2021 ? ?Time: 10:30 am   ? ?Location: Craft room   ? ?Behavioral response: N/A ?  ?Intervention Topic: Communication   ? ?Discussion/Intervention: ?Patient refused to attend group.  ? ?Clinical Observations/Feedback:  ?Patient refused to attend group.  ?  ?Calen Posch LRT/CTRS ? ? ? ? ? ? ? ?Tiras Bianchini ?09/02/2021 12:24 PM ?

## 2021-09-02 NOTE — Plan of Care (Signed)
?  Problem: Education: ?Goal: Knowledge of [facility] Education information/materials will improve ?Outcome: Progressing ?Goal: Emotional status will improve ?Outcome: Progressing ?Goal: Mental status will improve ?Outcome: Progressing ?Goal: Verbalization of understanding the information provided will improve ?Outcome: Progressing ?  ?Problem: Safety: ?Goal: Periods of time without injury will increase ?Outcome: Progressing ?  ?Problem: Education: ?Goal: Will be free of psychotic symptoms ?Outcome: Progressing ?Goal: Knowledge of the prescribed therapeutic regimen will improve ?Outcome: Progressing ?  ?Problem: Safety: ?Goal: Ability to redirect hostility and anger into socially appropriate behaviors will improve ?Outcome: Progressing ?Goal: Ability to remain free from injury will improve ?Outcome: Progressing ?  ?Problem: Safety: ?Goal: Ability to redirect hostility and anger into socially appropriate behaviors will improve ?Outcome: Progressing ?Goal: Ability to remain free from injury will improve ?Outcome: Progressing ?  ?

## 2021-09-02 NOTE — Group Note (Signed)
[facility] LCSW Group Therapy Note ? ? ?Group Date: 09/02/2021 ?Start Time: 1300 ?End Time: 1400 ? ?Type of Therapy and Topic:  Group Therapy:  Feelings around Relapse and Recovery ? ?Participation Level:  Did Not Attend  ? ? ?Description of Group:   ? Patients in this group will discuss emotions they experience before and after a relapse. They will process how experiencing these feelings, or avoidance of experiencing them, relates to having a relapse. Facilitator will guide patients to explore emotions they have related to recovery. Patients will be encouraged to process which emotions are more powerful. They will be guided to discuss the emotional reaction significant others in their lives may have to patients? relapse or recovery. Patients will be assisted in exploring ways to respond to the emotions of others without this contributing to a relapse. ? ?Therapeutic Goals: ?Patient will identify two or more emotions that lead to relapse for them:  ?Patient will identify two emotions that result when they relapse:  ?Patient will identify two emotions related to recovery:  ?Patient will demonstrate ability to communicate their needs through discussion and/or role plays. ? ? ?Summary of Patient Progress: ?X ? ? ?Therapeutic Modalities:   ?Cognitive Behavioral Therapy ?Solution-Focused Therapy ?Assertiveness Training ?Relapse Prevention Therapy ? ? ?Kielan Dreisbach R Anatasia Tino, LCSW ?

## 2021-09-02 NOTE — Progress Notes (Signed)
Recreation Therapy Notes ? ?INPATIENT RECREATION TR PLAN ? ?Patient Details ?Name: Nicholas Nielsen ?MRN: [id number] ?DOB: March 14, 1981 ?Today's Date: 09/02/2021 ? ?Rec Therapy Plan ?Is patient appropriate for Therapeutic Recreation?: Yes ?Treatment times per week: at least 3 ?Estimated Length of Stay: 5-7 days ?TR Treatment/Interventions: Group participation (Comment) ? ?Discharge Criteria ?Pt will be discharged from therapy if:: Discharged ?Treatment plan/goals/alternatives discussed and agreed upon by:: Patient/family ? ?Discharge Summary ?  ? ? ?Anatalia Kronk ?09/02/2021, 3:45 PM ?

## 2021-09-02 NOTE — Progress Notes (Signed)
[facility] MD Progress Note ? ?09/02/2021 4:11 PM ?Nicholas Nielsen  ?MRN:  [id number] ?Subjective: Follow-up for this patient with schizoaffective disorder.  Patient continues to endorse auditory hallucinations.  Mood dysphoric and blunted.  Denies acute suicidal ideation.  Nicholas Nielsen rarely comes out of his room but is pleasant when I go to see him.  Pretty much like Nicholas Nielsen was last time.  No medicine side effects complained of. ?Principal Problem: Schizo affective schizophrenia (HCC) ?Diagnosis: Principal Problem: ?  Schizo affective schizophrenia (HCC) ? ?Total Time spent with patient: 30 minutes ? ?Past Psychiatric History: Past history of recurrent episodes of psychosis when off medicine ? ?Past Medical History:  ?Past Medical History:  ?Diagnosis Date  ? Back pain   ? Bipolar 1 disorder (HCC)   ? Hepatitis C   ? Kidney stones   ? Schizo affective schizophrenia (HCC)   ? Testicular cyst   ? History reviewed. No pertinent surgical history. ?Family History:  ?Family History  ?Problem Relation Age of Onset  ? Stroke Father   ? Hypertension Father   ? ?Family Psychiatric  History: See previous ?Social History:  ?Social History  ? ?Substance and Sexual Activity  ?Alcohol Use Not Currently  ?   ?Social History  ? ?Substance and Sexual Activity  ?Drug Use Yes  ? Frequency: 7.0 times per week  ? Types: Marijuana  ? Comment: Daily use; last use is 03/26/2019  ?  ?Social History  ? ?Socioeconomic History  ? Marital status: Single  ?  Spouse name: Not on file  ? Number of children: Not on file  ? Years of education: Not on file  ? Highest education level: Not on file  ?Occupational History  ? Occupation: Unemployed  ?Tobacco Use  ? Smoking status: Every Day  ?  Packs/day: 1.00  ?  Years: 15.00  ?  Pack years: 15.00  ?  Types: Cigarettes  ? Smokeless tobacco: Never  ?Vaping Use  ? Vaping Use: Never used  ?Substance and Sexual Activity  ? Alcohol use: Not Currently  ? Drug use: Yes  ?  Frequency: 7.0 times per week  ?  Types: Marijuana  ?   Comment: Daily use; last use is 03/26/2019  ? Sexual activity: Never  ?  Birth control/protection: Condom  ?Other Topics Concern  ? Not on file  ?Social History Narrative  ? Pt stated that Nicholas Nielsen is currently living with his brother.  Nicholas Nielsen lives in [place] and is unemployed.  Pt stated that Nicholas Nielsen recently arranged an appointment with [facility].  ? ?Social Determinants of Health  ? ?Financial Resource Strain: Not on file  ?Food Insecurity: Not on file  ?Transportation Needs: Not on file  ?Physical Activity: Not on file  ?Stress: Not on file  ?Social Connections: Not on file  ? ?Additional Social History:  ?  ?  ?  ?  ?  ?  ?  ?  ?  ?  ?  ? ?Sleep: Fair ? ?Appetite:  Fair ? ?Current Medications: ?Current Facility-Administered Medications  ?Medication Dose Route Frequency Provider Last Rate Last Admin  ? acetaminophen (TYLENOL) tablet 650 mg  650 mg Oral Q6H PRN Vanetta Mulders, NP   650 mg at 09/01/21 0758  ? alum & mag hydroxide-simeth (MAALOX/MYLANTA) 200-200-20 MG/5ML suspension 30 mL  30 mL Oral Q4H PRN Gabriel Cirri F, NP      ? amLODipine (NORVASC) tablet 5 mg  5 mg Oral BID Gabriel Cirri F, NP   5 mg at  09/02/21 0853  ? benztropine (COGENTIN) tablet 1 mg  1 mg Oral BID Gabriel Cirri F, NP   1 mg at 09/02/21 8182  ? hydrOXYzine (ATARAX) tablet 50 mg  50 mg Oral TID PRN Vanetta Mulders, NP      ? lithium carbonate (LITHOBID) CR tablet 600 mg  600 mg Oral Q12H Gabriel Cirri F, NP   600 mg at 09/02/21 9937  ? magnesium hydroxide (MILK OF MAGNESIA) suspension 30 mL  30 mL Oral Daily PRN Gabriel Cirri F, NP      ? nicotine (NICODERM CQ - dosed in mg/24 hours) patch 14 mg  14 mg Transdermal Daily Gabriel Cirri F, NP   14 mg at 09/02/21 1696  ? nicotine polacrilex (NICORETTE) gum 2 mg  2 mg Oral PRN Vanetta Mulders, NP      ? pantoprazole (PROTONIX) EC tablet 40 mg  40 mg Oral Daily Jaymir Struble, Jackquline Denmark, MD   40 mg at 09/02/21 7893  ? propranolol (INDERAL) tablet 20 mg  20 mg Oral TID [place] Huntsman, Jackquline Denmark, MD    20 mg at 09/02/21 1208  ? risperiDONE (RISPERDAL) tablet 6 mg  6 mg Oral QHS Gabriel Cirri F, NP   6 mg at 09/01/21 2107  ? temazepam (RESTORIL) capsule 15 mg  15 mg Oral QHS PRN Vanetta Mulders, NP      ? ? ?Lab Results: No results found for this or any previous visit (from the past 48 hour(s)). ? ?Blood Alcohol level:  ?Lab Results  ?Component Value Date  ? ETH <10 08/30/2021  ? ETH <10 03/26/2019  ? ? ?Metabolic Disorder Labs: ?Lab Results  ?Component Value Date  ? HGBA1C 5.2 08/31/2021  ? MPG 103 08/31/2021  ? MPG 96.8 05/05/2021  ? ?No results found for: PROLACTIN ?Lab Results  ?Component Value Date  ? CHOL 207 (H) 08/31/2021  ? TRIG 265 (H) 08/31/2021  ? HDL 31 (L) 08/31/2021  ? CHOLHDL 6.7 08/31/2021  ? VLDL 53 (H) 08/31/2021  ? LDLCALC 123 (H) 08/31/2021  ? LDLCALC 150 (H) 05/05/2021  ? ? ?Physical Findings: ?AIMS:  , ,  ,  ,    ?CIWA:    ?COWS:    ? ?Musculoskeletal: ?Strength & Muscle Tone: within normal limits ?Gait & Station: normal ?Patient leans: N/A ? ?Psychiatric Specialty Exam: ? ?Presentation  ?General Appearance: Appropriate for Environment ? ?Eye Contact:Good ? ?Speech:Clear and Coherent ? ?Speech Volume:Normal ? ?Handedness:Right ? ? ?Mood and Affect  ?Mood:Depressed ? ?Affect:Tearful; Congruent ? ? ?Thought Process  ?Thought Processes:Coherent ? ?Descriptions of Associations:Intact ? ?Orientation:Full (Time, Place and Person) ? ?Thought Content:WDL ? ?History of Schizophrenia/Schizoaffective disorder:No ? ?Duration of Psychotic Symptoms:Less than six months ? ?Hallucinations:No data recorded ?Ideas of Reference:Paranoia; Delusions ? ?Suicidal Thoughts:No data recorded ?Homicidal Thoughts:No data recorded ? ?Sensorium  ?Memory:Immediate Good ? ?Judgment:Fair ? ?Insight:Good ? ? ?Executive Functions  ?Concentration:Fair ? ?Attention Span:Good ? ?Recall:Good ? ?Fund of Knowledge:Good ? ?Language:Good ? ? ?Psychomotor Activity  ?Psychomotor Activity:No data recorded ? ?Assets  ?Assets:Desire  for Improvement; Communication Skills; Financial Resources/Insurance; Housing; Resilience; Physical Health ? ? ?Sleep  ?Sleep:No data recorded ? ? ?Physical Exam: ?Physical Exam ?Vitals and nursing note reviewed.  ?Constitutional:   ?   Appearance: Normal appearance.  ?HENT:  ?   Head: Normocephalic and atraumatic.  ?   Mouth/Throat:  ?   Pharynx: Oropharynx is clear.  ?Eyes:  ?   Pupils: Pupils are equal, round, and reactive to light.  ?Cardiovascular:  ?  Rate and Rhythm: Normal rate and regular rhythm.  ?Pulmonary:  ?   Effort: Pulmonary effort is normal.  ?   Breath sounds: Normal breath sounds.  ?Abdominal:  ?   General: Abdomen is flat.  ?   Palpations: Abdomen is soft.  ?Musculoskeletal:     ?   General: Normal range of motion.  ?Skin: ?   General: Skin is warm and dry.  ?Neurological:  ?   General: No focal deficit present.  ?   Mental Status: Nicholas Nielsen is alert. Mental status is at baseline.  ?Psychiatric:     ?   Attention and Perception: Nicholas Nielsen perceives auditory hallucinations.     ?   Mood and Affect: Mood normal. Affect is blunt.     ?   Speech: Speech is delayed.     ?   Behavior: Behavior is slowed.     ?   Thought Content: Thought content normal.  ? ?Review of Systems  ?Constitutional: Negative.   ?HENT: Negative.    ?Eyes: Negative.   ?Respiratory: Negative.    ?Cardiovascular: Negative.   ?Gastrointestinal: Negative.   ?Musculoskeletal: Negative.   ?Skin: Negative.   ?Neurological: Negative.   ?Psychiatric/Behavioral:  Positive for hallucinations.   ?Blood pressure 109/83, pulse 77, temperature 97.6 ?F (36.4 ?C), temperature source Oral, resp. rate 18, height 6\' 1"  (1.854 m), weight 67.6 kg, SpO2 98 %. Body mass index is 19.66 kg/m?. ? ? ?Treatment Plan Summary: ?Medication management and Plan continue medication.  Check lithium level Monday.  Encourage patient to try to come out and attend groups as much as possible.  Consistent with his last admission and will probably take some time for recovery. ? ?Mordecai Rasmussen, MD ?09/02/2021, 4:11 PM ? ?

## 2021-09-03 DIAGNOSIS — F259 Schizoaffective disorder, unspecified: Secondary | ICD-10-CM | POA: Diagnosis not present

## 2021-09-03 NOTE — Group Note (Signed)
LCSW Group Therapy Note ? ?Group Date: 09/03/2021 ?Start Time: 1300 ?End Time: 1400 ? ? ?Type of Therapy and Topic:  Group Therapy - Healthy vs Unhealthy Coping Skills ? ?Participation Level:  Did Not Attend  ? ?Description of Group ?The focus of this group was to determine what unhealthy coping techniques typically are used by group members and what healthy coping techniques would be helpful in coping with various problems. Patients were guided in becoming aware of the differences between healthy and unhealthy coping techniques. Patients were asked to identify 2-3 healthy coping skills they would like to learn to use more effectively. ? ?Therapeutic Goals ?Patients learned that coping is what human beings do all day long to deal with various situations in their lives ?Patients defined and discussed healthy vs unhealthy coping techniques ?Patients identified their preferred coping techniques and identified whether these were healthy or unhealthy ?Patients determined 2-3 healthy coping skills they would like to become more familiar with and use more often. ?Patients provided support and ideas to each other ? ? ?Summary of Patient Progress: Patient did not attend group despite encouraged participation. ? ? ? ?Therapeutic Modalities ?Cognitive Behavioral Therapy ?Motivational Interviewing ? ?Ileana Ladd [place], LCSWA ?09/03/2021  2:41 PM   ?

## 2021-09-03 NOTE — Progress Notes (Signed)
[facility] MD Progress Note ? ?09/03/2021 12:18 PM ?Nicholas Nielsen  ?MRN:  [id number] ?Subjective: Nicholas Nielsen is seen and examined today.  He does not have any complaints.  He states he is doing fine on his medications.  He denies any side effects. ? ?Principal Problem: Schizo affective schizophrenia ([place]) ?Diagnosis: Principal Problem: ?  Schizo affective schizophrenia ([place]) ? ?Total Time spent with patient: 15 minutes ? ? ?Past Medical History:  ?Past Medical History:  ?Diagnosis Date  ? Back pain   ? Bipolar 1 disorder ([place])   ? Hepatitis C   ? Kidney stones   ? Schizo affective schizophrenia ([place])   ? Testicular cyst   ? History reviewed. No pertinent surgical history. ?Family History:  ?Family History  ?Problem Relation Age of Onset  ? Stroke Father   ? Hypertension Father   ? ? ?Social History:  ?Social History  ? ?Substance and Sexual Activity  ?Alcohol Use Not Currently  ?   ?Social History  ? ?Substance and Sexual Activity  ?Drug Use Yes  ? Frequency: 7.0 times per week  ? Types: Marijuana  ? Comment: Daily use; last use is 03/26/2019  ?  ?Social History  ? ?Socioeconomic History  ? Marital status: Single  ?  Spouse name: Not on file  ? Number of children: Not on file  ? Years of education: Not on file  ? Highest education level: Not on file  ?Occupational History  ? Occupation: Unemployed  ?Tobacco Use  ? Smoking status: Every Day  ?  Packs/day: 1.00  ?  Years: 15.00  ?  Pack years: 15.00  ?  Types: Cigarettes  ? Smokeless tobacco: Never  ?Vaping Use  ? Vaping Use: Never used  ?Substance and Sexual Activity  ? Alcohol use: Not Currently  ? Drug use: Yes  ?  Frequency: 7.0 times per week  ?  Types: Marijuana  ?  Comment: Daily use; last use is 03/26/2019  ? Sexual activity: Never  ?  Birth control/protection: Condom  ?Other Topics Concern  ? Not on file  ?Social History Narrative  ? Pt stated that he is currently living with his brother.  He lives in [place] and is unemployed.  Pt stated that he recently arranged an  appointment with [facility].  ? ?Social Determinants of Health  ? ?Financial Resource Strain: Not on file  ?Food Insecurity: Not on file  ?Transportation Needs: Not on file  ?Physical Activity: Not on file  ?Stress: Not on file  ?Social Connections: Not on file  ? ?Additional Social History:  ?  ?  ?  ?  ?  ?  ?  ?  ?  ?  ?  ? ?Sleep: Good ? ?Appetite:  Good ? ?Current Medications: ?Current Facility-Administered Medications  ?Medication Dose Route Frequency Provider Last Rate Last Admin  ? acetaminophen (TYLENOL) tablet 650 mg  650 mg Oral Q6H PRN Sherlon Handing, NP   650 mg at 09/01/21 0758  ? alum & mag hydroxide-simeth (MAALOX/MYLANTA) 200-200-20 MG/5ML suspension 30 mL  30 mL Oral Q4H PRN Waldon Merl F, NP      ? amLODipine (NORVASC) tablet 5 mg  5 mg Oral BID Waldon Merl F, NP   5 mg at 09/03/21 0802  ? benztropine (COGENTIN) tablet 1 mg  1 mg Oral BID Waldon Merl F, NP   1 mg at 09/03/21 [id number]  ? hydrOXYzine (ATARAX) tablet 50 mg  50 mg Oral TID PRN Sherlon Handing, NP  50 mg at 09/03/21 0804  ? lithium carbonate (LITHOBID) CR tablet 600 mg  600 mg Oral Q12H Waldon Merl F, NP   600 mg at 09/03/21 [id number]  ? magnesium hydroxide (MILK OF MAGNESIA) suspension 30 mL  30 mL Oral Daily PRN Waldon Merl F, NP      ? nicotine (NICODERM CQ - dosed in mg/24 hours) patch 14 mg  14 mg Transdermal Daily Waldon Merl F, NP   14 mg at 09/03/21 [id number]  ? nicotine polacrilex (NICORETTE) gum 2 mg  2 mg Oral PRN Sherlon Handing, NP      ? pantoprazole (PROTONIX) EC tablet 40 mg  40 mg Oral Daily Clapacs, Madie Reno, MD   40 mg at 09/03/21 0801  ? propranolol (INDERAL) tablet 20 mg  20 mg Oral TID Clapacs, Madie Reno, MD   20 mg at 09/03/21 1218  ? risperiDONE (RISPERDAL) tablet 6 mg  6 mg Oral QHS Waldon Merl F, NP   6 mg at 09/02/21 2054  ? temazepam (RESTORIL) capsule 15 mg  15 mg Oral QHS PRN Sherlon Handing, NP   15 mg at 09/02/21 2054  ? ? ?Lab Results: No results found for this or any previous  visit (from the past 48 hour(s)). ? ?Blood Alcohol level:  ?Lab Results  ?Component Value Date  ? ETH <10 08/30/2021  ? ETH <10 03/26/2019  ? ? ?Metabolic Disorder Labs: ?Lab Results  ?Component Value Date  ? HGBA1C 5.2 08/31/2021  ? MPG 103 08/31/2021  ? MPG 96.8 05/05/2021  ? ?No results found for: PROLACTIN ?Lab Results  ?Component Value Date  ? CHOL 207 (H) 08/31/2021  ? TRIG 265 (H) 08/31/2021  ? HDL 31 (L) 08/31/2021  ? CHOLHDL 6.7 08/31/2021  ? VLDL 53 (H) 08/31/2021  ? LDLCALC 123 (H) 08/31/2021  ? [place] 150 (H) 05/05/2021  ? ? ?Physical Findings: ?AIMS:  , ,  ,  ,    ?CIWA:    ?COWS:    ? ?Musculoskeletal: ?Strength & Muscle Tone: within normal limits ?Gait & Station: normal ?Patient leans: N/A ? ?Psychiatric Specialty Exam: ? ?Presentation  ?General Appearance: Appropriate for Environment ? ?Eye Contact:Good ? ?Speech:Clear and Coherent ? ?Speech Volume:Normal ? ?Handedness:Right ? ? ?Mood and Affect  ?Mood:Depressed ? ?Affect:Tearful; Congruent ? ? ?Thought Process  ?Thought Processes:Coherent ? ?Descriptions of Associations:Intact ? ?Orientation:Full (Time, Place and Person) ? ?Thought Content:WDL ? ?History of Schizophrenia/Schizoaffective disorder:No ? ?Duration of Psychotic Symptoms:Less than six months ? ?Hallucinations:No data recorded ?Ideas of Reference:Paranoia; Delusions ? ?Suicidal Thoughts:No data recorded ?Homicidal Thoughts:No data recorded ? ?Sensorium  ?Memory:Immediate Good ? ?Judgment:Fair ? ?Insight:Good ? ? ?Executive Functions  ?Concentration:Fair ? ?Attention Span:Good ? ?Recall:Good ? ?Fund of [place] ? ?Language:Good ? ? ?Psychomotor Activity  ?Psychomotor Activity:No data recorded ? ?Assets  ?Assets:Desire for Improvement; Communication Skills; Financial Resources/Insurance; Housing; Resilience; Physical Health ? ? ?Sleep  ?Sleep:No data recorded ? ? ?Physical Exam: ?Physical Exam ?Vitals and nursing note reviewed.  ?Constitutional:   ?   Appearance: Normal appearance. He  is normal weight.  ?Neurological:  ?   General: No focal deficit present.  ?   Mental Status: He is alert and oriented to person, place, and time.  ?Psychiatric:     ?   Mood and Affect: Mood normal.     ?   Behavior: Behavior normal.  ? ?Review of Systems  ?Constitutional: Negative.   ?HENT: Negative.    ?Eyes: Negative.   ?Respiratory: Negative.    ?  Cardiovascular: Negative.   ?Gastrointestinal: Negative.   ?Genitourinary: Negative.   ?Musculoskeletal: Negative.   ?Skin: Negative.   ?Neurological: Negative.   ?Endo/Heme/Allergies: Negative.   ?Psychiatric/Behavioral: Negative.    ?Blood pressure (!) 117/92, pulse 87, temperature 98.6 ?F (37 ?C), temperature source Oral, resp. rate 17, height 6\' 1"  (1.854 m), weight 67.6 kg, SpO2 98 %. Body mass index is 19.66 kg/m?. ? ? ?Treatment Plan Summary: ?Daily contact with patient to assess and evaluate symptoms and progress in treatment, Medication management, and Plan continue current medications. ? ?Parks Ranger, DO ?09/03/2021, 12:18 PM ? ?

## 2021-09-03 NOTE — Plan of Care (Signed)
  Problem: Education: Goal: Knowledge of [facility] Education information/materials will improve Outcome: Progressing Goal: Emotional status will improve Outcome: Progressing Goal: Mental status will improve Outcome: Progressing Goal: Verbalization of understanding the information provided will improve Outcome: Progressing   Problem: Safety: Goal: Periods of time without injury will increase Outcome: Progressing   Problem: Education: Goal: Will be free of psychotic symptoms Outcome: Progressing Goal: Knowledge of the prescribed therapeutic regimen will improve Outcome: Progressing   Problem: Safety: Goal: Ability to redirect hostility and anger into socially appropriate behaviors will improve Outcome: Progressing Goal: Ability to remain free from injury will improve Outcome: Progressing   

## 2021-09-03 NOTE — Progress Notes (Addendum)
D: Pt alert and oriented this shift. Calm and cooperative. Isolates most of the shift except for meals. Denies SI/HI/VH. When asked about the auditory hallucinations, patient states, "I'm not hearing them right now. " Rates anxiety  8/10 and depression 9/10. Prn medication given for anxiety.  ? ?A: Pt provided support and encouragement. Pt given medication per protocol and standing orders. Q50m safety checks implemented and continued. ? ?  ?R: Pt safe on the unit. Will continue to monitor.  ?

## 2021-09-03 NOTE — Plan of Care (Signed)
?  Problem: Education: ?Goal: Knowledge of [facility] Education information/materials will improve ?09/03/2021 1826 by Angeline Slim, RN ?Outcome: Progressing ?09/03/2021 1617 by Angeline Slim, RN ?Outcome: Progressing ?Goal: Emotional status will improve ?09/03/2021 1826 by Angeline Slim, RN ?Outcome: Progressing ?09/03/2021 1617 by Angeline Slim, RN ?Outcome: Progressing ?Goal: Mental status will improve ?09/03/2021 1826 by Angeline Slim, RN ?Outcome: Progressing ?09/03/2021 1617 by Angeline Slim, RN ?Outcome: Progressing ?Goal: Verbalization of understanding the information provided will improve ?09/03/2021 1826 by Angeline Slim, RN ?Outcome: Progressing ?09/03/2021 1617 by Angeline Slim, RN ?Outcome: Progressing ?  ?Problem: Safety: ?Goal: Periods of time without injury will increase ?09/03/2021 1826 by Angeline Slim, RN ?Outcome: Progressing ?09/03/2021 1617 by Angeline Slim, RN ?Outcome: Progressing ?  ?Problem: Education: ?Goal: Will be free of psychotic symptoms ?09/03/2021 1826 by Angeline Slim, RN ?Outcome: Not Progressing ?09/03/2021 1617 by Angeline Slim, RN ?Outcome: Progressing ?Goal: Knowledge of the prescribed therapeutic regimen will improve ?09/03/2021 1826 by Angeline Slim, RN ?Outcome: Progressing ?09/03/2021 1617 by Angeline Slim, RN ?Outcome: Progressing ?  ?Problem: Safety: ?Goal: Ability to redirect hostility and anger into socially appropriate behaviors will improve ?09/03/2021 1826 by Angeline Slim, RN ?Outcome: Progressing ?09/03/2021 1617 by Angeline Slim, RN ?Outcome: Progressing ?Goal: Ability to remain free from injury will improve ?09/03/2021 1826 by Angeline Slim, RN ?Outcome: Progressing ?09/03/2021 1617 by Angeline Slim, RN ?Outcome: Progressing ?  ?

## 2021-09-03 NOTE — Progress Notes (Signed)
Patient calm and cooperative. Denies SI, HI and AVH ?

## 2021-09-03 NOTE — Plan of Care (Signed)
  Problem: Education: Goal: Knowledge of [facility] Education information/materials will improve Outcome: Progressing Goal: Emotional status will improve Outcome: Progressing Goal: Mental status will improve Outcome: Progressing Goal: Verbalization of understanding the information provided will improve Outcome: Progressing   Problem: Safety: Goal: Periods of time without injury will increase Outcome: Progressing   Problem: Education: Goal: Will be free of psychotic symptoms Outcome: Progressing Goal: Knowledge of the prescribed therapeutic regimen will improve Outcome: Progressing   Problem: Safety: Goal: Ability to redirect hostility and anger into socially appropriate behaviors will improve Outcome: Progressing Goal: Ability to remain free from injury will improve Outcome: Progressing   

## 2021-09-04 DIAGNOSIS — F259 Schizoaffective disorder, unspecified: Secondary | ICD-10-CM | POA: Diagnosis not present

## 2021-09-04 NOTE — Progress Notes (Signed)
[facility] MD Progress Note ? ?09/04/2021 2:15 PM ?[name]  ?MRN:  [id number] ?Subjective: Nicholas Nielsen was seen and examined today.  He has no complaints.  He is taking his medications as prescribed and denies any side effects.  No issues. ?Principal Problem: Schizo affective schizophrenia ([place]) ?Diagnosis: Principal Problem: ?  Schizo affective schizophrenia ([place]) ? ?Total Time spent with patient: 15 minutes ? ? ?Past Medical History:  ?Past Medical History:  ?Diagnosis Date  ? Back pain   ? Bipolar 1 disorder ([place])   ? Hepatitis C   ? Kidney stones   ? Schizo affective schizophrenia ([place])   ? Testicular cyst   ? History reviewed. No pertinent surgical history. ?Family History:  ?Family History  ?Problem Relation Age of Onset  ? Stroke Father   ? Hypertension Father   ? ? ?Social History:  ?Social History  ? ?Substance and Sexual Activity  ?Alcohol Use Not Currently  ?   ?Social History  ? ?Substance and Sexual Activity  ?Drug Use Yes  ? Frequency: 7.0 times per week  ? Types: Marijuana  ? Comment: Daily use; last use is 03/26/2019  ?  ?Social History  ? ?Socioeconomic History  ? Marital status: Single  ?  Spouse name: Not on file  ? Number of children: Not on file  ? Years of education: Not on file  ? Highest education level: Not on file  ?Occupational History  ? Occupation: Unemployed  ?Tobacco Use  ? Smoking status: Every Day  ?  Packs/day: 1.00  ?  Years: 15.00  ?  Pack years: 15.00  ?  Types: Cigarettes  ? Smokeless tobacco: Never  ?Vaping Use  ? Vaping Use: Never used  ?Substance and Sexual Activity  ? Alcohol use: Not Currently  ? Drug use: Yes  ?  Frequency: 7.0 times per week  ?  Types: Marijuana  ?  Comment: Daily use; last use is 03/26/2019  ? Sexual activity: Never  ?  Birth control/protection: Condom  ?Other Topics Concern  ? Not on file  ?Social History Narrative  ? Pt stated that he is currently living with his brother.  He lives in [place] and is unemployed.  Pt stated that he recently arranged an  appointment with [facility].  ? ?Social Determinants of Health  ? ?Financial Resource Strain: Not on file  ?Food Insecurity: Not on file  ?Transportation Needs: Not on file  ?Physical Activity: Not on file  ?Stress: Not on file  ?Social Connections: Not on file  ? ?Additional Social History:  ?  ?  ?  ?  ?  ?  ?  ?  ?  ?  ?  ? ?Sleep: Good ? ?Appetite:  Good ? ?Current Medications: ?Current Facility-Administered Medications  ?Medication Dose Route Frequency Provider Last Rate Last Admin  ? acetaminophen (TYLENOL) tablet 650 mg  650 mg Oral Q6H PRN Sherlon Handing, NP   650 mg at 09/01/21 0758  ? alum & mag hydroxide-simeth (MAALOX/MYLANTA) 200-200-20 MG/5ML suspension 30 mL  30 mL Oral Q4H PRN Waldon Merl F, NP      ? amLODipine (NORVASC) tablet 5 mg  5 mg Oral BID Waldon Merl F, NP   5 mg at 09/03/21 1716  ? benztropine (COGENTIN) tablet 1 mg  1 mg Oral BID Waldon Merl F, NP   1 mg at 09/04/21 [id number]  ? hydrOXYzine (ATARAX) tablet 50 mg  50 mg Oral TID PRN Sherlon Handing, NP   50 mg at  09/04/21 0750  ? lithium carbonate (LITHOBID) CR tablet 600 mg  600 mg Oral Q12H Waldon Merl F, NP   600 mg at 09/04/21 [id number]  ? magnesium hydroxide (MILK OF MAGNESIA) suspension 30 mL  30 mL Oral Daily PRN Waldon Merl F, NP      ? nicotine (NICODERM CQ - dosed in mg/24 hours) patch 14 mg  14 mg Transdermal Daily Waldon Merl F, NP   14 mg at 09/04/21 0750  ? nicotine polacrilex (NICORETTE) gum 2 mg  2 mg Oral PRN Sherlon Handing, NP      ? pantoprazole (PROTONIX) EC tablet 40 mg  40 mg Oral Daily Clapacs, Madie Reno, MD   40 mg at 09/04/21 0744  ? propranolol (INDERAL) tablet 20 mg  20 mg Oral TID Clapacs, Madie Reno, MD   20 mg at 09/04/21 1210  ? risperiDONE (RISPERDAL) tablet 6 mg  6 mg Oral QHS Waldon Merl F, NP   6 mg at 09/03/21 2122  ? temazepam (RESTORIL) capsule 15 mg  15 mg Oral QHS PRN Sherlon Handing, NP   15 mg at 09/02/21 2054  ? ? ?Lab Results: No results found for this or any previous  visit (from the past 48 hour(s)). ? ?Blood Alcohol level:  ?Lab Results  ?Component Value Date  ? ETH <10 08/30/2021  ? ETH <10 03/26/2019  ? ? ?Metabolic Disorder Labs: ?Lab Results  ?Component Value Date  ? HGBA1C 5.2 08/31/2021  ? MPG 103 08/31/2021  ? MPG 96.8 05/05/2021  ? ?No results found for: PROLACTIN ?Lab Results  ?Component Value Date  ? CHOL 207 (H) 08/31/2021  ? TRIG 265 (H) 08/31/2021  ? HDL 31 (L) 08/31/2021  ? CHOLHDL 6.7 08/31/2021  ? VLDL 53 (H) 08/31/2021  ? LDLCALC 123 (H) 08/31/2021  ? [place] 150 (H) 05/05/2021  ? ? ?Physical Findings: ?AIMS:  , ,  ,  ,    ?CIWA:    ?COWS:    ? ?Musculoskeletal: ?Strength & Muscle Tone: within normal limits ?Gait & Station: normal ?Patient leans: N/A ? ?Psychiatric Specialty Exam: ? ?Presentation  ?General Appearance: Appropriate for Environment ? ?Eye Contact:Good ? ?Speech:Clear and Coherent ? ?Speech Volume:Normal ? ?Handedness:Right ? ? ?Mood and Affect  ?Mood:Depressed ? ?Affect:Tearful; Congruent ? ? ?Thought Process  ?Thought Processes:Coherent ? ?Descriptions of Associations:Intact ? ?Orientation:Full (Time, Place and Person) ? ?Thought Content:WDL ? ?History of Schizophrenia/Schizoaffective disorder:No ? ?Duration of Psychotic Symptoms:Less than six months ? ?Hallucinations:No data recorded ?Ideas of Reference:Paranoia; Delusions ? ?Suicidal Thoughts:No data recorded ?Homicidal Thoughts:No data recorded ? ?Sensorium  ?Memory:Immediate Good ? ?Judgment:Fair ? ?Insight:Good ? ? ?Executive Functions  ?Concentration:Fair ? ?Attention Span:Good ? ?Recall:Good ? ?Fund of [place] ? ?Language:Good ? ? ?Psychomotor Activity  ?Psychomotor Activity:No data recorded ? ?Assets  ?Assets:Desire for Improvement; Communication Skills; Financial Resources/Insurance; Housing; Resilience; Physical Health ? ? ?Sleep  ?Sleep:No data recorded ? ? ?Physical Exam: ?Physical Exam ?Vitals and nursing note reviewed.  ?Constitutional:   ?   Appearance: Normal appearance. He  is normal weight.  ?Neurological:  ?   General: No focal deficit present.  ?   Mental Status: He is alert and oriented to person, place, and time.  ?Psychiatric:     ?   Mood and Affect: Mood normal.     ?   Behavior: Behavior normal.  ? ?Review of Systems  ?Constitutional: Negative.   ?HENT: Negative.    ?Eyes: Negative.   ?Respiratory: Negative.    ?Cardiovascular: Negative.   ?  Gastrointestinal: Negative.   ?Genitourinary: Negative.   ?Musculoskeletal: Negative.   ?Skin: Negative.   ?Neurological: Negative.   ?Endo/Heme/Allergies: Negative.   ?Psychiatric/Behavioral: Negative.    ?Blood pressure 119/83, pulse 78, temperature 97.8 ?F (36.6 ?C), temperature source Oral, resp. rate 18, height 6\' 1"  (1.854 m), weight 67.6 kg, SpO2 99 %. Body mass index is 19.66 kg/m?. ? ? ?Treatment Plan Summary: ?Daily contact with patient to assess and evaluate symptoms and progress in treatment, Medication management, and Plan continue current medications. ? ?Parks Ranger, DO ?09/04/2021, 2:15 PM ? ?

## 2021-09-04 NOTE — Group Note (Signed)
[facility] LCSW Group Therapy Note ? ? ?Group Date: 09/04/2021 ?Start Time: 1315 ?End Time: 1415 ? ? ?Type of Therapy and Topic: Group Therapy: Avoiding Self-Sabotaging and Enabling Behaviors ? ?Participation Level: Did Not Attend ? ?Mood: ? ?Description of Group:  ?In this group, patients will learn how to identify obstacles, self-sabotaging and enabling behaviors, as well as: what are they, why do we do them and what needs these behaviors meet. Discuss unhealthy relationships and how to have positive healthy boundaries with those that sabotage and enable. Explore aspects of self-sabotage and enabling in yourself and how to limit these self-destructive behaviors in everyday life. ? ? ?Therapeutic Goals: ?1. Patient will identify one obstacle that relates to self-sabotage and enabling behaviors ?2. Patient will identify one personal self-sabotaging or enabling behavior they did prior to admission ?3. Patient will state a plan to change the above identified behavior ?4. Patient will demonstrate ability to communicate their needs through discussion and/or role play.  ? ? ?Summary of Patient Progress:  Due to limited staffing, group was not held on the unit.  ? ? ? ? ? ?Therapeutic Modalities:  ?Cognitive Behavioral Therapy ?Person-Centered Therapy ?Motivational Interviewing ? ? ? ?Johari Pinney K Chan Sheahan, LCSWA ?

## 2021-09-04 NOTE — Progress Notes (Signed)
Patient has been calm and cooperative. Has tissue in his ears that he says quiets the voices. Denies SI ?

## 2021-09-04 NOTE — Plan of Care (Signed)
?  Problem: Education: ?Goal: Knowledge of [facility] Education information/materials will improve ?Outcome: Progressing ?Goal: Verbalization of understanding the information provided will improve ?Outcome: Progressing ?  ?Problem: Safety: ?Goal: Periods of time without injury will increase ?Outcome: Progressing ?  ?Problem: Education: ?Goal: Will be free of psychotic symptoms ?Outcome: Not Progressing ?Goal: Knowledge of the prescribed therapeutic regimen will improve ?Outcome: Progressing ?  ?Problem: Safety: ?Goal: Ability to redirect hostility and anger into socially appropriate behaviors will improve ?Outcome: Progressing ?Goal: Ability to remain free from injury will improve ?Outcome: Progressing ?  ?

## 2021-09-04 NOTE — Progress Notes (Signed)
Patient denies pain. Patient rates anxiety 2/10. PRN medication for anxiety provided to patient. Patient denies depression. Patient denies SI/HI/AVH. Patient states he has been pretty good with the voices overnight. ?Patient compliant with medication administration. Q15 minute safety checks maintained. Patient remains safe on the unit at this time. ?

## 2021-09-04 NOTE — Plan of Care (Signed)
  Problem: Education: Goal: Knowledge of [facility] Education information/materials will improve Outcome: Progressing Goal: Emotional status will improve Outcome: Progressing Goal: Mental status will improve Outcome: Progressing Goal: Verbalization of understanding the information provided will improve Outcome: Progressing   Problem: Safety: Goal: Periods of time without injury will increase Outcome: Progressing   Problem: Education: Goal: Will be free of psychotic symptoms Outcome: Progressing Goal: Knowledge of the prescribed therapeutic regimen will improve Outcome: Progressing   Problem: Safety: Goal: Ability to redirect hostility and anger into socially appropriate behaviors will improve Outcome: Progressing Goal: Ability to remain free from injury will improve Outcome: Progressing   

## 2021-09-04 NOTE — Progress Notes (Signed)
Patient alert and oriented this shift. In and out of the day room. Expressed to RN he had difficulty sleeping.  ? ?Denies SI/HI/AVH, pain and depression. Rates anxiety 2/10. Prn medication given along with scheduled night medications. No adverse reactions to medications noted.  ? ?Patient remains safe on the unit. Cont Q15 minute check for safety.   ?

## 2021-09-04 NOTE — Plan of Care (Signed)
  Problem: Education: Goal: Knowledge of [facility] Education information/materials will improve Outcome: Progressing Goal: Emotional status will improve Outcome: Progressing Goal: Mental status will improve Outcome: Progressing Goal: Verbalization of understanding the information provided will improve Outcome: Progressing   Problem: Safety: Goal: Periods of time without injury will increase Outcome: Progressing   Problem: Education: Goal: Will be free of psychotic symptoms Outcome: Progressing Goal: Knowledge of the prescribed therapeutic regimen will improve Outcome: Progressing   Problem: Safety: Goal: Ability to redirect hostility and anger into socially appropriate behaviors will improve Outcome: Progressing Goal: Ability to remain free from injury will improve Outcome: Progressing   

## 2021-09-05 DIAGNOSIS — F259 Schizoaffective disorder, unspecified: Secondary | ICD-10-CM | POA: Diagnosis not present

## 2021-09-05 LAB — LITHIUM LEVEL: Lithium Lvl: 0.99 mmol/L (ref 0.60–1.20)

## 2021-09-05 MED ORDER — ZIPRASIDONE MESYLATE 20 MG IM SOLR
20.0000 mg | Freq: Two times a day (BID) | INTRAMUSCULAR | Status: DC | PRN
  Administered 2021-09-05 – 2021-09-11 (×2): 20 mg via INTRAMUSCULAR
  Filled 2021-09-05: qty 20

## 2021-09-05 MED ORDER — ZIPRASIDONE MESYLATE 20 MG IM SOLR
INTRAMUSCULAR | Status: AC
  Filled 2021-09-05: qty 20

## 2021-09-05 MED ORDER — OLANZAPINE 10 MG PO TABS
10.0000 mg | ORAL_TABLET | Freq: Every day | ORAL | Status: DC
  Administered 2021-09-05: 10 mg via ORAL
  Filled 2021-09-05: qty 1

## 2021-09-05 NOTE — Group Note (Signed)
[facility] LCSW Group Therapy Note ? ? ? ?Group Date: 09/05/2021 ?Start Time: 1300 ?End Time: 1400 ? ?Type of Therapy and Topic:  Group Therapy:  Overcoming Obstacles ? ?Participation Level:  [facility] PARTICIPATION LEVEL: Did Not Attend ? ?Mood: ? ?Description of Group:   ?In this group patients will be encouraged to explore what they see as obstacles to their own wellness and recovery. They will be guided to discuss their thoughts, feelings, and behaviors related to these obstacles. The group will process together ways to cope with barriers, with attention given to specific choices patients can make. Each patient will be challenged to identify changes they are motivated to make in order to overcome their obstacles. This group will be process-oriented, with patients participating in exploration of their own experiences as well as giving and receiving support and challenge from other group members. ? ?Therapeutic Goals: ?1. Patient will identify personal and current obstacles as they relate to admission. ?2. Patient will identify barriers that currently interfere with their wellness or overcoming obstacles.  ?3. Patient will identify feelings, thought process and behaviors related to these barriers. ?4. Patient will identify two changes they are willing to make to overcome these obstacles:  ? ? ?Summary of Patient Progress ? ? ?Patient did not attend group despite encouraged participation.  ? ? ?Therapeutic Modalities:   ?Cognitive Behavioral Therapy ?Solution Focused Therapy ?Motivational Interviewing ?Relapse Prevention Therapy ? ? ?Durenda Hurt, LCSWA ?

## 2021-09-05 NOTE — Progress Notes (Signed)
Recreation Therapy Notes ? ? ? ?Date: 09/05/2021 ? ?Time: 10:45 am   ? ?Location: Courtyard   ? ?Behavioral response: N/A ?  ?Intervention Topic: Leisure  ? ?Discussion/Intervention: ?Patient refused to attend group.  ? ?Clinical Observations/Feedback:  ?Patient refused to attend group.  ?  ?Teyonna Plaisted LRT/CTRS ? ? ? ? ? ? ? ? ?Tiffane Sheldon ?09/05/2021 12:38 PM ?

## 2021-09-05 NOTE — Progress Notes (Signed)
Pt calm and pleasant during assessment. Pt denies SI/HI/AVH. Pt isolative to his room tonight. Pt compliant with medication administration per MD orders. Pt given education, support, and encouragement to be active in his treatment plan. Pt being monitored Q 15 minutes for safety per unit protocol. Pt remains safe on the unit.  ?

## 2021-09-05 NOTE — Progress Notes (Signed)
Patient attempted to urinate on the unit walls. Patient approached and attempted to walk/redirect patient to his room where he could go to the bathroom. Patient refused to go to his room . Patient began getting verbally loud and aggressive making verbal threats to harm staff. MD was notified of event and orders were placed. Staff continue to attempt in deescalating the patient through therapeutic communication, redirections, and distraction. Patient's behavior continued to escalate. Patient finally went to his room but was still verbally aggressive and threatening. Patient refused de-escalation measures and had to be restrained for medication administration r/t unsafe behavior. Restraint for medication administration was approximately for one minute starting at 0939 and ending at 0940. After medication administration patient went to sleep.  ?

## 2021-09-05 NOTE — Progress Notes (Signed)
[facility] MD Progress Note  09/05/2021 4:20 PM Nicholas Nielsen  MRN:  [id number] Subjective: Follow-up patient with schizoaffective disorder.  This morning patient was extremely agitated.  Attempted to urinate on the walls.  Shortly thereafter he was in his room shouting at what appeared to be hallucinations not making any sense.  Required IM injections of medicine.  This despite lithium level today being 99 Principal Problem: Schizo affective schizophrenia ([facility]) Diagnosis: Principal Problem:   Schizo affective schizophrenia ([facility])  Total Time spent with patient: 30 minutes  Past Psychiatric History: Past history of severe psychosis with extended stays in the hospital waiting for improvement  Past Medical History:  Past Medical History:  Diagnosis Date   Back pain    Bipolar 1 disorder ([facility])    Hepatitis C    Kidney stones    Schizo affective schizophrenia [facility])    Testicular cyst    History reviewed. No pertinent surgical history. Family History:  Family History  Problem Relation Age of Onset   Stroke Father    Hypertension Father    Family Psychiatric  History: She previous Social History:  Social History   Substance and Sexual Activity  Alcohol Use Not Currently     Social History   Substance and Sexual Activity  Drug Use Yes   Frequency: 7.0 times per week   Types: Marijuana   Comment: Daily use; last use is 03/26/2019    Social History   Socioeconomic History   Marital status: Single    Spouse name: Not on file   Number of children: Not on file   Years of education: Not on file   Highest education level: Not on file  Occupational History   Occupation: Unemployed  Tobacco Use   Smoking status: Every Day    Packs/day: 1.00    Years: 15.00    Pack years: 15.00    Types: Cigarettes   Smokeless tobacco: Never  Vaping Use   Vaping Use: Never used  Substance and Sexual Activity   Alcohol use: Not Currently   Drug use: Yes    Frequency: 7.0 times per week     Types: Marijuana    Comment: Daily use; last use is 03/26/2019   Sexual activity: Never    Birth control/protection: Condom  Other Topics Concern   Not on file  Social History Narrative   Pt stated that he is currently living with his brother.  He lives in [place] and is unemployed.  Pt stated that he recently arranged an appointment with [facility].   Social Determinants of Health   Financial Resource Strain: Not on file  Food Insecurity: Not on file  Transportation Needs: Not on file  Physical Activity: Not on file  Stress: Not on file  Social Connections: Not on file   Additional Social History:                         Sleep: Fair  Appetite:  Fair  Current Medications: Current Facility-Administered Medications  Medication Dose Route Frequency Provider Last Rate Last Admin   acetaminophen (TYLENOL) tablet 650 mg  650 mg Oral Q6H PRN Gabriel Cirri F, NP   650 mg at 09/01/21 0758   alum & mag hydroxide-simeth (MAALOX/MYLANTA) 200-200-20 MG/5ML suspension 30 mL  30 mL Oral Q4H PRN Gabriel Cirri F, NP       amLODipine (NORVASC) tablet 5 mg  5 mg Oral BID Gabriel Cirri F, NP   5 mg at  09/05/21 0745   benztropine (COGENTIN) tablet 1 mg  1 mg Oral BID Gabriel Cirri F, NP   1 mg at 09/05/21 0745   hydrOXYzine (ATARAX) tablet 50 mg  50 mg Oral TID PRN Vanetta Mulders, NP   50 mg at 09/04/21 2052   lithium carbonate (LITHOBID) CR tablet 600 mg  600 mg Oral Q12H Gabriel Cirri F, NP   600 mg at 09/05/21 0745   magnesium hydroxide (MILK OF MAGNESIA) suspension 30 mL  30 mL Oral Daily PRN Gabriel Cirri F, NP       nicotine (NICODERM CQ - dosed in mg/24 hours) patch 14 mg  14 mg Transdermal Daily Gabriel Cirri F, NP   14 mg at 09/05/21 0746   nicotine polacrilex (NICORETTE) gum 2 mg  2 mg Oral PRN Vanetta Mulders, NP       OLANZapine (ZYPREXA) tablet 10 mg  10 mg Oral QHS Jakelyn Squyres T, MD       pantoprazole (PROTONIX) EC tablet 40 mg  40 mg Oral Daily  Devaunte Gasparini, Jackquline Denmark, MD   40 mg at 09/05/21 0745   propranolol (INDERAL) tablet 20 mg  20 mg Oral TID Clary Meeker T, MD   20 mg at 09/05/21 1236   risperiDONE (RISPERDAL) tablet 6 mg  6 mg Oral QHS Gabriel Cirri F, NP   6 mg at 09/04/21 2143   temazepam (RESTORIL) capsule 15 mg  15 mg Oral QHS PRN Vanetta Mulders, NP   15 mg at 09/04/21 2051   ziprasidone (GEODON) 20 MG injection            ziprasidone (GEODON) injection 20 mg  20 mg Intramuscular Q12H PRN Kenlynn Houde, Jackquline Denmark, MD   20 mg at 09/05/21 7829    Lab Results:  Results for orders placed or performed during the hospital encounter of 08/30/21 (from the past 48 hour(s))  Lithium level     Status: None   Collection Time: 09/05/21  6:06 AM  Result Value Ref Range   Lithium Lvl 0.99 0.60 - 1.20 mmol/L    Comment: Performed at [facility], [address] [place], [place] [address]    Blood Alcohol level:  Lab Results  Component Value Date   [facility] <10 08/30/2021   ETH <10 03/26/2019    Metabolic Disorder Labs: Lab Results  Component Value Date   HGBA1C 5.2 08/31/2021   MPG 103 08/31/2021   MPG 96.8 05/05/2021   No results found for: PROLACTIN Lab Results  Component Value Date   CHOL 207 (H) 08/31/2021   TRIG 265 (H) 08/31/2021   HDL 31 (L) 08/31/2021   CHOLHDL 6.7 08/31/2021   VLDL 53 (H) 08/31/2021   LDLCALC 123 (H) 08/31/2021   LDLCALC 150 (H) 05/05/2021    Physical Findings: AIMS:  , ,  ,  ,    CIWA:    COWS:     Musculoskeletal: Strength & Muscle Tone: within normal limits Gait & Station: normal Patient leans: N/A  Psychiatric Specialty Exam:  Presentation  General Appearance: Appropriate for Environment  Eye Contact:Good  Speech:Clear and Coherent  Speech Volume:Normal  Handedness:Right   Mood and Affect  Mood:Depressed  Affect:Tearful; Congruent   Thought Process  Thought Processes:Coherent  Descriptions of Associations:Intact  Orientation:Full (Time, Place and  Person)  Thought Content:WDL  History of Schizophrenia/Schizoaffective disorder:No  Duration of Psychotic Symptoms:Less than six months  Hallucinations:No data recorded Ideas of Reference:Paranoia; Delusions  Suicidal Thoughts:No data recorded Homicidal Thoughts:No  data recorded  Sensorium  Memory:Immediate Good  Judgment:Fair  Insight:Good   Executive Functions  Concentration:Fair  Attention Span:Good  Recall:Good  Fund of Knowledge:Good  Language:Good   Psychomotor Activity  Psychomotor Activity:No data recorded  Assets  Assets:Desire for Improvement; Manufacturing systems engineer; Financial Resources/Insurance; Housing; Resilience; Physical Health   Sleep  Sleep:No data recorded   Physical Exam: Physical Exam Vitals and nursing note reviewed.  Constitutional:      Appearance: Normal appearance.  HENT:     Head: Normocephalic and atraumatic.     Mouth/Throat:     Pharynx: Oropharynx is clear.  Eyes:     Pupils: Pupils are equal, round, and reactive to light.  Cardiovascular:     Rate and Rhythm: Normal rate and regular rhythm.  Pulmonary:     Effort: Pulmonary effort is normal.     Breath sounds: Normal breath sounds.  Abdominal:     General: Abdomen is flat.     Palpations: Abdomen is soft.  Musculoskeletal:        General: Normal range of motion.  Skin:    General: Skin is warm and dry.  Neurological:     General: No focal deficit present.     Mental Status: He is alert. Mental status is at baseline.  Psychiatric:        Attention and Perception: He is inattentive. He perceives auditory hallucinations.        Mood and Affect: Mood normal. Affect is labile and inappropriate.        Speech: Speech is rapid and pressured and tangential.        Behavior: Behavior is agitated.        Thought Content: Thought content is delusional.        Cognition and Memory: Cognition is impaired.        Judgment: Judgment is inappropriate.   Review of Systems   Constitutional: Negative.   HENT: Negative.    Eyes: Negative.   Respiratory: Negative.    Cardiovascular: Negative.   Gastrointestinal: Negative.   Musculoskeletal: Negative.   Skin: Negative.   Neurological: Negative.   Psychiatric/Behavioral:  Positive for hallucinations. Negative for depression, memory loss, substance abuse and suicidal ideas. The patient has insomnia. The patient is not nervous/anxious.   Blood pressure 107/74, pulse 71, temperature 98.5 F (36.9 C), temperature source Oral, resp. rate 18, height 6\' 1"  (1.854 m), weight 67.6 kg, SpO2 98 %. Body mass index is 19.66 kg/m.   Treatment Plan Summary: Medication management and Plan patient is on 6 mg of Risperdal plus his lithium at a good level.  Risperdal does not seem to be as effective as previously.  I am going to start Zyprexa 10 mg a day and then try cross titrating.  Meanwhile no change to lithium as level of 0.99 today.  He did get a shot of Geodon this morning when he was very agitated.  Mordecai Rasmussen, MD 09/05/2021, 4:20 PM

## 2021-09-05 NOTE — Progress Notes (Signed)
Patient denies pain. Patient rates anxiety 2/10. Patient denies depression. Patient denies SI/HI/AVH. Patient states that he hasnt been hearing voices throughout the night or this morning at the time of assessment.  ?At 0939 PRN Geodon IM injection was administered to patient due to increased agitation and verbal aggression by patient. ?Patient behavior has been appropriate on the unit after medication.  ?Patient has been compliant with medication administration.  ?Q15 minute safety checks maintained. Patient remains safe on the unit at this time. ?

## 2021-09-05 NOTE — Progress Notes (Signed)
Patient up pacing several times throughout the night. No prns requested. ?

## 2021-09-05 NOTE — Plan of Care (Signed)
?  Problem: Education: ?Goal: Knowledge of [facility] Education information/materials will improve ?Outcome: Progressing ?Goal: Verbalization of understanding the information provided will improve ?Outcome: Progressing ?  ?Problem: Safety: ?Goal: Periods of time without injury will increase ?Outcome: Progressing ?  ?Problem: Education: ?Goal: Will be free of psychotic symptoms ?Outcome: Progressing ?Goal: Knowledge of the prescribed therapeutic regimen will improve ?Outcome: Progressing ?  ?Problem: Safety: ?Goal: Ability to redirect hostility and anger into socially appropriate behaviors will improve ?Outcome: Progressing ?Goal: Ability to remain free from injury will improve ?Outcome: Progressing ?  ?

## 2021-09-05 NOTE — BH IP Treatment Plan (Signed)
Interdisciplinary Treatment and Diagnostic Plan Update  09/05/2021 Time of Session: 8:30AM Nicholas Nielsen MRN: [id number]  Principal Diagnosis: Schizo affective schizophrenia [facility])  Secondary Diagnoses: Principal Problem:   Schizo affective schizophrenia (HCC)   Current Medications:  Current Facility-Administered Medications  Medication Dose Route Frequency Provider Last Rate Last Admin   acetaminophen (TYLENOL) tablet 650 mg  650 mg Oral Q6H PRN Gabriel Cirri F, NP   650 mg at 09/01/21 0758   alum & mag hydroxide-simeth (MAALOX/MYLANTA) 200-200-20 MG/5ML suspension 30 mL  30 mL Oral Q4H PRN Gabriel Cirri F, NP       amLODipine (NORVASC) tablet 5 mg  5 mg Oral BID Gabriel Cirri F, NP   5 mg at 09/05/21 0745   benztropine (COGENTIN) tablet 1 mg  1 mg Oral BID Gabriel Cirri F, NP   1 mg at 09/05/21 0745   hydrOXYzine (ATARAX) tablet 50 mg  50 mg Oral TID PRN Vanetta Mulders, NP   50 mg at 09/04/21 2052   lithium carbonate (LITHOBID) CR tablet 600 mg  600 mg Oral Q12H Gabriel Cirri F, NP   600 mg at 09/05/21 0745   magnesium hydroxide (MILK OF MAGNESIA) suspension 30 mL  30 mL Oral Daily PRN Gabriel Cirri F, NP       nicotine (NICODERM CQ - dosed in mg/24 hours) patch 14 mg  14 mg Transdermal Daily Gabriel Cirri F, NP   14 mg at 09/05/21 0746   nicotine polacrilex (NICORETTE) gum 2 mg  2 mg Oral PRN Vanetta Mulders, NP       pantoprazole (PROTONIX) EC tablet 40 mg  40 mg Oral Daily Clapacs, John T, MD   40 mg at 09/05/21 0745   propranolol (INDERAL) tablet 20 mg  20 mg Oral TID Clapacs, John T, MD   20 mg at 09/05/21 0745   risperiDONE (RISPERDAL) tablet 6 mg  6 mg Oral QHS Gabriel Cirri F, NP   6 mg at 09/04/21 2143   temazepam (RESTORIL) capsule 15 mg  15 mg Oral QHS PRN Vanetta Mulders, NP   15 mg at 09/04/21 2051   ziprasidone (GEODON) 20 MG injection            ziprasidone (GEODON) injection 20 mg  20 mg Intramuscular Q12H PRN Clapacs, Jackquline Denmark, MD   20  mg at 09/05/21 5366   PTA Medications: Medications Prior to Admission  Medication Sig Dispense Refill Last Dose   amLODipine (NORVASC) 5 MG tablet Take 1 tablet (5 mg total) by mouth daily. 10 tablet 0    amLODipine (NORVASC) 5 MG tablet Take 1 tablet (5 mg total) by mouth daily. 30 tablet 1    benztropine (COGENTIN) 0.5 MG tablet Take 2 tablets (1 mg total) by mouth 2 (two) times daily. 40 tablet 0    benztropine (COGENTIN) 1 MG tablet Take 1 tablet (1 mg total) by mouth 2 (two) times daily. 60 tablet 1    hydrOXYzine (ATARAX/VISTARIL) 50 MG tablet Take 1 tablet (50 mg total) by mouth every 6 (six) hours as needed for anxiety. 20 tablet 0    hydrOXYzine (ATARAX/VISTARIL) 50 MG tablet Take 1 tablet (50 mg total) by mouth every 6 (six) hours as needed for anxiety. 60 tablet 1    lithium carbonate (LITHOBID) 300 MG CR tablet Take 2 tablets (600 mg total) by mouth every 12 (twelve) hours. 40 tablet 0    lithium carbonate (LITHOBID) 300 MG CR tablet Take 2 tablets (600  mg total) by mouth every 12 (twelve) hours. 120 tablet 1    nicotine (NICODERM CQ - DOSED IN MG/24 HOURS) 14 mg/24hr patch Place 1 patch (14 mg total) onto the skin daily. 14 patch 0    nicotine (NICODERM CQ - DOSED IN MG/24 HOURS) 14 mg/24hr patch Place 1 patch (14 mg total) onto the skin daily. 28 patch 1    propranolol (INDERAL) 20 MG tablet Take 1 tablet (20 mg total) by mouth 2 (two) times daily. 20 tablet 0    propranolol (INDERAL) 20 MG tablet Take 1 tablet (20 mg total) by mouth 2 (two) times daily. 60 tablet 1    risperiDONE (RISPERDAL) 2 MG tablet Take 3 tablets (6 mg total) by mouth 2 (two) times daily. 60 tablet 0    risperiDONE (RISPERDAL) 3 MG tablet Take 2 tablets (6 mg total) by mouth 2 (two) times daily. 120 tablet 1    temazepam (RESTORIL) 15 MG capsule Take 1 capsule (15 mg total) by mouth at bedtime as needed for sleep. 30 capsule 1     Patient Stressors:    Patient Strengths:    Treatment Modalities:  Medication Management, Group therapy, Case management,  1 to 1 session with clinician, Psychoeducation, Recreational therapy.   Physician Treatment Plan for Primary Diagnosis: Schizo affective schizophrenia (HCC) Long Term Goal(s): Improvement in symptoms so as ready for discharge   Short Term Goals: Ability to maintain clinical measurements within normal limits will improve Compliance with prescribed medications will improve Ability to disclose and discuss suicidal ideas Ability to demonstrate self-control will improve  Medication Management: Evaluate patient's response, side effects, and tolerance of medication regimen.  Therapeutic Interventions: 1 to 1 sessions, Unit Group sessions and Medication administration.  Evaluation of Outcomes: Not Progressing  Physician Treatment Plan for Secondary Diagnosis: Principal Problem:   Schizo affective schizophrenia (HCC)  Long Term Goal(s): Improvement in symptoms so as ready for discharge   Short Term Goals: Ability to maintain clinical measurements within normal limits will improve Compliance with prescribed medications will improve Ability to disclose and discuss suicidal ideas Ability to demonstrate self-control will improve     Medication Management: Evaluate patient's response, side effects, and tolerance of medication regimen.  Therapeutic Interventions: 1 to 1 sessions, Unit Group sessions and Medication administration.  Evaluation of Outcomes: Not Progressing   RN Treatment Plan for Primary Diagnosis: Schizo affective schizophrenia (HCC) Long Term Goal(s): Knowledge of disease and therapeutic regimen to maintain health will improve  Short Term Goals: Ability to verbalize frustration and anger appropriately will improve, Ability to demonstrate self-control, Ability to participate in decision making will improve, Ability to verbalize feelings will improve, Ability to disclose and discuss suicidal ideas, Ability to identify and  develop effective coping behaviors will improve, and Compliance with prescribed medications will improve  Medication Management: RN will administer medications as ordered by provider, will assess and evaluate patient's response and provide education to patient for prescribed medication. RN will report any adverse and/or side effects to prescribing provider.  Therapeutic Interventions: 1 on 1 counseling sessions, Psychoeducation, Medication administration, Evaluate responses to treatment, Monitor vital signs and CBGs as ordered, Perform/monitor CIWA, COWS, AIMS and Fall Risk screenings as ordered, Perform wound care treatments as ordered.  Evaluation of Outcomes: Not Progressing   LCSW Treatment Plan for Primary Diagnosis: Schizo affective schizophrenia (HCC) Long Term Goal(s): Safe transition to appropriate next level of care at discharge, Engage patient in therapeutic group addressing interpersonal concerns.  Short Term Goals: Engage  patient in aftercare planning with referrals and resources, Increase social support, Increase ability to appropriately verbalize feelings, Increase emotional regulation, Facilitate acceptance of mental health diagnosis and concerns, Facilitate patient progression through stages of change regarding substance use diagnoses and concerns, and Increase skills for wellness and recovery  Therapeutic Interventions: Assess for all discharge needs, 1 to 1 time with Social worker, Explore available resources and support systems, Assess for adequacy in community support network, Educate family and significant other(s) on suicide prevention, Complete Psychosocial Assessment, Interpersonal group therapy.  Evaluation of Outcomes: Not Progressing   Progress in Treatment: Attending groups: No. Participating in groups: No. Taking medication as prescribed: Yes. Toleration medication: Yes. Family/Significant other contact made: Yes, individual(s) contacted:  SPE completed with the  patient.  Patient understands diagnosis: Yes. Discussing patient identified problems/goals with staff: Yes. Medical problems stabilized or resolved: Yes. Denies suicidal/homicidal ideation: Yes. Issues/concerns per patient self-inventory: No. Other: none  New problem(s) identified: No, Describe:  none  New Short Term/Long Term Goal(s): Patient to work towards elimination of symptoms of psychosis, medication management for mood stabilization; development of comprehensive mental wellness plan.  Update 09/05/2021:  No changes at this time.    Patient Goals:  Patient states, " My mental states, got pictures coming up in my head and never had that before." Update 09/05/2021:  No changes at this time.    Discharge Plan or Barriers: No psychosocial barriers identified at this time, patient to return to place of residence when appropriate for discharge. Reports living with mother.  Update 09/05/2021:  Patient continues to report auditory hallucinations at this time.  Patient currently hears voices and uses toilet paper to stuff his ears to assist with the hallucinations.  Recently he has been attempting to urinate in the floor.  Once behaviors improve further discussions can be had on discharge.   Reason for Continuation of Hospitalization: Hallucinations   Estimated Length of Stay: 1-7 days      Scribe for Treatment Team: AKHILESH SPARR, LCSW 09/05/2021 11:07 AM

## 2021-09-06 DIAGNOSIS — F259 Schizoaffective disorder, unspecified: Secondary | ICD-10-CM | POA: Diagnosis not present

## 2021-09-06 MED ORDER — OLANZAPINE 5 MG PO TABS
15.0000 mg | ORAL_TABLET | Freq: Every day | ORAL | Status: DC
  Administered 2021-09-06 – 2021-09-07 (×2): 15 mg via ORAL
  Filled 2021-09-06 (×2): qty 1

## 2021-09-06 NOTE — Progress Notes (Signed)
Recreation Therapy Notes ? ? ?Date: 09/06/2021 ? ?Time: 10:25 am   ? ?Location: Courtyard   ? ?Behavioral response: N/A ?  ?Intervention Topic: Wellness  ? ?Discussion/Intervention: ?Patient refused to attend group.  ? ?Clinical Observations/Feedback:  ?Patient refused to attend group.  ?  ?Addylin Manke LRT/CTRS ? ? ? ? ? ? ? ?Qianna Clagett ?09/06/2021 12:27 PM ?

## 2021-09-06 NOTE — Progress Notes (Signed)
?   09/06/21 3474  ?Psych Admission Type (Psych Patients Only)  ?Admission Status Involuntary  ?Psychosocial Assessment  ?Patient Complaints None  ?Eye Contact Fair  ?Facial Expression Flat  ?Affect Flat  ?Speech Soft  ?Interaction Cautious  ?Motor Activity Slow  ?Appearance/Hygiene Unremarkable  ?Behavior Characteristics Cooperative  ?Mood Other (Comment) ?(Polite)  ?Aggressive Behavior  ?Effect No apparent injury  ?Thought Process  ?Coherency Other (Comment) ?(Says NO, Yes, Thank you.)  ?Content Other (Comment) ?(Slightly engaged in conversation)  ?Delusions None reported or observed  ?Perception WDL  ?Hallucination None reported or observed  ?Judgment Limited  ?Confusion WDL  ?Danger to Self  ?Current suicidal ideation? Denies  ?Danger to Others  ?Danger to Others None reported or observed  ? ?Pt. Is alert and oriented X's 4 during shift assessment. Patient is pleasant and polite on approach during shift assessment. Pt. Denies SI/HI/AVH, he is compliant with scheduled medication and well tolerated by patient. Patient denies pain or any discomfort. No distress noted at this time. Staff will continue to support patient. ?

## 2021-09-06 NOTE — Plan of Care (Signed)
  Problem: Safety: Goal: Periods of time without injury will increase Outcome: Progressing   Problem: Education: Goal: Will be free of psychotic symptoms Outcome: Progressing Goal: Knowledge of the prescribed therapeutic regimen will improve Outcome: Progressing   

## 2021-09-06 NOTE — Progress Notes (Signed)
[facility] MD Progress Note  09/06/2021 3:09 PM Nicholas Nielsen  MRN:  [id number] Subjective: Follow-up for this 41 year old man with schizophrenia or schizoaffective disorder.  Patient is continuing to have auditory hallucinations but he reports that today they are less severe than yesterday.  Not screaming out or getting hostile.  Able to have a conversation.  Still stays in his bed almost all the time with a pillow covering his head. Principal Problem: Schizo affective schizophrenia (HCC) Diagnosis: Principal Problem:   Schizo affective schizophrenia (HCC)  Total Time spent with patient: 30 minutes  Past Psychiatric History: Past history of psychotic symptoms manic symptoms  Past Medical History:  Past Medical History:  Diagnosis Date   Back pain    Bipolar 1 disorder (HCC)    Hepatitis C    Kidney stones    Schizo affective schizophrenia [facility])    Testicular cyst    History reviewed. No pertinent surgical history. Family History:  Family History  Problem Relation Age of Onset   Stroke Father    Hypertension Father    Family Psychiatric  History: See previous Social History:  Social History   Substance and Sexual Activity  Alcohol Use Not Currently     Social History   Substance and Sexual Activity  Drug Use Yes   Frequency: 7.0 times per week   Types: Marijuana   Comment: Daily use; last use is 03/26/2019    Social History   Socioeconomic History   Marital status: Single    Spouse name: Not on file   Number of children: Not on file   Years of education: Not on file   Highest education level: Not on file  Occupational History   Occupation: Unemployed  Tobacco Use   Smoking status: Every Day    Packs/day: 1.00    Years: 15.00    Pack years: 15.00    Types: Cigarettes   Smokeless tobacco: Never  Vaping Use   Vaping Use: Never used  Substance and Sexual Activity   Alcohol use: Not Currently   Drug use: Yes    Frequency: 7.0 times per week    Types: Marijuana     Comment: Daily use; last use is 03/26/2019   Sexual activity: Never    Birth control/protection: Condom  Other Topics Concern   Not on file  Social History Narrative   Pt stated that he is currently living with his brother.  He lives in [place] and is unemployed.  Pt stated that he recently arranged an appointment with [facility].   Social Determinants of Health   Financial Resource Strain: Not on file  Food Insecurity: Not on file  Transportation Needs: Not on file  Physical Activity: Not on file  Stress: Not on file  Social Connections: Not on file   Additional Social History:                         Sleep: Good  Appetite:  Good  Current Medications: Current Facility-Administered Medications  Medication Dose Route Frequency Provider Last Rate Last Admin   acetaminophen (TYLENOL) tablet 650 mg  650 mg Oral Q6H PRN Gabriel Cirri F, NP   650 mg at 09/01/21 0758   alum & mag hydroxide-simeth (MAALOX/MYLANTA) 200-200-20 MG/5ML suspension 30 mL  30 mL Oral Q4H PRN Gabriel Cirri F, NP       amLODipine (NORVASC) tablet 5 mg  5 mg Oral BID Gabriel Cirri F, NP   5 mg at 09/06/21  0847   benztropine (COGENTIN) tablet 1 mg  1 mg Oral BID Gabriel Cirri F, NP   1 mg at 09/06/21 0847   hydrOXYzine (ATARAX) tablet 50 mg  50 mg Oral TID PRN Vanetta Mulders, NP   50 mg at 09/04/21 2052   lithium carbonate (LITHOBID) CR tablet 600 mg  600 mg Oral Q12H Gabriel Cirri F, NP   600 mg at 09/06/21 0847   magnesium hydroxide (MILK OF MAGNESIA) suspension 30 mL  30 mL Oral Daily PRN Gabriel Cirri F, NP   30 mL at 09/06/21 0847   nicotine (NICODERM CQ - dosed in mg/24 hours) patch 14 mg  14 mg Transdermal Daily Gabriel Cirri F, NP   14 mg at 09/06/21 0848   nicotine polacrilex (NICORETTE) gum 2 mg  2 mg Oral PRN Vanetta Mulders, NP       OLANZapine (ZYPREXA) tablet 15 mg  15 mg Oral QHS Kennadi Albany T, MD       pantoprazole (PROTONIX) EC tablet 40 mg  40 mg Oral  Daily Faithlyn Recktenwald, Jackquline Denmark, MD   40 mg at 09/06/21 0847   propranolol (INDERAL) tablet 20 mg  20 mg Oral TID Woodie Degraffenreid, Jackquline Denmark, MD   20 mg at 09/06/21 0848   risperiDONE (RISPERDAL) tablet 6 mg  6 mg Oral QHS Gabriel Cirri F, NP   6 mg at 09/05/21 2115   temazepam (RESTORIL) capsule 15 mg  15 mg Oral QHS PRN Vanetta Mulders, NP   15 mg at 09/05/21 2115   ziprasidone (GEODON) injection 20 mg  20 mg Intramuscular Q12H PRN Keianna Signer, Jackquline Denmark, MD   20 mg at 09/05/21 9735    Lab Results:  Results for orders placed or performed during the hospital encounter of 08/30/21 (from the past 48 hour(s))  Lithium level     Status: None   Collection Time: 09/05/21  6:06 AM  Result Value Ref Range   Lithium Lvl 0.99 0.60 - 1.20 mmol/L    Comment: Performed at [facility], [address] [place], [place] [address]    Blood Alcohol level:  Lab Results  Component Value Date   [facility] <10 08/30/2021   ETH <10 03/26/2019    Metabolic Disorder Labs: Lab Results  Component Value Date   HGBA1C 5.2 08/31/2021   MPG 103 08/31/2021   MPG 96.8 05/05/2021   No results found for: PROLACTIN Lab Results  Component Value Date   CHOL 207 (H) 08/31/2021   TRIG 265 (H) 08/31/2021   HDL 31 (L) 08/31/2021   CHOLHDL 6.7 08/31/2021   VLDL 53 (H) 08/31/2021   LDLCALC 123 (H) 08/31/2021   LDLCALC 150 (H) 05/05/2021    Physical Findings: AIMS:  , ,  ,  ,    CIWA:    COWS:     Musculoskeletal: Strength & Muscle Tone: within normal limits Gait & Station: normal Patient leans: N/A  Psychiatric Specialty Exam:  Presentation  General Appearance: Appropriate for Environment  Eye Contact:Good  Speech:Clear and Coherent  Speech Volume:Normal  Handedness:Right   Mood and Affect  Mood:Depressed  Affect:Tearful; Congruent   Thought Process  Thought Processes:Coherent  Descriptions of Associations:Intact  Orientation:Full (Time, Place and Person)  Thought Content:WDL  History of  Schizophrenia/Schizoaffective disorder:No  Duration of Psychotic Symptoms:Less than six months  Hallucinations:No data recorded Ideas of Reference:Paranoia; Delusions  Suicidal Thoughts:No data recorded Homicidal Thoughts:No data recorded  Sensorium  Memory:Immediate Good  Judgment:Fair  Insight:Good   Executive  Functions  Concentration:Fair  Attention Span:Good  Recall:Good  Fund of Knowledge:Good  Language:Good   Psychomotor Activity  Psychomotor Activity:No data recorded  Assets  Assets:Desire for Improvement; Communication Skills; Financial Resources/Insurance; Housing; Resilience; Physical Health   Sleep  Sleep:No data recorded   Physical Exam: Physical Exam Vitals and nursing note reviewed.  Constitutional:      Appearance: Normal appearance.  HENT:     Head: Normocephalic and atraumatic.     Mouth/Throat:     Pharynx: Oropharynx is clear.  Eyes:     Pupils: Pupils are equal, round, and reactive to light.  Cardiovascular:     Rate and Rhythm: Normal rate and regular rhythm.  Pulmonary:     Effort: Pulmonary effort is normal.     Breath sounds: Normal breath sounds.  Abdominal:     General: Abdomen is flat.     Palpations: Abdomen is soft.  Musculoskeletal:        General: Normal range of motion.  Skin:    General: Skin is warm and dry.  Neurological:     General: No focal deficit present.     Mental Status: He is alert. Mental status is at baseline.  Psychiatric:        Attention and Perception: He is inattentive. He perceives auditory hallucinations.        Mood and Affect: Mood normal. Affect is blunt.        Speech: Speech is delayed.        Behavior: Behavior is slowed.        Thought Content: Thought content normal. Thought content does not include suicidal ideation.        Cognition and Memory: Cognition is impaired.   Review of Systems  Constitutional: Negative.   HENT: Negative.    Eyes: Negative.   Respiratory: Negative.     Cardiovascular: Negative.   Gastrointestinal: Negative.   Musculoskeletal: Negative.   Skin: Negative.   Neurological: Negative.   Psychiatric/Behavioral:  Positive for hallucinations. Negative for depression, substance abuse and suicidal ideas. The patient is nervous/anxious and has insomnia.   Blood pressure 94/76, pulse 82, temperature 98.5 F (36.9 C), temperature source Oral, resp. rate 18, height 6\' 1"  (1.854 m), weight 67.6 kg, SpO2 98 %. Body mass index is 19.66 kg/m.   Treatment Plan Summary: Medication management and Plan increasing olanzapine to 15 mg a day to continue the ultimate plan of cross tapering to a new antipsychotic.  , MD 09/06/2021, 3:09 PM

## 2021-09-06 NOTE — Group Note (Signed)
[place] LCSW Group Therapy Note ? ? ? ?Group Date: 09/06/2021 ?Start Time: 1300 ?End Time: 1400 ? ?Type of Therapy and Topic:  Group Therapy:  Overcoming Obstacles ? ?Participation Level:  [facility] PARTICIPATION LEVEL: Did Not Attend ? ?Mood: ? ?Description of Group:   ?In this group patients will be encouraged to explore what they see as obstacles to their own wellness and recovery. They will be guided to discuss their thoughts, feelings, and behaviors related to these obstacles. The group will process together ways to cope with barriers, with attention given to specific choices patients can make. Each patient will be challenged to identify changes they are motivated to make in order to overcome their obstacles. This group will be process-oriented, with patients participating in exploration of their own experiences as well as giving and receiving support and challenge from other group members. ? ?Therapeutic Goals: ?1. Patient will identify personal and current obstacles as they relate to admission. ?2. Patient will identify barriers that currently interfere with their wellness or overcoming obstacles.  ?3. Patient will identify feelings, thought process and behaviors related to these barriers. ?4. Patient will identify two changes they are willing to make to overcome these obstacles:  ? ? ?Summary of Patient Progress ?Patient did not attend group despite encouraged participation.   ? ? ?Therapeutic Modalities:   ?Cognitive Behavioral Therapy ?Solution Focused Therapy ?Motivational Interviewing ?Relapse Prevention Therapy ? ? ?Durenda Hurt, LCSWA ?

## 2021-09-07 DIAGNOSIS — F259 Schizoaffective disorder, unspecified: Secondary | ICD-10-CM | POA: Diagnosis not present

## 2021-09-07 MED ORDER — DOCUSATE SODIUM 100 MG PO CAPS
100.0000 mg | ORAL_CAPSULE | Freq: Two times a day (BID) | ORAL | Status: DC
  Administered 2021-09-07 – 2021-09-12 (×9): 100 mg via ORAL
  Filled 2021-09-07 (×10): qty 1

## 2021-09-07 MED ORDER — SENNOSIDES-DOCUSATE SODIUM 8.6-50 MG PO TABS: 2.0000 | ORAL_TABLET | Freq: Once | ORAL | Status: DC

## 2021-09-07 NOTE — Group Note (Unsigned)
[facility] LCSW Group Therapy Note ? ? ?Group Date: 09/07/2021 ?Start Time: 1300 ?End Time: 1400 ? ? ?Type of Therapy/Topic:  Group Therapy:  Emotion Regulation ? ?Participation Level:  {[facility] PARTICIPATION LEVEL:22264}  ? ?Mood: ? ?Description of Group:   ? The purpose of this group is to assist patients in learning to regulate negative emotions and experience positive emotions. Patients will be guided to discuss ways in which they have been vulnerable to their negative emotions. These vulnerabilities will be juxtaposed with experiences of positive emotions or situations, and patients challenged to use positive emotions to combat negative ones. Special emphasis will be placed on coping with negative emotions in conflict situations, and patients will process healthy conflict resolution skills. ? ?Therapeutic Goals: ?1. Patient will identify two positive emotions or experiences to reflect on in order to balance out negative emotions:  ?2. Patient will label two or more emotions that they find the most difficult to experience:  ?3. Patient will be able to demonstrate positive conflict resolution skills through discussion or role plays:  ? ?Summary of Patient Progress: ? ? ?*** ? ? ? ?Therapeutic Modalities:   ?Cognitive Behavioral Therapy ?Feelings Identification ?Dialectical Behavioral Therapy ? ? ?Nicholas Nielsen, LCSWA ?

## 2021-09-07 NOTE — Progress Notes (Signed)
?   09/07/21 0100  ?Psych Admission Type (Psych Patients Only)  ?Admission Status Involuntary  ?Psychosocial Assessment  ?Patient Complaints None  ?Eye Contact Fair  ?Facial Expression Flat  ?Affect Depressed  ?Speech Soft  ?Interaction Cautious  ?Motor Activity Slow  ?Appearance/Hygiene Unremarkable  ?Behavior Characteristics Cooperative  ?Mood Labile  ?Aggressive Behavior  ?Effect No apparent injury  ?Thought Process  ?Coherency Disorganized  ?Content UTA  ?Delusions None reported or observed  ?Perception WDL  ?Hallucination None reported or observed  ?Judgment Limited  ?Confusion WDL  ?Danger to Self  ?Current suicidal ideation? Denies  ?Danger to Others  ?Danger to Others None reported or observed  ? ?Patient reported hearing voices telling him to "hurry up and do it - kill myself. But I will not listen to them. Patient safe on unit no issue at this time. ?

## 2021-09-07 NOTE — Progress Notes (Signed)
Recreation Therapy Notes ? ? ?Date: 09/07/2021 ? ?Time: 10:55 am    ? ?Location: Craft room   ? ?Behavioral response: N/A ?  ?Intervention Topic: Stress Management  ? ?Discussion/Intervention: ?Patient refused to attend group.  ? ?Clinical Observations/Feedback:  ?Patient refused to attend group.  ?  ?Jennier Schissler LRT/CTRS ? ? ? ? ? ? ? ?Albertine Lafoy ?09/07/2021 12:22 PM ?

## 2021-09-07 NOTE — Progress Notes (Signed)
Pt calm and pleasant during assessment. Pt denies SI/HI/AVH. Pt observed interacting appropriately with staff and peers on the unit. Pt compliant with medication administration per MD orders. Pt given education, support, and encouragement to be active in his treatment plan. Pt being monitored Q 15 minutes for safety per unit protocol. Pt remains safe on the unit.  ?

## 2021-09-07 NOTE — Group Note (Signed)
[facility] LCSW Group Therapy Note ? ? ?Group Date: 09/07/2021 ?Start Time: 1300 ?End Time: 1400 ? ? ?Type of Therapy/Topic:  Group Therapy:  Emotion Regulation ? ?Participation Level:  Minimal  ? ?Mood: Blunted  ? ?Description of Group:   ? The purpose of this group is to assist patients in learning to regulate negative emotions and experience positive emotions. Patients will be guided to discuss ways in which they have been vulnerable to their negative emotions. These vulnerabilities will be juxtaposed with experiences of positive emotions or situations, and patients challenged to use positive emotions to combat negative ones. Special emphasis will be placed on coping with negative emotions in conflict situations, and patients will process healthy conflict resolution skills. ? ?Therapeutic Goals: ?Patient will identify two positive emotions or experiences to reflect on in order to balance out negative emotions:  ?Patient will label two or more emotions that they find the most difficult to experience:  ?Patient will be able to demonstrate positive conflict resolution skills through discussion or role plays:  ? ?Summary of Patient Progress: ? ? ?Patient was present for the entirety of group session. Patient participated in opening and closing remarks. However, patient did not contribute at all to the topic of discussion despite encouraged participation.  ? ? ? ?Therapeutic Modalities:   ?Cognitive Behavioral Therapy ?Feelings Identification ?Dialectical Behavioral Therapy ? ? ?Keandre W Znya Albino, LCSWA ?

## 2021-09-07 NOTE — Progress Notes (Signed)
[facility] MD Progress Note  09/07/2021 2:50 PM Nicholas Nielsen  MRN:  [id number] Subjective: Follow-up patient with psychosis.  Patient is complaining of constipation.  Has not been able to have a bowel movement in a couple days.  I will add Senokot 1 time and Colace standing.  Patient reports though hallucinations are decreased but he still stays in bed with a pillow over his head pretty much all the time.  We are cross titrating onto the olanzapine and off of the Risperdal and I will continue with that plan.  Patient encouraged to come out and be interactive when possible. Principal Problem: Schizo affective schizophrenia ([place]) Diagnosis: Principal Problem:   Schizo affective schizophrenia ([place])  Total Time spent with patient: 30 minutes  Past Psychiatric History: History of fairly recent onset severe psychotic symptoms  Past Medical History:  Past Medical History:  Diagnosis Date   Back pain    Bipolar 1 disorder ([place])    Hepatitis C    Kidney stones    Schizo affective schizophrenia [facility])    Testicular cyst    History reviewed. No pertinent surgical history. Family History:  Family History  Problem Relation Age of Onset   Stroke Father    Hypertension Father    Family Psychiatric  History: See previous Social History:  Social History   Substance and Sexual Activity  Alcohol Use Not Currently     Social History   Substance and Sexual Activity  Drug Use Yes   Frequency: 7.0 times per week   Types: Marijuana   Comment: Daily use; last use is 03/26/2019    Social History   Socioeconomic History   Marital status: Single    Spouse name: Not on file   Number of children: Not on file   Years of education: Not on file   Highest education level: Not on file  Occupational History   Occupation: Unemployed  Tobacco Use   Smoking status: Every Day    Packs/day: 1.00    Years: 15.00    Pack years: 15.00    Types: Cigarettes   Smokeless tobacco: Never  Vaping Use   Vaping  Use: Never used  Substance and Sexual Activity   Alcohol use: Not Currently   Drug use: Yes    Frequency: 7.0 times per week    Types: Marijuana    Comment: Daily use; last use is 03/26/2019   Sexual activity: Never    Birth control/protection: Condom  Other Topics Concern   Not on file  Social History Narrative   Pt stated that he is currently living with his brother.  He lives in [place] and is unemployed.  Pt stated that he recently arranged an appointment with [facility].   Social Determinants of Health   Financial Resource Strain: Not on file  Food Insecurity: Not on file  Transportation Needs: Not on file  Physical Activity: Not on file  Stress: Not on file  Social Connections: Not on file   Additional Social History:                         Sleep: Fair  Appetite:  Fair  Current Medications: Current Facility-Administered Medications  Medication Dose Route Frequency Provider Last Rate Last Admin   acetaminophen (TYLENOL) tablet 650 mg  650 mg Oral Q6H PRN Sherlon Handing, NP   650 mg at 09/01/21 0758   alum & mag hydroxide-simeth (MAALOX/MYLANTA) 200-200-20 MG/5ML suspension 30 mL  30 mL Oral  Q4H PRN Waldon Merl F, NP       amLODipine (NORVASC) tablet 5 mg  5 mg Oral BID Waldon Merl F, NP   5 mg at 09/07/21 0820   benztropine (COGENTIN) tablet 1 mg  1 mg Oral BID Waldon Merl F, NP   1 mg at 09/07/21 0820   docusate sodium (COLACE) capsule 100 mg  100 mg Oral BID Katerine Morua, Madie Reno, MD       hydrOXYzine (ATARAX) tablet 50 mg  50 mg Oral TID PRN Sherlon Handing, NP   50 mg at 09/07/21 [id number]   lithium carbonate (LITHOBID) CR tablet 600 mg  600 mg Oral Q12H Waldon Merl F, NP   600 mg at 09/07/21 0820   magnesium hydroxide (MILK OF MAGNESIA) suspension 30 mL  30 mL Oral Daily PRN Waldon Merl F, NP   30 mL at 09/06/21 0847   nicotine (NICODERM CQ - dosed in mg/24 hours) patch 14 mg  14 mg Transdermal Daily Waldon Merl F, NP   14 mg at  09/07/21 [id number]   nicotine polacrilex (NICORETTE) gum 2 mg  2 mg Oral PRN Sherlon Handing, NP       OLANZapine (ZYPREXA) tablet 15 mg  15 mg Oral QHS Shanetta Nicolls T, MD   15 mg at 09/06/21 2113   pantoprazole (PROTONIX) EC tablet 40 mg  40 mg Oral Daily Ryleah Miramontes, Madie Reno, MD   40 mg at 09/07/21 [id number]   propranolol (INDERAL) tablet 20 mg  20 mg Oral TID Danity Schmelzer T, MD   20 mg at 09/07/21 1216   risperiDONE (RISPERDAL) tablet 6 mg  6 mg Oral QHS Waldon Merl F, NP   6 mg at 09/06/21 2113   senna-docusate (Senokot-S) tablet 2 tablet  2 tablet Oral Once Florentina Marquart T, MD       temazepam (RESTORIL) capsule 15 mg  15 mg Oral QHS PRN Waldon Merl F, NP   15 mg at 09/06/21 2114   ziprasidone (GEODON) injection 20 mg  20 mg Intramuscular Q12H PRN Darneshia Demary, Madie Reno, MD   20 mg at 09/05/21 [id number]    Lab Results: No results found for this or any previous visit (from the past 48 hour(s)).  Blood Alcohol level:  Lab Results  Component Value Date   ETH <10 08/30/2021   ETH <10 [id number]    Metabolic Disorder Labs: Lab Results  Component Value Date   HGBA1C 5.2 08/31/2021   MPG 103 08/31/2021   MPG 96.8 05/05/2021   No results found for: PROLACTIN Lab Results  Component Value Date   CHOL 207 (H) 08/31/2021   TRIG 265 (H) 08/31/2021   HDL 31 (L) 08/31/2021   CHOLHDL 6.7 08/31/2021   VLDL 53 (H) 08/31/2021   LDLCALC 123 (H) 08/31/2021   LDLCALC 150 (H) 05/05/2021    Physical Findings: AIMS:  , ,  ,  ,    CIWA:    COWS:     Musculoskeletal: Strength & Muscle Tone: within normal limits Gait & Station: normal Patient leans: N/A  Psychiatric Specialty Exam:  Presentation  General Appearance: Appropriate for Environment  Eye Contact:Good  Speech:Clear and Coherent  Speech Volume:Normal  Handedness:Right   Mood and Affect  Mood:Depressed  Affect:Tearful; Congruent   Thought Process  Thought Processes:Coherent  Descriptions of  Associations:Intact  Orientation:Full (Time, Place and Person)  Thought Content:WDL  History of Schizophrenia/Schizoaffective disorder:No  Duration of Psychotic Symptoms:Less than six months  Hallucinations:No data recorded Ideas of  Reference:Paranoia; Delusions  Suicidal Thoughts:No data recorded Homicidal Thoughts:No data recorded  Sensorium  Memory:Immediate Good  Judgment:Fair  Insight:Good   Executive Functions  Concentration:Fair  Attention Span:Good  [place]  Language:Good   Psychomotor Activity  Psychomotor Activity:No data recorded  Assets  Assets:Desire for Improvement; Armed forces logistics/support/administrative officer; Financial Resources/Insurance; Housing; Resilience; Physical Health   Sleep  Sleep:No data recorded   Physical Exam: Physical Exam Constitutional:      Appearance: Normal appearance.  HENT:     Head: Normocephalic and atraumatic.     Mouth/Throat:     Pharynx: Oropharynx is clear.  Eyes:     Pupils: Pupils are equal, round, and reactive to light.  Cardiovascular:     Rate and Rhythm: Normal rate and regular rhythm.  Pulmonary:     Effort: Pulmonary effort is normal.     Breath sounds: Normal breath sounds.  Abdominal:     General: Abdomen is flat.     Palpations: Abdomen is soft.  Musculoskeletal:        General: Normal range of motion.  Skin:    General: Skin is warm and dry.  Neurological:     General: No focal deficit present.     Mental Status: He is alert. Mental status is at baseline.  Psychiatric:        Attention and Perception: He is inattentive. He perceives auditory hallucinations.        Mood and Affect: Mood normal. Affect is blunt.        Thought Content: Thought content normal.   Review of Systems  Constitutional: Negative.   HENT: Negative.    Eyes: Negative.   Respiratory: Negative.    Cardiovascular: Negative.   Gastrointestinal:  Positive for constipation.  Musculoskeletal: Negative.    Skin: Negative.   Neurological: Negative.   Psychiatric/Behavioral:  Positive for hallucinations. The patient is nervous/anxious and has insomnia.   Blood pressure 108/78, pulse 85, temperature 97.8 F (36.6 C), temperature source Oral, resp. rate 18, height 6\' 1"  (1.854 m), weight 67.6 kg, SpO2 99 %. Body mass index is 19.66 kg/m.   Treatment Plan Summary: Plan continue increasing olanzapine while decreasing Risperdal.  Psychoeducation and encouragement to patient.  No change to lithium  Alethia Berthold, MD 09/07/2021, 2:50 PM

## 2021-09-07 NOTE — Plan of Care (Signed)
Patient stated that the voices are getting better but he is still anxious around people that's why he does not want to go to groups. Patient attended group with prompting. Vistaril x 1 given with good result. Denies SI,HI and VH. Appetite and energy level good. ADLs maintained. Support and encouragement given. ?

## 2021-09-08 DIAGNOSIS — F259 Schizoaffective disorder, unspecified: Secondary | ICD-10-CM | POA: Diagnosis not present

## 2021-09-08 MED ORDER — OLANZAPINE 10 MG PO TABS
20.0000 mg | ORAL_TABLET | Freq: Every day | ORAL | Status: DC
  Administered 2021-09-08: 21:00:00 20 mg via ORAL
  Filled 2021-09-08: qty 2

## 2021-09-08 MED ORDER — RISPERIDONE 1 MG PO TABS
3.0000 mg | ORAL_TABLET | Freq: Every day | ORAL | Status: DC
  Administered 2021-09-08: 21:00:00 3 mg via ORAL
  Filled 2021-09-08: qty 3

## 2021-09-08 NOTE — Group Note (Signed)
LCSW Group Therapy Note ? ?Group Date: 09/08/2021 ?Start Time: 1300 ?End Time: 1400 ? ? ?Type of Therapy and Topic:  Group Therapy - How To Cope with Nervousness about Discharge  ? ?Participation Level:  Minimal  ? ?Description of Group ?This process group involved identification of patients' feelings about discharge. Some of them are scheduled to be discharged soon, while others are new admissions, but each of them was asked to share thoughts and feelings surrounding discharge from the hospital. One common theme was that they are excited at the prospect of going home, while another was that many of them are apprehensive about sharing why they were hospitalized. Patients were given the opportunity to discuss these feelings with their peers in preparation for discharge. ? ?Therapeutic Goals ? ?Patient will identify their overall feelings about pending discharge. ?Patient will think about how they might proactively address issues that they believe will once again arise once they get home (i.e. with parents). ?Patients will participate in discussion about having hope for change. ? ? ?Summary of Patient Progress:  Patient was present for the entirety of group session. Patient participated in opening and closing remarks. However, patient did not contribute at all to the topic of discussion despite encouraged participation.  ? ? ?Therapeutic Modalities ?Cognitive Behavioral Therapy ? ? ?Durenda Hurt, LCSWA ?09/08/2021  2:18 PM   ?

## 2021-09-08 NOTE — Progress Notes (Signed)
D: Pt awake and alert. Present on the unit for meals. Denies SI/HI/AVH anxiety, depression and pain.  ?A: Pt provided support and encouragement. Pt given medication per protocol and standing orders. Q96m safety checks implemented and continued.  ?R: Isolates except for meals. Pt safe on the unit. Will continue to monitor.  ?

## 2021-09-08 NOTE — Progress Notes (Signed)
[facility] MD Progress Note  09/08/2021 11:22 AM Nicholas Nielsen  MRN:  [id number] Subjective: Follow-up 41 year old man with psychotic symptoms.  Patient seen and chart reviewed.  As usual I found him awake in his room but lying down in bed with lots of paper shoved in his ears.  He did not have the pillow over his head today so maybe that shows a little progress.  As usual he will describe his symptoms as being a little better today but admits the voices still happen.  He was able to pay attention and engage in conversation without obviously responding to internal stimuli or getting angry and has not had any disruptive behavior in the last day. Principal Problem: Schizo affective schizophrenia ([place]) Diagnosis: Principal Problem:   Schizo affective schizophrenia ([place])  Total Time spent with patient: 30 minutes  Past Psychiatric History: Past history of psychotic symptoms that seem to take a while to get better in the hospital and then return quickly if he stops his medicine  Past Medical History:  Past Medical History:  Diagnosis Date   Back pain    Bipolar 1 disorder ([place])    Hepatitis C    Kidney stones    Schizo affective schizophrenia [facility])    Testicular cyst    History reviewed. No pertinent surgical history. Family History:  Family History  Problem Relation Age of Onset   Stroke Father    Hypertension Father    Family Psychiatric  History: See previous Social History:  Social History   Substance and Sexual Activity  Alcohol Use Not Currently     Social History   Substance and Sexual Activity  Drug Use Yes   Frequency: 7.0 times per week   Types: Marijuana   Comment: Daily use; last use is 03/26/2019    Social History   Socioeconomic History   Marital status: Single    Spouse name: Not on file   Number of children: Not on file   Years of education: Not on file   Highest education level: Not on file  Occupational History   Occupation: Unemployed  Tobacco Use    Smoking status: Every Day    Packs/day: 1.00    Years: 15.00    Pack years: 15.00    Types: Cigarettes   Smokeless tobacco: Never  Vaping Use   Vaping Use: Never used  Substance and Sexual Activity   Alcohol use: Not Currently   Drug use: Yes    Frequency: 7.0 times per week    Types: Marijuana    Comment: Daily use; last use is 03/26/2019   Sexual activity: Never    Birth control/protection: Condom  Other Topics Concern   Not on file  Social History Narrative   Pt stated that he is currently living with his brother.  He lives in [place] and is unemployed.  Pt stated that he recently arranged an appointment with [facility].   Social Determinants of Health   Financial Resource Strain: Not on file  Food Insecurity: Not on file  Transportation Needs: Not on file  Physical Activity: Not on file  Stress: Not on file  Social Connections: Not on file   Additional Social History:                         Sleep: Fair  Appetite:  Fair  Current Medications: Current Facility-Administered Medications  Medication Dose Route Frequency Provider Last Rate Last Admin   acetaminophen (TYLENOL) tablet 650 mg  650 mg Oral Q6H PRN Waldon Merl F, NP   650 mg at 09/01/21 0758   alum & mag hydroxide-simeth (MAALOX/MYLANTA) 200-200-20 MG/5ML suspension 30 mL  30 mL Oral Q4H PRN Waldon Merl F, NP       amLODipine (NORVASC) tablet 5 mg  5 mg Oral BID Waldon Merl F, NP   5 mg at 09/08/21 [id number]   benztropine (COGENTIN) tablet 1 mg  1 mg Oral BID Waldon Merl F, NP   1 mg at 09/08/21 [id number]   docusate sodium (COLACE) capsule 100 mg  100 mg Oral BID Christain Mcraney T, MD   100 mg at 09/08/21 0829   hydrOXYzine (ATARAX) tablet 50 mg  50 mg Oral TID PRN Sherlon Handing, NP   50 mg at 09/07/21 [id number]   lithium carbonate (LITHOBID) CR tablet 600 mg  600 mg Oral Q12H Waldon Merl F, NP   600 mg at 09/08/21 [id number]   magnesium hydroxide (MILK OF MAGNESIA) suspension 30 mL  30 mL Oral  Daily PRN Waldon Merl F, NP   30 mL at 09/06/21 0847   nicotine (NICODERM CQ - dosed in mg/24 hours) patch 14 mg  14 mg Transdermal Daily Waldon Merl F, NP   14 mg at 09/08/21 [id number]   nicotine polacrilex (NICORETTE) gum 2 mg  2 mg Oral PRN Sherlon Handing, NP       OLANZapine (ZYPREXA) tablet 20 mg  20 mg Oral QHS Emonnie Cannady T, MD       pantoprazole (PROTONIX) EC tablet 40 mg  40 mg Oral Daily Aubrii Sharpless, Madie Reno, MD   40 mg at 09/08/21 0831   propranolol (INDERAL) tablet 20 mg  20 mg Oral TID Darril Patriarca T, MD   20 mg at 09/08/21 [id number]   risperiDONE (RISPERDAL) tablet 3 mg  3 mg Oral QHS Keltie Labell T, MD       senna-docusate (Senokot-S) tablet 2 tablet  2 tablet Oral Once Mayjor Ager T, MD       temazepam (RESTORIL) capsule 15 mg  15 mg Oral QHS PRN Waldon Merl F, NP   15 mg at 09/07/21 2121   ziprasidone (GEODON) injection 20 mg  20 mg Intramuscular Q12H PRN Waverly Chavarria, Madie Reno, MD   20 mg at 09/05/21 [id number]    Lab Results: No results found for this or any previous visit (from the past 48 hour(s)).  Blood Alcohol level:  Lab Results  Component Value Date   ETH <10 08/30/2021   ETH <10 [id number]    Metabolic Disorder Labs: Lab Results  Component Value Date   HGBA1C 5.2 08/31/2021   MPG 103 08/31/2021   MPG 96.8 05/05/2021   No results found for: PROLACTIN Lab Results  Component Value Date   CHOL 207 (H) 08/31/2021   TRIG 265 (H) 08/31/2021   HDL 31 (L) 08/31/2021   CHOLHDL 6.7 08/31/2021   VLDL 53 (H) 08/31/2021   LDLCALC 123 (H) 08/31/2021   LDLCALC 150 (H) 05/05/2021    Physical Findings: AIMS:  , ,  ,  ,    CIWA:    COWS:     Musculoskeletal: Strength & Muscle Tone: within normal limits Gait & Station: normal Patient leans: N/A  Psychiatric Specialty Exam:  Presentation  General Appearance: Appropriate for Environment  Eye Contact:Good  Speech:Clear and Coherent  Speech Volume:Normal  Handedness:Right   Mood and Affect   Mood:Depressed  Affect:Tearful; Congruent   Thought Process  Thought Processes:Coherent  Descriptions of  Associations:Intact  Orientation:Full (Time, Place and Person)  Thought Content:WDL  History of Schizophrenia/Schizoaffective disorder:No  Duration of Psychotic Symptoms:Less than six months  Hallucinations:No data recorded Ideas of Reference:Paranoia; Delusions  Suicidal Thoughts:No data recorded Homicidal Thoughts:No data recorded  Sensorium  Memory:Immediate Good  Judgment:Fair  Insight:Good   Executive Functions  Concentration:Fair  Attention Span:Good  [place]  Language:Good   Psychomotor Activity  Psychomotor Activity:No data recorded  Assets  Assets:Desire for Improvement; Armed forces logistics/support/administrative officer; Financial Resources/Insurance; Housing; Resilience; Physical Health   Sleep  Sleep:No data recorded   Physical Exam: Physical Exam Vitals and nursing note reviewed.  Constitutional:      Appearance: Normal appearance.  HENT:     Head: Normocephalic and atraumatic.     Mouth/Throat:     Pharynx: Oropharynx is clear.  Eyes:     Pupils: Pupils are equal, round, and reactive to light.  Cardiovascular:     Rate and Rhythm: Normal rate and regular rhythm.  Pulmonary:     Effort: Pulmonary effort is normal.     Breath sounds: Normal breath sounds.  Abdominal:     General: Abdomen is flat.     Palpations: Abdomen is soft.  Musculoskeletal:        General: Normal range of motion.  Skin:    General: Skin is warm and dry.  Neurological:     General: No focal deficit present.     Mental Status: He is alert. Mental status is at baseline.  Psychiatric:        Attention and Perception: He is inattentive. He perceives auditory hallucinations.        Mood and Affect: Mood normal. Affect is blunt.        Speech: Speech is delayed.        Behavior: Behavior is slowed.        Thought Content: Thought content is paranoid.         Cognition and Memory: Cognition normal.        Judgment: Judgment normal.   Review of Systems  Constitutional: Negative.   HENT: Negative.    Eyes: Negative.   Respiratory: Negative.    Cardiovascular: Negative.   Gastrointestinal: Negative.   Musculoskeletal: Negative.   Skin: Negative.   Neurological: Negative.   Psychiatric/Behavioral:  Positive for hallucinations. Negative for depression, substance abuse and suicidal ideas. The patient is nervous/anxious.   Blood pressure 96/69, pulse 79, temperature 97.8 F (36.6 C), temperature source Oral, resp. rate 18, height 6\' 1"  (1.854 m), weight 67.6 kg, SpO2 99 %. Body mass index is 19.66 kg/m.   Treatment Plan Summary: Medication management and Plan I am continuing my intention to taper him onto olanzapine and off of Risperdal.  Increase olanzapine to 20 mg at night decrease Risperdal to just 3 mg.  Lots of praise for his efforts to communicate with staff.  Encourage group attendance.  Patient agreeable to current plan. I should mention however he is still obviously hallucinating and still clearly too psychotic and dysfunctional to be safely discharged home where I am sure he would almost immediately relapse  Alethia Berthold, MD 09/08/2021, 11:22 AM

## 2021-09-08 NOTE — Progress Notes (Signed)
Recreation Therapy Notes ? ?Date: 09/08/2021 ?  ?Time: 10:15 am    ?  ?Location: Courtyard  ?  ?Behavioral response: N/A ?  ?Intervention Topic: Social skills ? ?Discussion/Intervention: ?Patient refused to attend group.  ?  ?Clinical Observations/Feedback:  ?Patient refused to attend group.  ?  ?Cleo Villamizar LRT/CTRS ? ? ? ? ? ? ? ?Averly Ericson ?09/08/2021 12:16 PM ?

## 2021-09-08 NOTE — Plan of Care (Signed)
  Problem: Group Participation Goal: STG - Patient will engage in groups without prompting or encouragement from LRT x3 group sessions within 5 recreation therapy group sessions Description: STG - Patient will engage in groups without prompting or encouragement from LRT x3 group sessions within 5 recreation therapy group sessions Outcome: Not Progressing   

## 2021-09-08 NOTE — Plan of Care (Signed)
  Problem: Education: Goal: Knowledge of [facility] Education information/materials will improve Outcome: Progressing Goal: Emotional status will improve Outcome: Progressing Goal: Mental status will improve Outcome: Progressing Goal: Verbalization of understanding the information provided will improve Outcome: Progressing   Problem: Safety: Goal: Periods of time without injury will increase Outcome: Progressing   Problem: Education: Goal: Will be free of psychotic symptoms Outcome: Progressing Goal: Knowledge of the prescribed therapeutic regimen will improve Outcome: Progressing   Problem: Safety: Goal: Ability to redirect hostility and anger into socially appropriate behaviors will improve Outcome: Progressing Goal: Ability to remain free from injury will improve Outcome: Progressing   

## 2021-09-09 DIAGNOSIS — F259 Schizoaffective disorder, unspecified: Secondary | ICD-10-CM | POA: Diagnosis not present

## 2021-09-09 MED ORDER — OLANZAPINE 5 MG PO TABS
25.0000 mg | ORAL_TABLET | Freq: Every day | ORAL | Status: DC
  Administered 2021-09-09: 25 mg via ORAL
  Filled 2021-09-09: qty 1

## 2021-09-09 MED ORDER — RISPERIDONE 1 MG PO TABS
2.0000 mg | ORAL_TABLET | Freq: Every day | ORAL | Status: DC
  Administered 2021-09-09: 2 mg via ORAL
  Filled 2021-09-09: qty 2

## 2021-09-09 NOTE — Progress Notes (Signed)
Recreation Therapy Notes ? ? ?Date: 09/09/2021 ? ?Time: 10:50 am   ?  ?Location: Craft room   ? ?Behavioral response: N/A ?  ?Intervention Topic: Life planning   ? ?Discussion/Intervention: ?Patient did not attend group. ?  ?Clinical Observations/Feedback:  ?Patient did not attend group. ?  ?Maurita Havener LRT/CTRS ? ? ? ? ? ? ? ?Gianno Volner ?09/09/2021 11:42 AM ?

## 2021-09-09 NOTE — Progress Notes (Signed)
D: Pt alert and oriented. Denies SI/HI/AVH, pain and depression . Rates anxiety 8/10.  ?Present on the unit for meals. Does not participate in groups.  ? ?A: Scheduled medications administered to pt, per MD orders. Support and encouragement provided. Frequent verbal contact made. Routine safety checks conducted q15 minutes.  ? ?R: No adverse drug reactions noted.  Pt complaint with medications and treatment plan. Pt isolates but is appropriate during interaction with others.. Pt remains safe at this time. Will continue to monitor.    ?

## 2021-09-09 NOTE — Progress Notes (Signed)
[facility] MD Progress Note  09/09/2021 5:03 PM QUARAN QUERTERMOUS  MRN:  [id number] Subjective: Follow-up patient with schizophrenia or schizoaffective disorder.  Continues to stay in bed all the time.  Initially says the voices are better but later describes having auditory hallucinations.  Denies suicidal intent.  Denies being aware of side effects of medicine Principal Problem: Schizo affective schizophrenia (HCC) Diagnosis: Principal Problem:   Schizo affective schizophrenia (HCC)  Total Time spent with patient: 30 minutes  Past Psychiatric History: Past history of episodes of psychosis  Past Medical History:  Past Medical History:  Diagnosis Date   Back pain    Bipolar 1 disorder (HCC)    Hepatitis C    Kidney stones    Schizo affective schizophrenia [facility])    Testicular cyst    History reviewed. No pertinent surgical history. Family History:  Family History  Problem Relation Age of Onset   Stroke Father    Hypertension Father    Family Psychiatric  History: See previous Social History:  Social History   Substance and Sexual Activity  Alcohol Use Not Currently     Social History   Substance and Sexual Activity  Drug Use Yes   Frequency: 7.0 times per week   Types: Marijuana   Comment: Daily use; last use is 03/26/2019    Social History   Socioeconomic History   Marital status: Single    Spouse name: Not on file   Number of children: Not on file   Years of education: Not on file   Highest education level: Not on file  Occupational History   Occupation: Unemployed  Tobacco Use   Smoking status: Every Day    Packs/day: 1.00    Years: 15.00    Pack years: 15.00    Types: Cigarettes   Smokeless tobacco: Never  Vaping Use   Vaping Use: Never used  Substance and Sexual Activity   Alcohol use: Not Currently   Drug use: Yes    Frequency: 7.0 times per week    Types: Marijuana    Comment: Daily use; last use is 03/26/2019   Sexual activity: Never    Birth  control/protection: Condom  Other Topics Concern   Not on file  Social History Narrative   Pt stated that he is currently living with his brother.  He lives in [place] and is unemployed.  Pt stated that he recently arranged an appointment with [facility].   Social Determinants of Health   Financial Resource Strain: Not on file  Food Insecurity: Not on file  Transportation Needs: Not on file  Physical Activity: Not on file  Stress: Not on file  Social Connections: Not on file   Additional Social History:                         Sleep: Fair  Appetite:  Fair  Current Medications: Current Facility-Administered Medications  Medication Dose Route Frequency Provider Last Rate Last Admin   acetaminophen (TYLENOL) tablet 650 mg  650 mg Oral Q6H PRN Gabriel Cirri F, NP   650 mg at 09/08/21 2055   alum & mag hydroxide-simeth (MAALOX/MYLANTA) 200-200-20 MG/5ML suspension 30 mL  30 mL Oral Q4H PRN Gabriel Cirri F, NP       amLODipine (NORVASC) tablet 5 mg  5 mg Oral BID Gabriel Cirri F, NP   5 mg at 09/09/21 0743   benztropine (COGENTIN) tablet 1 mg  1 mg Oral BID Vanetta Mulders, NP  1 mg at 09/09/21 0743   docusate sodium (COLACE) capsule 100 mg  100 mg Oral BID Vidyuth Belsito T, MD   100 mg at 09/08/21 1650   hydrOXYzine (ATARAX) tablet 50 mg  50 mg Oral TID PRN Vanetta Mulders, NP   50 mg at 09/07/21 4098   lithium carbonate (LITHOBID) CR tablet 600 mg  600 mg Oral Q12H Gabriel Cirri F, NP   600 mg at 09/09/21 0743   magnesium hydroxide (MILK OF MAGNESIA) suspension 30 mL  30 mL Oral Daily PRN Gabriel Cirri F, NP   30 mL at 09/06/21 0847   nicotine (NICODERM CQ - dosed in mg/24 hours) patch 14 mg  14 mg Transdermal Daily Gabriel Cirri F, NP   14 mg at 09/09/21 0744   nicotine polacrilex (NICORETTE) gum 2 mg  2 mg Oral PRN Vanetta Mulders, NP       OLANZapine (ZYPREXA) tablet 20 mg  20 mg Oral QHS Lolah Coghlan T, MD   20 mg at 09/08/21 2102    pantoprazole (PROTONIX) EC tablet 40 mg  40 mg Oral Daily Alani Sabbagh, Jackquline Denmark, MD   40 mg at 09/09/21 0743   propranolol (INDERAL) tablet 20 mg  20 mg Oral TID Grey Schlauch T, MD   20 mg at 09/09/21 1157   risperiDONE (RISPERDAL) tablet 3 mg  3 mg Oral QHS Jermika Olden T, MD   3 mg at 09/08/21 2101   senna-docusate (Senokot-S) tablet 2 tablet  2 tablet Oral Once Toshika Parrow T, MD       temazepam (RESTORIL) capsule 15 mg  15 mg Oral QHS PRN Gabriel Cirri F, NP   15 mg at 09/07/21 2121   ziprasidone (GEODON) injection 20 mg  20 mg Intramuscular Q12H PRN Emad Brechtel, Jackquline Denmark, MD   20 mg at 09/05/21 1191    Lab Results: No results found for this or any previous visit (from the past 48 hour(s)).  Blood Alcohol level:  Lab Results  Component Value Date   ETH <10 08/30/2021   ETH <10 03/26/2019    Metabolic Disorder Labs: Lab Results  Component Value Date   HGBA1C 5.2 08/31/2021   MPG 103 08/31/2021   MPG 96.8 05/05/2021   No results found for: PROLACTIN Lab Results  Component Value Date   CHOL 207 (H) 08/31/2021   TRIG 265 (H) 08/31/2021   HDL 31 (L) 08/31/2021   CHOLHDL 6.7 08/31/2021   VLDL 53 (H) 08/31/2021   LDLCALC 123 (H) 08/31/2021   LDLCALC 150 (H) 05/05/2021    Physical Findings: AIMS:  , ,  ,  ,    CIWA:    COWS:     Musculoskeletal: Strength & Muscle Tone: within normal limits Gait & Station: normal Patient leans: N/A  Psychiatric Specialty Exam:  Presentation  General Appearance: Appropriate for Environment  Eye Contact:Good  Speech:Clear and Coherent  Speech Volume:Normal  Handedness:Right   Mood and Affect  Mood:Depressed  Affect:Tearful; Congruent   Thought Process  Thought Processes:Coherent  Descriptions of Associations:Intact  Orientation:Full (Time, Place and Person)  Thought Content:WDL  History of Schizophrenia/Schizoaffective disorder:No  Duration of Psychotic Symptoms:Less than six months  Hallucinations:No data  recorded Ideas of Reference:Paranoia; Delusions  Suicidal Thoughts:No data recorded Homicidal Thoughts:No data recorded  Sensorium  Memory:Immediate Good  Judgment:Fair  Insight:Good   Executive Functions  Concentration:Fair  Attention Span:Good  Recall:Good  Fund of Knowledge:Good  Language:Good   Psychomotor Activity  Psychomotor Activity:No data recorded  Assets  Assets:Desire for Improvement; Communication Skills; Financial Resources/Insurance; Housing; Resilience; Physical Health   Sleep  Sleep:No data recorded   Physical Exam: Physical Exam Vitals and nursing note reviewed.  Constitutional:      Appearance: Normal appearance.  HENT:     Head: Normocephalic and atraumatic.     Mouth/Throat:     Pharynx: Oropharynx is clear.  Eyes:     Pupils: Pupils are equal, round, and reactive to light.  Cardiovascular:     Rate and Rhythm: Normal rate and regular rhythm.  Pulmonary:     Effort: Pulmonary effort is normal.     Breath sounds: Normal breath sounds.  Abdominal:     General: Abdomen is flat.     Palpations: Abdomen is soft.  Musculoskeletal:        General: Normal range of motion.  Skin:    General: Skin is warm and dry.  Neurological:     General: No focal deficit present.     Mental Status: He is alert. Mental status is at baseline.  Psychiatric:        Attention and Perception: He is inattentive. He perceives auditory hallucinations.        Mood and Affect: Mood normal. Affect is blunt.        Speech: Speech is delayed.        Behavior: Behavior is slowed.        Thought Content: Thought content normal.        Cognition and Memory: Cognition is impaired.   Review of Systems  Constitutional: Negative.   HENT: Negative.    Eyes: Negative.   Respiratory: Negative.    Cardiovascular: Negative.   Gastrointestinal: Negative.   Musculoskeletal: Negative.   Skin: Negative.   Neurological: Negative.   Psychiatric/Behavioral:  Positive for  hallucinations.   Blood pressure 101/71, pulse 68, temperature 98.2 F (36.8 C), temperature source Oral, resp. rate 18, height 6\' 1"  (1.854 m), weight 67.6 kg, SpO2 100 %. Body mass index is 19.66 kg/m.   Treatment Plan Summary: Medication management and Plan continue plan to increase olanzapine while tapering Risperdal.  Encourage patient to be more active on the unit.  Mordecai Rasmussen, MD 09/09/2021, 5:03 PM

## 2021-09-09 NOTE — Group Note (Signed)
LCSW Group Therapy Note ? ? ?Group Date: 09/09/2021 ?Start Time: 1300 ?End Time: 1400 ? ? ?Type of Therapy and Topic:  Group Therapy: Boundaries ? ?Participation Level:  Active ? ?Description of Group: ?This group will address the use of boundaries in their personal lives. Patients will explore why boundaries are important, the difference between healthy and unhealthy boundaries, and negative and postive outcomes of different boundaries and will look at how boundaries can be crossed.  Patients will be encouraged to identify current boundaries in their own lives and identify what kind of boundary is being set. Facilitators will guide patients in utilizing problem-solving interventions to address and correct types boundaries being used and to address when no boundary is being used. Understanding and applying boundaries will be explored and addressed for obtaining and maintaining a balanced life. Patients will be encouraged to explore ways to assertively make their boundaries and needs known to significant others in their lives, using other group members and facilitator for role play, support, and feedback. ? ?Therapeutic Goals: ? ?1.  Patient will identify areas in their life where setting clear boundaries could be  used to improve their life.  ?2.  Patient will identify signs/triggers that a boundary is not being respected. ?3.  Patient will identify two ways to set boundaries in order to achieve balance in  their lives: ?4.  Patient will demonstrate ability to communicate their needs and set boundaries  through discussion and/or role plays ? ?Summary of Patient Progress:  Patient was present for the entirety of the group session. Patient was an active listener and participated in the topic of discussion, provided helpful advice to others, and added nuance to topic of conversation. Patient state that he has difficulty trusting in others due to a history of others betraying him. Group discussed the benefits and cons of  putting up excessive boundaries with others.  ? ?Therapeutic Modalities:   ?Cognitive Behavioral Therapy ?Solution-Focused Therapy ? ?Corky Crafts, LCSWA ?09/09/2021  3:00 PM   ? ?

## 2021-09-09 NOTE — Progress Notes (Signed)
Patient is pleasant and cooperative. He has been isolative to his room this evening. He is med compliant and received his medication without incident.  He denies si hi avh depression anxiety and pain at this encounter.   ? ? ?C. Mandato-Nicholson, LPN ? ?

## 2021-09-09 NOTE — Plan of Care (Signed)
  Problem: Education: Goal: Knowledge of [facility] Education information/materials will improve Outcome: Progressing Goal: Emotional status will improve Outcome: Progressing Goal: Mental status will improve Outcome: Progressing Goal: Verbalization of understanding the information provided will improve Outcome: Progressing   Problem: Safety: Goal: Periods of time without injury will increase Outcome: Progressing   Problem: Education: Goal: Will be free of psychotic symptoms Outcome: Progressing Goal: Knowledge of the prescribed therapeutic regimen will improve Outcome: Progressing   Problem: Safety: Goal: Ability to redirect hostility and anger into socially appropriate behaviors will improve Outcome: Progressing Goal: Ability to remain free from injury will improve Outcome: Progressing   

## 2021-09-10 DIAGNOSIS — F259 Schizoaffective disorder, unspecified: Secondary | ICD-10-CM | POA: Diagnosis not present

## 2021-09-10 MED ORDER — OLANZAPINE 10 MG PO TABS
30.0000 mg | ORAL_TABLET | Freq: Every day | ORAL | Status: DC
  Administered 2021-09-10 – 2021-09-11 (×2): 30 mg via ORAL
  Filled 2021-09-10 (×2): qty 3

## 2021-09-10 MED ORDER — BENZTROPINE MESYLATE 1 MG PO TABS
1.0000 mg | ORAL_TABLET | Freq: Every day | ORAL | Status: DC
  Administered 2021-09-11: 1 mg via ORAL
  Filled 2021-09-10: qty 1

## 2021-09-10 NOTE — Plan of Care (Signed)
?  Problem: Education: °Goal: Knowledge of [facility] Education information/materials will improve °Outcome: Progressing °Goal: Emotional status will improve °Outcome: Progressing °Goal: Mental status will improve °Outcome: Progressing °Goal: Verbalization of understanding the information provided will improve °Outcome: Progressing °  °Problem: Safety: °Goal: Periods of time without injury will increase °Outcome: Progressing °  °Problem: Education: °Goal: Will be free of psychotic symptoms °Outcome: Progressing °Goal: Knowledge of the prescribed therapeutic regimen will improve °Outcome: Progressing °  °

## 2021-09-10 NOTE — Progress Notes (Signed)
[facility] MD Progress Note  09/10/2021 11:22 AM Nicholas Nielsen  MRN:  [id number] Subjective: Follow-up for this gentleman with schizoaffective disorder.  Patient seen and chart reviewed.  Patient says he feels like things are gradually getting a little bit better every day.  He says the voices are diminished.  He still does not come out of his room very much but he points out that he is always socially withdrawn even when he is feeling better.  Denies any suicidal thoughts.  No complaints from current medicine profile Principal Problem: Schizo affective schizophrenia ([place]) Diagnosis: Principal Problem:   Schizo affective schizophrenia ([place])  Total Time spent with patient: 30 minutes  Past Psychiatric History: Past history of schizoaffective disorder  Past Medical History:  Past Medical History:  Diagnosis Date   Back pain    Bipolar 1 disorder ([place])    Hepatitis C    Kidney stones    Schizo affective schizophrenia [facility])    Testicular cyst    History reviewed. No pertinent surgical history. Family History:  Family History  Problem Relation Age of Onset   Stroke Father    Hypertension Father    Family Psychiatric  History: See previous Social History:  Social History   Substance and Sexual Activity  Alcohol Use Not Currently     Social History   Substance and Sexual Activity  Drug Use Yes   Frequency: 7.0 times per week   Types: Marijuana   Comment: Daily use; last use is 03/26/2019    Social History   Socioeconomic History   Marital status: Single    Spouse name: Not on file   Number of children: Not on file   Years of education: Not on file   Highest education level: Not on file  Occupational History   Occupation: Unemployed  Tobacco Use   Smoking status: Every Day    Packs/day: 1.00    Years: 15.00    Pack years: 15.00    Types: Cigarettes   Smokeless tobacco: Never  Vaping Use   Vaping Use: Never used  Substance and Sexual Activity   Alcohol use: Not  Currently   Drug use: Yes    Frequency: 7.0 times per week    Types: Marijuana    Comment: Daily use; last use is 03/26/2019   Sexual activity: Never    Birth control/protection: Condom  Other Topics Concern   Not on file  Social History Narrative   Pt stated that he is currently living with his brother.  He lives in [place] and is unemployed.  Pt stated that he recently arranged an appointment with [facility].   Social Determinants of Health   Financial Resource Strain: Not on file  Food Insecurity: Not on file  Transportation Needs: Not on file  Physical Activity: Not on file  Stress: Not on file  Social Connections: Not on file   Additional Social History:                         Sleep: Fair  Appetite:  Fair  Current Medications: Current Facility-Administered Medications  Medication Dose Route Frequency Provider Last Rate Last Admin   acetaminophen (TYLENOL) tablet 650 mg  650 mg Oral Q6H PRN Sherlon Handing, NP   650 mg at 09/08/21 2055   alum & mag hydroxide-simeth (MAALOX/MYLANTA) 200-200-20 MG/5ML suspension 30 mL  30 mL Oral Q4H PRN Waldon Merl F, NP       amLODipine (NORVASC) tablet 5  mg  5 mg Oral BID Waldon Merl F, NP   5 mg at 09/10/21 [id number]   benztropine (COGENTIN) tablet 1 mg  1 mg Oral BID Waldon Merl F, NP   1 mg at 09/10/21 [id number]   docusate sodium (COLACE) capsule 100 mg  100 mg Oral BID Jonanthan Bolender, Madie Reno, MD   100 mg at 09/10/21 0826   hydrOXYzine (ATARAX) tablet 50 mg  50 mg Oral TID PRN Sherlon Handing, NP   50 mg at 09/07/21 [id number]   lithium carbonate (LITHOBID) CR tablet 600 mg  600 mg Oral Q12H Waldon Merl F, NP   600 mg at 09/10/21 [id number]   magnesium hydroxide (MILK OF MAGNESIA) suspension 30 mL  30 mL Oral Daily PRN Waldon Merl F, NP   30 mL at 09/06/21 0847   nicotine (NICODERM CQ - dosed in mg/24 hours) patch 14 mg  14 mg Transdermal Daily Waldon Merl F, NP   14 mg at 09/10/21 0827   nicotine polacrilex  (NICORETTE) gum 2 mg  2 mg Oral PRN Sherlon Handing, NP       OLANZapine (ZYPREXA) tablet 25 mg  25 mg Oral QHS Samyuktha Brau T, MD   25 mg at 09/09/21 2111   pantoprazole (PROTONIX) EC tablet 40 mg  40 mg Oral Daily Ica Daye, Madie Reno, MD   40 mg at 09/10/21 [id number]   propranolol (INDERAL) tablet 20 mg  20 mg Oral TID Vora Clover, Madie Reno, MD   20 mg at 09/10/21 0826   risperiDONE (RISPERDAL) tablet 2 mg  2 mg Oral QHS Jazmina Muhlenkamp T, MD   2 mg at 09/09/21 2111   senna-docusate (Senokot-S) tablet 2 tablet  2 tablet Oral Once Gaither Biehn T, MD       temazepam (RESTORIL) capsule 15 mg  15 mg Oral QHS PRN Waldon Merl F, NP   15 mg at 09/09/21 2111   ziprasidone (GEODON) injection 20 mg  20 mg Intramuscular Q12H PRN Bettyjane Shenoy, Madie Reno, MD   20 mg at 09/05/21 [id number]    Lab Results: No results found for this or any previous visit (from the past 48 hour(s)).  Blood Alcohol level:  Lab Results  Component Value Date   ETH <10 08/30/2021   ETH <10 [id number]    Metabolic Disorder Labs: Lab Results  Component Value Date   HGBA1C 5.2 08/31/2021   MPG 103 08/31/2021   MPG 96.8 05/05/2021   No results found for: PROLACTIN Lab Results  Component Value Date   CHOL 207 (H) 08/31/2021   TRIG 265 (H) 08/31/2021   HDL 31 (L) 08/31/2021   CHOLHDL 6.7 08/31/2021   VLDL 53 (H) 08/31/2021   LDLCALC 123 (H) 08/31/2021   LDLCALC 150 (H) 05/05/2021    Physical Findings: AIMS:  , ,  ,  ,    CIWA:    COWS:     Musculoskeletal: Strength & Muscle Tone: within normal limits Gait & Station: normal Patient leans: N/A  Psychiatric Specialty Exam:  Presentation  General Appearance: Appropriate for Environment  Eye Contact:Good  Speech:Clear and Coherent  Speech Volume:Normal  Handedness:Right   Mood and Affect  Mood:Depressed  Affect:Tearful; Congruent   Thought Process  Thought Processes:Coherent  Descriptions of Associations:Intact  Orientation:Full (Time, Place and  Person)  Thought Content:WDL  History of Schizophrenia/Schizoaffective disorder:No  Duration of Psychotic Symptoms:Less than six months  Hallucinations:No data recorded Ideas of Reference:Paranoia; Delusions  Suicidal Thoughts:No data recorded Homicidal Thoughts:No data recorded  Sensorium  Memory:Immediate Good  Judgment:Fair  Insight:Good   Executive Functions  Concentration:Fair  Attention Span:Good  [place]  Language:Good   Psychomotor Activity  Psychomotor Activity:No data recorded  Assets  Assets:Desire for Improvement; Armed forces logistics/support/administrative officer; Financial Resources/Insurance; Housing; Resilience; Physical Health   Sleep  Sleep:No data recorded   Physical Exam: Physical Exam Vitals and nursing note reviewed.  Constitutional:      Appearance: Normal appearance.  HENT:     Head: Normocephalic and atraumatic.     Mouth/Throat:     Pharynx: Oropharynx is clear.  Eyes:     Pupils: Pupils are equal, round, and reactive to light.  Cardiovascular:     Rate and Rhythm: Normal rate and regular rhythm.  Pulmonary:     Effort: Pulmonary effort is normal.     Breath sounds: Normal breath sounds.  Abdominal:     General: Abdomen is flat.     Palpations: Abdomen is soft.  Musculoskeletal:        General: Normal range of motion.  Skin:    General: Skin is warm and dry.  Neurological:     General: No focal deficit present.     Mental Status: He is alert. Mental status is at baseline.  Psychiatric:        Attention and Perception: Attention normal. He perceives auditory hallucinations.        Mood and Affect: Mood normal. Affect is blunt.        Speech: Speech is delayed.        Behavior: Behavior is slowed.        Thought Content: Thought content normal.        Cognition and Memory: Cognition is impaired.   Review of Systems  Constitutional: Negative.   HENT: Negative.    Eyes: Negative.   Respiratory: Negative.     Cardiovascular: Negative.   Gastrointestinal: Negative.   Musculoskeletal: Negative.   Skin: Negative.   Neurological: Negative.   Psychiatric/Behavioral:  Positive for hallucinations. Negative for depression, substance abuse and suicidal ideas. The patient is not nervous/anxious and does not have insomnia.   Blood pressure 114/82, pulse 70, temperature 97.8 F (36.6 C), temperature source Oral, resp. rate 17, height 6\' 1"  (1.854 m), weight 67.6 kg, SpO2 99 %. Body mass index is 19.66 kg/m.   Treatment Plan Summary: Medication management and Plan continue with completing the plan to switch over to olanzapine from risperidone.  No sign of lithium toxicity and his level was stable previously I am not going to recheck that today.  As always encouraged him to consider getting up out of bed and interacting.  I pointed out to him that the ward is very quiet with a low census right now.  Whether he should be quite pleasant outside this afternoon.  If things continue to be reported as improved we will consider discharge by Monday perhaps  Alethia Berthold, MD 09/10/2021, 11:22 AM

## 2021-09-10 NOTE — Progress Notes (Signed)
Patient is isolative to is room this evening. Came out to get snack and went back to his room. He denies si  hi avh depression and pain. He continues to endorse anxiety but did not ask for any medication to help with symptoms.  Hie is med compliant and received his medications without incident.  Will continue to monitor with q15 minute safety checks. ? ? ? ? ? ?Cleo Harter-Nicholson, LPN ? ? ? ? ? ? ? ? ?

## 2021-09-10 NOTE — Progress Notes (Addendum)
D: Pt up and present for meals on the unit.  Reports sleeping last night as being good. Denies SI/HI/AVH, pain, anxiety and depression ? ? ?A: Scheduled medications administered to pt, per MD orders. Support and encouragement provided. Frequent verbal contact made. Routine safety checks conducted q15 minutes.  ? ?R: No adverse drug reactions noted. Pt complaint with medications and treatment plan. Pt participates in groups but isolates from peers minutes. Pt remains safe at this time. Will continue to monitor.    ?

## 2021-09-10 NOTE — Group Note (Signed)
LCSW Group Therapy Note ? ?Group Date: 09/10/2021 ?Start Time: 1315 ?End Time: 1415 ? ? ?Type of Therapy and Topic:  Group Therapy - Healthy vs Unhealthy Coping Skills ? ?Participation Level:  Active  ? ?Description of Group ?The focus of this group was to determine what unhealthy coping techniques typically are used by group members and what healthy coping techniques would be helpful in coping with various problems. Patients were guided in becoming aware of the differences between healthy and unhealthy coping techniques. Patients were asked to identify 2-3 healthy coping skills they would like to learn to use more effectively. ? ?Therapeutic Goals ?Patients learned that coping is what human beings do all day long to deal with various situations in their lives ?Patients defined and discussed healthy vs unhealthy coping techniques ?Patients identified their preferred coping techniques and identified whether these were healthy or unhealthy ?Patients determined 2-3 healthy coping skills they would like to become more familiar with and use more often. ?Patients provided support and ideas to each other ? ? ?Summary of Patient Progress: Patient was present for the entirety of the group session. Patient was an active listener and participated in the topic of discussion. Patient shared that he used to use drugs on a regular basis but stopped about 8 years ago. Patient recalled the last time he smoked marijuana was about 8 months ago and shared how it made him feel paranoid and hear voices. Patient was receptive to psycho education about the effects of marijuana on psychosis. Patient identified listening to music as the coping strategy that helps him most. Patient was receptive to discussing the supplemental coping skills worksheet during group.  ? ? ? ?Therapeutic Modalities ?Cognitive Behavioral Therapy ?Motivational Interviewing ? ?Ileana Ladd Jasmyne Lodato, LCSWA ?09/10/2021  5:25 PM   ?

## 2021-09-10 NOTE — BH IP Treatment Plan (Signed)
Interdisciplinary Treatment and Diagnostic Plan Update  09/10/2021 Time of Session: 9:00AM Nicholas Nielsen MRN: [id number]  Principal Diagnosis: Schizo affective schizophrenia [facility](HCC)  Secondary Diagnoses: Principal Problem:   Schizo affective schizophrenia (HCC)   Current Medications:  Current Facility-Administered Medications  Medication Dose Route Frequency Provider Last Rate Last Admin   acetaminophen (TYLENOL) tablet 650 mg  650 mg Oral Q6H PRN Gabriel CirriBarthold, Louise F, NP   650 mg at 09/08/21 2055   alum & mag hydroxide-simeth (MAALOX/MYLANTA) 200-200-20 MG/5ML suspension 30 mL  30 mL Oral Q4H PRN Gabriel CirriBarthold, Louise F, NP       amLODipine (NORVASC) tablet 5 mg  5 mg Oral BID Gabriel CirriBarthold, Louise F, NP   5 mg at 09/10/21 0826   benztropine (COGENTIN) tablet 1 mg  1 mg Oral BID Gabriel CirriBarthold, Louise F, NP   1 mg at 09/10/21 [id number]   docusate sodium (COLACE) capsule 100 mg  100 mg Oral BID Clapacs, John T, MD   100 mg at 09/10/21 0826   hydrOXYzine (ATARAX) tablet 50 mg  50 mg Oral TID PRN Vanetta MuldersBarthold, Louise F, NP   50 mg at 09/07/21 0821   lithium carbonate (LITHOBID) CR tablet 600 mg  600 mg Oral Q12H Gabriel CirriBarthold, Louise F, NP   600 mg at 09/10/21 [id number]   magnesium hydroxide (MILK OF MAGNESIA) suspension 30 mL  30 mL Oral Daily PRN Gabriel CirriBarthold, Louise F, NP   30 mL at 09/06/21 0847   nicotine (NICODERM CQ - dosed in mg/24 hours) patch 14 mg  14 mg Transdermal Daily Gabriel CirriBarthold, Louise F, NP   14 mg at 09/10/21 0827   nicotine polacrilex (NICORETTE) gum 2 mg  2 mg Oral PRN Vanetta MuldersBarthold, Louise F, NP       OLANZapine (ZYPREXA) tablet 25 mg  25 mg Oral QHS Clapacs, John T, MD   25 mg at 09/09/21 2111   pantoprazole (PROTONIX) EC tablet 40 mg  40 mg Oral Daily Clapacs, Jackquline DenmarkJohn T, MD   40 mg at 09/10/21 [id number]   propranolol (INDERAL) tablet 20 mg  20 mg Oral TID Clapacs, Jackquline DenmarkJohn T, MD   20 mg at 09/10/21 0826   risperiDONE (RISPERDAL) tablet 2 mg  2 mg Oral QHS Clapacs, John T, MD   2 mg at 09/09/21 2111   senna-docusate  (Senokot-S) tablet 2 tablet  2 tablet Oral Once Clapacs, John T, MD       temazepam (RESTORIL) capsule 15 mg  15 mg Oral QHS PRN Gabriel CirriBarthold, Louise F, NP   15 mg at 09/09/21 2111   ziprasidone (GEODON) injection 20 mg  20 mg Intramuscular Q12H PRN Clapacs, John T, MD   20 mg at 09/05/21 [id number]   PTA Medications: Medications Prior to Admission  Medication Sig Dispense Refill Last Dose   amLODipine (NORVASC) 5 MG tablet Take 1 tablet (5 mg total) by mouth daily. 10 tablet 0    amLODipine (NORVASC) 5 MG tablet Take 1 tablet (5 mg total) by mouth daily. 30 tablet 1    benztropine (COGENTIN) 0.5 MG tablet Take 2 tablets (1 mg total) by mouth 2 (two) times daily. 40 tablet 0    benztropine (COGENTIN) 1 MG tablet Take 1 tablet (1 mg total) by mouth 2 (two) times daily. 60 tablet 1    hydrOXYzine (ATARAX/VISTARIL) 50 MG tablet Take 1 tablet (50 mg total) by mouth every 6 (six) hours as needed for anxiety. 20 tablet 0    hydrOXYzine (ATARAX/VISTARIL) 50 MG tablet Take 1 tablet (50  mg total) by mouth every 6 (six) hours as needed for anxiety. 60 tablet 1    lithium carbonate (LITHOBID) 300 MG CR tablet Take 2 tablets (600 mg total) by mouth every 12 (twelve) hours. 40 tablet 0    lithium carbonate (LITHOBID) 300 MG CR tablet Take 2 tablets (600 mg total) by mouth every 12 (twelve) hours. 120 tablet 1    nicotine (NICODERM CQ - DOSED IN MG/24 HOURS) 14 mg/24hr patch Place 1 patch (14 mg total) onto the skin daily. 14 patch 0    nicotine (NICODERM CQ - DOSED IN MG/24 HOURS) 14 mg/24hr patch Place 1 patch (14 mg total) onto the skin daily. 28 patch 1    propranolol (INDERAL) 20 MG tablet Take 1 tablet (20 mg total) by mouth 2 (two) times daily. 20 tablet 0    propranolol (INDERAL) 20 MG tablet Take 1 tablet (20 mg total) by mouth 2 (two) times daily. 60 tablet 1    risperiDONE (RISPERDAL) 2 MG tablet Take 3 tablets (6 mg total) by mouth 2 (two) times daily. 60 tablet 0    risperiDONE (RISPERDAL) 3 MG tablet Take  2 tablets (6 mg total) by mouth 2 (two) times daily. 120 tablet 1    temazepam (RESTORIL) 15 MG capsule Take 1 capsule (15 mg total) by mouth at bedtime as needed for sleep. 30 capsule 1     Patient Stressors:    Patient Strengths:    Treatment Modalities: Medication Management, Group therapy, Case management,  1 to 1 session with clinician, Psychoeducation, Recreational therapy.   Physician Treatment Plan for Primary Diagnosis: Schizo affective schizophrenia (HCC) Long Term Goal(s): Improvement in symptoms so as ready for discharge   Short Term Goals: Ability to maintain clinical measurements within normal limits will improve Compliance with prescribed medications will improve Ability to disclose and discuss suicidal ideas Ability to demonstrate self-control will improve  Medication Management: Evaluate patient's response, side effects, and tolerance of medication regimen.  Therapeutic Interventions: 1 to 1 sessions, Unit Group sessions and Medication administration.  Evaluation of Outcomes: Progressing  Physician Treatment Plan for Secondary Diagnosis: Principal Problem:   Schizo affective schizophrenia (HCC)  Long Term Goal(s): Improvement in symptoms so as ready for discharge   Short Term Goals: Ability to maintain clinical measurements within normal limits will improve Compliance with prescribed medications will improve Ability to disclose and discuss suicidal ideas Ability to demonstrate self-control will improve     Medication Management: Evaluate patient's response, side effects, and tolerance of medication regimen.  Therapeutic Interventions: 1 to 1 sessions, Unit Group sessions and Medication administration.  Evaluation of Outcomes: Progressing   RN Treatment Plan for Primary Diagnosis: Schizo affective schizophrenia (HCC) Long Term Goal(s): Knowledge of disease and therapeutic regimen to maintain health will improve  Short Term Goals: Ability to verbalize  frustration and anger appropriately will improve, Ability to demonstrate self-control, Ability to participate in decision making will improve, Ability to verbalize feelings will improve, Ability to disclose and discuss suicidal ideas, Ability to identify and develop effective coping behaviors will improve, and Compliance with prescribed medications will improve  Medication Management: RN will administer medications as ordered by provider, will assess and evaluate patient's response and provide education to patient for prescribed medication. RN will report any adverse and/or side effects to prescribing provider.  Therapeutic Interventions: 1 on 1 counseling sessions, Psychoeducation, Medication administration, Evaluate responses to treatment, Monitor vital signs and CBGs as ordered, Perform/monitor CIWA, COWS, AIMS and Fall Risk screenings  as ordered, Perform wound care treatments as ordered.  Evaluation of Outcomes: Progressing   LCSW Treatment Plan for Primary Diagnosis: Schizo affective schizophrenia (HCC) Long Term Goal(s): Safe transition to appropriate next level of care at discharge, Engage patient in therapeutic group addressing interpersonal concerns.  Short Term Goals: Engage patient in aftercare planning with referrals and resources, Increase social support, Increase ability to appropriately verbalize feelings, Increase emotional regulation, Facilitate acceptance of mental health diagnosis and concerns, Facilitate patient progression through stages of change regarding substance use diagnoses and concerns, and Increase skills for wellness and recovery  Therapeutic Interventions: Assess for all discharge needs, 1 to 1 time with Social worker, Explore available resources and support systems, Assess for adequacy in community support network, Educate family and significant other(s) on suicide prevention, Complete Psychosocial Assessment, Interpersonal group therapy.  Evaluation of Outcomes:  Progressing   Progress in Treatment: Attending groups: Yes. Participating in groups: Yes. Taking medication as prescribed: Yes. Toleration medication: Yes. Family/Significant other contact made: Yes, individual(s) contacted:  SPE completed with patient's mother.  Patient understands diagnosis: Yes. Discussing patient identified problems/goals with staff: Yes. Medical problems stabilized or resolved: Yes. Denies suicidal/homicidal ideation: Yes. Issues/concerns per patient self-inventory: No. Other: none.  New problem(s) identified: No, Describe:  none.  New Short Term/Long Term Goal(s): Patient to work towards elimination of symptoms of psychosis, medication management for mood stabilization; development of comprehensive mental wellness plan.  Update 09/05/2021:  No changes at this time. Update 09/10/2021: No changes at this time.   Patient Goals:  Patient states, " My mental states, got pictures coming up in my head and never had that before." Update 09/05/2021:  No changes at this time. Update 09/10/2021: No changes at this time.   Discharge Plan or Barriers: No psychosocial barriers identified at this time, patient to return to place of residence when appropriate for discharge. Reports living with mother.  Update 09/05/2021:  Patient continues to report auditory hallucinations at this time.  Patient currently hears voices and uses toilet paper to stuff his ears to assist with the hallucinations.  Recently he has been attempting to urinate in the floor.  Once behaviors improve further discussions can be had on discharge. Update 09/10/2021: Patient continues to report auditory hallucinations; however, patient has not displayed disruptive behavior in the past 2 days.   Reason for Continuation of Hospitalization: Hallucinations Medication stabilization  Estimated Length of Stay: TBD   Scribe for Treatment Team: Marletta Lor 09/10/2021 9:12 AM

## 2021-09-11 DIAGNOSIS — F259 Schizoaffective disorder, unspecified: Secondary | ICD-10-CM | POA: Diagnosis not present

## 2021-09-11 MED ORDER — PROPRANOLOL HCL 20 MG PO TABS
20.0000 mg | ORAL_TABLET | Freq: Three times a day (TID) | ORAL | 0 refills | Status: DC
  Filled 2021-09-11: qty 30, 10d supply, fill #0

## 2021-09-11 MED ORDER — HYDROXYZINE HCL 50 MG PO TABS
50.0000 mg | ORAL_TABLET | Freq: Three times a day (TID) | ORAL | 0 refills | Status: DC | PRN
  Filled 2021-09-11: qty 20, 7d supply, fill #0

## 2021-09-11 MED ORDER — NICOTINE 14 MG/24HR TD PT24
14.0000 mg | MEDICATED_PATCH | Freq: Every day | TRANSDERMAL | 0 refills | Status: DC
  Filled 2021-09-11: qty 14, 14d supply, fill #0

## 2021-09-11 MED ORDER — AMLODIPINE BESYLATE 5 MG PO TABS
5.0000 mg | ORAL_TABLET | Freq: Two times a day (BID) | ORAL | 0 refills | Status: DC
  Filled 2021-09-11: qty 20, 10d supply, fill #0

## 2021-09-11 MED ORDER — PANTOPRAZOLE SODIUM 40 MG PO TBEC
40.0000 mg | DELAYED_RELEASE_TABLET | Freq: Every day | ORAL | 0 refills | Status: DC
  Filled 2021-09-11: qty 10, 10d supply, fill #0

## 2021-09-11 MED ORDER — OLANZAPINE 10 MG PO TABS
30.0000 mg | ORAL_TABLET | Freq: Every day | ORAL | 0 refills | Status: DC
  Filled 2021-09-11: qty 30, 10d supply, fill #0

## 2021-09-11 MED ORDER — BENZTROPINE MESYLATE 0.5 MG PO TABS
1.0000 mg | ORAL_TABLET | Freq: Every day | ORAL | 0 refills | Status: DC
  Filled 2021-09-11: qty 20, 10d supply, fill #0

## 2021-09-11 MED ORDER — LITHIUM CARBONATE ER 300 MG PO TBCR
600.0000 mg | EXTENDED_RELEASE_TABLET | Freq: Two times a day (BID) | ORAL | 0 refills | Status: DC
  Filled 2021-09-11: qty 40, 10d supply, fill #0

## 2021-09-11 NOTE — Group Note (Signed)
[facility] LCSW Group Therapy Note ? ? ?Group Date: 09/11/2021 ?Start Time: 1315 ?End Time: 1415 ? ? ?Type of Therapy and Topic: Group Therapy: Avoiding Self-Sabotaging and Enabling Behaviors ? ?Participation Level: Did Not Attend ? ?Mood: ? ?Description of Group:  ?In this group, patients will learn how to identify obstacles, self-sabotaging and enabling behaviors, as well as: what are they, why do we do them and what needs these behaviors meet. Discuss unhealthy relationships and how to have positive healthy boundaries with those that sabotage and enable. Explore aspects of self-sabotage and enabling in yourself and how to limit these self-destructive behaviors in everyday life. ? ? ?Therapeutic Goals: ?1. Patient will identify one obstacle that relates to self-sabotage and enabling behaviors ?2. Patient will identify one personal self-sabotaging or enabling behavior they did prior to admission ?3. Patient will state a plan to change the above identified behavior ?4. Patient will demonstrate ability to communicate their needs through discussion and/or role play.  ? ? ?Summary of Patient Progress: Due to limited staffing, group was not held on the unit. ? ? ? ?Therapeutic Modalities:  ?Cognitive Behavioral Therapy ?Person-Centered Therapy ?Motivational Interviewing ? ? ? ?Arrick Dutton K Mahogony Gilchrest, LCSWA ?

## 2021-09-11 NOTE — Progress Notes (Signed)
[facility] MD Progress Note  09/11/2021 1:24 PM Nicholas Nielsen  MRN:  [id number] Subjective: Follow-up this patient with what appears to be schizoaffective disorder or schizophrenia.  Patient woke up this morning irritable and had to get some medication but was not hostile to anyone.  By the time I saw him he was calmer.  Blunted affect as usual.  States that the hallucinations are much improved.  Denies suicidal or homicidal thought.  Not having any side effects from medication Principal Problem: Schizo affective schizophrenia (HCC) Diagnosis: Principal Problem:   Schizo affective schizophrenia (HCC)  Total Time spent with patient: 30 minutes  Past Psychiatric History: History of recurrent psychotic episodes  Past Medical History:  Past Medical History:  Diagnosis Date   Back pain    Bipolar 1 disorder (HCC)    Hepatitis C    Kidney stones    Schizo affective schizophrenia [facility])    Testicular cyst    History reviewed. No pertinent surgical history. Family History:  Family History  Problem Relation Age of Onset   Stroke Father    Hypertension Father    Family Psychiatric  History: See previous Social History:  Social History   Substance and Sexual Activity  Alcohol Use Not Currently     Social History   Substance and Sexual Activity  Drug Use Yes   Frequency: 7.0 times per week   Types: Marijuana   Comment: Daily use; last use is 03/26/2019    Social History   Socioeconomic History   Marital status: Single    Spouse name: Not on file   Number of children: Not on file   Years of education: Not on file   Highest education level: Not on file  Occupational History   Occupation: Unemployed  Tobacco Use   Smoking status: Every Day    Packs/day: 1.00    Years: 15.00    Pack years: 15.00    Types: Cigarettes   Smokeless tobacco: Never  Vaping Use   Vaping Use: Never used  Substance and Sexual Activity   Alcohol use: Not Currently   Drug use: Yes    Frequency: 7.0  times per week    Types: Marijuana    Comment: Daily use; last use is 03/26/2019   Sexual activity: Never    Birth control/protection: Condom  Other Topics Concern   Not on file  Social History Narrative   Pt stated that he is currently living with his brother.  He lives in [place] and is unemployed.  Pt stated that he recently arranged an appointment with [facility].   Social Determinants of Health   Financial Resource Strain: Not on file  Food Insecurity: Not on file  Transportation Needs: Not on file  Physical Activity: Not on file  Stress: Not on file  Social Connections: Not on file   Additional Social History:                         Sleep: Fair  Appetite:  Fair  Current Medications: Current Facility-Administered Medications  Medication Dose Route Frequency Provider Last Rate Last Admin   acetaminophen (TYLENOL) tablet 650 mg  650 mg Oral Q6H PRN Gabriel Cirri F, NP   650 mg at 09/08/21 2055   alum & mag hydroxide-simeth (MAALOX/MYLANTA) 200-200-20 MG/5ML suspension 30 mL  30 mL Oral Q4H PRN Gabriel Cirri F, NP       amLODipine (NORVASC) tablet 5 mg  5 mg Oral BID Gabriel Cirri  F, NP   5 mg at 09/11/21 0813   benztropine (COGENTIN) tablet 1 mg  1 mg Oral QHS Sherria Riemann T, MD       docusate sodium (COLACE) capsule 100 mg  100 mg Oral BID Shaye Lagace T, MD   100 mg at 09/11/21 0813   hydrOXYzine (ATARAX) tablet 50 mg  50 mg Oral TID PRN Vanetta Mulders, NP   50 mg at 09/07/21 4496   lithium carbonate (LITHOBID) CR tablet 600 mg  600 mg Oral Q12H Gabriel Cirri F, NP   600 mg at 09/11/21 0813   magnesium hydroxide (MILK OF MAGNESIA) suspension 30 mL  30 mL Oral Daily PRN Gabriel Cirri F, NP   30 mL at 09/06/21 0847   nicotine (NICODERM CQ - dosed in mg/24 hours) patch 14 mg  14 mg Transdermal Daily Gabriel Cirri F, NP   14 mg at 09/11/21 7591   nicotine polacrilex (NICORETTE) gum 2 mg  2 mg Oral PRN Vanetta Mulders, NP       OLANZapine  (ZYPREXA) tablet 30 mg  30 mg Oral QHS Debborah Alonge T, MD   30 mg at 09/10/21 2200   pantoprazole (PROTONIX) EC tablet 40 mg  40 mg Oral Daily Hinata Diener, Jackquline Denmark, MD   40 mg at 09/11/21 0813   propranolol (INDERAL) tablet 20 mg  20 mg Oral TID Nikki Glanzer T, MD   20 mg at 09/11/21 1144   senna-docusate (Senokot-S) tablet 2 tablet  2 tablet Oral Once Keirah Konitzer T, MD       temazepam (RESTORIL) capsule 15 mg  15 mg Oral QHS PRN Gabriel Cirri F, NP   15 mg at 09/10/21 2109   ziprasidone (GEODON) injection 20 mg  20 mg Intramuscular Q12H PRN Shalese Strahan, Jackquline Denmark, MD   20 mg at 09/11/21 0818    Lab Results: No results found for this or any previous visit (from the past 48 hour(s)).  Blood Alcohol level:  Lab Results  Component Value Date   ETH <10 08/30/2021   ETH <10 03/26/2019    Metabolic Disorder Labs: Lab Results  Component Value Date   HGBA1C 5.2 08/31/2021   MPG 103 08/31/2021   MPG 96.8 05/05/2021   No results found for: PROLACTIN Lab Results  Component Value Date   CHOL 207 (H) 08/31/2021   TRIG 265 (H) 08/31/2021   HDL 31 (L) 08/31/2021   CHOLHDL 6.7 08/31/2021   VLDL 53 (H) 08/31/2021   LDLCALC 123 (H) 08/31/2021   LDLCALC 150 (H) 05/05/2021    Physical Findings: AIMS:  , ,  ,  ,    CIWA:    COWS:     Musculoskeletal: Strength & Muscle Tone: within normal limits Gait & Station: normal Patient leans: N/A  Psychiatric Specialty Exam:  Presentation  General Appearance: Appropriate for Environment  Eye Contact:Good  Speech:Clear and Coherent  Speech Volume:Normal  Handedness:Right   Mood and Affect  Mood:Depressed  Affect:Tearful; Congruent   Thought Process  Thought Processes:Coherent  Descriptions of Associations:Intact  Orientation:Full (Time, Place and Person)  Thought Content:WDL  History of Schizophrenia/Schizoaffective disorder:No  Duration of Psychotic Symptoms:Less than six months  Hallucinations:No data recorded Ideas of  Reference:Paranoia; Delusions  Suicidal Thoughts:No data recorded Homicidal Thoughts:No data recorded  Sensorium  Memory:Immediate Good  Judgment:Fair  Insight:Good   Executive Functions  Concentration:Fair  Attention Span:Good  Recall:Good  Fund of Knowledge:Good  Language:Good   Psychomotor Activity  Psychomotor Activity:No data recorded  Assets  Assets:Desire for Improvement; Communication Skills; Financial Resources/Insurance; Housing; Resilience; Physical Health   Sleep  Sleep:No data recorded   Physical Exam: Physical Exam Vitals and nursing note reviewed.  Constitutional:      Appearance: Normal appearance.  HENT:     Head: Normocephalic and atraumatic.     Mouth/Throat:     Pharynx: Oropharynx is clear.  Eyes:     Pupils: Pupils are equal, round, and reactive to light.  Cardiovascular:     Rate and Rhythm: Normal rate and regular rhythm.  Pulmonary:     Effort: Pulmonary effort is normal.     Breath sounds: Normal breath sounds.  Abdominal:     General: Abdomen is flat.     Palpations: Abdomen is soft.  Musculoskeletal:        General: Normal range of motion.  Skin:    General: Skin is warm and dry.  Neurological:     General: No focal deficit present.     Mental Status: He is alert. Mental status is at baseline.  Psychiatric:        Mood and Affect: Mood normal.        Thought Content: Thought content normal.   Review of Systems  Constitutional: Negative.   HENT: Negative.    Eyes: Negative.   Respiratory: Negative.    Cardiovascular: Negative.   Gastrointestinal: Negative.   Musculoskeletal: Negative.   Skin: Negative.   Neurological: Negative.   Psychiatric/Behavioral:  Negative for hallucinations and suicidal ideas. The patient is nervous/anxious.   Blood pressure 101/77, pulse 69, temperature (!) 97.3 F (36.3 C), temperature source Oral, resp. rate 18, height 6\' 1"  (1.854 m), weight 67.6 kg, SpO2 99 %. Body mass index is  19.66 kg/m.   Treatment Plan Summary: Medication management and Plan patient appears to have stabilized enough that we can hope we will be stable outside the hospital.  I am going to plan on probable discharge for tomorrow.  I have requested a 10-day supply of his medicine from the pharmacy when they open.  Mordecai RasmussenJohn Skyleen Bentley, MD 09/11/2021, 1:24 PM

## 2021-09-11 NOTE — Plan of Care (Signed)
?  Problem: Education: ?Goal: Knowledge of [facility] Education information/materials will improve ?Outcome: Progressing ?Goal: Emotional status will improve ?Outcome: Not Progressing ?Goal: Mental status will improve ?Outcome: Not Progressing ?Goal: Verbalization of understanding the information provided will improve ?Outcome: Progressing ?  ?Problem: Safety: ?Goal: Periods of time without injury will increase ?Outcome: Progressing ?  ?Problem: Education: ?Goal: Will be free of psychotic symptoms ?Outcome: Not Progressing ?Goal: Knowledge of the prescribed therapeutic regimen will improve ?Outcome: Progressing ?  ?Problem: Safety: ?Goal: Ability to redirect hostility and anger into socially appropriate behaviors will improve ?Outcome: Progressing ?Goal: Ability to remain free from injury will improve ?Outcome: Progressing ?  ?

## 2021-09-11 NOTE — Progress Notes (Signed)
D: Pt alert and alert this am. Reports not sleeping well and feeling agitated. States, "I just want to get out of here. I'm not feeling it. I'm agitated." Denies SI/HI/AVH, depression and pain. Endorses anxiety. Rates 2/10. Reports mostly feeling agitation.  ? ?A: Scheduled medications administered to pt, per MD orders. PRN given for agitation. Patient isolates to self when on milieu and remains in room except for meals and group. .  ? ?R: No adverse drug reactions noted. Pt verbally contracts for safety at this time. Pt complaint with medications and treatment plan. Pt interacts well with others on the unit. Pt remains safe at this time. Will continue to monitor.    ?

## 2021-09-12 ENCOUNTER — Other Ambulatory Visit: Payer: Self-pay

## 2021-09-12 DIAGNOSIS — F259 Schizoaffective disorder, unspecified: Secondary | ICD-10-CM | POA: Diagnosis not present

## 2021-09-12 MED ORDER — OLANZAPINE 15 MG PO TABS: 30.0000 mg | ORAL_TABLET | Freq: Every day | ORAL | 1 refills | Status: DC

## 2021-09-12 MED ORDER — OLANZAPINE 15 MG PO TABS
30.0000 mg | ORAL_TABLET | Freq: Every day | ORAL | 0 refills | Status: DC
  Filled 2021-09-12: qty 20, 10d supply, fill #0

## 2021-09-12 MED ORDER — LITHIUM CARBONATE ER 300 MG PO TBCR
600.0000 mg | EXTENDED_RELEASE_TABLET | Freq: Two times a day (BID) | ORAL | 0 refills | Status: DC
  Filled 2021-09-12 (×3): qty 40, 10d supply, fill #0

## 2021-09-12 MED ORDER — PROPRANOLOL HCL 20 MG PO TABS
20.0000 mg | ORAL_TABLET | Freq: Three times a day (TID) | ORAL | 0 refills | Status: DC
  Filled 2021-09-12 (×3): qty 30, 10d supply, fill #0

## 2021-09-12 MED ORDER — AMLODIPINE BESYLATE 5 MG PO TABS
5.0000 mg | ORAL_TABLET | Freq: Two times a day (BID) | ORAL | 0 refills | Status: DC
  Filled 2021-09-12 (×3): qty 20, 10d supply, fill #0

## 2021-09-12 MED ORDER — BENZTROPINE MESYLATE 0.5 MG PO TABS
1.0000 mg | ORAL_TABLET | Freq: Every day | ORAL | 1 refills | Status: DC
Start: 2021-09-12 — End: 2021-09-12

## 2021-09-12 MED ORDER — DOCUSATE SODIUM 100 MG PO CAPS
100.0000 mg | ORAL_CAPSULE | Freq: Two times a day (BID) | ORAL | 1 refills | Status: DC
Start: 2021-09-12 — End: 2021-09-12

## 2021-09-12 MED ORDER — PANTOPRAZOLE SODIUM 40 MG PO TBEC
40.0000 mg | DELAYED_RELEASE_TABLET | Freq: Every day | ORAL | 0 refills | Status: DC
  Filled 2021-09-12 (×3): qty 10, 10d supply, fill #0

## 2021-09-12 MED ORDER — HYDROXYZINE HCL 50 MG PO TABS
50.0000 mg | ORAL_TABLET | Freq: Three times a day (TID) | ORAL | 0 refills | Status: DC | PRN
  Filled 2021-09-12 (×2): qty 20, 7d supply, fill #0

## 2021-09-12 MED ORDER — NICOTINE 14 MG/24HR TD PT24
14.0000 mg | MEDICATED_PATCH | Freq: Every day | TRANSDERMAL | 0 refills | Status: DC
Start: 2021-09-12 — End: 2022-07-05
  Filled 2021-09-12 (×3): qty 14, 14d supply, fill #0

## 2021-09-12 MED ORDER — TEMAZEPAM 15 MG PO CAPS
15.0000 mg | ORAL_CAPSULE | Freq: Every evening | ORAL | 1 refills | Status: DC | PRN
Start: 2021-09-12 — End: 2022-07-05

## 2021-09-12 MED ORDER — AMLODIPINE BESYLATE 5 MG PO TABS: 5.0000 mg | ORAL_TABLET | Freq: Two times a day (BID) | ORAL | 1 refills | Status: DC

## 2021-09-12 MED ORDER — PROPRANOLOL HCL 20 MG PO TABS
20.0000 mg | ORAL_TABLET | Freq: Three times a day (TID) | ORAL | 1 refills | Status: DC
Start: 2021-09-12 — End: 2021-09-12

## 2021-09-12 MED ORDER — HYDROXYZINE HCL 50 MG PO TABS: 50.0000 mg | ORAL_TABLET | Freq: Three times a day (TID) | ORAL | 1 refills | Status: DC | PRN

## 2021-09-12 MED ORDER — NICOTINE 14 MG/24HR TD PT24: 14.0000 mg | MEDICATED_PATCH | Freq: Every day | TRANSDERMAL | 0 refills | Status: DC

## 2021-09-12 MED ORDER — LITHIUM CARBONATE ER 300 MG PO TBCR: 600.0000 mg | EXTENDED_RELEASE_TABLET | Freq: Two times a day (BID) | ORAL | 1 refills | Status: DC

## 2021-09-12 MED ORDER — PANTOPRAZOLE SODIUM 40 MG PO TBEC: 40.0000 mg | DELAYED_RELEASE_TABLET | Freq: Every day | ORAL | 1 refills | Status: DC

## 2021-09-12 MED ORDER — BENZTROPINE MESYLATE 1 MG PO TABS
1.0000 mg | ORAL_TABLET | Freq: Every day | ORAL | 0 refills | Status: DC
  Filled 2021-09-12: qty 10, 10d supply, fill #0

## 2021-09-12 MED ORDER — DOCUSATE SODIUM 100 MG PO CAPS
100.0000 mg | ORAL_CAPSULE | Freq: Two times a day (BID) | ORAL | 0 refills | Status: DC
  Filled 2021-09-12: qty 20, 10d supply, fill #0

## 2021-09-12 NOTE — Progress Notes (Signed)
?  [facility] Adult Case Management Discharge Plan : ? ?Will you be returning to the same living situation after discharge:  Yes,  pt reports he is returning home. ?At discharge, do you have transportation home?: Yes,  pts mother will provide transportation.  ?Do you have the ability to pay for your medications: No. ? ?Release of information consent forms completed and in the chart;  Patient's signature needed at discharge. ? ?Patient to Follow up at: ? Follow-up Information   ? ? [facility]   ?Why: Please attend your scheduled appointment October 03, 2021 at 12:30 for face to face. ?Contact information: ?[address] ?[place] [address] ?[phone] ? ? ?  ?  ? ?  ?  ? ?  ? ? ?Next level of care provider has access to [facility] ? ?Safety Planning and Suicide Prevention discussed: Yes,  SPE completed with the patients mother. ? ?  ? ?Has patient been referred to the Quitline?: Patient refused referral ? ?Patient has been referred for addiction treatment: Pt. refused referral ? ?Rozann Lesches, LCSW ?09/12/2021, 10:58 AM ?

## 2021-09-12 NOTE — Progress Notes (Signed)
Patient alert and oriented x 4 no bizarre during the shift , thoughts are organized, makes appropriate eye contact, denies SI/HI/AVH complaint with medication regimen, 15 minutes safety checks maintained will continue to monitor.  ?

## 2021-09-12 NOTE — Progress Notes (Signed)
Patient appropriate with staff & peers. Denies SI,HI and AVH. Verbalized understanding discharge instructions,prescriptions and follow up care. All belongings returned from [employer]. Patient escorted out by staff and transported by family. ?

## 2021-09-12 NOTE — Discharge Summary (Signed)
Physician Discharge Summary Note  Patient:  Nicholas Nielsen is an 41 y.o., male MRN:  [id number] DOB:  [birth year] Patient phone:  [phone] (home)  Patient address:   [address] [place] [id number],  Total Time spent with patient: 30 minutes  Date of Admission:  08/30/2021 Date of Discharge: 09/12/2021  Reason for Admission: Patient was admitted because of return of psychosis with severe intrusive hallucinations agitation and mood instability suicidal thoughts.  Principal Problem: Schizo affective schizophrenia [facility](HCC) Discharge Diagnoses: Principal Problem:   Schizo affective schizophrenia (HCC)   Past Psychiatric History: Past history of schizoaffective disorder with previous admissions for psychosis  Past Medical History:  Past Medical History:  Diagnosis Date   Back pain    Bipolar 1 disorder (HCC)    Hepatitis C    Kidney stones    Schizo affective schizophrenia [facility](HCC)    Testicular cyst    History reviewed. No pertinent surgical history. Family History:  Family History  Problem Relation Age of Onset   Stroke Father    Hypertension Father    Family Psychiatric  History: See previous.  Positive for mental health history Social History:  Social History   Substance and Sexual Activity  Alcohol Use Not Currently     Social History   Substance and Sexual Activity  Drug Use Yes   Frequency: 7.0 times per week   Types: Marijuana   Comment: Daily use; last use is 03/26/2019    Social History   Socioeconomic History   Marital status: Single    Spouse name: Not on file   Number of children: Not on file   Years of education: Not on file   Highest education level: Not on file  Occupational History   Occupation: Unemployed  Tobacco Use   Smoking status: Every Day    Packs/day: 1.00    Years: 15.00    Pack years: 15.00    Types: Cigarettes   Smokeless tobacco: Never  Vaping Use   Vaping Use: Never used  Substance and Sexual Activity   Alcohol  use: Not Currently   Drug use: Yes    Frequency: 7.0 times per week    Types: Marijuana    Comment: Daily use; last use is 03/26/2019   Sexual activity: Never    Birth control/protection: Condom  Other Topics Concern   Not on file  Social History Narrative   Pt stated that he is currently living with his brother.  [place] and is unemployed.  Pt stated that he recently arranged an appointment with [facility].   Social Determinants of Health   Financial Resource Strain: Not on file  Food Insecurity: Not on file  Transportation Needs: Not on file  Physical Activity: Not on file  Stress: Not on file  Social Connections: Not on file    Hospital Course: Admitted to psychiatric unit.  Initially appeared very psychotic with intrusive hallucinations disorganized thinking and difficulty concentrating.  Stayed withdrawn most of the time.  Usually Paper jammed in his ears just to try to stop the hallucinations.  Was not aggressive to anyone else but on at least one occasion was seen shouting at his hallucinations.  Patient was compliant with medicine and was started back on antipsychotics and lithium.  He was responding only slowly if at all when on Risperdal and medicines were changed so that now he is on olanzapine.  Seems to be doing much better now.  He is on olanzapine lithium as primary medicines  with Restoril as needed to help with sleep.  Blood pressure has been treated appropriately.  Constipation treated appropriately.  At the time of discharge patient is not expressing any suicidal or homicidal ideation and expresses insight and understanding that he needs to stay on his medicine.  He will be discharged home with prescriptions and a supply of medicine with follow-up in the community.  Physical Findings: AIMS:  , ,  ,  ,    CIWA:    COWS:     Musculoskeletal: Strength & Muscle Tone: within normal limits Gait & Station: normal Patient leans: N/A   Psychiatric Specialty  Exam:  Presentation  General Appearance: Appropriate for Environment  Eye Contact:Good  Speech:Clear and Coherent  Speech Volume:Normal  Handedness:Right   Mood and Affect  Mood:Depressed  Affect:Tearful; Congruent   Thought Process  Thought Processes:Coherent  Descriptions of Associations:Intact  Orientation:Full (Time, Place and Person)  Thought Content:WDL  History of Schizophrenia/Schizoaffective disorder:No  Duration of Psychotic Symptoms:Less than six months  Hallucinations:No data recorded Ideas of Reference:Paranoia; Delusions  Suicidal Thoughts:No data recorded Homicidal Thoughts:No data recorded  Sensorium  Memory:Immediate Good  Judgment:Fair  Insight:Good   Executive Functions  Concentration:Fair  Attention Span:Good  Recall:Good  Fund of Knowledge:Good  Language:Good   Psychomotor Activity  Psychomotor Activity:No data recorded  Assets  Assets:Desire for Improvement; Manufacturing systems engineer; Financial Resources/Insurance; Housing; Resilience; Physical Health   Sleep  Sleep:No data recorded   Physical Exam: Physical Exam Vitals and nursing note reviewed.  Constitutional:      Appearance: Normal appearance.  HENT:     Head: Normocephalic and atraumatic.     Mouth/Throat:     Pharynx: Oropharynx is clear.  Eyes:     Pupils: Pupils are equal, round, and reactive to light.  Cardiovascular:     Rate and Rhythm: Normal rate and regular rhythm.  Pulmonary:     Effort: Pulmonary effort is normal.     Breath sounds: Normal breath sounds.  Abdominal:     General: Abdomen is flat.     Palpations: Abdomen is soft.  Musculoskeletal:        General: Normal range of motion.  Skin:    General: Skin is warm and dry.  Neurological:     General: No focal deficit present.     Mental Status: He is alert. Mental status is at baseline.  Psychiatric:        Attention and Perception: Attention normal.        Mood and Affect: Mood  normal. Affect is blunt.        Speech: Speech is delayed.        Behavior: Behavior is slowed.        Thought Content: Thought content normal.        Cognition and Memory: Cognition normal.        Judgment: Judgment normal.   Review of Systems  Constitutional: Negative.   HENT: Negative.    Eyes: Negative.   Respiratory: Negative.    Cardiovascular: Negative.   Gastrointestinal: Negative.   Musculoskeletal: Negative.   Skin: Negative.   Neurological: Negative.   Psychiatric/Behavioral: Negative.    Blood pressure (!) 110/9, pulse 69, temperature (!) 97.3 F (36.3 C), temperature source Oral, resp. rate 18, height 6\' 1"  (1.854 m), weight 67.6 kg, SpO2 100 %. Body mass index is 19.66 kg/m.   Social History   Tobacco Use  Smoking Status Every Day   Packs/day: 1.00   Years: 15.00   Pack  years: 15.00   Types: Cigarettes  Smokeless Tobacco Never   Tobacco Cessation:  A prescription for an FDA-approved tobacco cessation medication provided at discharge   Blood Alcohol level:  Lab Results  Component Value Date   [facility] <10 08/30/2021   ETH <10 03/26/2019    Metabolic Disorder Labs:  Lab Results  Component Value Date   HGBA1C 5.2 08/31/2021   MPG 103 08/31/2021   MPG 96.8 05/05/2021   No results found for: PROLACTIN Lab Results  Component Value Date   CHOL 207 (H) 08/31/2021   TRIG 265 (H) 08/31/2021   HDL 31 (L) 08/31/2021   CHOLHDL 6.7 08/31/2021   VLDL 53 (H) 08/31/2021   LDLCALC 123 (H) 08/31/2021   LDLCALC 150 (H) 05/05/2021    See Psychiatric Specialty Exam and Suicide Risk Assessment completed by Attending Physician prior to discharge.  Discharge destination:  Home  Is patient on multiple antipsychotic therapies at discharge:  No   Has Patient had three or more failed trials of antipsychotic monotherapy by history:  No  Recommended Plan for Multiple Antipsychotic Therapies: NA  Discharge Instructions     Increase activity slowly   Complete by: As  directed    Increase activity slowly   Complete by: As directed       Allergies as of 09/12/2021       Reactions   Quetiapine Fumarate Other (See Comments)   Wake up with "Locked jaw" with high doses, 300-400 mg   Latex Rash        Medication List     STOP taking these medications    risperiDONE 2 MG tablet Commonly known as: RISPERDAL   risperiDONE 3 MG tablet Commonly known as: RISPERDAL       TAKE these medications      Indication  amLODipine 5 MG tablet Commonly known as: NORVASC Take 1 tablet (5 mg total) by mouth 2 (two) times daily. What changed:  when to take this Another medication with the same name was removed. Continue taking this medication, and follow the directions you see here.  Indication: High Blood Pressure Disorder   benztropine 1 MG tablet Commonly known as: COGENTIN Take 1 tablet (1 mg total) by mouth at bedtime. What changed:  medication strength when to take this Another medication with the same name was removed. Continue taking this medication, and follow the directions you see here.  Indication: Extrapyramidal Reaction caused by Medications   docusate sodium 100 MG capsule Commonly known as: COLACE Take 1 capsule (100 mg total) by mouth 2 (two) times daily.  Indication: Constipation   hydrOXYzine 50 MG tablet Commonly known as: ATARAX Take 1 tablet (50 mg total) by mouth 3 (three) times daily as needed for anxiety. What changed:  when to take this Another medication with the same name was removed. Continue taking this medication, and follow the directions you see here.  Indication: Feeling Anxious   lithium carbonate 300 MG CR tablet Commonly known as: LITHOBID Take 2 tablets (600 mg total) by mouth every 12 (twelve) hours. What changed: Another medication with the same name was removed. Continue taking this medication, and follow the directions you see here.  Indication: Schizoaffective Disorder   nicotine 14 mg/24hr  patch Commonly known as: NICODERM CQ - dosed in mg/24 hours Place 1 patch (14 mg total) onto the skin daily. What changed: Another medication with the same name was removed. Continue taking this medication, and follow the directions you see here.  Indication:  Nicotine Addiction   OLANZapine 15 MG tablet Commonly known as: ZYPREXA Take 2 tablets (30 mg total) by mouth at bedtime.  Indication: Schizophrenia   pantoprazole 40 MG tablet Commonly known as: PROTONIX Take 1 tablet (40 mg total) by mouth daily.  Indication: Gastroesophageal Reflux Disease   propranolol 20 MG tablet Commonly known as: INDERAL Take 1 tablet (20 mg total) by mouth 3 (three) times daily. What changed:  when to take this Another medication with the same name was removed. Continue taking this medication, and follow the directions you see here.  Indication: Rapid Heart Rate Disorder   temazepam 15 MG capsule Commonly known as: RESTORIL Take 1 capsule (15 mg total) by mouth at bedtime as needed for sleep.  Indication: Trouble Sleeping        Follow-up Information     [facility] Follow up.   Why: Please attend your scheduled Contact information: [address] [place] [address] [phone]                 Follow-up recommendations: Follow-up with local mental health agencies and also continue current medicines  Comments: Prescriptions and supply given at discharge  Signed: Mordecai Rasmussen, MD 09/12/2021, 10:10 AM

## 2021-09-12 NOTE — Progress Notes (Signed)
Recreation Therapy Notes ? ? ?Date: 09/12/2021 ? ?Time: 10:50 am   ?  ?Location: Craft room   ? ?Behavioral response: N/A ?  ?Intervention Topic: Time Management   ? ?Discussion/Intervention: ?Patient did not attend group. ?  ?Clinical Observations/Feedback:  ?Patient did not attend group. ?  ?Axie Hayne LRT/CTRS ? ? ? ? ? ? ? ? ?Keisha Amer ?09/12/2021 12:27 PM ?

## 2021-09-12 NOTE — B[facility] Suicide Risk Assessment (Signed)
[facility] Discharge Suicide Risk Assessment ? ? ?Principal Problem: Schizo affective schizophrenia (HCC) ?Discharge Diagnoses: Principal Problem: ?  Schizo affective schizophrenia (HCC) ? ? ?Total Time spent with patient: 30 minutes ? ?Musculoskeletal: ?Strength & Muscle Tone: within normal limits ?Gait & Station: normal ?Patient leans: N/A ? ?Psychiatric Specialty Exam ? ?Presentation  ?General Appearance: Appropriate for Environment ? ?Eye Contact:Good ? ?Speech:Clear and Coherent ? ?Speech Volume:Normal ? ?Handedness:Right ? ? ?Mood and Affect  ?Mood:Depressed ? ?Duration of Depression Symptoms: Greater than two weeks ? ?Affect:Tearful; Congruent ? ? ?Thought Process  ?Thought Processes:Coherent ? ?Descriptions of Associations:Intact ? ?Orientation:Full (Time, Place and Person) ? ?Thought Content:WDL ? ?History of Schizophrenia/Schizoaffective disorder:No ? ?Duration of Psychotic Symptoms:Less than six months ? ?Hallucinations:No data recorded ?Ideas of Reference:Paranoia; Delusions ? ?Suicidal Thoughts:No data recorded ?Homicidal Thoughts:No data recorded ? ?Sensorium  ?Memory:Immediate Good ? ?Judgment:Fair ? ?Insight:Good ? ? ?Executive Functions  ?Concentration:Fair ? ?Attention Span:Good ? ?Recall:Good ? ?Fund of Knowledge:Good ? ?Language:Good ? ? ?Psychomotor Activity  ?Psychomotor Activity:No data recorded ? ?Assets  ?Assets:Desire for Improvement; Communication Skills; Financial Resources/Insurance; Housing; Resilience; Physical Health ? ? ?Sleep  ?Sleep:No data recorded ? ?Physical Exam: ?Physical Exam ?Vitals and nursing note reviewed.  ?Constitutional:   ?   Appearance: Normal appearance.  ?HENT:  ?   Head: Normocephalic and atraumatic.  ?   Mouth/Throat:  ?   Pharynx: Oropharynx is clear.  ?Eyes:  ?   Pupils: Pupils are equal, round, and reactive to light.  ?Cardiovascular:  ?   Rate and Rhythm: Normal rate and regular rhythm.  ?Pulmonary:  ?   Effort: Pulmonary effort is normal.  ?   Breath sounds: Normal  breath sounds.  ?Abdominal:  ?   General: Abdomen is flat.  ?   Palpations: Abdomen is soft.  ?Musculoskeletal:     ?   General: Normal range of motion.  ?Skin: ?   General: Skin is warm and dry.  ?Neurological:  ?   General: No focal deficit present.  ?   Mental Status: He is alert. Mental status is at baseline.  ?Psychiatric:     ?   Attention and Perception: Attention normal.     ?   Mood and Affect: Mood normal. Affect is blunt.     ?   Speech: Speech is delayed.     ?   Behavior: Behavior is slowed.     ?   Thought Content: Thought content normal.     ?   Cognition and Memory: Cognition normal.     ?   Judgment: Judgment normal.  ? ?Review of Systems  ?Constitutional: Negative.   ?HENT: Negative.    ?Eyes: Negative.   ?Respiratory: Negative.    ?Cardiovascular: Negative.   ?Gastrointestinal: Negative.   ?Musculoskeletal: Negative.   ?Skin: Negative.   ?Neurological: Negative.   ?Psychiatric/Behavioral: Negative.    ?Blood pressure (!) 110/9, pulse 69, temperature (!) 97.3 ?F (36.3 ?C), temperature source Oral, resp. rate 18, height 6\' 1"  (1.854 m), weight 67.6 kg, SpO2 100 %. Body mass index is 19.66 kg/m?. ? ?Mental Status Per Nursing Assessment::   ?On Admission:  NA ? ?Demographic Factors:  ?Male, Caucasian, and Low socioeconomic status ? ?Loss Factors: ?Financial problems/change in socioeconomic status ? ?Historical Factors: ?NA ? ?Risk Reduction Factors:   ?Sense of responsibility to family, Living with another person, especially a relative, Positive social support, and Positive therapeutic relationship ? ?Continued Clinical Symptoms:  ?Schizophrenia:   Depressive state ?Paranoid or undifferentiated type ? ?  Cognitive Features That Contribute To Risk:  ?Thought constriction (tunnel vision)   ? ?Suicide Risk:  ?Minimal: No identifiable suicidal ideation.  Patients presenting with no risk factors but with morbid ruminations; may be classified as minimal risk based on the severity of the depressive  symptoms ? ? Follow-up Information   ? ? [facility] Follow up.   ?Why: Please attend your scheduled ?Contact information: ?[address] ?[place] [address] ?[phone] ? ? ?  ?  ? ?  ?  ? ?  ? ? ?Plan Of Care/Follow-up recommendations:  ?Patient denies any suicidal ideation.  Has not displayed any tendency to self harm or made suicidal statements during his time in the hospital.  Has not been aggressive with others.  Patient is compliant with medicine and shows good insight.  He is agreeable to outpatient follow-up treatment.  He will be given prescriptions and a supply of medicine. ? ?Mordecai Rasmussen, MD ?09/12/2021, 10:01 AM ?

## 2021-09-12 NOTE — Progress Notes (Signed)
Recreation Therapy Notes ? ?INPATIENT RECREATION TR PLAN ? ?Patient Details ?Name: Nicholas Nielsen ?MRN: [id number] ?DOB: [birth year] ?Today's Date: 09/12/2021 ? ?Rec Therapy Plan ?Is patient appropriate for Therapeutic Recreation?: Yes ?Treatment times per week: at least 3 ?Estimated Length of Stay: 5-7 days ?TR Treatment/Interventions: Group participation (Comment) ? ?Discharge Criteria ?Pt will be discharged from therapy if:: Discharged ?Treatment plan/goals/alternatives discussed and agreed upon by:: Patient/family ? ?Discharge Summary ?Short term goals set: Patient will engage in groups without prompting or encouragement from LRT x3 group sessions within 5 recreation therapy group sessions ?Short term goals met: Not met ?Reason goals not met: Patient did not attend any groups ?Therapeutic equipment acquired: N/A ?Reason patient discharged from therapy: Discharge from hospital ?Pt/family agrees with progress & goals achieved: Yes ?Date patient discharged from therapy: 09/12/21 ? ? ?Lakeyia Surber ?09/12/2021, 3:55 PM ?

## 2021-09-16 NOTE — Progress Notes (Signed)
MD reminded to place orders for hold and face to face and add comment d/t situation that took place yesterday. MD did see pt yesterday before and after outburst. ?

## 2022-06-26 DIAGNOSIS — F17210 Nicotine dependence, cigarettes, uncomplicated: Secondary | ICD-10-CM | POA: Insufficient documentation

## 2022-06-26 DIAGNOSIS — Z20822 Contact with and (suspected) exposure to covid-19: Secondary | ICD-10-CM | POA: Insufficient documentation

## 2022-06-26 DIAGNOSIS — R4587 Impulsiveness: Secondary | ICD-10-CM | POA: Insufficient documentation

## 2022-06-26 DIAGNOSIS — D72829 Elevated white blood cell count, unspecified: Secondary | ICD-10-CM | POA: Insufficient documentation

## 2022-06-26 DIAGNOSIS — Y900 Blood alcohol level of less than 20 mg/100 ml: Secondary | ICD-10-CM | POA: Insufficient documentation

## 2022-06-26 DIAGNOSIS — G3184 Mild cognitive impairment, so stated: Secondary | ICD-10-CM | POA: Insufficient documentation

## 2022-06-26 DIAGNOSIS — F259 Schizoaffective disorder, unspecified: Secondary | ICD-10-CM | POA: Insufficient documentation

## 2022-06-26 DIAGNOSIS — Z91040 Latex allergy status: Secondary | ICD-10-CM | POA: Insufficient documentation

## 2022-06-27 ENCOUNTER — Inpatient Hospital Stay
Admission: AD | Admit: 2022-06-27 | Discharge: 2022-07-06 | DRG: 885 | Disposition: A | Discharge status: Home / Self Care | Payer: No Typology Code available for payment source | Source: Intra-hospital | Attending: Psychiatry | Admitting: Psychiatry

## 2022-06-27 ENCOUNTER — Encounter: Payer: Self-pay | Admitting: Psychiatry

## 2022-06-27 ENCOUNTER — Emergency Department
Admission: EM | Admit: 2022-06-27 | Discharge: 2022-06-27 | Disposition: A | Discharge status: Other Acute Inpatient Hospital | Payer: No Typology Code available for payment source | Source: Home / Self Care | Attending: Emergency Medicine | Admitting: Emergency Medicine

## 2022-06-27 ENCOUNTER — Other Ambulatory Visit: Payer: Self-pay

## 2022-06-27 DIAGNOSIS — Z888 Allergy status to other drugs, medicaments and biological substances status: Secondary | ICD-10-CM

## 2022-06-27 DIAGNOSIS — Z91040 Latex allergy status: Secondary | ICD-10-CM | POA: Diagnosis not present

## 2022-06-27 DIAGNOSIS — F17210 Nicotine dependence, cigarettes, uncomplicated: Secondary | ICD-10-CM | POA: Diagnosis present

## 2022-06-27 DIAGNOSIS — K219 Gastro-esophageal reflux disease without esophagitis: Secondary | ICD-10-CM | POA: Diagnosis present

## 2022-06-27 DIAGNOSIS — F259 Schizoaffective disorder, unspecified: Principal | ICD-10-CM | POA: Diagnosis present

## 2022-06-27 DIAGNOSIS — Z5982 Transportation insecurity: Secondary | ICD-10-CM | POA: Diagnosis not present

## 2022-06-27 DIAGNOSIS — F319 Bipolar disorder, unspecified: Secondary | ICD-10-CM | POA: Diagnosis present

## 2022-06-27 DIAGNOSIS — R251 Tremor, unspecified: Secondary | ICD-10-CM | POA: Diagnosis present

## 2022-06-27 DIAGNOSIS — F209 Schizophrenia, unspecified: Secondary | ICD-10-CM

## 2022-06-27 DIAGNOSIS — Z8249 Family history of ischemic heart disease and other diseases of the circulatory system: Secondary | ICD-10-CM

## 2022-06-27 DIAGNOSIS — Z5941 Food insecurity: Secondary | ICD-10-CM | POA: Diagnosis not present

## 2022-06-27 DIAGNOSIS — Z79899 Other long term (current) drug therapy: Secondary | ICD-10-CM

## 2022-06-27 DIAGNOSIS — R45851 Suicidal ideations: Secondary | ICD-10-CM | POA: Diagnosis present

## 2022-06-27 DIAGNOSIS — F200 Paranoid schizophrenia: Secondary | ICD-10-CM | POA: Diagnosis not present

## 2022-06-27 DIAGNOSIS — F29 Unspecified psychosis not due to a substance or known physiological condition: Secondary | ICD-10-CM | POA: Diagnosis present

## 2022-06-27 DIAGNOSIS — Z823 Family history of stroke: Secondary | ICD-10-CM

## 2022-06-27 DIAGNOSIS — F251 Schizoaffective disorder, depressive type: Principal | ICD-10-CM | POA: Diagnosis present

## 2022-06-27 DIAGNOSIS — Z1152 Encounter for screening for COVID-19: Secondary | ICD-10-CM | POA: Diagnosis not present

## 2022-06-27 DIAGNOSIS — F1210 Cannabis abuse, uncomplicated: Secondary | ICD-10-CM | POA: Diagnosis present

## 2022-06-27 DIAGNOSIS — Z91148 Patient's other noncompliance with medication regimen for other reason: Secondary | ICD-10-CM | POA: Diagnosis not present

## 2022-06-27 LAB — ETHANOL: Alcohol, Ethyl (B): <10 mg/dL (ref <10)

## 2022-06-27 LAB — COMPREHENSIVE METABOLIC PANEL
ALT: 25 U/L (ref 0–44)
AST: 27 U/L (ref 15–41)
Albumin: 4.6 g/dL (ref 3.5–5.0)
Alkaline Phosphatase: 89 U/L (ref 38–126)
Anion gap: 11 (ref 5–15)
BUN: 14 mg/dL (ref 6–20)
CO2: 22 mmol/L (ref 22–32)
Calcium: 9.5 mg/dL (ref 8.9–10.3)
Chloride: 105 mmol/L (ref 98–111)
Creatinine, Ser: 1.02 mg/dL (ref 0.61–1.24)
GFR, Estimated: >60 mL/min (ref >60)
Glucose, Bld: 117 mg/dL — ABNORMAL HIGH (ref 70–99)
Potassium: 3.5 mmol/L (ref 3.5–5.1)
Sodium: 138 mmol/L (ref 135–145)
Total Bilirubin: 1 mg/dL (ref 0.3–1.2)
Total Protein: 8.4 g/dL — ABNORMAL HIGH (ref 6.5–8.1)

## 2022-06-27 LAB — URINE DRUG SCREEN, QUALITATIVE (ARMC ONLY)
Amphetamines, Ur Screen: NOT DETECTED
Barbiturates, Ur Screen: NOT DETECTED
Benzodiazepine, Ur Scrn: NOT DETECTED
Cannabinoid 50 Ng, Ur ~~LOC~~: NOT DETECTED
Cocaine Metabolite,Ur ~~LOC~~: NOT DETECTED
MDMA (Ecstasy)Ur Screen: NOT DETECTED
Methadone Scn, Ur: NOT DETECTED
Opiate, Ur Screen: NOT DETECTED
Phencyclidine (PCP) Ur S: NOT DETECTED
Tricyclic, Ur Screen: NOT DETECTED

## 2022-06-27 LAB — ACETAMINOPHEN LEVEL: Acetaminophen (Tylenol), Serum: <10 ug/mL — ABNORMAL LOW (ref 10–30)

## 2022-06-27 LAB — RESP PANEL BY RT-PCR (RSV, FLU A&B, COVID)  RVPGX2
Influenza A by PCR: NEGATIVE
Influenza B by PCR: NEGATIVE
Resp Syncytial Virus by PCR: NEGATIVE
SARS Coronavirus 2 by RT PCR: NEGATIVE

## 2022-06-27 LAB — CBC
HCT: 50.8 % (ref 39.0–52.0)
Hemoglobin: 16 g/dL (ref 13.0–17.0)
MCH: 30.5 pg (ref 26.0–34.0)
MCHC: 31.5 g/dL (ref 30.0–36.0)
MCV: 96.9 fL (ref 80.0–100.0)
Platelets: 204 10*3/uL (ref 150–400)
RBC: 5.24 MIL/uL (ref 4.22–5.81)
RDW: 14.2 % (ref 11.5–15.5)
WBC: 14.9 10*3/uL — ABNORMAL HIGH (ref 4.0–10.5)
nRBC: 0 % (ref 0.0–0.2)

## 2022-06-27 LAB — LITHIUM LEVEL: Lithium Lvl: 0.6 mmol/L (ref 0.60–1.20)

## 2022-06-27 LAB — SALICYLATE LEVEL: Salicylate Lvl: <7 mg/dL — ABNORMAL LOW (ref 7.0–30.0)

## 2022-06-27 MED ORDER — PROPRANOLOL HCL 10 MG PO TABS
20.0000 mg | ORAL_TABLET | Freq: Three times a day (TID) | ORAL | Status: DC
  Administered 2022-06-27 (×2): 20 mg via ORAL
  Filled 2022-06-27 (×2): qty 2

## 2022-06-27 MED ORDER — PANTOPRAZOLE SODIUM 40 MG PO TBEC
40.0000 mg | DELAYED_RELEASE_TABLET | Freq: Every day | ORAL | Status: DC
  Administered 2022-06-28 – 2022-07-06 (×9): 40 mg via ORAL
  Filled 2022-06-27 (×9): qty 1

## 2022-06-27 MED ORDER — LORAZEPAM 1 MG PO TABS
1.0000 mg | ORAL_TABLET | Freq: Once | ORAL | Status: AC
Start: 2022-06-27 — End: 2022-06-27
  Administered 2022-06-27: 1 mg via ORAL
  Filled 2022-06-27: qty 1

## 2022-06-27 MED ORDER — LITHIUM CARBONATE ER 300 MG PO TBCR
600.0000 mg | EXTENDED_RELEASE_TABLET | Freq: Two times a day (BID) | ORAL | Status: DC
  Administered 2022-06-27: 600 mg via ORAL
  Filled 2022-06-27 (×2): qty 2

## 2022-06-27 MED ORDER — MAGNESIUM HYDROXIDE 400 MG/5ML PO SUSP: 30.0000 mL | Freq: Every day | ORAL | Status: DC | PRN

## 2022-06-27 MED ORDER — OLANZAPINE 10 MG PO TABS
30.0000 mg | ORAL_TABLET | Freq: Every day | ORAL | Status: DC
  Administered 2022-06-27 – 2022-07-05 (×9): 30 mg via ORAL
  Filled 2022-06-27 (×9): qty 3

## 2022-06-27 MED ORDER — LITHIUM CARBONATE ER 300 MG PO TBCR
600.0000 mg | EXTENDED_RELEASE_TABLET | Freq: Two times a day (BID) | ORAL | Status: DC
  Administered 2022-06-27 – 2022-07-03 (×12): 600 mg via ORAL
  Filled 2022-06-27 (×12): qty 2

## 2022-06-27 MED ORDER — HYDROXYZINE HCL 50 MG PO TABS
50.0000 mg | ORAL_TABLET | Freq: Three times a day (TID) | ORAL | Status: DC | PRN
  Administered 2022-06-28 – 2022-07-05 (×8): 50 mg via ORAL
  Filled 2022-06-27 (×8): qty 1

## 2022-06-27 MED ORDER — ALUM & MAG HYDROXIDE-SIMETH 200-200-20 MG/5ML PO SUSP: 30.0000 mL | ORAL | Status: DC | PRN

## 2022-06-27 MED ORDER — NICOTINE 14 MG/24HR TD PT24
14.0000 mg | MEDICATED_PATCH | Freq: Every day | TRANSDERMAL | Status: DC
  Administered 2022-06-27: 14 mg via TRANSDERMAL
  Filled 2022-06-27: qty 1

## 2022-06-27 MED ORDER — TEMAZEPAM 15 MG PO CAPS
15.0000 mg | ORAL_CAPSULE | Freq: Every evening | ORAL | Status: DC | PRN
  Administered 2022-06-27 – 2022-07-05 (×5): 15 mg via ORAL
  Filled 2022-06-27 (×5): qty 1

## 2022-06-27 MED ORDER — ACETAMINOPHEN 325 MG PO TABS: 650.0000 mg | ORAL_TABLET | Freq: Four times a day (QID) | ORAL | Status: DC | PRN

## 2022-06-27 MED ORDER — BENZTROPINE MESYLATE 1 MG PO TABS
1.0000 mg | ORAL_TABLET | Freq: Every day | ORAL | Status: DC
  Administered 2022-06-27 – 2022-07-05 (×9): 1 mg via ORAL
  Filled 2022-06-27 (×9): qty 1

## 2022-06-27 MED ORDER — LORAZEPAM 2 MG PO TABS
2.0000 mg | ORAL_TABLET | Freq: Once | ORAL | Status: DC
Start: 2022-06-27 — End: 2022-06-27

## 2022-06-27 MED ORDER — TEMAZEPAM 15 MG PO CAPS: 15.0000 mg | ORAL_CAPSULE | Freq: Every evening | ORAL | Status: DC | PRN

## 2022-06-27 MED ORDER — BENZTROPINE MESYLATE 1 MG PO TABS: 1.0000 mg | ORAL_TABLET | Freq: Every day | ORAL | Status: DC

## 2022-06-27 MED ORDER — HYDROXYZINE HCL 25 MG PO TABS: 50.0000 mg | ORAL_TABLET | Freq: Three times a day (TID) | ORAL | Status: DC | PRN

## 2022-06-27 MED ORDER — PANTOPRAZOLE SODIUM 40 MG PO TBEC
40.0000 mg | DELAYED_RELEASE_TABLET | Freq: Every day | ORAL | Status: DC
  Administered 2022-06-27: 40 mg via ORAL
  Filled 2022-06-27: qty 1

## 2022-06-27 MED ORDER — OLANZAPINE 10 MG PO TABS: 30.0000 mg | ORAL_TABLET | Freq: Every day | ORAL | Status: DC

## 2022-06-27 MED ORDER — NICOTINE 14 MG/24HR TD PT24
14.0000 mg | MEDICATED_PATCH | Freq: Every day | TRANSDERMAL | Status: DC
  Administered 2022-06-28 – 2022-07-06 (×9): 14 mg via TRANSDERMAL
  Filled 2022-06-27 (×9): qty 1

## 2022-06-27 MED ORDER — PROPRANOLOL HCL 20 MG PO TABS
20.0000 mg | ORAL_TABLET | Freq: Three times a day (TID) | ORAL | Status: DC
  Administered 2022-06-28 – 2022-07-06 (×25): 20 mg via ORAL
  Filled 2022-06-27 (×25): qty 1

## 2022-06-27 NOTE — Progress Notes (Signed)
41 year old male patient admitted from ED with psychosis. Patient cooperative with admission assessment but irritable and verbally abusive to his mother to call the police. Patient tangential and pressures in his speech. Patient states " Nicholas Nielsen the [employer] agent is watching me for my charges." Patient calm down after the assessment. Denies SI,HI and AVH at this time. Skin assessment and body search done with [facility] RN. No contraband found.Support and encouragement given.

## 2022-06-27 NOTE — B[facility] Group Notes (Signed)
[facility] Group Notes:  (Nursing/MHT/Case Management/Adjunct)  Date:  06/27/2022  Time:  8:40 PM  Type of Therapy:   Wrap up  Participation Level:  Did Not Attend   Mayra Neer 06/27/2022, 8:40 PM

## 2022-06-27 NOTE — Tx Team (Signed)
Initial Treatment Plan 06/27/2022 6:33 PM LEYLAND KENNA DGU:[id number]    PATIENT STRESSORS: Legal issue   Marital or family conflict   Medication change or noncompliance     PATIENT STRENGTHS: Average or above average intelligence  Communication skills  Physical Health  Supportive family/friends    PATIENT IDENTIFIED PROBLEMS: Non compliant with medications  Paranoia  Delusion                 DISCHARGE CRITERIA:  Ability to meet basic life and health needs Medical problems require only outpatient monitoring Motivation to continue treatment in a less acute level of care Verbal commitment to aftercare and medication compliance  PRELIMINARY DISCHARGE PLAN: Attend aftercare/continuing care group Return to previous living arrangement  PATIENT/FAMILY INVOLVEMENT: This treatment plan has been presented to and reviewed with the patient, TALLEY KREISER, and/or family member,.  The patient and family have been given the opportunity to ask questions and make suggestions.  Leonarda Salon, RN 06/27/2022, 6:33 PM

## 2022-06-27 NOTE — ED Triage Notes (Signed)
[facility] with [facility] with ivc papers. Per papers pt is schizophrenic and has not been taking his medications. Pt believes individuals are zapping his testicles with items at this time.
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
## 2022-06-27 NOTE — ED Notes (Signed)
Pt sitting in triage hallway with [place] sherriff officer in no acute distress, awaiting bed placement.
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
## 2022-06-27 NOTE — Plan of Care (Signed)
Pt is anxious; disorganized but compliant with medications.  Delusional thought evident.  Pt denies SI or HI.  Continued monitoring via q 15 minute checks

## 2022-06-27 NOTE — ED Provider Notes (Signed)
-----------------------------------------   8:46 AM on 06/27/2022 ----------------------------------------- I have reevaluated the patient, lab work has resulted showing no significant finding reassuring chemistry, slight leukocytosis otherwise normal CBC, negative acetaminophen, negative salicylate negative ethanol.  Negative urine drug screen.  On recheck patient is slightly diaphoretic he is slightly tremulous as well.  We will dose Ativan for the patient.  He denies significant alcohol use although his presentation is quite consistent with withdrawal.  We will continue to closely monitor while awaiting psychiatric evaluation.  Patient accepted to the behavioral health unit.   Minna Antis, MD 06/27/22 1421

## 2022-06-27 NOTE — ED Notes (Signed)
Pt's mom here to visit

## 2022-06-27 NOTE — ED Notes (Signed)
Hospital meal provided, pt tolerated w/o complaints.  Waste discarded appropriately.  

## 2022-06-27 NOTE — ED Notes (Signed)
Pt dressed out into burgundy scrubs by this rn and tech, Hailey. Belongings placed in belongings bag Gray shirt White socks  Blue jeans Black slides Black underwear Black hat 3 20 dollar bills 1 5 dollar bill 3 1 dollar bills 2 silver chains  Silver ring 1 silver earring

## 2022-06-27 NOTE — ED Notes (Signed)
This RN introduced herself to the pt, pt is calm however has visible tremors, pt able to state the precipitating factors leading to his coming to the ER. Pt states that he was in his room at home and states that his mom called 911 because he said that he was going to kill himself, pt states that he did say that but that he isn't going to hurt himself, pt denies missing any scheduled doses of his medication, states that sometimes he doesn't always take his seroquel at night to help him sleep because he doesn't need it, pt states that he has a caseworker who comes out to check on him and how he takes his meds and they are in dose packs and he knows how to take them the way he is supposed to. Pt states that he tremors because of his condition, pt denies drinking alcohol. Pt states that an officer talked to him for 4 hours at home prior to him coming to the hospital. Pt denies needing anything to eat at this time, states that he isn't hungry. States that he is ready to go home so he can smoke a cigarette, states that he has been smoking for 30 years, pt offered a nicotine patch and he accepted

## 2022-06-28 DIAGNOSIS — F200 Paranoid schizophrenia: Secondary | ICD-10-CM

## 2022-06-28 MED ORDER — OLANZAPINE 10 MG PO TBDP
10.0000 mg | ORAL_TABLET | Freq: Three times a day (TID) | ORAL | Status: DC | PRN
  Administered 2022-06-28: 10 mg via ORAL
  Filled 2022-06-28 (×2): qty 1

## 2022-06-28 MED ORDER — OLANZAPINE 10 MG IM SOLR: 10.0000 mg | Freq: Three times a day (TID) | INTRAMUSCULAR | Status: DC | PRN

## 2022-06-28 NOTE — Progress Notes (Signed)
This writer went to reassess the effectiveness of PRN Zyprexa. Patient stated "I'm fine, I'm just ready to go. My family is out there waiting on me". This Clinical research associate explained to patient that there were no discharge orders in for him at this time. Patient then states "all they said I had to do was take my shot, I took my shot". This Clinical research associate stated that the doctor would be back on the unit tomorrow and he could discuss discharge with him then. Patient stated "I've been sitting here listening to y'all talk about me for two hours. I don't want to have to bring my lawyer back up here". This Clinical research associate stated to patient again that he would have to wait and talk to the doctor tomorrow about discharge, and he said "ok", and got up from the chair and proceeded to walk back to his room.

## 2022-06-28 NOTE — Progress Notes (Signed)
It was reported to this writer that per treatment team, patient's goal for his stay in the hospital is to do what he needs to do in order to get back to his life.

## 2022-06-28 NOTE — Progress Notes (Signed)
This writer went to go get patient for PRN medication. This Clinical research associate asked patient if he was experiencing any AVH, and he stated "no, I was back here on my phone. I just realized that I had a dial tone. I'm telepathic, when you see me talking to the pillows, that's what I'm doing". Patient was given PRN Zyprexa, in which he tolerated it well, without any issues. Patient remains safe on the unit.

## 2022-06-28 NOTE — [facility] Suicide Risk Assessment (Signed)
[facility] INPATIENT:  Family/Significant Other Suicide Prevention Education  Suicide Prevention Education:  Patient Refusal for Family/Significant Other Suicide Prevention Education: The patient Nicholas Nielsen has refused to provide written consent for family/significant other to be provided Family/Significant Other Suicide Prevention Education during admission and/or prior to discharge.  Physician notified.  SPE completed with pt, as pt refused to consent to family contact. SPI pamphlet provided to pt and pt was encouraged to share information with support network, ask questions, and talk about any concerns relating to SPE. Pt denies access to guns/firearms and verbalized understanding of information provided. Mobile Crisis information also provided to pt.   TAIWO FISH 06/28/2022, 12:47 PM

## 2022-06-28 NOTE — Group Note (Signed)
[facility] LCSW Group Therapy Note   Group Date: 06/28/2022 Start Time: 1300 End Time: 1400   Type of Therapy/Topic:  Group Therapy:  Emotion Regulation  Participation Level:  Did Not Attend    Description of Group:    The purpose of this group is to assist patients in learning to regulate negative emotions and experience positive emotions. Patients will be guided to discuss ways in which they have been vulnerable to their negative emotions. These vulnerabilities will be juxtaposed with experiences of positive emotions or situations, and patients challenged to use positive emotions to combat negative ones. Special emphasis will be placed on coping with negative emotions in conflict situations, and patients will process healthy conflict resolution skills.  Therapeutic Goals: Patient will identify two positive emotions or experiences to reflect on in order to balance out negative emotions:  Patient will label two or more emotions that they find the most difficult to experience:  Patient will be able to demonstrate positive conflict resolution skills through discussion or role plays:   Summary of Patient Progress: Patient declined to attend despite invitation.     Therapeutic Modalities:   Cognitive Behavioral Therapy Feelings Identification Dialectical Behavioral Therapy   Taiven Greenley R Talah Cookston, LCSW 

## 2022-06-28 NOTE — [facility] Counselor (Signed)
Adult Comprehensive Assessment  Patient ID: Nicholas Nielsen, male   DOB: 1981-06-06, 41 y.o.   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  Employment / Job issues: Pt denies. Family Relationships: "I stay to myself a lotEngineer, petroleum / Lack of resources (include bankruptcy): Pt denies. Housing / Lack of housing: Pt denies. Physical health (include injuries & life threatening diseases): "I get kidney stones every once a while" Social relationships: "I talk to them" Substance abuse: "I smoke a marijuana pipe every once and a while." Bereavement / Loss: Pt denies.   Living/Environment/Situation:  Living Arrangements: Parent Living conditions (as described by patient or guardian): States living conditions are WNL. Who else lives in the home?: Patient lives with mother, brother, and sister. How long has patient lived in current situation?: 4-5 years What is atmosphere in current home: Comfortable   Family History:  Marital status: Single Does patient have children?: Yes How many children?: 2 How is patient's relationship with their children?: "I see them periodically"  Childhood History:  Additional childhood history information: "Lived with my parents until 14 or 72 when I moved out." Description of patient's relationship with caregiver when they were a child: "good" Patient's description of current relationship with people who raised him/her: "good" How were you disciplined when you got in trouble as a child/adolescent?: "grounded, sent to my room" Does patient have siblings?: Yes Number of Siblings: 3 Description of patient's current relationship with  siblings: "good, we talk" Did patient suffer any verbal/emotional/physical/sexual abuse as a child?: No Did patient suffer from severe childhood neglect?: No Has patient ever been sexually abused/assaulted/raped as an adolescent or adult?: No Was the patient ever a victim of a crime or a disaster?: No Witnessed domestic violence?: Yes Has patient been affected by domestic violence as an adult?: No Description of domestic violence: "a few times"  Education:  Highest grade of school patient has completed: 10th grade Currently a student?: No Learning disability?: No   Employment/Work Situation:   Employment Situation: Unemployed Patient's Job has Been Impacted by Current Illness: Yes (Patient reports he has been unemployed for 4 years due to his mental health) What is the Longest Time Patient has Held a Job?: 10-11 years 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 Has Patient ever Been in the [employer]?: No   Financial Resources:   Surveyor, quantity resources: Sales executive, Medicaid Does patient have a Lawyer or guardian?: No   Alcohol/Substance Abuse:   What has been your use of drugs/alcohol within the last 12 months?: "I have a pipe and I smoke marijuana in it every once and a while, maybe once every three weeks"  If attempted suicide, did drugs/alcohol play a role in this?:  No Alcohol/Substance Abuse Treatment Hx: Denies past history Has alcohol/substance abuse ever caused legal problems?: No  Social Support System:   Patient's Community Support System: Good Describe Community Support System: "my family" Type of faith/religion: "Christian" How does patient's faith help to cope with current illness?: "I meditate and pray"  Leisure/Recreation:   Do You Have Hobbies?: No  Strengths/Needs:   What is the patient's perception of their strengths?: "I like to work  out" Patient states they can use these personal strengths during their  treatment to contribute to their recovery: Pt denies. Patient states these barriers may affect/interfere with their treatment: Pt denies. Patient states these barriers may affect their return to the community: Pt denies.  Discharge Plan:   Currently receiving community mental health services: Yes (From Whom) Frederich Chick) Patient states concerns and preferences for aftercare planning are: Pt reports plans to continue with current mental health provider. Patient states they will know when they are safe and ready for discharge when: "I'll know" Does patient have access to transportation?: Yes Does patient have financial barriers related to discharge medications?: No Will patient be returning to same living situation after discharge?: Yes  Summary/Recommendations:   Summary and Recommendations (to be completed by the evaluator): Patient is a 41 year old male [place] [facility]  Patient presents to the hospital via the police. Patient was placed under an IVC.  Patient presented with somewhat disorganized speech but was cooperative.  Patient was initially reporting that a Associate Professor" and a "gang stalker" were "spraying urine all over his room and eyes."  He reports that his medications have been inconsistent for the past 10 days.  It was reported that his psychiatric medication and medication for other needs were mixed up and patient became confused.  It was also reported that the patient has bot been sleeping adequately recently.  Patient is current with Frederich Chick, his mental health provider and wishes to continue with their services.  Recommendations include: crisis stabilization, therapeutic milieu, encourage group attendance and participation, medication management for mood stabilization and development of comprehensive mental wellness/sobriety plan.  Harden Mo. 06/28/2022

## 2022-06-28 NOTE — Progress Notes (Signed)
Recreation Therapy Notes      Date: 06/28/2022   Time: 10:25 am   Location: Craft room      Behavioral response: N/A   Intervention Topic: Values    Discussion/Intervention: Patient refused to attend group.    Clinical Observations/Feedback:  Patient refused to attend group.    Roniel Halloran LRT/CTRS        Da Michelle 06/28/2022 11:12 AM 

## 2022-06-28 NOTE — [facility] Suicide Risk Assessment (Signed)
[facility] Admission Suicide Risk Assessment   Nursing information obtained from:  Patient Demographic factors:  Male, Unemployed, Caucasian Current Mental Status:  NA Loss Factors:  NA Historical Factors:  Impulsivity Risk Reduction Factors:  Living with another person, especially a relative  Total Time spent with patient: 30 minutes Chief Complaint:  Psychosis (HCC) [F29]   DX: Schizoaffective disorder, depressed type   History of Present Illness:  Nicholas Nielsen is a 41 year old male with a history of schizoaffective disorder who is admitted to [facility] due to suicidal ideations and worsening psychosis.  Records indicate his mother called 911 because patient voiced that he was going to kill himself.  He had exhibited bizarre behaviors, and there are reports that that he had not been taking his medications.  Patient states that he has been taking his medications ever since his last discharge from [facility] in March 2023.  Indicates that he has a FBI doctor/Sergeant who prescribes his medications.  States that the FBI has been after him for no reason over the past 20 months.  States that they have placed various charges on him including rape which he never took part in.  He had reported on admission that people were zapping his testicles with laser guns; now remarks that he never made this comment.  Tells me that he has telepathic abilities.   Reports feeling "up and down" in terms of his mood.  He had told the emergency room provider that his medications had been messed up for the past 10 days but patient reports that this is not the case.  He does indicate not sleeping for the past 4 days.  Patient does not know why he has been admitted to the hospital.  "Maybe they are just adding more charges."   Denies suicidal or homicidal ideations.  Reports eating well.  He denies have any stressors or challenges in his life at this time.  Reports  occasionally using THC but no other drugs or alcohol.  No h/I.   Continued Clinical Symptoms:  Alcohol Use Disorder Identification Test Final Score (AUDIT): 0 The "Alcohol Use Disorders Identification Test", Guidelines for Use in Primary Care, Second Edition.  World Science writer [facility]). Score between 0-7:  no or low risk or alcohol related problems. Score between 8-15:  moderate risk of alcohol related problems. Score between 16-19:  high risk of alcohol related problems. Score 20 or above:  warrants further diagnostic evaluation for alcohol dependence and treatment.   CLINICAL FACTORS:   Schizophrenia:   Depressive state    Musculoskeletal: Strength & Muscle Tone: within normal limits Gait & Station: normal Patient leans: Left                       Psychiatric Specialty Exam:   Presentation  General Appearance:  Appropriate for Environment   Eye Contact: Good   Speech: Clear and Coherent   Speech Volume: Normal   Handedness:No data recorded   Mood and Affect  Mood: Dysphoric   Affect: Congruent     Thought Process  Thought Processes: Disorganized   Duration of Psychotic Symptoms: unknown Past Diagnosis of Schizophrenia or Psychoactive disorder: Yes   Descriptions of Associations:Loose   Orientation:Full (Time, Place and Person)   Thought Content:Paranoid Ideation; Delusions   Hallucinations:Hallucinations: -- (denies)   Ideas of Reference:Paranoia   Suicidal Thoughts:Suicidal Thoughts: No   Homicidal Thoughts:Homicidal Thoughts: No     Sensorium  Memory: Immediate  Fair   Judgment: Impaired   Insight: Poor     Chartered certified accountant: Fair   Attention Span: Fair   Recall: [employer] of Knowledge: Fair   Language: Fair     Psychomotor Activity  Psychomotor Activity: Psychomotor Activity: Normal     Assets  Assets: Manufacturing systems engineer; Desire for Improvement; Financial Resources/Insurance;  Housing; Resilience     Sleep  Sleep: Sleep: Poor      Physical Exam: Physical Exam ROS Blood pressure 123/83, pulse 79, temperature 98.2 F (36.8 C), temperature source Oral, resp. rate 16, height 6\' 1"  (1.854 m), weight 75.8 kg, SpO2 96 %. Body mass index is 22.03 kg/m.   COGNITIVE FEATURES THAT CONTRIBUTE TO RISK:  None    SUICIDE RISK:   Severe:  Frequent, intense, and enduring suicidal ideation, specific plan, no subjective intent, but some objective markers of intent (i.e., choice of lethal method), the method is accessible, some limited preparatory behavior, evidence of impaired self-control, severe dysphoria/symptomatology, multiple risk factors present, and few if any protective factors, particularly a lack of social support.  PLAN OF CARE:  12/27 Labs and tests reviewed. Patient's lithium level is 0.6  Start Zyprexa 30 mg nightly Restart lithium 600 mg PO BID Patient indicates that he also takes Seroquel.  Will need to confirm this through collateral information/records. There may be elements of medication noncompliance Provide the patient with structure and support Establish and maintain a therapeutic alliance with patient [facility]  I certify that inpatient services furnished can reasonably be expected to improve the patient's condition.   1/28, MD 06/28/2022, 8:26 AM

## 2022-06-28 NOTE — BH IP Treatment Plan (Signed)
Interdisciplinary Treatment and Diagnostic Plan Update  06/28/2022 Time of Session: 9:00AM Nicholas Nielsen MRN: [id number]  Principal Diagnosis: Psychosis [facility])  Secondary Diagnoses: Principal Problem:   Psychosis ([name])   Current Medications:  Current Facility-Administered Medications  Medication Dose Route Frequency Provider Last Rate Last Admin   acetaminophen (TYLENOL) tablet 650 mg  650 mg Oral Q6H PRN Sherlon Handing, NP       alum & mag hydroxide-simeth (MAALOX/MYLANTA) 200-200-20 MG/5ML suspension 30 mL  30 mL Oral Q4H PRN Waldon Merl F, NP       benztropine (COGENTIN) tablet 1 mg  1 mg Oral QHS Waldon Merl F, NP   1 mg at 06/27/22 2125   hydrOXYzine (ATARAX) tablet 50 mg  50 mg Oral TID PRN Sherlon Handing, NP       lithium carbonate (LITHOBID) ER tablet 600 mg  600 mg Oral Q12H Waldon Merl F, NP   600 mg at 06/28/22 7096   magnesium hydroxide (MILK OF MAGNESIA) suspension 30 mL  30 mL Oral Daily PRN Waldon Merl F, NP       nicotine (NICODERM CQ - dosed in mg/24 hours) patch 14 mg  14 mg Transdermal Daily Waldon Merl F, NP   14 mg at 06/28/22 0833   OLANZapine (ZYPREXA) tablet 30 mg  30 mg Oral QHS Waldon Merl F, NP   30 mg at 06/27/22 2125   pantoprazole (PROTONIX) EC tablet 40 mg  40 mg Oral Daily Waldon Merl F, NP   40 mg at 06/28/22 2836   propranolol (INDERAL) tablet 20 mg  20 mg Oral TID Sherlon Handing, NP   20 mg at 06/28/22 0834   temazepam (RESTORIL) capsule 15 mg  15 mg Oral QHS PRN Sherlon Handing, NP   15 mg at 06/27/22 2125   PTA Medications: Medications Prior to Admission  Medication Sig Dispense Refill Last Dose   amLODipine (NORVASC) 5 MG tablet Take 1 tablet (5 mg total) by mouth 2 (two) times daily. (Patient not taking: Reported on 06/27/2022) 20 tablet 0    benztropine (COGENTIN) 1 MG tablet Take 1 tablet (1 mg total) by mouth at bedtime. (Patient not taking: Reported on 06/27/2022) 10 tablet 0    benztropine  (COGENTIN) 2 MG tablet Take 2 mg by mouth at bedtime.      docusate sodium (COLACE) 100 MG capsule Take 1 capsule (100 mg total) by mouth 2 (two) times daily. 20 capsule 0    gabapentin (NEURONTIN) 800 MG tablet Take 800 mg by mouth at bedtime.      hydrOXYzine (ATARAX) 50 MG tablet Take 1 tablet (50 mg total) by mouth 3 (three) times daily as needed for anxiety. 20 tablet 0    lithium carbonate (ESKALITH) 450 MG ER tablet Take 900 mg by mouth at bedtime.      lithium carbonate (LITHOBID) 300 MG CR tablet Take 2 tablets (600 mg total) by mouth every 12 (twelve) hours. (Patient not taking: Reported on 06/27/2022) 40 tablet 0    naltrexone (DEPADE) 50 MG tablet Take 50 mg by mouth at bedtime.      nicotine (NICODERM CQ - DOSED IN MG/24 HOURS) 14 mg/24hr patch Place 1 patch (14 mg total) onto the skin daily. 14 patch 0    OLANZapine (ZYPREXA) 15 MG tablet Take 2 tablets (30 mg total) by mouth at bedtime. (Patient not taking: Reported on 06/27/2022) 20 tablet 0    OLANZapine (ZYPREXA) 20 MG tablet Take 20 mg  by mouth daily.      pantoprazole (PROTONIX) 40 MG tablet Take 1 tablet (40 mg total) by mouth daily. (Patient not taking: Reported on 06/27/2022) 10 tablet 0    propranolol (INDERAL) 20 MG tablet Take 1 tablet (20 mg total) by mouth 3 (three) times daily. 30 tablet 0    QUEtiapine (SEROQUEL) 100 MG tablet Take 300 mg by mouth at bedtime.      rosuvastatin (CRESTOR) 20 MG tablet Take 20 mg by mouth daily.      temazepam (RESTORIL) 15 MG capsule Take 1 capsule (15 mg total) by mouth at bedtime as needed for sleep. 30 capsule 1     Patient Stressors: Legal issue   Marital or family conflict   Medication change or noncompliance    Patient Strengths: Average or above average intelligence  Communication skills  Physical Health  Supportive family/friends   Treatment Modalities: Medication Management, Group therapy, Case management,  1 to 1 session with clinician, Psychoeducation, Recreational  therapy.   Physician Treatment Plan for Primary Diagnosis: Psychosis ([place]) Long Term Goal(s): Improvement in symptoms so as ready for discharge   Short Term Goals: Ability to verbalize feelings will improve Ability to disclose and discuss suicidal ideas Ability to identify changes in lifestyle to reduce recurrence of condition will improve  Medication Management: Evaluate patient's response, side effects, and tolerance of medication regimen.  Therapeutic Interventions: 1 to 1 sessions, Unit Group sessions and Medication administration.  Evaluation of Outcomes: Not Met  Physician Treatment Plan for Secondary Diagnosis: Principal Problem:   Psychosis ([place])  Long Term Goal(s): Improvement in symptoms so as ready for discharge   Short Term Goals: Ability to verbalize feelings will improve Ability to disclose and discuss suicidal ideas Ability to identify changes in lifestyle to reduce recurrence of condition will improve     Medication Management: Evaluate patient's response, side effects, and tolerance of medication regimen.  Therapeutic Interventions: 1 to 1 sessions, Unit Group sessions and Medication administration.  Evaluation of Outcomes: Not Met   RN Treatment Plan for Primary Diagnosis: Psychosis ([place]) Long Term Goal(s): Knowledge of disease and therapeutic regimen to maintain health will improve  Short Term Goals: Ability to demonstrate self-control, Ability to participate in decision making will improve, Ability to verbalize feelings will improve, Ability to disclose and discuss suicidal ideas, Ability to identify and develop effective coping behaviors will improve, and Compliance with prescribed medications will improve  Medication Management: RN will administer medications as ordered by provider, will assess and evaluate patient's response and provide education to patient for prescribed medication. RN will report any adverse and/or side effects to prescribing  provider.  Therapeutic Interventions: 1 on 1 counseling sessions, Psychoeducation, Medication administration, Evaluate responses to treatment, Monitor vital signs and CBGs as ordered, Perform/monitor CIWA, COWS, AIMS and Fall Risk screenings as ordered, Perform wound care treatments as ordered.  Evaluation of Outcomes: Not Met   LCSW Treatment Plan for Primary Diagnosis: Psychosis ([place]) Long Term Goal(s): Safe transition to appropriate next level of care at discharge, Engage patient in therapeutic group addressing interpersonal concerns.  Short Term Goals: Engage patient in aftercare planning with referrals and resources, Increase social support, Increase ability to appropriately verbalize feelings, Increase emotional regulation, Facilitate acceptance of mental health diagnosis and concerns, and Increase skills for wellness and recovery  Therapeutic Interventions: Assess for all discharge needs, 1 to 1 time with Social worker, Explore available resources and support systems, Assess for adequacy in community support network, Educate family and  significant other(s) on suicide prevention, Complete Psychosocial Assessment, Interpersonal group therapy.  Evaluation of Outcomes: Not Met   Progress in Treatment: Attending groups: No. Participating in groups: No. Taking medication as prescribed: Yes. Toleration medication: Yes. Family/Significant other contact made: No, will contact:  once permission is given. Patient understands diagnosis: Yes. Discussing patient identified problems/goals with staff: Yes. Medical problems stabilized or resolved: Yes. Denies suicidal/homicidal ideation: Yes. Issues/concerns per patient self-inventory: No. Other: none  New problem(s) identified: No, Describe:  none  New Short Term/Long Term Goal(s): medication management for mood stabilization; elimination of SI thoughts; development of comprehensive mental wellness/sobriety plan.   Patient Goals:  "do what I  need to do so I can get back to my life"  Discharge Plan or Barriers: CSW to assist with development of appropriate discharge plans.  Patient reports that he is with [facility] and hopes to continue services with them.  He also reports plans to return to the home with his mother.   Reason for Continuation of Hospitalization: Aggression Anxiety Depression Medication stabilization Suicidal ideation  Estimated Length of Stay:  1-7 days  Last 3 [place] Suicide Severity Risk Score: Flowsheet Row Admission (Current) from 06/27/2022 in [place] Most recent reading at 06/27/2022  6:28 PM Admission (Discharged) from 08/30/2021 in [place] Most recent reading at 08/30/2021  8:50 PM ED from 08/30/2021 in [place] Most recent reading at 08/30/2021  3:10 PM  C-SSRS RISK CATEGORY No Risk No Risk No Risk       Last PHQ 2/9 Scores:     No data to display          Scribe for Treatment Team: YASUO PHIMMASONE, LCSW 06/28/2022 11:24 AM

## 2022-06-28 NOTE — Plan of Care (Signed)
D- Patient alert and oriented. Patient presented in a sad, but pleasant mood on assessment stating that he slept good last night, "it's been about four days without it", and had no complaints to voice to this Clinical research associate. Patient endorsed both depression and anxiety, stating "they're up and down, I go and lay down, that's how I deal with it". Although patient denied SI, HI, AVH, and pain at this time, he also stated "not yet, nothing out of the ordinary". Patient had no stated goals for today.  A- Scheduled medications administered to patient, per MD orders. Support and encouragement provided.  Routine safety checks conducted every 15 minutes.  Patient informed to notify staff with problems or concerns.  R- No adverse drug reactions noted. Patient contracts for safety at this time. Patient compliant with medications. Patient receptive, calm, and cooperative. Patient isolates to room, except for meals/medications, and whenever he needs something. Patient, at one point this afternoon believed that he was discharging, so he stripped both beds in his room. When this writer asked patient if he wanted some more linen for overnight, he said "no", and that he was going to leave it like it was. Patient remains safe at this time.  Problem: Education: Goal: Knowledge of [facility] Education information/materials will improve Outcome: Not Progressing Goal: Emotional status will improve Outcome: Not Progressing Goal: Mental status will improve Outcome: Not Progressing Goal: Verbalization of understanding the information provided will improve Outcome: Not Progressing   Problem: Health Behavior/Discharge Planning: Goal: Identification of resources available to assist in meeting health care needs will improve Outcome: Not Progressing Goal: Compliance with treatment plan for underlying cause of condition will improve Outcome: Not Progressing   Problem: Activity: Goal: Will verbalize the importance of  balancing activity with adequate rest periods Outcome: Not Progressing   Problem: Education: Goal: Will be free of psychotic symptoms Outcome: Not Progressing Goal: Knowledge of the prescribed therapeutic regimen will improve Outcome: Not Progressing   Problem: Coping: Goal: Coping ability will improve Outcome: Not Progressing Goal: Will verbalize feelings Outcome: Not Progressing   Problem: Safety: Goal: Ability to redirect hostility and anger into socially appropriate behaviors will improve Outcome: Not Progressing Goal: Ability to remain free from injury will improve Outcome: Not Progressing   Problem: Self-Concept: Goal: Will verbalize positive feelings about self Outcome: Not Progressing

## 2022-06-28 NOTE — H&P (Addendum)
Psychiatric Admission Assessment Adult  Patient Identification: Nicholas Nielsen MRN:  [id number] Date of Evaluation:  06/28/2022 Chief Complaint:  Psychosis ([place]) [F29]  DX: Schizoaffective disorder, depressed type Cannabis use disorder Tobacco use disorder Rule out medication compliance R/o ASD  History of Present Illness:  Mr. Nicholas Nielsen is a 40 year old male with a history of schizoaffective disorder who is admitted to [facility] due to suicidal ideations and worsening psychosis.  Records indicate his mother called 911 because patient voiced that he was going to kill himself.  He had exhibited bizarre behaviors, and there are reports that that he had not been taking his medications.  Patient states that he has been taking his medications ever since his last discharge from [facility] in March 2023.  Indicates that he has a [employer] doctor/Sergeant who prescribes his medications.  States that the FBI has been after him for no reason over the past 20 months.  States that they have placed various charges on him including rape which he never took part in.  He had reported on admission that people were zapping his testicles with laser guns; now remarks that he never made this comment.  Tells me that he has telepathic abilities.  Reports feeling "up and down" in terms of his mood.  He had told the emergency room provider that his medications had been messed up for the past 10 days but patient reports that this is not the case.  He does indicate not sleeping for the past 4 days.  Patient does not know why he has been admitted to the hospital.  "Maybe they are just adding more charges."  Denies suicidal or homicidal ideations.  Reports eating well.  He denies have any stressors or challenges in his life at this time.  Reports occasionally using THC but no other drugs or alcohol.  No h/I.   Total Time spent with patient: 1 hour  Past Psychiatric  History:  Patient with numerous psychiatric admissions.  No suicide attempts.  States that he sees Dr. Reather Converse through [facility].  Is the patient at risk to self? Yes.    Has the patient been a risk to self in the past 6 months? No.  Has the patient been a risk to self within the distant past? Yes.    Is the patient a risk to others? No.  Has the patient been a risk to others in the past 6 months? No.  Has the patient been a risk to others within the distant past? No.   [place] Scale:  [place] Admission (Current) from 06/27/2022 in [facility] Most recent reading at 06/27/2022  6:28 PM Admission (Discharged) from 08/30/2021 in [facility] Most recent reading at 08/30/2021  8:50 PM ED from 08/30/2021 in [facility] Most recent reading at 08/30/2021  3:10 PM  C-SSRS RISK CATEGORY No Risk No Risk No Risk       Alcohol Screening: 1. How often do you have a drink containing alcohol?: Never 2. How many drinks containing alcohol do you have on a typical day when you are drinking?: 1 or 2 3. How often do you have six or more drinks on one occasion?: Never AUDIT-C Score: 0 4. How often during the last year have you found that you were not able to stop drinking once you had started?: Never 5. How often during the last year have you failed to do what was  normally expected from you because of drinking?: Never 6. How often during the last year have you needed a first drink in the morning to get yourself going after a heavy drinking session?: Never 7. How often during the last year have you had a feeling of guilt of remorse after drinking?: Never 8. How often during the last year have you been unable to remember what happened the night before because you had been drinking?: Never 9. Have you or someone else been injured as a result of your drinking?: No 10. Has a relative or friend or a doctor or another  health worker been concerned about your drinking or suggested you cut down?: No Alcohol Use Disorder Identification Test Final Score (AUDIT): 0 Alcohol Brief Interventions/Follow-up: Patient Refused Substance Abuse History in the last 12 months:  No. Consequences of Substance Abuse: NA Previous Psychotropic Medications: Yes  Psychological Evaluations: No  Past Medical History:  Past Medical History:  Diagnosis Date   Back pain    Bipolar 1 disorder ([place])    Hepatitis C    Kidney stones    Schizo affective schizophrenia ([place])    Testicular cyst    History reviewed. No pertinent surgical history. Family History:  Family History  Problem Relation Age of Onset   Stroke Father    Hypertension Father    Family Psychiatric  History: None Tobacco Screening:  Social History   Tobacco Use  Smoking Status Every Day   Packs/day: 1.00   Years: 15.00   Total pack years: 15.00   Types: Cigarettes  Smokeless Tobacco Never    BH Tobacco Counseling     Are you interested in Tobacco Cessation Medications?  Yes, implement Nicotene Replacement Protocol Counseled patient on smoking cessation:  Yes Reason Tobacco Screening Not Completed: No value filed.       Social History:  Social History   Substance and Sexual Activity  Alcohol Use Not Currently     Social History   Substance and Sexual Activity  Drug Use Yes   Frequency: 7.0 times per week   Types: Marijuana   Comment: Daily use; last use is 03/26/2019    Additional Social History:                           Allergies:   Allergies  Allergen Reactions   Quetiapine Fumarate Other (See Comments)    Wake up with "Locked jaw" with high doses, 300-400 mg   Latex Rash   Lab Results:  Results for orders placed or performed during the hospital encounter of 06/27/22 (from the past 48 hour(s))  CBC     Status: Abnormal   Collection Time: 06/27/22  2:08 AM  Result Value Ref Range   WBC 14.9 (H) 4.0 - 10.5 K/uL    RBC 5.24 4.22 - 5.81 MIL/uL   Hemoglobin 16.0 13.0 - 17.0 g/dL   HCT 50.8 39.0 - 52.0 %   MCV 96.9 80.0 - 100.0 fL   MCH 30.5 26.0 - 34.0 pg   MCHC 31.5 30.0 - 36.0 g/dL   RDW 14.2 11.5 - 15.5 %   Platelets 204 150 - 400 K/uL   nRBC 0.0 0.0 - 0.2 %    Comment: Performed at [facility], [address], [place], [place] [address]  Comprehensive metabolic panel     Status: Abnormal   Collection Time: 06/27/22  2:08 AM  Result Value Ref Range   Sodium 138 135 -  145 mmol/L   Potassium 3.5 3.5 - 5.1 mmol/L   Chloride 105 98 - 111 mmol/L   CO2 22 22 - 32 mmol/L   Glucose, Bld 117 (H) 70 - 99 mg/dL    Comment: Glucose reference range applies only to samples taken after fasting for at least 8 hours.   BUN 14 6 - 20 mg/dL   Creatinine, Ser 4.76 0.61 - 1.24 mg/dL   Calcium 9.5 8.9 - 54.6 mg/dL   Total Protein 8.4 (H) 6.5 - 8.1 g/dL   Albumin 4.6 3.5 - 5.0 g/dL   AST 27 15 - 41 U/L   ALT 25 0 - 44 U/L   Alkaline Phosphatase 89 38 - 126 U/L   Total Bilirubin 1.0 0.3 - 1.2 mg/dL   GFR, Estimated >50 >35 mL/min    Comment: (NOTE) Calculated using the CKD-EPI Creatinine Equation (2021)    Anion gap 11 5 - 15    Comment: Performed at [facility], [address], [place], [place] [address]  Acetaminophen level     Status: Abnormal   Collection Time: 06/27/22  2:08 AM  Result Value Ref Range   Acetaminophen (Tylenol), Serum <10 (L) 10 - 30 ug/mL    Comment: (NOTE) Therapeutic concentrations vary significantly. A range of 10-30 ug/mL  may be an effective concentration for many patients. However, some  are best treated at concentrations outside of this range. Acetaminophen concentrations >150 ug/mL at 4 hours after ingestion  and >50 ug/mL at 12 hours after ingestion are often associated with  toxic reactions.  Performed at [facility], [address] [place], [place] [address]   Salicylate level     Status: Abnormal   Collection Time: 06/27/22  2:08 AM   Result Value Ref Range   Salicylate Lvl <7.0 (L) 7.0 - 30.0 mg/dL    Comment: Performed at [facility], [address] [place], [place] [address]  Ethanol     Status: None   Collection Time: 06/27/22  2:08 AM  Result Value Ref Range   Alcohol, Ethyl (B) <10 <10 mg/dL    Comment: (NOTE) Lowest detectable limit for serum alcohol is 10 mg/dL.  For medical purposes only. Performed at [facility], [address] [place], [place] [address]   Urine Drug Screen, Qualitative     Status: None   Collection Time: 06/27/22  2:08 AM  Result Value Ref Range   Tricyclic, Ur Screen NONE DETECTED NONE DETECTED   Amphetamines, Ur Screen NONE DETECTED NONE DETECTED   MDMA (Ecstasy)Ur Screen NONE DETECTED NONE DETECTED   Cocaine Metabolite,Ur [place] NONE DETECTED NONE DETECTED   Opiate, Ur Screen NONE DETECTED NONE DETECTED   Phencyclidine (PCP) Ur S NONE DETECTED NONE DETECTED   Cannabinoid 50 Ng, Ur [place] NONE DETECTED NONE DETECTED   Barbiturates, Ur Screen NONE DETECTED NONE DETECTED   Benzodiazepine, Ur Scrn NONE DETECTED NONE DETECTED   Methadone Scn, Ur NONE DETECTED NONE DETECTED    Comment: (NOTE) Tricyclics + metabolites, urine    Cutoff 1000 ng/mL Amphetamines + metabolites, urine  Cutoff 1000 ng/mL MDMA (Ecstasy), urine              Cutoff 500 ng/mL Cocaine Metabolite, urine          Cutoff 300 ng/mL Opiate + metabolites, urine        Cutoff 300 ng/mL Phencyclidine (PCP), urine         Cutoff 25 ng/mL Cannabinoid, urine  Cutoff 50 ng/mL Barbiturates + metabolites, urine  Cutoff 200 ng/mL Benzodiazepine, urine              Cutoff 200 ng/mL Methadone, urine                   Cutoff 300 ng/mL  The urine drug screen provides only a preliminary, unconfirmed analytical test result and should not be used for non-medical purposes. Clinical consideration and professional judgment should be applied to any positive drug screen result due to  possible interfering substances. A more specific alternate chemical method must be used in order to obtain a confirmed analytical result. Gas chromatography / mass spectrometry (GC/MS) is the preferred confirm atory method. Performed at [facility], [address] [place], [place] [address]   Lithium level     Status: None   Collection Time: 06/27/22  2:08 AM  Result Value Ref Range   Lithium Lvl 0.60 0.60 - 1.20 mmol/L    Comment: Performed at [facility], [address] [place], [place] [address]  Resp panel by RT-PCR (RSV, Flu A&B, Covid) Anterior Nasal Swab     Status: None   Collection Time: 06/27/22  8:46 AM   Specimen: Anterior Nasal Swab  Result Value Ref Range   SARS Coronavirus 2 by RT PCR NEGATIVE NEGATIVE    Comment: (NOTE) SARS-CoV-2 target nucleic acids are NOT DETECTED.  The SARS-CoV-2 RNA is generally detectable in upper respiratory specimens during the acute phase of infection. The lowest concentration of SARS-CoV-2 viral copies this assay can detect is 138 copies/mL. A negative result does not preclude SARS-Cov-2 infection and should not be used as the sole basis for treatment or other patient management decisions. A negative result may occur with  improper specimen collection/handling, submission of specimen other than nasopharyngeal swab, presence of viral mutation(s) within the areas targeted by this assay, and inadequate number of viral copies(<138 copies/mL). A negative result must be combined with clinical observations, patient history, and epidemiological information. The expected result is Negative.  Fact Sheet for Patients:  EntrepreneurPulse.com.au  Fact Sheet for Healthcare Providers:  IncredibleEmployment.be  This test is no t yet approved or cleared by the [place] FDA and  has been authorized for detection and/or diagnosis of SARS-CoV-2 by FDA under an Emergency Use Authorization  (EUA). This EUA will remain  in effect (meaning this test can be used) for the duration of the COVID-19 declaration under Section 564(b)(1) of the Act, 21 U.S.C.section 360bbb-3(b)(1), unless the authorization is terminated  or revoked sooner.       Influenza A by PCR NEGATIVE NEGATIVE   Influenza B by PCR NEGATIVE NEGATIVE    Comment: (NOTE) The Xpert Xpress SARS-CoV-2/FLU/RSV plus assay is intended as an aid in the diagnosis of influenza from Nasopharyngeal swab specimens and should not be used as a sole basis for treatment. Nasal washings and aspirates are unacceptable for Xpert Xpress SARS-CoV-2/FLU/RSV testing.  Fact Sheet for Patients: EntrepreneurPulse.com.au  Fact Sheet for Healthcare Providers: IncredibleEmployment.be  This test is not yet approved or cleared by the [place] FDA and has been authorized for detection and/or diagnosis of SARS-CoV-2 by FDA under an Emergency Use Authorization (EUA). This EUA will remain in effect (meaning this test can be used) for the duration of the COVID-19 declaration under Section 564(b)(1) of the Act, 21 U.S.C. section 360bbb-3(b)(1), unless the authorization is terminated or revoked.     Resp Syncytial Virus by PCR NEGATIVE NEGATIVE  Comment: (NOTE) Fact Sheet for Patients: EntrepreneurPulse.com.au  Fact Sheet for Healthcare Providers: IncredibleEmployment.be  This test is not yet approved or cleared by the [place] FDA and has been authorized for detection and/or diagnosis of SARS-CoV-2 by FDA under an Emergency Use Authorization (EUA). This EUA will remain in effect (meaning this test can be used) for the duration of the COVID-19 declaration under Section 564(b)(1) of the Act, 21 U.S.C. section 360bbb-3(b)(1), unless the authorization is terminated or revoked.  Performed at [facility], [address] [place], [place]  [address]     Blood Alcohol level:  Lab Results  Component Value Date   [facility] <10 06/27/2022   ETH <10 [id number]    Metabolic Disorder Labs:  Lab Results  Component Value Date   HGBA1C 5.2 08/31/2021   MPG 103 08/31/2021   MPG 96.8 05/05/2021   No results found for: "PROLACTIN" Lab Results  Component Value Date   CHOL 207 (H) 08/31/2021   TRIG 265 (H) 08/31/2021   HDL 31 (L) 08/31/2021   CHOLHDL 6.7 08/31/2021   VLDL 53 (H) 08/31/2021   LDLCALC 123 (H) 08/31/2021   LDLCALC 150 (H) 05/05/2021    Current Medications: Current Facility-Administered Medications  Medication Dose Route Frequency Provider Last Rate Last Admin   acetaminophen (TYLENOL) tablet 650 mg  650 mg Oral Q6H PRN Sherlon Handing, NP       alum & mag hydroxide-simeth (MAALOX/MYLANTA) 200-200-20 MG/5ML suspension 30 mL  30 mL Oral Q4H PRN Sherlon Handing, NP       benztropine (COGENTIN) tablet 1 mg  1 mg Oral QHS Waldon Merl F, NP   1 mg at 06/27/22 2125   hydrOXYzine (ATARAX) tablet 50 mg  50 mg Oral TID PRN Sherlon Handing, NP       lithium carbonate (LITHOBID) ER tablet 600 mg  600 mg Oral Q12H Waldon Merl F, NP   600 mg at 06/27/22 2125   magnesium hydroxide (MILK OF MAGNESIA) suspension 30 mL  30 mL Oral Daily PRN Waldon Merl F, NP       nicotine (NICODERM CQ - dosed in mg/24 hours) patch 14 mg  14 mg Transdermal Daily Waldon Merl F, NP       OLANZapine (ZYPREXA) tablet 30 mg  30 mg Oral QHS Waldon Merl F, NP   30 mg at 06/27/22 2125   pantoprazole (PROTONIX) EC tablet 40 mg  40 mg Oral Daily Waldon Merl F, NP       propranolol (INDERAL) tablet 20 mg  20 mg Oral TID Waldon Merl F, NP       temazepam (RESTORIL) capsule 15 mg  15 mg Oral QHS PRN Sherlon Handing, NP   15 mg at 06/27/22 2125   PTA Medications: Medications Prior to Admission  Medication Sig Dispense Refill Last Dose   amLODipine (NORVASC) 5 MG tablet Take 1 tablet (5 mg total) by mouth 2 (two)  times daily. (Patient not taking: Reported on 06/27/2022) 20 tablet 0    benztropine (COGENTIN) 1 MG tablet Take 1 tablet (1 mg total) by mouth at bedtime. (Patient not taking: Reported on 06/27/2022) 10 tablet 0    benztropine (COGENTIN) 2 MG tablet Take 2 mg by mouth at bedtime.      docusate sodium (COLACE) 100 MG capsule Take 1 capsule (100 mg total) by mouth 2 (two) times daily. 20 capsule 0    gabapentin (NEURONTIN) 800 MG tablet Take 800 mg by mouth at  bedtime.      hydrOXYzine (ATARAX) 50 MG tablet Take 1 tablet (50 mg total) by mouth 3 (three) times daily as needed for anxiety. 20 tablet 0    lithium carbonate (ESKALITH) 450 MG ER tablet Take 900 mg by mouth at bedtime.      lithium carbonate (LITHOBID) 300 MG CR tablet Take 2 tablets (600 mg total) by mouth every 12 (twelve) hours. (Patient not taking: Reported on 06/27/2022) 40 tablet 0    naltrexone (DEPADE) 50 MG tablet Take 50 mg by mouth at bedtime.      nicotine (NICODERM CQ - DOSED IN MG/24 HOURS) 14 mg/24hr patch Place 1 patch (14 mg total) onto the skin daily. 14 patch 0    OLANZapine (ZYPREXA) 15 MG tablet Take 2 tablets (30 mg total) by mouth at bedtime. (Patient not taking: Reported on 06/27/2022) 20 tablet 0    OLANZapine (ZYPREXA) 20 MG tablet Take 20 mg by mouth daily.      pantoprazole (PROTONIX) 40 MG tablet Take 1 tablet (40 mg total) by mouth daily. (Patient not taking: Reported on 06/27/2022) 10 tablet 0    propranolol (INDERAL) 20 MG tablet Take 1 tablet (20 mg total) by mouth 3 (three) times daily. 30 tablet 0    QUEtiapine (SEROQUEL) 100 MG tablet Take 300 mg by mouth at bedtime.      rosuvastatin (CRESTOR) 20 MG tablet Take 20 mg by mouth daily.      temazepam (RESTORIL) 15 MG capsule Take 1 capsule (15 mg total) by mouth at bedtime as needed for sleep. 30 capsule 1     Musculoskeletal: Strength & Muscle Tone: within normal limits Gait & Station: normal Patient leans: Left            Psychiatric  Specialty Exam:  Presentation  General Appearance:  Appropriate for Environment  Eye Contact: Good  Speech: Clear and Coherent  Speech Volume: Normal  Handedness:No data recorded  Mood and Affect  Mood: Dysphoric  Affect: Congruent   Thought Process  Thought Processes: Disorganized  Duration of Psychotic Symptoms: unknown Past Diagnosis of Schizophrenia or Psychoactive disorder: Yes  Descriptions of Associations:Loose  Orientation:Full (Time, Place and Person)  Thought Content:Paranoid Ideation; Delusions  Hallucinations:Hallucinations: -- (denies)  Ideas of Reference:Paranoia  Suicidal Thoughts:Suicidal Thoughts: No  Homicidal Thoughts:Homicidal Thoughts: No   Sensorium  Memory: Immediate Fair  Judgment: Impaired  Insight: Poor   Executive Functions  Concentration: Fair  Attention Span: Fair  Recall: [place] of Knowledge: Fair  Language: Fair   Psychomotor Activity  Psychomotor Activity: Psychomotor Activity: Normal   Assets  Assets: Communication Skills; Desire for Improvement; Financial Resources/Insurance; Housing; Resilience   Sleep  Sleep: Sleep: Poor    Physical Exam: Physical Exam ROS Blood pressure 123/83, pulse 79, temperature 98.2 F (36.8 C), temperature source Oral, resp. rate 16, height 6\' 1"  (1.854 m), weight 75.8 kg, SpO2 96 %. Body mass index is 22.03 kg/m.  Treatment Plan Summary: Daily contact with patient to assess and evaluate symptoms and progress in treatment and Medication management  Observation Level/Precautions:  suicide precautions  Laboratory:    Psychotherapy:    Medications:    Consultations:    Discharge Concerns:    Estimated LOS:  Other:     Physician Treatment Plan for Primary Diagnosis: Psychosis ([place]) Long Term Goal(s): Improvement in symptoms so as ready for discharge  Short Term Goals: Ability to identify changes in lifestyle to reduce recurrence of condition will  improve and  Ability to verbalize feelings will improve  Physician Treatment Plan for Secondary Diagnosis: Principal Problem:   Psychosis ([place])  Long Term Goal(s): Improvement in symptoms so as ready for discharge  Short Term Goals: Ability to verbalize feelings will improve and Ability to disclose and discuss suicidal ideas  12/27 Labs and tests reviewed. Patient's lithium level is 0.6  Start Zyprexa 30 mg nightly Restart lithium 600 mg PO BID Patient indicates that he also takes Seroquel.  Will need to confirm this through collateral information/records. There may be elements of medication noncompliance Provide the patient with structure and support Establish and maintain a therapeutic alliance with patient [facility]   I certify that inpatient services furnished can reasonably be expected to improve the patient's condition.    Rulon Sera, MD 12/27/20238:17 AM

## 2022-06-29 DIAGNOSIS — F200 Paranoid schizophrenia: Secondary | ICD-10-CM | POA: Diagnosis not present

## 2022-06-29 LAB — RESP PANEL BY RT-PCR (RSV, FLU A&B, COVID)  RVPGX2
Influenza A by PCR: NEGATIVE
Influenza B by PCR: NEGATIVE
Resp Syncytial Virus by PCR: NEGATIVE
SARS Coronavirus 2 by RT PCR: NEGATIVE

## 2022-06-29 NOTE — Group Note (Signed)
[facility] LCSW Group Therapy Note    Group Date: 06/29/2022 Start Time: 1300 End Time: 1400  Type of Therapy and Topic:  Group Therapy:  Overcoming Obstacles  Participation Level:  [facility] PARTICIPATION LEVEL: Did Not Attend  Mood:  Description of Group:   In this group patients will be encouraged to explore what they see as obstacles to their own wellness and recovery. They will be guided to discuss their thoughts, feelings, and behaviors related to these obstacles. The group will process together ways to cope with barriers, with attention given to specific choices patients can make. Each patient will be challenged to identify changes they are motivated to make in order to overcome their obstacles. This group will be process-oriented, with patients participating in exploration of their own experiences as well as giving and receiving support and challenge from other group members.  Therapeutic Goals: 1. Patient will identify personal and current obstacles as they relate to admission. 2. Patient will identify barriers that currently interfere with their wellness or overcoming obstacles.  3. Patient will identify feelings, thought process and behaviors related to these barriers. 4. Patient will identify two changes they are willing to make to overcome these obstacles:    Summary of Patient Progress   Patient declined to attend group despite being encouraged to do so by this clinician.    Therapeutic Modalities:   Cognitive Behavioral Therapy Solution Focused Therapy Motivational Interviewing Relapse Prevention Therapy   Shelby Anderle J Beuford Garcilazo, LCSW 

## 2022-06-29 NOTE — Progress Notes (Signed)
Recreation Therapy Notes   Date: 06/29/2022  Time: 10:00 am  Location: Craft room     Behavioral response: N/A   Intervention Topic: Coping Skills   Discussion/Intervention: Patient refused to attend group.   Clinical Observations/Feedback:  Patient refused to attend group.    Geremy Rister LRT/CTRS        Nicholas Nielsen 06/29/2022 11:18 AM 

## 2022-06-29 NOTE — Plan of Care (Signed)
Pt is isolative, denies SI HI, paranoid.  Pt is generally calm and cooperative when on unit.  Pt states he has a good appetite and slept well.  Compliant with medications.  Continued monitoring via q 15 minute checks.

## 2022-06-29 NOTE — Progress Notes (Signed)
[facility] MD Progress Note  06/29/2022 4:38 PM Nicholas Nielsen  MRN:  [id number] Subjective: Follow-up 41 year old man with schizophrenia.  Patient was in bed today and not getting up to do much.  Eyes closed.  He did respond to questions and admitted that he was still having hallucinations.  Disorganized and bizarre in his speech.  Tolerating medicine.  No new physical complaints. Principal Problem: Psychosis (HCC) Diagnosis: Principal Problem:   Psychosis (HCC)  Total Time spent with patient: 30 minutes  Past Psychiatric History: Past history of schizophrenia  Past Medical History:  Past Medical History:  Diagnosis Date   Back pain    Bipolar 1 disorder (HCC)    Hepatitis C    Kidney stones    Schizo affective schizophrenia [facility])    Testicular cyst    History reviewed. No pertinent surgical history. Family History:  Family History  Problem Relation Age of Onset   Stroke Father    Hypertension Father    Family Psychiatric  History: See previous Social History:  Social History   Substance and Sexual Activity  Alcohol Use Not Currently     Social History   Substance and Sexual Activity  Drug Use Yes   Frequency: 7.0 times per week   Types: Marijuana   Comment: Daily use; last use is 03/26/2019    Social History   Socioeconomic History   Marital status: Single    Spouse name: Not on file   Number of children: Not on file   Years of education: Not on file   Highest education level: Not on file  Occupational History   Occupation: Unemployed  Tobacco Use   Smoking status: Every Day    Packs/day: 1.00    Years: 15.00    Total pack years: 15.00    Types: Cigarettes   Smokeless tobacco: Never  Vaping Use   Vaping Use: Never used  Substance and Sexual Activity   Alcohol use: Not Currently   Drug use: Yes    Frequency: 7.0 times per week    Types: Marijuana    Comment: Daily use; last use is 03/26/2019   Sexual activity: Never    Birth control/protection: Condom   Other Topics Concern   Not on file  Social History Narrative   Pt stated that he is currently living with his brother.  He lives in [place] and is unemployed.  Pt stated that he recently arranged an appointment with [facility].   Social Determinants of Health   Financial Resource Strain: Not on file  Food Insecurity: Food Insecurity Present (06/27/2022)   Hunger Vital Sign    Worried About Running Out of Food in the Last Year: Sometimes true    Ran Out of Food in the Last Year: Never true  Transportation Needs: Unmet Transportation Needs (06/27/2022)   PRAPARE - Administrator, Civil Service (Medical): Yes    Lack of Transportation (Non-Medical): Yes  Physical Activity: Not on file  Stress: Not on file  Social Connections: Not on file   Additional Social History:                         Sleep: Fair  Appetite:  Fair  Current Medications: Current Facility-Administered Medications  Medication Dose Route Frequency Provider Last Rate Last Admin   acetaminophen (TYLENOL) tablet 650 mg  650 mg Oral Q6H PRN Vanetta Mulders, NP       alum & mag hydroxide-simeth (MAALOX/MYLANTA) 200-200-20 MG/5ML  suspension 30 mL  30 mL Oral Q4H PRN Gabriel Cirri F, NP       benztropine (COGENTIN) tablet 1 mg  1 mg Oral QHS Gabriel Cirri F, NP   1 mg at 06/28/22 2024   hydrOXYzine (ATARAX) tablet 50 mg  50 mg Oral TID PRN Vanetta Mulders, NP   50 mg at 06/28/22 2024   lithium carbonate (LITHOBID) ER tablet 600 mg  600 mg Oral Q12H Gabriel Cirri F, NP   600 mg at 06/29/22 0847   magnesium hydroxide (MILK OF MAGNESIA) suspension 30 mL  30 mL Oral Daily PRN Gabriel Cirri F, NP       nicotine (NICODERM CQ - dosed in mg/24 hours) patch 14 mg  14 mg Transdermal Daily Gabriel Cirri F, NP   14 mg at 06/29/22 0849   OLANZapine zydis (ZYPREXA) disintegrating tablet 10 mg  10 mg Oral TID PRN Reggie Pile, MD   10 mg at 06/28/22 1500   Or   OLANZapine (ZYPREXA) injection  10 mg  10 mg Intramuscular TID PRN Reggie Pile, MD       OLANZapine (ZYPREXA) tablet 30 mg  30 mg Oral QHS Gabriel Cirri F, NP   30 mg at 06/28/22 2024   pantoprazole (PROTONIX) EC tablet 40 mg  40 mg Oral Daily Gabriel Cirri F, NP   40 mg at 06/29/22 0847   propranolol (INDERAL) tablet 20 mg  20 mg Oral TID Vanetta Mulders, NP   20 mg at 06/29/22 1351   temazepam (RESTORIL) capsule 15 mg  15 mg Oral QHS PRN Vanetta Mulders, NP   15 mg at 06/28/22 2024    Lab Results:  Results for orders placed or performed during the hospital encounter of 06/27/22 (from the past 48 hour(s))  Resp panel by RT-PCR (RSV, Flu A&B, Covid) Anterior Nasal Swab     Status: None   Collection Time: 06/29/22 11:12 AM   Specimen: Anterior Nasal Swab  Result Value Ref Range   SARS Coronavirus 2 by RT PCR NEGATIVE NEGATIVE    Comment: (NOTE) SARS-CoV-2 target nucleic acids are NOT DETECTED.  The SARS-CoV-2 RNA is generally detectable in upper respiratory specimens during the acute phase of infection. The lowest concentration of SARS-CoV-2 viral copies this assay can detect is 138 copies/mL. A negative result does not preclude SARS-Cov-2 infection and should not be used as the sole basis for treatment or other patient management decisions. A negative result may occur with  improper specimen collection/handling, submission of specimen other than nasopharyngeal swab, presence of viral mutation(s) within the areas targeted by this assay, and inadequate number of viral copies(<138 copies/mL). A negative result must be combined with clinical observations, patient history, and epidemiological information. The expected result is Negative.  Fact Sheet for Patients:  BloggerCourse.com  Fact Sheet for Healthcare Providers:  SeriousBroker.it  This test is no t yet approved or cleared by the [place] FDA and  has been authorized for detection and/or  diagnosis of SARS-CoV-2 by FDA under an Emergency Use Authorization (EUA). This EUA will remain  in effect (meaning this test can be used) for the duration of the COVID-19 declaration under Section 564(b)(1) of the Act, 21 U.S.C.section 360bbb-3(b)(1), unless the authorization is terminated  or revoked sooner.       Influenza A by PCR NEGATIVE NEGATIVE   Influenza B by PCR NEGATIVE NEGATIVE    Comment: (NOTE) The Xpert Xpress SARS-CoV-2/FLU/RSV plus assay is intended as an aid in  the diagnosis of influenza from Nasopharyngeal swab specimens and should not be used as a sole basis for treatment. Nasal washings and aspirates are unacceptable for Xpert Xpress SARS-CoV-2/FLU/RSV testing.  Fact Sheet for Patients: BloggerCourse.com  Fact Sheet for Healthcare Providers: SeriousBroker.it  This test is not yet approved or cleared by the [place] FDA and has been authorized for detection and/or diagnosis of SARS-CoV-2 by FDA under an Emergency Use Authorization (EUA). This EUA will remain in effect (meaning this test can be used) for the duration of the COVID-19 declaration under Section 564(b)(1) of the Act, 21 U.S.C. section 360bbb-3(b)(1), unless the authorization is terminated or revoked.     Resp Syncytial Virus by PCR NEGATIVE NEGATIVE    Comment: (NOTE) Fact Sheet for Patients: BloggerCourse.com  Fact Sheet for Healthcare Providers: SeriousBroker.it  This test is not yet approved or cleared by the [place] FDA and has been authorized for detection and/or diagnosis of SARS-CoV-2 by FDA under an Emergency Use Authorization (EUA). This EUA will remain in effect (meaning this test can be used) for the duration of the COVID-19 declaration under Section 564(b)(1) of the Act, 21 U.S.C. section 360bbb-3(b)(1), unless the authorization is terminated or revoked.  Performed  at [facility], [address] [place], [place] [address]     Blood Alcohol level:  Lab Results  Component Value Date   [facility] <10 06/27/2022   ETH <10 08/30/2021    Metabolic Disorder Labs: Lab Results  Component Value Date   HGBA1C 5.2 08/31/2021   MPG 103 08/31/2021   MPG 96.8 05/05/2021   No results found for: "PROLACTIN" Lab Results  Component Value Date   CHOL 207 (H) 08/31/2021   TRIG 265 (H) 08/31/2021   HDL 31 (L) 08/31/2021   CHOLHDL 6.7 08/31/2021   VLDL 53 (H) 08/31/2021   LDLCALC 123 (H) 08/31/2021   LDLCALC 150 (H) 05/05/2021    Physical Findings: AIMS:  , ,  ,  ,    CIWA:    COWS:     Musculoskeletal: Strength & Muscle Tone: within normal limits Gait & Station: normal Patient leans: N/A  Psychiatric Specialty Exam:  Presentation  General Appearance:  Appropriate for Environment  Eye Contact: Good  Speech: Clear and Coherent  Speech Volume: Normal  Handedness:No data recorded  Mood and Affect  Mood: Dysphoric  Affect: Congruent   Thought Process  Thought Processes: Disorganized  Descriptions of Associations:Loose  Orientation:Full (Time, Place and Person)  Thought Content:Paranoid Ideation; Delusions  History of Schizophrenia/Schizoaffective disorder:Yes  Duration of Psychotic Symptoms:Greater than six months  Hallucinations:No data recorded Ideas of Reference:Paranoia  Suicidal Thoughts:No data recorded Homicidal Thoughts:No data recorded  Sensorium  Memory: Immediate Fair  Judgment: Impaired  Insight: Poor   Executive Functions  Concentration: Fair  Attention Span: Fair  Recall: [employer] of Knowledge: Fair  Language: Fair   Psychomotor Activity  Psychomotor Activity:No data recorded  Assets  Assets: Communication Skills; Desire for Improvement; Financial Resources/Insurance; Housing; Resilience   Sleep  Sleep:No data recorded   Physical Exam: Physical Exam Vitals  and nursing note reviewed.  Constitutional:      Appearance: Normal appearance.  HENT:     Head: Normocephalic and atraumatic.     Mouth/Throat:     Pharynx: Oropharynx is clear.  Eyes:     Pupils: Pupils are equal, round, and reactive to light.  Cardiovascular:     Rate and Rhythm: Normal rate and regular rhythm.  Pulmonary:  Effort: Pulmonary effort is normal.     Breath sounds: Normal breath sounds.  Abdominal:     General: Abdomen is flat.     Palpations: Abdomen is soft.  Musculoskeletal:        General: Normal range of motion.  Skin:    General: Skin is warm and dry.  Neurological:     General: No focal deficit present.     Mental Status: He is alert. Mental status is at baseline.  Psychiatric:        Attention and Perception: He is inattentive.        Mood and Affect: Mood normal. Affect is blunt.        Speech: Speech is delayed.        Behavior: Behavior is slowed.        Thought Content: Thought content is paranoid and delusional.        Cognition and Memory: Cognition is impaired.    Review of Systems  Constitutional: Negative.   HENT: Negative.    Eyes: Negative.   Respiratory: Negative.    Cardiovascular: Negative.   Gastrointestinal: Negative.   Musculoskeletal: Negative.   Skin: Negative.   Neurological: Negative.   Psychiatric/Behavioral:  Positive for hallucinations. Negative for depression, substance abuse and suicidal ideas. The patient is nervous/anxious.    Blood pressure (!) 124/92, pulse 74, temperature 97.9 F (36.6 C), temperature source Oral, resp. rate 18, height 6\' 1"  (1.854 m), weight 75.8 kg, SpO2 100 %. Body mass index is 22.03 kg/m.   Treatment Plan Summary: Medication management and Plan reviewed medication.  Appropriate combination that he has responded to well in the past.  No change to medicine.  Supportive counseling.  Daily reassessment.  Mordecai RasmussenJohn Jayr Lupercio, MD 06/29/2022, 4:38 PM

## 2022-06-29 NOTE — Progress Notes (Signed)
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 Prns given, patient continued to stand at door of bedroom stating that there was a gun under the other bed and he could not go into room until they came in to get it. Prns given with good relief. Pt noted sleeping this shift afterwards. Denies Si, HI, Encouragement and support provided. Safety checks maintained. Medications given as prescribed. Pt receptive and remains safe on unit with q 15 min checks.

## 2022-06-30 DIAGNOSIS — F200 Paranoid schizophrenia: Secondary | ICD-10-CM | POA: Diagnosis not present

## 2022-06-30 NOTE — Progress Notes (Signed)
Recreation Therapy Notes  INPATIENT RECREATION THERAPY ASSESSMENT  Patient Details Name: Nicholas Nielsen MRN: [id number] DOB: 05-Nov-1980 Today's Date: 06/30/2022       Information Obtained From: Chart Review  Able to Participate in Assessment/Interview:    Patient Presentation:    Reason for Admission (Per Patient): Active Symptoms  Patient Stressors:    Coping Skills:   Exercise, Meditate, Prayer  Leisure Interests (2+):  Exercise - Aerobics Class, Exercise - Running  Frequency of Recreation/Participation:    Awareness of Community Resources:  Yes  Community Resources:  Other (Comment) [facility])  Current Use: Yes  If no, Barriers?:    Expressed Interest in [employer] Information:    [place] of Residence:  [place]  Patient Main Form of Transportation:    Patient Strengths:  working out  Patient Identified Areas of Improvement:  N/A  Patient Goal for Hospitalization:  Get out and work a little bit.  Current SI (including self-harm):     Current HI:     Current AVH:    Staff Intervention Plan: Group Attendance, Collaborate with Interdisciplinary Treatment Team  Consent to Intern Participation: N/A  Talisha Erby 06/30/2022, 3:19 PM

## 2022-06-30 NOTE — Progress Notes (Signed)
Pt denies SI/HI/AVH and verbally agrees to approach staff if these become apparent or before harming themselves/others. Rates depression 7/10. Rates anxiety 5/10. Rates pain 0/10. Pt will pace in room from time to time. Pt does not seem to be displaying AVH. Pt states that he has had a fine day. Pt is pleasant and compliant. Scheduled medications administered to pt, per MD orders. RN provided support and encouragement to pt. Q15 min safety checks implemented and continued. Pt safe on the unit. RN will continue to monitor and intervene as needed.  06/30/22 0811  Psych Admission Type (Psych Patients Only)  Admission Status Involuntary  Psychosocial Assessment  Patient Complaints Anxiety;Depression  Eye Contact Brief  Facial Expression Worried;Blank  Affect Anxious;Depressed  Speech Logical/coherent;Soft  Interaction Assertive  Motor Activity Slow  Appearance/Hygiene Unremarkable  Behavior Characteristics Cooperative;Appropriate to situation;Anxious  Mood Anxious;Sad  Thought Process  Coherency Disorganized  Content Delusions  Delusions Paranoid  Perception Hallucinations  Hallucination Auditory  Judgment Impaired  Confusion None  Danger to Self  Current suicidal ideation? Denies  Danger to Others  Danger to Others None reported or observed

## 2022-06-30 NOTE — Progress Notes (Signed)
Patient is pleasant and cooperative. He remains preoccupied with his own thoughts. Often seen talking to himself while walking in the halls and while in his room alone. He is med complaint and took his meds without issue.  He denies si  hi. Also denies avh, but clearly responding to internal stimuli.Q15 min safety checks in place.  Will continue to monitor.      C Branch-Nicholson, LPN

## 2022-06-30 NOTE — Plan of Care (Signed)
  Problem: Activity: Goal: Will verbalize the importance of balancing activity with adequate rest periods Outcome: Not Progressing   Problem: Education: Goal: Will be free of psychotic symptoms Outcome: Not Progressing   Problem: Coping: Goal: Will verbalize feelings Outcome: Not Progressing

## 2022-06-30 NOTE — Group Note (Signed)
LCSW Group Therapy Note  Group Date: 06/30/2022 Start Time: 1300 End Time: 1400   Type of Therapy and Topic:  Group Therapy - Healthy vs Unhealthy Coping Skills  Participation Level:  Did Not Attend   Description of Group The focus of this group was to determine what unhealthy coping techniques typically are used by group members and what healthy coping techniques would be helpful in coping with various problems. Patients were guided in becoming aware of the differences between healthy and unhealthy coping techniques. Patients were asked to identify 2-3 healthy coping skills they would like to learn to use more effectively.  Therapeutic Goals Patients learned that coping is what human beings do all day long to deal with various situations in their lives Patients defined and discussed healthy vs unhealthy coping techniques Patients identified their preferred coping techniques and identified whether these were healthy or unhealthy Patients determined 2-3 healthy coping skills they would like to become more familiar with and use more often. Patients provided support and ideas to each other   Summary of Patient Progress:  Pt did not attend despite invitation.    Therapeutic Modalities Cognitive Behavioral Therapy Motivational Interviewing  Glenis Smoker, Theresia Majors 06/30/2022  3:29 PM

## 2022-06-30 NOTE — Plan of Care (Signed)
  Problem: Health Behavior/Discharge Planning: Goal: Compliance with treatment plan for underlying cause of condition will improve Outcome: Progressing   Problem: Coping: Goal: Coping ability will improve Outcome: Progressing Goal: Will verbalize feelings Outcome: Progressing

## 2022-06-30 NOTE — Progress Notes (Signed)
Recreation Therapy Notes   Date: 06/30/2022  Time: 10:20 am  Location: Craft room     Behavioral response: N/A   Intervention Topic: Stress Management   Discussion/Intervention: Patient refused to attend group.   Clinical Observations/Feedback:  Patient refused to attend group.    Deondria Puryear LRT/CTRS        Analysia Dungee 06/30/2022 12:05 PM

## 2022-06-30 NOTE — Progress Notes (Signed)
[facility] MD Progress Note  06/30/2022 1:39 PM Nicholas Nielsen  MRN:  [id number] Subjective: Patient seen and chart reviewed.  Patient continues to endorse hallucinations.  Stays withdrawn.  Still feeling confused and paranoid.  No new complaint Principal Problem: Psychosis (HCC) Diagnosis: Principal Problem:   Psychosis (HCC)  Total Time spent with patient: 30 minutes  Past Psychiatric History: Past history of schizophrenia  Past Medical History:  Past Medical History:  Diagnosis Date   Back pain    Bipolar 1 disorder (HCC)    Hepatitis C    Kidney stones    Schizo affective schizophrenia [facility])    Testicular cyst    History reviewed. No pertinent surgical history. Family History:  Family History  Problem Relation Age of Onset   Stroke Father    Hypertension Father    Family Psychiatric  History: See previous Social History:  Social History   Substance and Sexual Activity  Alcohol Use Not Currently     Social History   Substance and Sexual Activity  Drug Use Yes   Frequency: 7.0 times per week   Types: Marijuana   Comment: Daily use; last use is 03/26/2019    Social History   Socioeconomic History   Marital status: Single    Spouse name: Not on file   Number of children: Not on file   Years of education: Not on file   Highest education level: Not on file  Occupational History   Occupation: Unemployed  Tobacco Use   Smoking status: Every Day    Packs/day: 1.00    Years: 15.00    Total pack years: 15.00    Types: Cigarettes   Smokeless tobacco: Never  Vaping Use   Vaping Use: Never used  Substance and Sexual Activity   Alcohol use: Not Currently   Drug use: Yes    Frequency: 7.0 times per week    Types: Marijuana    Comment: Daily use; last use is 03/26/2019   Sexual activity: Never    Birth control/protection: Condom  Other Topics Concern   Not on file  Social History Narrative   Pt stated that he is currently living with his brother.  He lives in  [place] and is unemployed.  Pt stated that he recently arranged an appointment with [facility].   Social Determinants of Health   Financial Resource Strain: Not on file  Food Insecurity: Food Insecurity Present (06/27/2022)   Hunger Vital Sign    Worried About Running Out of Food in the Last Year: Sometimes true    Ran Out of Food in the Last Year: Never true  Transportation Needs: Unmet Transportation Needs (06/27/2022)   PRAPARE - Administrator, Civil Service (Medical): Yes    Lack of Transportation (Non-Medical): Yes  Physical Activity: Not on file  Stress: Not on file  Social Connections: Not on file   Additional Social History:                         Sleep: Fair  Appetite:  Fair  Current Medications: Current Facility-Administered Medications  Medication Dose Route Frequency Provider Last Rate Last Admin   acetaminophen (TYLENOL) tablet 650 mg  650 mg Oral Q6H PRN Vanetta Mulders, NP       alum & mag hydroxide-simeth (MAALOX/MYLANTA) 200-200-20 MG/5ML suspension 30 mL  30 mL Oral Q4H PRN Gabriel Cirri F, NP       benztropine (COGENTIN) tablet 1 mg  1  mg Oral QHS Gabriel Cirri F, NP   1 mg at 06/29/22 2124   hydrOXYzine (ATARAX) tablet 50 mg  50 mg Oral TID PRN Vanetta Mulders, NP   50 mg at 06/30/22 0811   lithium carbonate (LITHOBID) ER tablet 600 mg  600 mg Oral Q12H Gabriel Cirri F, NP   600 mg at 06/30/22 9476   magnesium hydroxide (MILK OF MAGNESIA) suspension 30 mL  30 mL Oral Daily PRN Gabriel Cirri F, NP       nicotine (NICODERM CQ - dosed in mg/24 hours) patch 14 mg  14 mg Transdermal Daily Gabriel Cirri F, NP   14 mg at 06/30/22 0812   OLANZapine zydis (ZYPREXA) disintegrating tablet 10 mg  10 mg Oral TID PRN Reggie Pile, MD   10 mg at 06/28/22 1500   Or   OLANZapine (ZYPREXA) injection 10 mg  10 mg Intramuscular TID PRN Reggie Pile, MD       OLANZapine (ZYPREXA) tablet 30 mg  30 mg Oral QHS Gabriel Cirri F, NP   30  mg at 06/29/22 2124   pantoprazole (PROTONIX) EC tablet 40 mg  40 mg Oral Daily Gabriel Cirri F, NP   40 mg at 06/30/22 5465   propranolol (INDERAL) tablet 20 mg  20 mg Oral TID Vanetta Mulders, NP   20 mg at 06/30/22 1115   temazepam (RESTORIL) capsule 15 mg  15 mg Oral QHS PRN Vanetta Mulders, NP   15 mg at 06/29/22 2124    Lab Results:  Results for orders placed or performed during the hospital encounter of 06/27/22 (from the past 48 hour(s))  Resp panel by RT-PCR (RSV, Flu A&B, Covid) Anterior Nasal Swab     Status: None   Collection Time: 06/29/22 11:12 AM   Specimen: Anterior Nasal Swab  Result Value Ref Range   SARS Coronavirus 2 by RT PCR NEGATIVE NEGATIVE    Comment: (NOTE) SARS-CoV-2 target nucleic acids are NOT DETECTED.  The SARS-CoV-2 RNA is generally detectable in upper respiratory specimens during the acute phase of infection. The lowest concentration of SARS-CoV-2 viral copies this assay can detect is 138 copies/mL. A negative result does not preclude SARS-Cov-2 infection and should not be used as the sole basis for treatment or other patient management decisions. A negative result may occur with  improper specimen collection/handling, submission of specimen other than nasopharyngeal swab, presence of viral mutation(s) within the areas targeted by this assay, and inadequate number of viral copies(<138 copies/mL). A negative result must be combined with clinical observations, patient history, and epidemiological information. The expected result is Negative.  Fact Sheet for Patients:  BloggerCourse.com  Fact Sheet for Healthcare Providers:  SeriousBroker.it  This test is no t yet approved or cleared by the [place] FDA and  has been authorized for detection and/or diagnosis of SARS-CoV-2 by FDA under an Emergency Use Authorization (EUA). This EUA will remain  in effect (meaning this test can be used)  for the duration of the COVID-19 declaration under Section 564(b)(1) of the Act, 21 U.S.C.section 360bbb-3(b)(1), unless the authorization is terminated  or revoked sooner.       Influenza A by PCR NEGATIVE NEGATIVE   Influenza B by PCR NEGATIVE NEGATIVE    Comment: (NOTE) The Xpert Xpress SARS-CoV-2/FLU/RSV plus assay is intended as an aid in the diagnosis of influenza from Nasopharyngeal swab specimens and should not be used as a sole basis for treatment. Nasal washings and aspirates are unacceptable for  Xpert Xpress SARS-CoV-2/FLU/RSV testing.  Fact Sheet for Patients: BloggerCourse.com  Fact Sheet for Healthcare Providers: SeriousBroker.it  This test is not yet approved or cleared by the [place] FDA and has been authorized for detection and/or diagnosis of SARS-CoV-2 by FDA under an Emergency Use Authorization (EUA). This EUA will remain in effect (meaning this test can be used) for the duration of the COVID-19 declaration under Section 564(b)(1) of the Act, 21 U.S.C. section 360bbb-3(b)(1), unless the authorization is terminated or revoked.     Resp Syncytial Virus by PCR NEGATIVE NEGATIVE    Comment: (NOTE) Fact Sheet for Patients: BloggerCourse.com  Fact Sheet for Healthcare Providers: SeriousBroker.it  This test is not yet approved or cleared by the [place] FDA and has been authorized for detection and/or diagnosis of SARS-CoV-2 by FDA under an Emergency Use Authorization (EUA). This EUA will remain in effect (meaning this test can be used) for the duration of the COVID-19 declaration under Section 564(b)(1) of the Act, 21 U.S.C. section 360bbb-3(b)(1), unless the authorization is terminated or revoked.  Performed at [facility], [address] [place], [place] [address]     Blood Alcohol level:  Lab Results  Component Value Date    [facility] <10 06/27/2022   ETH <10 08/30/2021    Metabolic Disorder Labs: Lab Results  Component Value Date   HGBA1C 5.2 08/31/2021   MPG 103 08/31/2021   MPG 96.8 05/05/2021   No results found for: "PROLACTIN" Lab Results  Component Value Date   CHOL 207 (H) 08/31/2021   TRIG 265 (H) 08/31/2021   HDL 31 (L) 08/31/2021   CHOLHDL 6.7 08/31/2021   VLDL 53 (H) 08/31/2021   LDLCALC 123 (H) 08/31/2021   LDLCALC 150 (H) 05/05/2021    Physical Findings: AIMS: Facial and Oral Movements Muscles of Facial Expression: None, normal Lips and Perioral Area: None, normal Jaw: None, normal Tongue: None, normal,Extremity Movements Upper (arms, wrists, hands, fingers): None, normal Lower (legs, knees, ankles, toes): None, normal, Trunk Movements Neck, shoulders, hips: None, normal, Overall Severity Severity of abnormal movements (highest score from questions above): None, normal Incapacitation due to abnormal movements: None, normal Patient's awareness of abnormal movements (rate only patient's report): No Awareness, Dental Status Current problems with teeth and/or dentures?: No Does patient usually wear dentures?: No  CIWA:    COWS:     Musculoskeletal: Strength & Muscle Tone: within normal limits Gait & Station: normal Patient leans: N/A  Psychiatric Specialty Exam:  Presentation  General Appearance:  Appropriate for Environment  Eye Contact: Good  Speech: Clear and Coherent  Speech Volume: Normal  Handedness:No data recorded  Mood and Affect  Mood: Dysphoric  Affect: Congruent   Thought Process  Thought Processes: Disorganized  Descriptions of Associations:Loose  Orientation:Full (Time, Place and Person)  Thought Content:Paranoid Ideation; Delusions  History of Schizophrenia/Schizoaffective disorder:Yes  Duration of Psychotic Symptoms:Greater than six months  Hallucinations:No data recorded Ideas of Reference:Paranoia  Suicidal Thoughts:No data  recorded Homicidal Thoughts:No data recorded  Sensorium  Memory: Immediate Fair  Judgment: Impaired  Insight: Poor   Executive Functions  Concentration: Fair  Attention Span: Fair  Recall: [employer] of Knowledge: Fair  Language: Fair   Psychomotor Activity  Psychomotor Activity:No data recorded  Assets  Assets: Communication Skills; Desire for Improvement; Financial Resources/Insurance; Housing; Resilience   Sleep  Sleep:No data recorded   Physical Exam: Physical Exam Vitals and nursing note reviewed.  Constitutional:      Appearance: Normal appearance.  HENT:  Head: Normocephalic and atraumatic.     Mouth/Throat:     Pharynx: Oropharynx is clear.  Eyes:     Pupils: Pupils are equal, round, and reactive to light.  Cardiovascular:     Rate and Rhythm: Normal rate and regular rhythm.  Pulmonary:     Effort: Pulmonary effort is normal.     Breath sounds: Normal breath sounds.  Abdominal:     General: Abdomen is flat.     Palpations: Abdomen is soft.  Musculoskeletal:        General: Normal range of motion.  Skin:    General: Skin is warm and dry.  Neurological:     General: No focal deficit present.     Mental Status: He is alert. Mental status is at baseline.  Psychiatric:        Attention and Perception: He is inattentive.        Mood and Affect: Mood normal. Affect is blunt.        Speech: Speech is delayed.        Behavior: Behavior is withdrawn.        Thought Content: Thought content normal.        Cognition and Memory: Memory is impaired.    Review of Systems  Constitutional: Negative.   HENT: Negative.    Eyes: Negative.   Respiratory: Negative.    Cardiovascular: Negative.   Gastrointestinal: Negative.   Musculoskeletal: Negative.   Skin: Negative.   Neurological: Negative.   Psychiatric/Behavioral:  Positive for depression and hallucinations. The patient is nervous/anxious.    Blood pressure (!) 115/94, pulse 67,  temperature 97.7 F (36.5 C), temperature source Oral, resp. rate 18, height 6\' 1"  (1.854 m), weight 75.8 kg, SpO2 100 %. Body mass index is 22.03 kg/m.   Treatment Plan Summary: Medication management and Plan slightly improving gradually.  Check lithium level Monday.  No change to other medicine for today.  Encourage group attendance.  Nicholas RasmussenJohn Flor Houdeshell, MD 06/30/2022, 1:39 PM

## 2022-07-01 DIAGNOSIS — F200 Paranoid schizophrenia: Secondary | ICD-10-CM | POA: Diagnosis not present

## 2022-07-01 NOTE — Progress Notes (Signed)
[facility] MD Progress Note  07/01/2022 10:17 AM Nicholas Nielsen  MRN:  [id number] Subjective: Nicholas Nielsen was seen on rounds.  He is withdrawn to his room.  Been compliant with medications and no side effects.  No complaints.  No issues. Principal Problem: Psychosis (HCC) Diagnosis: Principal Problem:   Psychosis (HCC)  Total Time spent with patient: 15 minutes  Past Psychiatric History: History of schizophrenia  Past Medical History:  Past Medical History:  Diagnosis Date   Back pain    Bipolar 1 disorder (HCC)    Hepatitis C    Kidney stones    Schizo affective schizophrenia [facility])    Testicular cyst    History reviewed. No pertinent surgical history. Family History:  Family History  Problem Relation Age of Onset   Stroke Father    Hypertension Father    Family Psychiatric  History: Unremarkable Social History:  Social History   Substance and Sexual Activity  Alcohol Use Not Currently     Social History   Substance and Sexual Activity  Drug Use Yes   Frequency: 7.0 times per week   Types: Marijuana   Comment: Daily use; last use is 03/26/2019    Social History   Socioeconomic History   Marital status: Single    Spouse name: Not on file   Number of children: Not on file   Years of education: Not on file   Highest education level: Not on file  Occupational History   Occupation: Unemployed  Tobacco Use   Smoking status: Every Day    Packs/day: 1.00    Years: 15.00    Total pack years: 15.00    Types: Cigarettes   Smokeless tobacco: Never  Vaping Use   Vaping Use: Never used  Substance and Sexual Activity   Alcohol use: Not Currently   Drug use: Yes    Frequency: 7.0 times per week    Types: Marijuana    Comment: Daily use; last use is 03/26/2019   Sexual activity: Never    Birth control/protection: Condom  Other Topics Concern   Not on file  Social History Narrative   Pt stated that he is currently living with his brother.  He lives in [place] and is  unemployed.  Pt stated that he recently arranged an appointment with [facility].   Social Determinants of Health   Financial Resource Strain: Not on file  Food Insecurity: Food Insecurity Present (06/27/2022)   Hunger Vital Sign    Worried About Running Out of Food in the Last Year: Sometimes true    Ran Out of Food in the Last Year: Never true  Transportation Needs: Unmet Transportation Needs (06/27/2022)   PRAPARE - Administrator, Civil Service (Medical): Yes    Lack of Transportation (Non-Medical): Yes  Physical Activity: Not on file  Stress: Not on file  Social Connections: Not on file   Additional Social History:                         Sleep: Good  Appetite:  Good  Current Medications: Current Facility-Administered Medications  Medication Dose Route Frequency Provider Last Rate Last Admin   acetaminophen (TYLENOL) tablet 650 mg  650 mg Oral Q6H PRN Vanetta Mulders, NP       alum & mag hydroxide-simeth (MAALOX/MYLANTA) 200-200-20 MG/5ML suspension 30 mL  30 mL Oral Q4H PRN Gabriel Cirri F, NP       benztropine (COGENTIN) tablet 1 mg  1 mg Oral QHS Gabriel CirriBarthold, Louise F, NP   1 mg at 06/30/22 2131   hydrOXYzine (ATARAX) tablet 50 mg  50 mg Oral TID PRN Vanetta MuldersBarthold, Louise F, NP   50 mg at 06/30/22 0811   lithium carbonate (LITHOBID) ER tablet 600 mg  600 mg Oral Q12H Vanetta MuldersBarthold, Louise F, NP   600 mg at 07/01/22 [id number]   magnesium hydroxide (MILK OF MAGNESIA) suspension 30 mL  30 mL Oral Daily PRN Gabriel CirriBarthold, Louise F, NP       nicotine (NICODERM CQ - dosed in mg/24 hours) patch 14 mg  14 mg Transdermal Daily Gabriel CirriBarthold, Louise F, NP   14 mg at 07/01/22 0823   OLANZapine zydis (ZYPREXA) disintegrating tablet 10 mg  10 mg Oral TID PRN Reggie PileJoshi, Anand, MD   10 mg at 06/28/22 1500   Or   OLANZapine (ZYPREXA) injection 10 mg  10 mg Intramuscular TID PRN Reggie PileJoshi, Anand, MD       OLANZapine (ZYPREXA) tablet 30 mg  30 mg Oral QHS Gabriel CirriBarthold, Louise F, NP   30 mg at 06/30/22 2132    pantoprazole (PROTONIX) EC tablet 40 mg  40 mg Oral Daily Gabriel CirriBarthold, Louise F, NP   40 mg at 07/01/22 [id number]   propranolol (INDERAL) tablet 20 mg  20 mg Oral TID Vanetta MuldersBarthold, Louise F, NP   20 mg at 07/01/22 [id number]   temazepam (RESTORIL) capsule 15 mg  15 mg Oral QHS PRN Vanetta MuldersBarthold, Louise F, NP   15 mg at 06/29/22 2124    Lab Results:  Results for orders placed or performed during the hospital encounter of 06/27/22 (from the past 48 hour(s))  Resp panel by RT-PCR (RSV, Flu A&B, Covid) Anterior Nasal Swab     Status: None   Collection Time: 06/29/22 11:12 AM   Specimen: Anterior Nasal Swab  Result Value Ref Range   SARS Coronavirus 2 by RT PCR NEGATIVE NEGATIVE    Comment: (NOTE) SARS-CoV-2 target nucleic acids are NOT DETECTED.  The SARS-CoV-2 RNA is generally detectable in upper respiratory specimens during the acute phase of infection. The lowest concentration of SARS-CoV-2 viral copies this assay can detect is 138 copies/mL. A negative result does not preclude SARS-Cov-2 infection and should not be used as the sole basis for treatment or other patient management decisions. A negative result may occur with  improper specimen collection/handling, submission of specimen other than nasopharyngeal swab, presence of viral mutation(s) within the areas targeted by this assay, and inadequate number of viral copies(<138 copies/mL). A negative result must be combined with clinical observations, patient history, and epidemiological information. The expected result is Negative.  Fact Sheet for Patients:  BloggerCourse.comhttps://www.fda.gov/media/152166/download  Fact Sheet for Healthcare Providers:  SeriousBroker.ithttps://www.fda.gov/media/152162/download  This test is no t yet approved or cleared by the [place] FDA and  has been authorized for detection and/or diagnosis of SARS-CoV-2 by FDA under an Emergency Use Authorization (EUA). This EUA will remain  in effect (meaning this test can be used) for the duration of  the COVID-19 declaration under Section 564(b)(1) of the Act, 21 U.S.C.section 360bbb-3(b)(1), unless the authorization is terminated  or revoked sooner.       Influenza A by PCR NEGATIVE NEGATIVE   Influenza B by PCR NEGATIVE NEGATIVE    Comment: (NOTE) The Xpert Xpress SARS-CoV-2/FLU/RSV plus assay is intended as an aid in the diagnosis of influenza from Nasopharyngeal swab specimens and should not be used as a sole basis for treatment. Nasal washings and aspirates are unacceptable  for Xpert Xpress SARS-CoV-2/FLU/RSV testing.  Fact Sheet for Patients: BloggerCourse.com  Fact Sheet for Healthcare Providers: SeriousBroker.it  This test is not yet approved or cleared by the [place] FDA and has been authorized for detection and/or diagnosis of SARS-CoV-2 by FDA under an Emergency Use Authorization (EUA). This EUA will remain in effect (meaning this test can be used) for the duration of the COVID-19 declaration under Section 564(b)(1) of the Act, 21 U.S.C. section 360bbb-3(b)(1), unless the authorization is terminated or revoked.     Resp Syncytial Virus by PCR NEGATIVE NEGATIVE    Comment: (NOTE) Fact Sheet for Patients: BloggerCourse.com  Fact Sheet for Healthcare Providers: SeriousBroker.it  This test is not yet approved or cleared by the [place] FDA and has been authorized for detection and/or diagnosis of SARS-CoV-2 by FDA under an Emergency Use Authorization (EUA). This EUA will remain in effect (meaning this test can be used) for the duration of the COVID-19 declaration under Section 564(b)(1) of the Act, 21 U.S.C. section 360bbb-3(b)(1), unless the authorization is terminated or revoked.  Performed at [facility], [address] [place], [place] [address]     Blood Alcohol level:  Lab Results  Component Value Date   [facility] <10 06/27/2022    ETH <10 08/30/2021    Metabolic Disorder Labs: Lab Results  Component Value Date   HGBA1C 5.2 08/31/2021   MPG 103 08/31/2021   MPG 96.8 05/05/2021   No results found for: "PROLACTIN" Lab Results  Component Value Date   CHOL 207 (H) 08/31/2021   TRIG 265 (H) 08/31/2021   HDL 31 (L) 08/31/2021   CHOLHDL 6.7 08/31/2021   VLDL 53 (H) 08/31/2021   LDLCALC 123 (H) 08/31/2021   LDLCALC 150 (H) 05/05/2021    Physical Findings: AIMS: Facial and Oral Movements Muscles of Facial Expression: None, normal Lips and Perioral Area: None, normal Jaw: None, normal Tongue: None, normal,Extremity Movements Upper (arms, wrists, hands, fingers): None, normal Lower (legs, knees, ankles, toes): None, normal, Trunk Movements Neck, shoulders, hips: None, normal, Overall Severity Severity of abnormal movements (highest score from questions above): None, normal Incapacitation due to abnormal movements: None, normal Patient's awareness of abnormal movements (rate only patient's report): No Awareness, Dental Status Current problems with teeth and/or dentures?: No Does patient usually wear dentures?: No  CIWA:    COWS:     Musculoskeletal: Strength & Muscle Tone: within normal limits Gait & Station: normal Patient leans: N/A  Psychiatric Specialty Exam:  Presentation  General Appearance:  Appropriate for Environment  Eye Contact: Good  Speech: Clear and Coherent  Speech Volume: Normal  Handedness:No data recorded  Mood and Affect  Mood: Dysphoric  Affect: Congruent   Thought Process  Thought Processes: Disorganized  Descriptions of Associations:Loose  Orientation:Full (Time, Place and Person)  Thought Content:Paranoid Ideation; Delusions  History of Schizophrenia/Schizoaffective disorder:Yes  Duration of Psychotic Symptoms:Greater than six months  Hallucinations:No data recorded Ideas of Reference:Paranoia  Suicidal Thoughts:No data recorded Homicidal  Thoughts:No data recorded  Sensorium  Memory: Immediate Fair  Judgment: Impaired  Insight: Poor   Executive Functions  Concentration: Fair  Attention Span: Fair  Recall: [employer] of Knowledge: Fair  Language: Fair   Psychomotor Activity  Psychomotor Activity:No data recorded  Assets  Assets: Communication Skills; Desire for Improvement; Financial Resources/Insurance; Housing; Resilience   Sleep  Sleep:No data recorded   Physical Exam: Physical Exam: Mood and affect stable ROS: Negative Blood pressure (!) 111/91, pulse 76, temperature 98.2 F (36.8  C), temperature source Oral, resp. rate 18, height 6\' 1"  (1.854 m), weight 75.8 kg, SpO2 100 %. Body mass index is 22.03 kg/m.   Treatment Plan Summary: Daily contact with patient to assess and evaluate symptoms and progress in treatment, Medication management, and Plan continue current medications.  , DO 07/01/2022, 10:17 AM

## 2022-07-01 NOTE — Plan of Care (Signed)
D: Patient alert and oriented. Patient denies pain. Patient denies anxiety and depression. Patient denies SI/HI/AVH. Patient has been isolative to room during shift with the exception to coming out for meals and scheduled medication.  A: Scheduled medications administered to patient, per MD orders.  Support and encouragement provided to patient.  Q15 minute safety checks maintained.   R: Patient compliant with medication administration and treatment plan. No adverse drug reactions noted. Patient remains safe on the unit at this time. Problem: Education: Goal: Knowledge of [facility] Education information/materials will improve Outcome: Progressing Goal: Verbalization of understanding the information provided will improve Outcome: Progressing   Problem: Health Behavior/Discharge Planning: Goal: Compliance with treatment plan for underlying cause of condition will improve Outcome: Progressing   Problem: Education: Goal: Knowledge of the prescribed therapeutic regimen will improve Outcome: Progressing

## 2022-07-01 NOTE — Progress Notes (Signed)
No distress noted interacting appropriately with peers and staff, denies SI/HI/AVH, thoughts are disorganized and incoherent at times denies SI/HI/AVH will continue to monitor.

## 2022-07-02 DIAGNOSIS — F200 Paranoid schizophrenia: Secondary | ICD-10-CM | POA: Diagnosis not present

## 2022-07-02 NOTE — Progress Notes (Signed)
[facility] MD Progress Note  07/02/2022 10:38 AM Nicholas Nielsen  MRN:  [id number] Subjective: Nicholas Nielsen was seen on rounds.  He has been in his room a lot.  He has been compliant with medications.  He denies any side effects.  He does not have any complaints. Principal Problem: Psychosis (HCC) Diagnosis: Principal Problem:   Psychosis (HCC)  Total Time spent with patient: 15 minutes  Past Psychiatric History: History of schizophrenia  Past Medical History:  Past Medical History:  Diagnosis Date   Back pain    Bipolar 1 disorder (HCC)    Hepatitis C    Kidney stones    Schizo affective schizophrenia [facility])    Testicular cyst    History reviewed. No pertinent surgical history. Family History:  Family History  Problem Relation Age of Onset   Stroke Father    Hypertension Father    Family Psychiatric  History: Unremarkable Social History:  Social History   Substance and Sexual Activity  Alcohol Use Not Currently     Social History   Substance and Sexual Activity  Drug Use Yes   Frequency: 7.0 times per week   Types: Marijuana   Comment: Daily use; last use is 03/26/2019    Social History   Socioeconomic History   Marital status: Single    Spouse name: Not on file   Number of children: Not on file   Years of education: Not on file   Highest education level: Not on file  Occupational History   Occupation: Unemployed  Tobacco Use   Smoking status: Every Day    Packs/day: 1.00    Years: 15.00    Total pack years: 15.00    Types: Cigarettes   Smokeless tobacco: Never  Vaping Use   Vaping Use: Never used  Substance and Sexual Activity   Alcohol use: Not Currently   Drug use: Yes    Frequency: 7.0 times per week    Types: Marijuana    Comment: Daily use; last use is 03/26/2019   Sexual activity: Never    Birth control/protection: Condom  Other Topics Concern   Not on file  Social History Narrative   Pt stated that he is currently living with his brother.  He lives  in [place] and is unemployed.  Pt stated that he recently arranged an appointment with [facility].   Social Determinants of Health   Financial Resource Strain: Not on file  Food Insecurity: Food Insecurity Present (06/27/2022)   Hunger Vital Sign    Worried About Running Out of Food in the Last Year: Sometimes true    Ran Out of Food in the Last Year: Never true  Transportation Needs: Unmet Transportation Needs (06/27/2022)   PRAPARE - Administrator, Civil Service (Medical): Yes    Lack of Transportation (Non-Medical): Yes  Physical Activity: Not on file  Stress: Not on file  Social Connections: Not on file   Additional Social History:                         Sleep: Good  Appetite:  Good  Current Medications: Current Facility-Administered Medications  Medication Dose Route Frequency Provider Last Rate Last Admin   acetaminophen (TYLENOL) tablet 650 mg  650 mg Oral Q6H PRN Vanetta Mulders, NP       alum & mag hydroxide-simeth (MAALOX/MYLANTA) 200-200-20 MG/5ML suspension 30 mL  30 mL Oral Q4H PRN Vanetta Mulders, NP  benztropine (COGENTIN) tablet 1 mg  1 mg Oral QHS Gabriel Cirri F, NP   1 mg at 07/01/22 2129   hydrOXYzine (ATARAX) tablet 50 mg  50 mg Oral TID PRN Vanetta Mulders, NP   50 mg at 06/30/22 0811   lithium carbonate (LITHOBID) ER tablet 600 mg  600 mg Oral Q12H Gabriel Cirri F, NP   600 mg at 07/02/22 3428   magnesium hydroxide (MILK OF MAGNESIA) suspension 30 mL  30 mL Oral Daily PRN Gabriel Cirri F, NP       nicotine (NICODERM CQ - dosed in mg/24 hours) patch 14 mg  14 mg Transdermal Daily Gabriel Cirri F, NP   14 mg at 07/02/22 7681   OLANZapine zydis (ZYPREXA) disintegrating tablet 10 mg  10 mg Oral TID PRN Reggie Pile, MD   10 mg at 06/28/22 1500   Or   OLANZapine (ZYPREXA) injection 10 mg  10 mg Intramuscular TID PRN Reggie Pile, MD       OLANZapine (ZYPREXA) tablet 30 mg  30 mg Oral QHS Gabriel Cirri F, NP    30 mg at 07/01/22 2129   pantoprazole (PROTONIX) EC tablet 40 mg  40 mg Oral Daily Gabriel Cirri F, NP   40 mg at 07/02/22 1572   propranolol (INDERAL) tablet 20 mg  20 mg Oral TID Vanetta Mulders, NP   20 mg at 07/02/22 6203   temazepam (RESTORIL) capsule 15 mg  15 mg Oral QHS PRN Vanetta Mulders, NP   15 mg at 06/29/22 2124    Lab Results: No results found for this or any previous visit (from the past 48 hour(s)).  Blood Alcohol level:  Lab Results  Component Value Date   ETH <10 06/27/2022   ETH <10 08/30/2021    Metabolic Disorder Labs: Lab Results  Component Value Date   HGBA1C 5.2 08/31/2021   MPG 103 08/31/2021   MPG 96.8 05/05/2021   No results found for: "PROLACTIN" Lab Results  Component Value Date   CHOL 207 (H) 08/31/2021   TRIG 265 (H) 08/31/2021   HDL 31 (L) 08/31/2021   CHOLHDL 6.7 08/31/2021   VLDL 53 (H) 08/31/2021   LDLCALC 123 (H) 08/31/2021   LDLCALC 150 (H) 05/05/2021    Physical Findings: AIMS: Facial and Oral Movements Muscles of Facial Expression: None, normal Lips and Perioral Area: None, normal Jaw: None, normal Tongue: None, normal,Extremity Movements Upper (arms, wrists, hands, fingers): None, normal Lower (legs, knees, ankles, toes): None, normal, Trunk Movements Neck, shoulders, hips: None, normal, Overall Severity Severity of abnormal movements (highest score from questions above): None, normal Incapacitation due to abnormal movements: None, normal Patient's awareness of abnormal movements (rate only patient's report): No Awareness, Dental Status Current problems with teeth and/or dentures?: No Does patient usually wear dentures?: No  CIWA:    COWS:     Musculoskeletal: Strength & Muscle Tone: within normal limits Gait & Station: normal Patient leans: N/A  Psychiatric Specialty Exam:  Presentation  General Appearance:  Appropriate for Environment  Eye Contact: Good  Speech: Clear and Coherent  Speech  Volume: Normal  Handedness:No data recorded  Mood and Affect  Mood: Dysphoric  Affect: Congruent   Thought Process  Thought Processes: Disorganized  Descriptions of Associations:Loose  Orientation:Full (Time, Place and Person)  Thought Content:Paranoid Ideation; Delusions  History of Schizophrenia/Schizoaffective disorder:Yes  Duration of Psychotic Symptoms:Greater than six months  Hallucinations:No data recorded Ideas of Reference:Paranoia  Suicidal Thoughts:No data recorded Homicidal Thoughts:No data  recorded  Sensorium  Memory: Immediate Fair  Judgment: Impaired  Insight: Poor   Executive Functions  Concentration: Fair  Attention Span: Fair  Recall: [employer] of Knowledge: Fair  Language: Fair   Psychomotor Activity  Psychomotor Activity:No data recorded  Assets  Assets: Communication Skills; Desire for Improvement; Financial Resources/Insurance; Housing; Resilience   Sleep  Sleep:No data recorded   Physical Exam: Physical Exam negative ROS negative Blood pressure (!) 119/91, pulse 75, temperature 98.2 F (36.8 C), temperature source Oral, resp. rate 18, height 6\' 1"  (1.854 m), weight 75.8 kg, SpO2 100 %. Body mass index is 22.03 kg/m.   Treatment Plan Summary: Daily contact with patient to assess and evaluate symptoms and progress in treatment, Medication management, and Plan continue current medications.  , DO 07/02/2022, 10:38 AM

## 2022-07-02 NOTE — Progress Notes (Signed)
Patient is alert and oriented x 4, his thoughts are organized and coherent denies SI/HI/AVH interacting appropriately with peers and staff. Patient compliant with medication regimen denies pain or dis comfort will continue to monitor.

## 2022-07-02 NOTE — Plan of Care (Signed)
D: Patient alert and oriented. Patient denies pain. Patient denies anxiety and depression. Patient denies SI/HI/AVH. Patient endorsed anxiety after lunch and requested some medication for anxiety. PRN hydroxyzine was given to patient. When anxiety was reassessed patient states that the medication was effective. Patient remains isolative to room during shift with exception to coming out for meals and medication.  A: Scheduled medications administered to patient, per MD orders.  Support and encouragement provided to patient.  Q15 minute safety checks maintained.   R: Patient compliant with medication administration and treatment plan. No adverse drug reactions noted. Patient remains safe on the unit at this time. Problem: Education: Goal: Knowledge of [place] [facility] Education information/materials will improve Outcome: Progressing Goal: Verbalization of understanding the information provided will improve Outcome: Progressing   Problem: Health Behavior/Discharge Planning: Goal: Compliance with treatment plan for underlying cause of condition will improve Outcome: Progressing   Problem: Education: Goal: Knowledge of the prescribed therapeutic regimen will improve Outcome: Progressing   Problem: Safety: Goal: Ability to remain free from injury will improve Outcome: Progressing

## 2022-07-02 NOTE — [facility] Group Notes (Signed)
[facility] Group Notes:  (Nursing/MHT/Case Management/Adjunct)  Date:  07/02/2022  Time:  8:43 PM  Type of Therapy:   Wrap up  Participation Level:  Did Not Attend   Summary of Progress/Problems:  Nicholas Nielsen 07/02/2022, 8:43 PM

## 2022-07-03 DIAGNOSIS — F200 Paranoid schizophrenia: Secondary | ICD-10-CM | POA: Diagnosis not present

## 2022-07-03 LAB — LITHIUM LEVEL: Lithium Lvl: 1.24 mmol/L — ABNORMAL HIGH (ref 0.60–1.20)

## 2022-07-03 MED ORDER — LITHIUM CARBONATE ER 450 MG PO TBCR: 450.0000 mg | EXTENDED_RELEASE_TABLET | Freq: Two times a day (BID) | ORAL | Status: DC

## 2022-07-03 MED ORDER — LITHIUM CARBONATE ER 450 MG PO TBCR
450.0000 mg | EXTENDED_RELEASE_TABLET | Freq: Two times a day (BID) | ORAL | Status: DC
  Administered 2022-07-04 – 2022-07-06 (×4): 450 mg via ORAL
  Filled 2022-07-03 (×4): qty 1

## 2022-07-03 NOTE — BH IP Treatment Plan (Signed)
Interdisciplinary Treatment and Diagnostic Plan Update  07/03/2022 Time of Session: 0830 Nicholas Nielsen MRN: [id number]  Principal Diagnosis: Psychosis [facility])  Secondary Diagnoses: Principal Problem:   Psychosis ([place])   Current Medications:  Current Facility-Administered Medications  Medication Dose Route Frequency Provider Last Rate Last Admin   acetaminophen (TYLENOL) tablet 650 mg  650 mg Oral Q6H PRN Sherlon Handing, NP       alum & mag hydroxide-simeth (MAALOX/MYLANTA) 200-200-20 MG/5ML suspension 30 mL  30 mL Oral Q4H PRN Sherlon Handing, NP       benztropine (COGENTIN) tablet 1 mg  1 mg Oral QHS Waldon Merl F, NP   1 mg at 07/02/22 2115   hydrOXYzine (ATARAX) tablet 50 mg  50 mg Oral TID PRN Sherlon Handing, NP   50 mg at 07/02/22 1214   lithium carbonate (LITHOBID) ER tablet 600 mg  600 mg Oral Q12H Waldon Merl F, NP   600 mg at 07/03/22 6967   magnesium hydroxide (MILK OF MAGNESIA) suspension 30 mL  30 mL Oral Daily PRN Waldon Merl F, NP       nicotine (NICODERM CQ - dosed in mg/24 hours) patch 14 mg  14 mg Transdermal Daily Waldon Merl F, NP   14 mg at 07/03/22 0823   OLANZapine zydis (ZYPREXA) disintegrating tablet 10 mg  10 mg Oral TID PRN Rulon Sera, MD   10 mg at 06/28/22 1500   Or   OLANZapine (ZYPREXA) injection 10 mg  10 mg Intramuscular TID PRN Rulon Sera, MD       OLANZapine (ZYPREXA) tablet 30 mg  30 mg Oral QHS Waldon Merl F, NP   30 mg at 07/02/22 2114   pantoprazole (PROTONIX) EC tablet 40 mg  40 mg Oral Daily Waldon Merl F, NP   40 mg at 07/03/22 8938   propranolol (INDERAL) tablet 20 mg  20 mg Oral TID Sherlon Handing, NP   20 mg at 07/03/22 1017   temazepam (RESTORIL) capsule 15 mg  15 mg Oral QHS PRN Sherlon Handing, NP   15 mg at 07/02/22 2115   PTA Medications: Medications Prior to Admission  Medication Sig Dispense Refill Last Dose   amLODipine (NORVASC) 5 MG tablet Take 1 tablet (5 mg total) by mouth 2  (two) times daily. (Patient not taking: Reported on 06/27/2022) 20 tablet 0    benztropine (COGENTIN) 1 MG tablet Take 1 tablet (1 mg total) by mouth at bedtime. (Patient not taking: Reported on 06/27/2022) 10 tablet 0    benztropine (COGENTIN) 2 MG tablet Take 2 mg by mouth at bedtime.      docusate sodium (COLACE) 100 MG capsule Take 1 capsule (100 mg total) by mouth 2 (two) times daily. 20 capsule 0    gabapentin (NEURONTIN) 800 MG tablet Take 800 mg by mouth at bedtime.      hydrOXYzine (ATARAX) 50 MG tablet Take 1 tablet (50 mg total) by mouth 3 (three) times daily as needed for anxiety. 20 tablet 0    lithium carbonate (ESKALITH) 450 MG ER tablet Take 900 mg by mouth at bedtime.      lithium carbonate (LITHOBID) 300 MG CR tablet Take 2 tablets (600 mg total) by mouth every 12 (twelve) hours. (Patient not taking: Reported on 06/27/2022) 40 tablet 0    naltrexone (DEPADE) 50 MG tablet Take 50 mg by mouth at bedtime.      nicotine (NICODERM CQ - DOSED IN MG/24 HOURS) 14 mg/24hr patch  Place 1 patch (14 mg total) onto the skin daily. 14 patch 0    OLANZapine (ZYPREXA) 15 MG tablet Take 2 tablets (30 mg total) by mouth at bedtime. (Patient not taking: Reported on 06/27/2022) 20 tablet 0    OLANZapine (ZYPREXA) 20 MG tablet Take 20 mg by mouth daily.      pantoprazole (PROTONIX) 40 MG tablet Take 1 tablet (40 mg total) by mouth daily. (Patient not taking: Reported on 06/27/2022) 10 tablet 0    propranolol (INDERAL) 20 MG tablet Take 1 tablet (20 mg total) by mouth 3 (three) times daily. 30 tablet 0    QUEtiapine (SEROQUEL) 100 MG tablet Take 300 mg by mouth at bedtime.      rosuvastatin (CRESTOR) 20 MG tablet Take 20 mg by mouth daily.      temazepam (RESTORIL) 15 MG capsule Take 1 capsule (15 mg total) by mouth at bedtime as needed for sleep. 30 capsule 1     Patient Stressors: Legal issue   Marital or family conflict   Medication change or noncompliance    Patient Strengths: Average or above  average intelligence  Communication skills  Physical Health  Supportive family/friends   Treatment Modalities: Medication Management, Group therapy, Case management,  1 to 1 session with clinician, Psychoeducation, Recreational therapy.   Physician Treatment Plan for Primary Diagnosis: Psychosis ([place]) Long Term Goal(s): Improvement in symptoms so as ready for discharge   Short Term Goals: Ability to verbalize feelings will improve Ability to disclose and discuss suicidal ideas Ability to identify changes in lifestyle to reduce recurrence of condition will improve  Medication Management: Evaluate patient's response, side effects, and tolerance of medication regimen.  Therapeutic Interventions: 1 to 1 sessions, Unit Group sessions and Medication administration.  Evaluation of Outcomes: Progressing  Physician Treatment Plan for Secondary Diagnosis: Principal Problem:   Psychosis ([place])  Long Term Goal(s): Improvement in symptoms so as ready for discharge   Short Term Goals: Ability to verbalize feelings will improve Ability to disclose and discuss suicidal ideas Ability to identify changes in lifestyle to reduce recurrence of condition will improve     Medication Management: Evaluate patient's response, side effects, and tolerance of medication regimen.  Therapeutic Interventions: 1 to 1 sessions, Unit Group sessions and Medication administration.  Evaluation of Outcomes: Progressing   RN Treatment Plan for Primary Diagnosis: Psychosis ([place]) Long Term Goal(s): Knowledge of disease and therapeutic regimen to maintain health will improve  Short Term Goals: Ability to remain free from injury will improve, Ability to verbalize frustration and anger appropriately will improve, Ability to demonstrate self-control, Ability to participate in decision making will improve, Ability to verbalize feelings will improve, Ability to disclose and discuss suicidal ideas, Ability to identify and  develop effective coping behaviors will improve, and Compliance with prescribed medications will improve  Medication Management: RN will administer medications as ordered by provider, will assess and evaluate patient's response and provide education to patient for prescribed medication. RN will report any adverse and/or side effects to prescribing provider.  Therapeutic Interventions: 1 on 1 counseling sessions, Psychoeducation, Medication administration, Evaluate responses to treatment, Monitor vital signs and CBGs as ordered, Perform/monitor CIWA, COWS, AIMS and Fall Risk screenings as ordered, Perform wound care treatments as ordered.  Evaluation of Outcomes: Progressing   LCSW Treatment Plan for Primary Diagnosis: Psychosis ([place]) Long Term Goal(s): Safe transition to appropriate next level of care at discharge, Engage patient in therapeutic group addressing interpersonal concerns.  Short Term Goals: Engage patient  in aftercare planning with referrals and resources, Increase social support, Increase ability to appropriately verbalize feelings, Increase emotional regulation, Facilitate acceptance of mental health diagnosis and concerns, Facilitate patient progression through stages of change regarding substance use diagnoses and concerns, Identify triggers associated with mental health/substance abuse issues, and Increase skills for wellness and recovery  Therapeutic Interventions: Assess for all discharge needs, 1 to 1 time with Social worker, Explore available resources and support systems, Assess for adequacy in community support network, Educate family and significant other(s) on suicide prevention, Complete Psychosocial Assessment, Interpersonal group therapy.  Evaluation of Outcomes: Progressing   Progress in Treatment: Attending groups: No. Participating in groups: No. Taking medication as prescribed: Yes. Toleration medication: Yes. Family/Significant other contact made: No, will  contact:  Patient declined consent for CSW to reach family/friend.  Patient understands diagnosis: Yes. Discussing patient identified problems/goals with staff: Yes. Medical problems stabilized or resolved: Yes. Denies suicidal/homicidal ideation: No. Issues/concerns per patient self-inventory: Yes. Other: none  New problem(s) identified: No, Describe:  none  New Short Term/Long Term Goal(s): Patient to work towards detox, elimination of symptoms of psychosis, medication management for mood stabilization; elimination of SI thoughts; development of comprehensive mental wellness/sobriety plan.  Patient Goals:  No additional goals identified at this time. Patient to continue to work towards original goals identified in initial treatment team meeting. CSW will remain available to patient should they voice additional treatment goals.   Discharge Plan or Barriers: No psychosocial barriers identified at this time, patient to return to place of residence when appropriate for discharge.   Reason for Continuation of Hospitalization: Depression Medication stabilization  Estimated Length of Stay: 1-7 days    Scribe for Treatment Team: Shalev, [place] 07/03/2022 11:33 AM

## 2022-07-03 NOTE — Progress Notes (Signed)
[facility] MD Progress Note  07/03/2022 12:19 PM Nicholas Nielsen  MRN:  [id number] Subjective: Patient seen and chart reviewed.  Patient with schizophrenia or schizoaffective disorder.  Today he was able to talk a little more than he had the last couple times I saw him.  Described how sitting at home at his mother's house he kept believing that people were taking out legal charges against him.  Patient appears to be still disorganized in his thinking and somewhat paranoid.  Also has a notable tremor.  Lithium level checked this morning came back elevated at 1.24. Principal Problem: Psychosis ([place]) Diagnosis: Principal Problem:   Psychosis ([place])  Total Time spent with patient: 30 minutes  Past Psychiatric History: Past history of psychotic disorder and mood instability  Past Medical History:  Past Medical History:  Diagnosis Date   Back pain    Bipolar 1 disorder ([place])    Hepatitis C    Kidney stones    Schizo affective schizophrenia [facility])    Testicular cyst    History reviewed. No pertinent surgical history. Family History:  Family History  Problem Relation Age of Onset   Stroke Father    Hypertension Father    Family Psychiatric  History: See previous Social History:  Social History   Substance and Sexual Activity  Alcohol Use Not Currently     Social History   Substance and Sexual Activity  Drug Use Yes   Frequency: 7.0 times per week   Types: Marijuana   Comment: Daily use; last use is 03/26/2019    Social History   Socioeconomic History   Marital status: Single    Spouse name: Not on file   Number of children: Not on file   Years of education: Not on file   Highest education level: Not on file  Occupational History   Occupation: Unemployed  Tobacco Use   Smoking status: Every Day    Packs/day: 1.00    Years: 15.00    Total pack years: 15.00    Types: Cigarettes   Smokeless tobacco: Never  Vaping Use   Vaping Use: Never used  Substance and Sexual Activity    Alcohol use: Not Currently   Drug use: Yes    Frequency: 7.0 times per week    Types: Marijuana    Comment: Daily use; last use is 03/26/2019   Sexual activity: Never    Birth control/protection: Condom  Other Topics Concern   Not on file  Social History Narrative   Pt stated that he is currently living with his brother.  He lives in [place] and is unemployed.  Pt stated that he recently arranged an appointment with [facility].   Social Determinants of Health   Financial Resource Strain: Not on file  Food Insecurity: Food Insecurity Present (06/27/2022)   Hunger Vital Sign    Worried About Running Out of Food in the Last Year: Sometimes true    Ran Out of Food in the Last Year: Never true  Transportation Needs: Unmet Transportation Needs (06/27/2022)   PRAPARE - Hydrologist (Medical): Yes    Lack of Transportation (Non-Medical): Yes  Physical Activity: Not on file  Stress: Not on file  Social Connections: Not on file   Additional Social History:                         Sleep: Fair  Appetite:  Fair  Current Medications: Current Facility-Administered Medications  Medication  Dose Route Frequency Provider Last Rate Last Admin   acetaminophen (TYLENOL) tablet 650 mg  650 mg Oral Q6H PRN Sherlon Handing, NP       alum & mag hydroxide-simeth (MAALOX/MYLANTA) 200-200-20 MG/5ML suspension 30 mL  30 mL Oral Q4H PRN Sherlon Handing, NP       benztropine (COGENTIN) tablet 1 mg  1 mg Oral QHS Waldon Merl F, NP   1 mg at 07/02/22 2115   hydrOXYzine (ATARAX) tablet 50 mg  50 mg Oral TID PRN Sherlon Handing, NP   50 mg at 07/02/22 1214   [START ON 07/04/2022] lithium carbonate (ESKALITH) ER tablet 450 mg  450 mg Oral Q12H Laythan Hayter, Madie Reno, MD       magnesium hydroxide (MILK OF MAGNESIA) suspension 30 mL  30 mL Oral Daily PRN Sherlon Handing, NP       nicotine (NICODERM CQ - dosed in mg/24 hours) patch 14 mg  14 mg Transdermal Daily  Waldon Merl F, NP   14 mg at 07/03/22 0823   OLANZapine zydis (ZYPREXA) disintegrating tablet 10 mg  10 mg Oral TID PRN Rulon Sera, MD   10 mg at 06/28/22 1500   Or   OLANZapine (ZYPREXA) injection 10 mg  10 mg Intramuscular TID PRN Rulon Sera, MD       OLANZapine (ZYPREXA) tablet 30 mg  30 mg Oral QHS Waldon Merl F, NP   30 mg at 07/02/22 2114   pantoprazole (PROTONIX) EC tablet 40 mg  40 mg Oral Daily Waldon Merl F, NP   40 mg at 07/03/22 6712   propranolol (INDERAL) tablet 20 mg  20 mg Oral TID Sherlon Handing, NP   20 mg at 07/03/22 4580   temazepam (RESTORIL) capsule 15 mg  15 mg Oral QHS PRN Sherlon Handing, NP   15 mg at 07/02/22 2115    Lab Results:  Results for orders placed or performed during the hospital encounter of 06/27/22 (from the past 48 hour(s))  Lithium level     Status: Abnormal   Collection Time: 07/03/22  6:46 AM  Result Value Ref Range   Lithium Lvl 1.24 (H) 0.60 - 1.20 mmol/L    Comment: Performed at [facility], [address] [place], [place] [address]    Blood Alcohol level:  Lab Results  Component Value Date   [facility] <10 06/27/2022   ETH <10 82/50/5397    Metabolic Disorder Labs: Lab Results  Component Value Date   HGBA1C 5.2 08/31/2021   MPG 103 08/31/2021   MPG 96.8 05/05/2021   No results found for: "PROLACTIN" Lab Results  Component Value Date   CHOL 207 (H) 08/31/2021   TRIG 265 (H) 08/31/2021   HDL 31 (L) 08/31/2021   CHOLHDL 6.7 08/31/2021   VLDL 53 (H) 08/31/2021   LDLCALC 123 (H) 08/31/2021   LDLCALC 150 (H) 05/05/2021    Physical Findings: AIMS: Facial and Oral Movements Muscles of Facial Expression: None, normal Lips and Perioral Area: None, normal Jaw: None, normal Tongue: None, normal,Extremity Movements Upper (arms, wrists, hands, fingers): None, normal Lower (legs, knees, ankles, toes): None, normal, Trunk Movements Neck, shoulders, hips: None, normal, Overall Severity Severity of  abnormal movements (highest score from questions above): None, normal Incapacitation due to abnormal movements: None, normal Patient's awareness of abnormal movements (rate only patient's report): No Awareness, Dental Status Current problems with teeth and/or dentures?: No Does patient usually wear dentures?: No  CIWA:  COWS:     Musculoskeletal: Strength & Muscle Tone: within normal limits Gait & Station: normal Patient leans: N/A  Psychiatric Specialty Exam:  Presentation  General Appearance:  Appropriate for Environment  Eye Contact: Good  Speech: Clear and Coherent  Speech Volume: Normal  Handedness:No data recorded  Mood and Affect  Mood: Dysphoric  Affect: Congruent   Thought Process  Thought Processes: Disorganized  Descriptions of Associations:Loose  Orientation:Full (Time, Place and Person)  Thought Content:Paranoid Ideation; Delusions  History of Schizophrenia/Schizoaffective disorder:Yes  Duration of Psychotic Symptoms:Greater than six months  Hallucinations:No data recorded Ideas of Reference:Paranoia  Suicidal Thoughts:No data recorded Homicidal Thoughts:No data recorded  Sensorium  Memory: Immediate Fair  Judgment: Impaired  Insight: Poor   Executive Functions  Concentration: Fair  Attention Span: Fair  Recall: [employer] of Knowledge: Fair  Language: Fair   Psychomotor Activity  Psychomotor Activity:No data recorded  Assets  Assets: Communication Skills; Desire for Improvement; Financial Resources/Insurance; Housing; Resilience   Sleep  Sleep:No data recorded   Physical Exam: Physical Exam Vitals and nursing note reviewed.  Constitutional:      Appearance: Normal appearance.  HENT:     Head: Normocephalic and atraumatic.     Mouth/Throat:     Pharynx: Oropharynx is clear.  Eyes:     Pupils: Pupils are equal, round, and reactive to light.  Cardiovascular:     Rate and Rhythm: Normal rate and  regular rhythm.  Pulmonary:     Effort: Pulmonary effort is normal.     Breath sounds: Normal breath sounds.  Abdominal:     General: Abdomen is flat.     Palpations: Abdomen is soft.  Musculoskeletal:        General: Normal range of motion.  Skin:    General: Skin is warm and dry.  Neurological:     General: No focal deficit present.     Mental Status: He is alert. Mental status is at baseline.  Psychiatric:        Attention and Perception: He is inattentive.        Mood and Affect: Affect is blunt.        Speech: Speech is tangential.        Behavior: Behavior is cooperative.        Thought Content: Thought content is paranoid.        Cognition and Memory: Memory is impaired.    Review of Systems  Constitutional: Negative.   HENT: Negative.    Eyes: Negative.   Respiratory: Negative.    Cardiovascular: Negative.   Gastrointestinal: Negative.   Musculoskeletal: Negative.   Skin: Negative.   Neurological: Negative.   Psychiatric/Behavioral:  The patient is nervous/anxious.    Blood pressure 111/87, pulse 69, temperature 98.5 F (36.9 C), temperature source Oral, resp. rate 18, height 6\' 1"  (1.854 m), weight 75.8 kg, SpO2 100 %. Body mass index is 22.03 kg/m.   Treatment Plan Summary: Medication management and Plan mentally seems to be doing better although progress is slow.  This is typical of what we have seen in the past.  His lithium level is too high and is probably causing his acute tremor.  I am going to hold lithium tonight and restart it tomorrow at 450 mg twice a day.  Continue olanzapine.  Encourage patient to communicate about his symptoms and come out of his room as much as possible.  Alethia Berthold, MD 07/03/2022, 12:19 PM

## 2022-07-03 NOTE — Progress Notes (Signed)
   07/03/22 2200  Psych Admission Type (Psych Patients Only)  Admission Status Involuntary  Psychosocial Assessment  Patient Complaints Anxiety  Eye Contact Fair  Facial Expression Flat  Affect Flat  Speech Logical/coherent  Interaction Assertive  Motor Activity Slow  Appearance/Hygiene Improved  Behavior Characteristics Cooperative;Appropriate to situation;Calm  Mood Preoccupied  Thought Process  Coherency WDL  Content WDL  Delusions None reported or observed  Perception WDL  Hallucination None reported or observed  Judgment Impaired  Confusion None  Danger to Self  Current suicidal ideation? Denies  Danger to Others  Danger to Others None reported or observed

## 2022-07-03 NOTE — Plan of Care (Signed)
D: Patient alert and oriented. Patient denies pain. Patient denies anxiety and depression. Patient denies SI/HI/AVH. Patient endorse 4/10 anxiety after lunch and requested PRN hydroxyzine. Medication provided to patient. Patient states that the medication was effective.  Patient remains isolative to room during shift with exception to coming out for meals and medication.  A: Scheduled medications administered to patient, per MD orders.  Support and encouragement provided to patient.  Q15 minute safety checks maintained.   R: Patient compliant with medication administration and treatment plan. No adverse drug reactions noted. Patient remains safe on the unit at this time. Problem: Education: Goal: Knowledge of  General Education information/materials will improve Outcome: Progressing Goal: Verbalization of understanding the information provided will improve Outcome: Progressing   Problem: Health Behavior/Discharge Planning: Goal: Compliance with treatment plan for underlying cause of condition will improve Outcome: Progressing   Problem: Safety: Goal: Ability to remain free from injury will improve Outcome: Progressing

## 2022-07-04 DIAGNOSIS — F200 Paranoid schizophrenia: Secondary | ICD-10-CM | POA: Diagnosis not present

## 2022-07-04 NOTE — Group Note (Signed)
[facility] LCSW Group Therapy Note    Group Date: 07/04/2022 Start Time: 1300 End Time: 1400  Type of Therapy and Topic:  Group Therapy:  Overcoming Obstacles  Participation Level:  [facility] PARTICIPATION LEVEL: Did Not Attend  Mood:  Description of Group:   In this group patients will be encouraged to explore what they see as obstacles to their own wellness and recovery. They will be guided to discuss their thoughts, feelings, and behaviors related to these obstacles. The group will process together ways to cope with barriers, with attention given to specific choices patients can make. Each patient will be challenged to identify changes they are motivated to make in order to overcome their obstacles. This group will be process-oriented, with patients participating in exploration of their own experiences as well as giving and receiving support and challenge from other group members.  Therapeutic Goals: 1. Patient will identify personal and current obstacles as they relate to admission. 2. Patient will identify barriers that currently interfere with their wellness or overcoming obstacles.  3. Patient will identify feelings, thought process and behaviors related to these barriers. 4. Patient will identify two changes they are willing to make to overcome these obstacles:    Summary of Patient Progress Patient did not attend group, though encouraged to do so by this clinician.    Therapeutic Modalities:   Cognitive Behavioral Therapy Solution Focused Therapy Motivational Interviewing Relapse Prevention Therapy   Reginald Weida J Fartun Paradiso, LCSW 

## 2022-07-04 NOTE — Progress Notes (Signed)
[facility] MD Progress Note  07/04/2022 4:27 PM Nicholas Nielsen  MRN:  [id number] Subjective: Follow-up [place]e 42 year old man with schizophrenia.  Patient reports he is feeling better today.  He still stays isolated but that has been typical in all of his hospital stays.  He says the hallucinations are mostly gone now.  His mood is somewhat less anxious although he still gets nervous being around people.  Denies any suicidal thought. Principal Problem: Psychosis ([place]) Diagnosis: Principal Problem:   Psychosis ([place])  Total Time spent with patient: 30 minutes  Past Psychiatric History: Past history of recurrent psychotic episode appears to have a schizophrenic illness  Past Medical History:  Past Medical History:  Diagnosis Date   Back pain    Bipolar 1 disorder ([place])    Hepatitis C    Kidney stones    Schizo affective schizophrenia [facility])    Testicular cyst    History reviewed. No pertinent surgical history. Family History:  Family History  Problem Relation Age of Onset   Stroke Father    Hypertension Father    Family Psychiatric  History: See previous Social History:  Social History   Substance and Sexual Activity  Alcohol Use Not Currently     Social History   Substance and Sexual Activity  Drug Use Yes   Frequency: 7.0 times per week   Types: Marijuana   Comment: Daily use; last use is 03/26/2019    Social History   Socioeconomic History   Marital status: Single    Spouse name: Not on file   Number of children: Not on file   Years of education: Not on file   Highest education level: Not on file  Occupational History   Occupation: Unemployed  Tobacco Use   Smoking status: Every Day    Packs/day: 1.00    Years: 15.00    Total pack years: 15.00    Types: Cigarettes   Smokeless tobacco: Never  Vaping Use   Vaping Use: Never used  Substance and Sexual Activity   Alcohol use: Not Currently   Drug use: Yes    Frequency: 7.0 times per week    Types: Marijuana     Comment: Daily use; last use is 03/26/2019   Sexual activity: Never    Birth control/protection: Condom  Other Topics Concern   Not on file  Social History Narrative   Pt stated that he is currently living with his brother.  He lives in [place] and is unemployed.  Pt stated that he recently arranged an appointment with [facility].   Social Determinants of Health   Financial Resource Strain: Not on file  Food Insecurity: Food Insecurity Present (06/27/2022)   Hunger Vital Sign    Worried About Running Out of Food in the Last Year: Sometimes true    Ran Out of Food in the Last Year: Never true  Transportation Needs: Unmet Transportation Needs (06/27/2022)   PRAPARE - Hydrologist (Medical): Yes    Lack of Transportation (Non-Medical): Yes  Physical Activity: Not on file  Stress: Not on file  Social Connections: Not on file   Additional Social History:                         Sleep: Fair  Appetite:  Fair  Current Medications: Current Facility-Administered Medications  Medication Dose Route Frequency Provider Last Rate Last Admin   acetaminophen (TYLENOL) tablet 650 mg  650 mg Oral Q6H PRN  Sherlon Handing, NP       alum & mag hydroxide-simeth (MAALOX/MYLANTA) 200-200-20 MG/5ML suspension 30 mL  30 mL Oral Q4H PRN Sherlon Handing, NP       benztropine (COGENTIN) tablet 1 mg  1 mg Oral QHS Sherlon Handing, NP   1 mg at 07/03/22 2123   hydrOXYzine (ATARAX) tablet 50 mg  50 mg Oral TID PRN Sherlon Handing, NP   50 mg at 07/04/22 1205   lithium carbonate (ESKALITH) ER tablet 450 mg  450 mg Oral Q12H Chole Driver, Madie Reno, MD       magnesium hydroxide (MILK OF MAGNESIA) suspension 30 mL  30 mL Oral Daily PRN Sherlon Handing, NP       nicotine (NICODERM CQ - dosed in mg/24 hours) patch 14 mg  14 mg Transdermal Daily Waldon Merl F, NP   14 mg at 07/04/22 0755   OLANZapine zydis (ZYPREXA) disintegrating tablet 10 mg  10 mg Oral TID PRN  Rulon Sera, MD   10 mg at 06/28/22 1500   Or   OLANZapine (ZYPREXA) injection 10 mg  10 mg Intramuscular TID PRN Rulon Sera, MD       OLANZapine (ZYPREXA) tablet 30 mg  30 mg Oral QHS Waldon Merl F, NP   30 mg at 07/03/22 2139   pantoprazole (PROTONIX) EC tablet 40 mg  40 mg Oral Daily Sherlon Handing, NP   40 mg at 07/04/22 0754   propranolol (INDERAL) tablet 20 mg  20 mg Oral TID Sherlon Handing, NP   20 mg at 07/04/22 1205   temazepam (RESTORIL) capsule 15 mg  15 mg Oral QHS PRN Sherlon Handing, NP   15 mg at 07/02/22 2115    Lab Results:  Results for orders placed or performed during the hospital encounter of 06/27/22 (from the past 48 hour(s))  Lithium level     Status: Abnormal   Collection Time: 07/03/22  6:46 AM  Result Value Ref Range   Lithium Lvl 1.24 (H) 0.60 - 1.20 mmol/L    Comment: Performed at [facility], [address] [place], [place] [address]    Blood Alcohol level:  Lab Results  Component Value Date   [facility] <10 06/27/2022   ETH <10 93/81/8299    Metabolic Disorder Labs: Lab Results  Component Value Date   HGBA1C 5.2 08/31/2021   MPG 103 08/31/2021   MPG 96.8 05/05/2021   No results found for: "PROLACTIN" Lab Results  Component Value Date   CHOL 207 (H) 08/31/2021   TRIG 265 (H) 08/31/2021   HDL 31 (L) 08/31/2021   CHOLHDL 6.7 08/31/2021   VLDL 53 (H) 08/31/2021   LDLCALC 123 (H) 08/31/2021   LDLCALC 150 (H) 05/05/2021    Physical Findings: AIMS: Facial and Oral Movements Muscles of Facial Expression: None, normal Lips and Perioral Area: None, normal Jaw: None, normal Tongue: None, normal,Extremity Movements Upper (arms, wrists, hands, fingers): None, normal Lower (legs, knees, ankles, toes): None, normal, Trunk Movements Neck, shoulders, hips: None, normal, Overall Severity Severity of abnormal movements (highest score from questions above): None, normal Incapacitation due to abnormal movements: None,  normal Patient's awareness of abnormal movements (rate only patient's report): No Awareness, Dental Status Current problems with teeth and/or dentures?: No Does patient usually wear dentures?: No  CIWA:    COWS:     Musculoskeletal: Strength & Muscle Tone: within normal limits Gait & Station: normal Patient leans: N/A  Psychiatric Specialty Exam:  Presentation  General Appearance:  Appropriate for Environment  Eye Contact: Good  Speech: Clear and Coherent  Speech Volume: Normal  Handedness:No data recorded  Mood and Affect  Mood: Dysphoric  Affect: Congruent   Thought Process  Thought Processes: Disorganized  Descriptions of Associations:Loose  Orientation:Full (Time, Place and Person)  Thought Content:Paranoid Ideation; Delusions  History of Schizophrenia/Schizoaffective disorder:Yes  Duration of Psychotic Symptoms:Greater than six months  Hallucinations:No data recorded Ideas of Reference:Paranoia  Suicidal Thoughts:No data recorded Homicidal Thoughts:No data recorded  Sensorium  Memory: Immediate Fair  Judgment: Impaired  Insight: Poor   Executive Functions  Concentration: Fair  Attention Span: Fair  Recall: [employer] of Knowledge: Fair  Language: Fair   Psychomotor Activity  Psychomotor Activity:No data recorded  Assets  Assets: Communication Skills; Desire for Improvement; Financial Resources/Insurance; Housing; Resilience   Sleep  Sleep:No data recorded   Physical Exam: Physical Exam Vitals and nursing note reviewed.  Constitutional:      Appearance: Normal appearance.  HENT:     Head: Normocephalic and atraumatic.     Mouth/Throat:     Pharynx: Oropharynx is clear.  Eyes:     Pupils: Pupils are equal, round, and reactive to light.  Cardiovascular:     Rate and Rhythm: Normal rate and regular rhythm.  Pulmonary:     Effort: Pulmonary effort is normal.     Breath sounds: Normal breath sounds.   Abdominal:     General: Abdomen is flat.     Palpations: Abdomen is soft.  Musculoskeletal:        General: Normal range of motion.  Skin:    General: Skin is warm and dry.  Neurological:     General: No focal deficit present.     Mental Status: He is alert. Mental status is at baseline.  Psychiatric:        Attention and Perception: Attention normal.        Mood and Affect: Mood is anxious. Affect is blunt.        Speech: Speech is delayed.        Behavior: Behavior is slowed.        Thought Content: Thought content normal.    Review of Systems  Constitutional: Negative.   HENT: Negative.    Eyes: Negative.   Respiratory: Negative.    Cardiovascular: Negative.   Gastrointestinal: Negative.   Musculoskeletal: Negative.   Skin: Negative.   Neurological: Negative.   Psychiatric/Behavioral:  Negative for hallucinations and suicidal ideas. The patient is nervous/anxious.    Blood pressure 125/77, pulse 71, temperature 98.3 F (36.8 C), temperature source Oral, resp. rate 20, height 6\' 1"  (1.854 m), weight 75.8 kg, SpO2 99 %. Body mass index is 22.03 kg/m.   Treatment Plan Summary: Medication management and Plan patient appears to be doing better.  I think we can start planning on possible discharge in 1 to 2 days.  No change to medicine for now.  Alethia Berthold, MD 07/04/2022, 4:27 PM

## 2022-07-04 NOTE — [facility] Group Notes (Signed)
[place] Group Notes:  (Nursing/MHT/Case Management/Adjunct)  Date:  07/04/2022  Time:  9:20 AM  Type of Therapy:   Community Meeting  Participation Level:  Did Not Attend   Adela Lank [facility] 07/04/2022, 9:20 AM

## 2022-07-04 NOTE — Plan of Care (Signed)
Patient stated that he is feeling good and his goal for today is to go home. Patient continues to isolates in his room. Calm and cooperative on approach. Denies SI,HI and AVH. Appetite and energy level good. Compliant with medications. Support and encouragement given.

## 2022-07-04 NOTE — Progress Notes (Signed)
Recreation Therapy Notes    Date: 07/04/2022  Time: 10:00 am  Location: Craft room     Behavioral response: N/A   Intervention Topic: Teamwork    Discussion/Intervention: Patient refused to attend group.   Clinical Observations/Feedback:  Patient refused to attend group.    Nicholas Nielsen LRT/CTRS        Nicholas Nielsen 07/04/2022 10:48 AM 

## 2022-07-05 ENCOUNTER — Other Ambulatory Visit: Payer: Self-pay

## 2022-07-05 DIAGNOSIS — F200 Paranoid schizophrenia: Secondary | ICD-10-CM | POA: Diagnosis not present

## 2022-07-05 MED ORDER — BENZTROPINE MESYLATE 1 MG PO TABS: 1.0000 mg | ORAL_TABLET | Freq: Every day | ORAL | 1 refills | Status: DC

## 2022-07-05 MED ORDER — PROPRANOLOL HCL 20 MG PO TABS: 20.0000 mg | ORAL_TABLET | Freq: Three times a day (TID) | ORAL | 1 refills | Status: DC

## 2022-07-05 MED ORDER — NICOTINE 14 MG/24HR TD PT24
14.0000 mg | MEDICATED_PATCH | Freq: Every day | TRANSDERMAL | 0 refills | Status: AC
  Filled 2022-07-05: qty 28, 28d supply, fill #0

## 2022-07-05 MED ORDER — LITHIUM CARBONATE ER 450 MG PO TBCR: 450.0000 mg | EXTENDED_RELEASE_TABLET | Freq: Two times a day (BID) | ORAL | 1 refills | Status: DC

## 2022-07-05 MED ORDER — HYDROXYZINE HCL 50 MG PO TABS: 50.0000 mg | ORAL_TABLET | Freq: Three times a day (TID) | ORAL | 1 refills | Status: DC | PRN

## 2022-07-05 MED ORDER — NICOTINE 14 MG/24HR TD PT24: 14.0000 mg | MEDICATED_PATCH | Freq: Every day | TRANSDERMAL | 1 refills | Status: DC

## 2022-07-05 MED ORDER — PANTOPRAZOLE SODIUM 40 MG PO TBEC
40.0000 mg | DELAYED_RELEASE_TABLET | Freq: Every day | ORAL | 0 refills | Status: AC
  Filled 2022-07-05: qty 30, 30d supply, fill #0

## 2022-07-05 MED ORDER — TEMAZEPAM 15 MG PO CAPS: 15.0000 mg | ORAL_CAPSULE | Freq: Every evening | ORAL | 1 refills | Status: AC | PRN

## 2022-07-05 MED ORDER — LITHIUM CARBONATE ER 450 MG PO TBCR
450.0000 mg | EXTENDED_RELEASE_TABLET | Freq: Two times a day (BID) | ORAL | 0 refills | Status: AC
  Filled 2022-07-05: qty 60, 30d supply, fill #0

## 2022-07-05 MED ORDER — BENZTROPINE MESYLATE 1 MG PO TABS
1.0000 mg | ORAL_TABLET | Freq: Every day | ORAL | 0 refills | Status: AC
  Filled 2022-07-05: qty 30, 30d supply, fill #0

## 2022-07-05 MED ORDER — PROPRANOLOL HCL 20 MG PO TABS
20.0000 mg | ORAL_TABLET | Freq: Three times a day (TID) | ORAL | 0 refills | Status: AC
  Filled 2022-07-05: qty 60, 20d supply, fill #0
  Filled 2022-07-06: qty 30, 10d supply, fill #0

## 2022-07-05 MED ORDER — PANTOPRAZOLE SODIUM 40 MG PO TBEC: 40.0000 mg | DELAYED_RELEASE_TABLET | Freq: Every day | ORAL | 1 refills | Status: DC

## 2022-07-05 MED ORDER — OLANZAPINE 15 MG PO TABS: 30.0000 mg | ORAL_TABLET | Freq: Every day | ORAL | 1 refills | Status: DC

## 2022-07-05 MED ORDER — HYDROXYZINE HCL 50 MG PO TABS
50.0000 mg | ORAL_TABLET | Freq: Three times a day (TID) | ORAL | 0 refills | Status: AC | PRN
  Filled 2022-07-05: qty 60, 20d supply, fill #0

## 2022-07-05 MED ORDER — OLANZAPINE 15 MG PO TABS
30.0000 mg | ORAL_TABLET | Freq: Every day | ORAL | 0 refills | Status: AC
  Filled 2022-07-05: qty 60, 30d supply, fill #0

## 2022-07-05 NOTE — Plan of Care (Signed)
  Problem: Education: Goal: Knowledge of [facility] Education information/materials will improve Outcome: Progressing Goal: Emotional status will improve Outcome: Progressing Goal: Mental status will improve Outcome: Progressing Goal: Verbalization of understanding the information provided will improve Outcome: Progressing   Problem: Health Behavior/Discharge Planning: Goal: Identification of resources available to assist in meeting health care needs will improve Outcome: Progressing Goal: Compliance with treatment plan for underlying cause of condition will improve Outcome: Progressing   Problem: Activity: Goal: Will verbalize the importance of balancing activity with adequate rest periods Outcome: Progressing   Problem: Education: Goal: Will be free of psychotic symptoms Outcome: Progressing Goal: Knowledge of the prescribed therapeutic regimen will improve Outcome: Progressing   Problem: Coping: Goal: Coping ability will improve Outcome: Progressing Goal: Will verbalize feelings Outcome: Progressing   Problem: Safety: Goal: Ability to redirect hostility and anger into socially appropriate behaviors will improve Outcome: Progressing Goal: Ability to remain free from injury will improve Outcome: Progressing   Problem: Self-Concept: Goal: Will verbalize positive feelings about self Outcome: Progressing

## 2022-07-05 NOTE — Plan of Care (Signed)
  Problem: Education: Goal: Knowledge of [facility] Education information/materials will improve Outcome: Progressing Goal: Emotional status will improve Outcome: Progressing Goal: Mental status will improve Outcome: Progressing Goal: Verbalization of understanding the information provided will improve Outcome: Progressing   Problem: Health Behavior/Discharge Planning: Goal: Identification of resources available to assist in meeting health care needs will improve Outcome: Progressing Goal: Compliance with treatment plan for underlying cause of condition will improve Outcome: Progressing   Problem: Self-Concept: Goal: Will verbalize positive feelings about self Outcome: Progressing   Problem: Safety: Goal: Ability to redirect hostility and anger into socially appropriate behaviors will improve Outcome: Progressing Goal: Ability to remain free from injury will improve Outcome: Progressing   Problem: Coping: Goal: Coping ability will improve Outcome: Progressing Goal: Will verbalize feelings Outcome: Progressing

## 2022-07-05 NOTE — Progress Notes (Signed)
   07/05/22 0000  Psych Admission Type (Psych Patients Only)  Admission Status Involuntary  Psychosocial Assessment  Patient Complaints None  Eye Contact Fair  Facial Expression Flat  Affect Appropriate to circumstance  Speech Logical/coherent  Interaction Assertive  Motor Activity Slow  Appearance/Hygiene Unremarkable  Behavior Characteristics Cooperative;Appropriate to situation  Mood Preoccupied  Thought Process  Coherency WDL  Content WDL  Delusions None reported or observed  Perception WDL  Hallucination None reported or observed  Judgment Impaired  Confusion None  Danger to Self  Current suicidal ideation? Denies  Danger to Others  Danger to Others None reported or observed

## 2022-07-05 NOTE — [facility] Group Notes (Signed)
[place] Group Notes:  (Nursing/MHT/Case Management/Adjunct)  Date:  07/05/2022  Time:  11:00 AM  Type of Therapy:   Community Meeting  Participation Level:  Did Not Attend   Nicholas Nielsen Nicholas Nielsen 07/05/2022, 11:00 AM

## 2022-07-05 NOTE — Progress Notes (Signed)
[facility] MD Progress Note  07/05/2022 11:16 AM Nicholas Nielsen  MRN:  [id number] Subjective: Follow-up 42 year old man with psychotic disorder.  Spoke to the patient today and also spoke with his mother.  I told the patient that I was thinking of discharging him tomorrow because that would give [place] a little more time for him to be stable and make sure he has a medicine supply and he is tolerating the lithium.  Patient told me he wanted to be discharged today because it was his son's birthday.  He then went on to talk about how he needed to go to various police stations and give them his fingerprints.  I spoke with his mother who confirmed for me that it is not his son's birthday also that she had not spoken to the patient since he had been here even though the patient claimed he had.  Finally that the ideas about the police have been pretty chronic for years.  Patient is not aggressive or threatening or suicidal.  Mother had reported that prior to coming to the hospital he was very agitated at home. Principal Problem: Psychosis ([place]) Diagnosis: Principal Problem:   Psychosis ([place])  Total Time spent with patient: 30 minutes  Past Psychiatric History: Past history of psychotic disorder for at least the last couple years which has a very strong flavor of schizophrenia  Past Medical History:  Past Medical History:  Diagnosis Date   Back pain    Bipolar 1 disorder ([place])    Hepatitis C    Kidney stones    Schizo affective schizophrenia ([place])    Testicular cyst    History reviewed. No pertinent surgical history. Family History:  Family History  Problem Relation Age of Onset   Stroke Father    Hypertension Father    Family Psychiatric  History: See previous Social History:  Social History   Substance and Sexual Activity  Alcohol Use Not Currently     Social History   Substance and Sexual Activity  Drug Use Yes   Frequency: 7.0 times per week   Types: Marijuana   Comment: Daily use; last  use is 03/26/2019    Social History   Socioeconomic History   Marital status: Single    Spouse name: Not on file   Number of children: Not on file   Years of education: Not on file   Highest education level: Not on file  Occupational History   Occupation: Unemployed  Tobacco Use   Smoking status: Every Day    Packs/day: 1.00    Years: 15.00    Total pack years: 15.00    Types: Cigarettes   Smokeless tobacco: Never  Vaping Use   Vaping Use: Never used  Substance and Sexual Activity   Alcohol use: Not Currently   Drug use: Yes    Frequency: 7.0 times per week    Types: Marijuana    Comment: Daily use; last use is 03/26/2019   Sexual activity: Never    Birth control/protection: Condom  Other Topics Concern   Not on file  Social History Narrative   Pt stated that he is currently living with his brother.  He lives in [place] and is unemployed.  Pt stated that he recently arranged an appointment with [facility].   Social Determinants of Health   Financial Resource Strain: Not on file  Food Insecurity: Food Insecurity Present (06/27/2022)   Hunger Vital Sign    Worried About Running Out of Food in the Last Year:  Sometimes true    Ran Out of Food in the Last Year: Never true  Transportation Needs: Unmet Transportation Needs (06/27/2022)   PRAPARE - Hydrologist (Medical): Yes    Lack of Transportation (Non-Medical): Yes  Physical Activity: Not on file  Stress: Not on file  Social Connections: Not on file   Additional Social History:                         Sleep: Fair  Appetite:  Fair  Current Medications: Current Facility-Administered Medications  Medication Dose Route Frequency Provider Last Rate Last Admin   acetaminophen (TYLENOL) tablet 650 mg  650 mg Oral Q6H PRN Sherlon Handing, NP       alum & mag hydroxide-simeth (MAALOX/MYLANTA) 200-200-20 MG/5ML suspension 30 mL  30 mL Oral Q4H PRN Waldon Merl F, NP        benztropine (COGENTIN) tablet 1 mg  1 mg Oral QHS Waldon Merl F, NP   1 mg at 07/04/22 2103   hydrOXYzine (ATARAX) tablet 50 mg  50 mg Oral TID PRN Sherlon Handing, NP   50 mg at 07/04/22 1205   lithium carbonate (ESKALITH) ER tablet 450 mg  450 mg Oral Q12H Ahlijah Raia T, MD   450 mg at 07/05/22 [id number]   magnesium hydroxide (MILK OF MAGNESIA) suspension 30 mL  30 mL Oral Daily PRN Waldon Merl F, NP       nicotine (NICODERM CQ - dosed in mg/24 hours) patch 14 mg  14 mg Transdermal Daily Waldon Merl F, NP   14 mg at 07/05/22 0805   OLANZapine zydis (ZYPREXA) disintegrating tablet 10 mg  10 mg Oral TID PRN Rulon Sera, MD   10 mg at 06/28/22 1500   Or   OLANZapine (ZYPREXA) injection 10 mg  10 mg Intramuscular TID PRN Rulon Sera, MD       OLANZapine (ZYPREXA) tablet 30 mg  30 mg Oral QHS Waldon Merl F, NP   30 mg at 07/04/22 2103   pantoprazole (PROTONIX) EC tablet 40 mg  40 mg Oral Daily Waldon Merl F, NP   40 mg at 07/05/22 [id number]   propranolol (INDERAL) tablet 20 mg  20 mg Oral TID Sherlon Handing, NP   20 mg at 07/05/22 0806   temazepam (RESTORIL) capsule 15 mg  15 mg Oral QHS PRN Sherlon Handing, NP   15 mg at 07/02/22 2115    Lab Results: No results found for this or any previous visit (from the past 44 hour(s)).  Blood Alcohol level:  Lab Results  Component Value Date   ETH <10 06/27/2022   ETH <10 123XX123    Metabolic Disorder Labs: Lab Results  Component Value Date   HGBA1C 5.2 08/31/2021   MPG 103 08/31/2021   MPG 96.8 05/05/2021   No results found for: "PROLACTIN" Lab Results  Component Value Date   CHOL 207 (H) 08/31/2021   TRIG 265 (H) 08/31/2021   HDL 31 (L) 08/31/2021   CHOLHDL 6.7 08/31/2021   VLDL 53 (H) 08/31/2021   LDLCALC 123 (H) 08/31/2021   LDLCALC 150 (H) 05/05/2021    Physical Findings: AIMS: Facial and Oral Movements Muscles of Facial Expression: None, normal Lips and Perioral Area: None, normal Jaw: None,  normal Tongue: None, normal,Extremity Movements Upper (arms, wrists, hands, fingers): None, normal Lower (legs, knees, ankles, toes): None, normal, Trunk Movements Neck, shoulders, hips: None, normal, Overall Severity  Severity of abnormal movements (highest score from questions above): None, normal Incapacitation due to abnormal movements: None, normal Patient's awareness of abnormal movements (rate only patient's report): No Awareness, Dental Status Current problems with teeth and/or dentures?: No Does patient usually wear dentures?: No  CIWA:    COWS:     Musculoskeletal: Strength & Muscle Tone: within normal limits Gait & Station: normal Patient leans: N/A  Psychiatric Specialty Exam:  Presentation  General Appearance:  Appropriate for Environment  Eye Contact: Good  Speech: Clear and Coherent  Speech Volume: Normal  Handedness:No data recorded  Mood and Affect  Mood: Dysphoric  Affect: Congruent   Thought Process  Thought Processes: Disorganized  Descriptions of Associations:Loose  Orientation:Full (Time, Place and Person)  Thought Content:Paranoid Ideation; Delusions  History of Schizophrenia/Schizoaffective disorder:Yes  Duration of Psychotic Symptoms:Greater than six months  Hallucinations:No data recorded Ideas of Reference:Paranoia  Suicidal Thoughts:No data recorded Homicidal Thoughts:No data recorded  Sensorium  Memory: Immediate Fair  Judgment: Impaired  Insight: Poor   Executive Functions  Concentration: Fair  Attention Span: Fair  Recall: [employer] of Knowledge: Fair  Language: Fair   Psychomotor Activity  Psychomotor Activity:No data recorded  Assets  Assets: Communication Skills; Desire for Improvement; Financial Resources/Insurance; Housing; Resilience   Sleep  Sleep:No data recorded   Physical Exam: Physical Exam Vitals and nursing note reviewed.  Constitutional:      Appearance: Normal  appearance.  HENT:     Head: Normocephalic and atraumatic.     Mouth/Throat:     Pharynx: Oropharynx is clear.  Eyes:     Pupils: Pupils are equal, round, and reactive to light.  Cardiovascular:     Rate and Rhythm: Normal rate and regular rhythm.  Pulmonary:     Effort: Pulmonary effort is normal.     Breath sounds: Normal breath sounds.  Abdominal:     General: Abdomen is flat.     Palpations: Abdomen is soft.  Musculoskeletal:        General: Normal range of motion.  Skin:    General: Skin is warm and dry.  Neurological:     General: No focal deficit present.     Mental Status: He is alert. Mental status is at baseline.  Psychiatric:        Attention and Perception: Attention normal. He is attentive.        Mood and Affect: Mood normal. Affect is blunt.        Speech: Speech is delayed and tangential.        Behavior: Behavior is slowed.        Thought Content: Thought content is paranoid.        Cognition and Memory: Cognition is impaired.    Review of Systems  Constitutional: Negative.   HENT: Negative.    Eyes: Negative.   Respiratory: Negative.    Cardiovascular: Negative.   Gastrointestinal: Negative.   Musculoskeletal: Negative.   Skin: Negative.   Neurological: Negative.   Psychiatric/Behavioral:  The patient is nervous/anxious.    Blood pressure 116/82, pulse 63, temperature 98.2 F (36.8 C), temperature source Oral, resp. rate 20, height 6\' 1"  (1.854 m), weight 75.8 kg, SpO2 97 %. Body mass index is 22.03 kg/m.   Treatment Plan Summary: Medication management and Plan back on lithium at lower doses.  Tremor less obvious.  Patient has no physical complaints.  I think spoken to his mother and determine that there is nothing special about today that would push for  earlier admission and we are going to wait and probably plan on discharge tomorrow when he will have a full supply.  Mother advised.  [facility] Pickup will continue to be follow-up.  Alethia Berthold,  MD 07/05/2022, 11:16 AM

## 2022-07-05 NOTE — Progress Notes (Signed)
D- Patient alert and oriented x 3-4. Affect flat/mood anxious. Denies SI/HI/AVH. He denies pain. States "I think I am going home today". Plans are to discharge to home tomorrow. A- Scheduled medications administered to patient, per MD orders. Monitoring for medication (Lithium) change and shakiness. Support and encouragement provided.  Routine safety checks conducted every 15 minutes without incident. Patient informed to notify staff with problems or concerns. R- No adverse drug reactions noted. Noted trembling of hands. Patient compliant with medications and treatment plan. Patient receptive, calm, and cooperative. He spends most of his time in his room.  Patient contracts for safety and remains safe at this time on the unit.

## 2022-07-05 NOTE — Group Note (Signed)
[facility] LCSW Group Therapy Note   Group Date: 07/05/2022 Start Time: 1300 End Time: 1400   Type of Therapy/Topic:  Group Therapy:  Emotion Regulation  Participation Level:  Did Not Attend    Description of Group:    The purpose of this group is to assist patients in learning to regulate negative emotions and experience positive emotions. Patients will be guided to discuss ways in which they have been vulnerable to their negative emotions. These vulnerabilities will be juxtaposed with experiences of positive emotions or situations, and patients challenged to use positive emotions to combat negative ones. Special emphasis will be placed on coping with negative emotions in conflict situations, and patients will process healthy conflict resolution skills.  Therapeutic Goals: Patient will identify two positive emotions or experiences to reflect on in order to balance out negative emotions:  Patient will label two or more emotions that they find the most difficult to experience:  Patient will be able to demonstrate positive conflict resolution skills through discussion or role plays:   Summary of Patient Progress: X   Therapeutic Modalities:   Cognitive Behavioral Therapy Feelings Identification Dialectical Behavioral Therapy   Mort Smelser R Davison Ohms, LCSW 

## 2022-07-06 ENCOUNTER — Other Ambulatory Visit: Payer: Self-pay

## 2022-07-06 DIAGNOSIS — F200 Paranoid schizophrenia: Secondary | ICD-10-CM | POA: Diagnosis not present

## 2022-07-06 NOTE — Progress Notes (Signed)
Quiet and reserved he remains isolative to his room.  He does come out for snacks and meals.  He denies si  hi  avh depression and anxiety. Although he denies he is easily heard talking to himself in his room. He is med compliant and had no issues or concerns that needed to be addressed. Will continue to monitor.  Encouraged him to seek staff with any concerns that he may have. Q 15 minute safety checks in place.  C Heritage-N[place]olson, LPN

## 2022-07-06 NOTE — Group Note (Signed)
LCSW Group Therapy Note  Group Date: 07/06/2022 Start Time: 1300 End Time: 1400   Type of Therapy and Topic:  Group Therapy: Positive Affirmations  Participation Level:  Did Not Attend   Description of Group:   This group addressed positive affirmation towards self and others.  Patients went around the room and identified two positive things about themselves and two positive things about a peer in the room.  Patients reflected on how it felt to share something positive with others, to identify positive things about themselves, and to hear positive things from others/ Patients were encouraged to have a daily reflection of positive characteristics or circumstances.   Therapeutic Goals: Patients will verbalize two of their positive qualities Patients will demonstrate empathy for others by stating two positive qualities about a peer in the group Patients will verbalize their feelings when voicing positive self affirmations and when voicing positive affirmations of others Patients will discuss the potential positive impact on their wellness/recovery of focusing on positive traits of self and others.  Summary of Patient Progress:   Patient declined to attend group, despite encouragement from this CSW.  Therapeutic Modalities:   Cognitive Behavioral Therapy Motivational Interviewing    BRALIN GARRY, [place] 07/06/2022  1:50 PM

## 2022-07-06 NOTE — Progress Notes (Signed)
Discharge Note:  Patient denies SI/HI/AVH at this time. Discharge instructions, AVS, prescriptions, and transition record gone over with patient. Patient agrees to comply with medication management, follow-up visit with MD, and ACT Team. Patient belongings and money returned to patient. Patient questions and concerns addressed and answered. Patient ambulatory off unit. Patient discharged to with ACT Team member [place].

## 2022-07-06 NOTE — Progress Notes (Signed)
Recreation Therapy Notes  INPATIENT RECREATION TR PLAN  Patient Details Name: Nicholas Nielsen MRN: [id number] DOB: [birth year] Today's Date: 07/06/2022  Rec Therapy Plan Is patient appropriate for Therapeutic Recreation?: Yes Treatment times per week: at least 3 Estimated Length of Stay: 5-7 days TR Treatment/Interventions: Group participation (Comment)  Discharge Criteria Pt will be discharged from therapy if:: Discharged Treatment plan/goals/alternatives discussed and agreed upon by:: Patient/family  Discharge Summary     [name] 07/06/2022, 2:51 PM

## 2022-07-06 NOTE — Progress Notes (Signed)
Recreation Therapy Notes    Date: 07/06/2022  Time: 10:05 am  Location: Craft room     Behavioral response: N/A   Intervention Topic: Time Management    Discussion/Intervention: Patient refused to attend group.   Clinical Observations/Feedback:  Patient refused to attend group.    Charan Prieto LRT/CTRS        Corry Storie 07/06/2022 11:38 AM

## 2022-07-06 NOTE — Progress Notes (Signed)
Recreation Therapy Notes  INPATIENT RECREATION TR PLAN  Patient Details Name: Nicholas Nielsen MRN: [id number] DOB: 1980/12/06 Today's Date: 07/06/2022  Rec Therapy Plan Is patient appropriate for Therapeutic Recreation?: Yes Treatment times per week: at least 3 Estimated Length of Stay: 5-7 days TR Treatment/Interventions: Group participation (Comment)  Discharge Criteria Pt will be discharged from therapy if:: Discharged Treatment plan/goals/alternatives discussed and agreed upon by:: Patient/family  Discharge Summary Short term goals set: Patient will engage in groups without prompting or encouragement from LRT x3 group sessions within 5 recreation therapy group session Short term goals met: Not met Progress toward goals comments: Other (Comment) (Patient did not attend any groups) Reason goals not met: N/A Therapeutic equipment acquired: Patient did not attend any groups Reason patient discharged from therapy: Discharge from hospital Pt/family agrees with progress & goals achieved: Yes Date patient discharged from therapy: 07/06/22   Donley Harland 07/06/2022, 2:52 PM

## 2022-07-06 NOTE — Plan of Care (Signed)
  Problem: Education: Goal: Knowledge of [facility] Education information/materials will improve Outcome: Adequate for Discharge Goal: Emotional status will improve Outcome: Adequate for Discharge Goal: Mental status will improve Outcome: Adequate for Discharge Goal: Verbalization of understanding the information provided will improve Outcome: Adequate for Discharge   Problem: Health Behavior/Discharge Planning: Goal: Identification of resources available to assist in meeting health care needs will improve Outcome: Adequate for Discharge Goal: Compliance with treatment plan for underlying cause of condition will improve Outcome: Adequate for Discharge   Problem: Activity: Goal: Will verbalize the importance of balancing activity with adequate rest periods Outcome: Adequate for Discharge   Problem: Education: Goal: Will be free of psychotic symptoms Outcome: Adequate for Discharge Goal: Knowledge of the prescribed therapeutic regimen will improve Outcome: Adequate for Discharge   Problem: Coping: Goal: Coping ability will improve Outcome: Adequate for Discharge Goal: Will verbalize feelings Outcome: Adequate for Discharge   Problem: Safety: Goal: Ability to redirect hostility and anger into socially appropriate behaviors will improve Outcome: Adequate for Discharge Goal: Ability to remain free from injury will improve Outcome: Adequate for Discharge   Problem: Self-Concept: Goal: Will verbalize positive feelings about self Outcome: Adequate for Discharge

## 2022-07-06 NOTE — Progress Notes (Signed)
  [facility] Adult Case Management Discharge Plan :  Will you be returning to the same living situation after discharge:  Yes,  pt reports that he is returning home. At discharge, do you have transportation home?: Yes,  pt reports that family will provide transportation. Do you have the ability to pay for your medications: Yes,  [employer]  Release of information consent forms completed and in the chart;  Patient's signature needed at discharge.  Patient to Follow up at:  Follow-up Information     [facility] Follow up.   Why: Appointment is scheduled for 07/07/2022 with the psych provider.  Specific time was not available but your ACTT team will follow up with that. Contact information: [address] [place] [address] [phone]                 Next level of care provider has access to [facility] and Suicide Prevention discussed: Yes,  SPE completed with the patient, patient declined collateral information.      Has patient been referred to the Quitline?: Patient refused referral  Patient has been referred for addiction treatment: Pt. refused referral  NEITHAN DAY, LCSW 07/06/2022, 9:01 AM

## 2022-07-06 NOTE — [facility] Suicide Risk Assessment (Signed)
[facility] Discharge Suicide Risk Assessment   Principal Problem: Psychosis [facility]) Discharge Diagnoses: Principal Problem:   Psychosis ([place])   Total Time spent with patient: 30 minutes  Musculoskeletal: Strength & Muscle Tone: within normal limits Gait & Station: normal Patient leans: N/A  Psychiatric Specialty Exam  Presentation  General Appearance:  Appropriate for Environment  Eye Contact: Good  Speech: Clear and Coherent  Speech Volume: Normal  Handedness:No data recorded  Mood and Affect  Mood: Dysphoric  Duration of Depression Symptoms: Greater than two weeks  Affect: Congruent   Thought Process  Thought Processes: Disorganized  Descriptions of Associations:Loose  Orientation:Full (Time, Place and Person)  Thought Content:Paranoid Ideation; Delusions  History of Schizophrenia/Schizoaffective disorder:Yes  Duration of Psychotic Symptoms:Greater than six months  Hallucinations:No data recorded Ideas of Reference:Paranoia  Suicidal Thoughts:No data recorded Homicidal Thoughts:No data recorded  Sensorium  Memory: Immediate Fair  Judgment: Impaired  Insight: Poor   Executive Functions  Concentration: Fair  Attention Span: Fair  Recall: [employer] of Knowledge: Fair  Language: Fair   Psychomotor Activity  Psychomotor Activity:No data recorded  Assets  Assets: Communication Skills; Desire for Improvement; Financial Resources/Insurance; Housing; Resilience   Sleep  Sleep:No data recorded  Physical Exam: Physical Exam Vitals reviewed.  Constitutional:      Appearance: Normal appearance.  HENT:     Head: Normocephalic and atraumatic.     Mouth/Throat:     Pharynx: Oropharynx is clear.  Eyes:     Pupils: Pupils are equal, round, and reactive to light.  Cardiovascular:     Rate and Rhythm: Normal rate and regular rhythm.  Pulmonary:     Effort: Pulmonary effort is normal.     Breath sounds: Normal breath sounds.   Abdominal:     General: Abdomen is flat.     Palpations: Abdomen is soft.  Musculoskeletal:        General: Normal range of motion.  Skin:    General: Skin is warm and dry.  Neurological:     General: No focal deficit present.     Mental Status: He is alert. Mental status is at baseline.  Psychiatric:        Attention and Perception: Attention normal.        Mood and Affect: Mood normal. Affect is blunt.        Speech: Speech normal.        Behavior: Behavior is slowed.        Thought Content: Thought content normal.        Cognition and Memory: Memory is impaired.    Review of Systems  Constitutional: Negative.   HENT: Negative.    Eyes: Negative.   Respiratory: Negative.    Cardiovascular: Negative.   Gastrointestinal: Negative.   Musculoskeletal: Negative.   Skin: Negative.   Neurological: Negative.   Psychiatric/Behavioral: Negative.     Blood pressure 123/88, pulse 69, temperature 97.7 F (36.5 C), temperature source Oral, resp. rate 18, height 6\' 1"  (1.854 m), weight 75.8 kg, SpO2 100 %. Body mass index is 22.03 kg/m.  Mental Status Per Nursing Assessment::   On Admission:  NA  Demographic Factors:  Male and Low socioeconomic status  Loss Factors: Financial problems/change in socioeconomic status  Historical Factors: Prior suicide attempts  Risk Reduction Factors:   Sense of responsibility to family, Living with another person, especially a relative, Positive social support, and Positive therapeutic relationship  Continued Clinical Symptoms:  Schizophrenia:   Paranoid or undifferentiated type  Cognitive Features  That Contribute To Risk:  None    Suicide Risk:  Minimal: No identifiable suicidal ideation.  Patients presenting with no risk factors but with morbid ruminations; may be classified as minimal risk based on the severity of the depressive symptoms   Follow-up Information     [facility] Follow up.   Why: Appointment is  scheduled for 07/07/2022 with the psych provider.  Specific time was not available but your ACTT team will follow up with that. Contact information: [address] [place] [address] [phone]                 Plan Of Care/Follow-up recommendations:  Other:  Patient is denying any suicidal ideation and has been cooperative with medication in the hospital.  No evidence of any acute dangerousness.  Psychoeducation completed and prescriptions will be provided and he has ACT team follow-up.  Alethia Berthold, MD 07/06/2022, 10:44 AM

## 2022-07-06 NOTE — Discharge Summary (Signed)
Physician Discharge Summary Note  Patient:  Nicholas Nielsen is an 42 y.o., male MRN:  [id number] DOB:  01-03-1981 Patient phone:  [phone] (home)  Patient address:   [address] [place] [address],  Total Time spent with patient: 30 minutes  Date of Admission:  06/27/2022 Date of Discharge: 07/06/2022  Reason for Admission: Admitted after presenting to the hospital psychotic with worsening mental health problems creating behavior issues and agitation at home  Principal Problem: Schizo affective schizophrenia [facility]) Discharge Diagnoses: Principal Problem:   Schizo affective schizophrenia ([place]) Active Problems:   Psychosis ([place])   Past Psychiatric History: Past history of schizoaffective disorder recurrent episodes of psychosis as well as chronic paranoia  Past Medical History:  Past Medical History:  Diagnosis Date   Back pain    Bipolar 1 disorder ([place])    Hepatitis C    Kidney stones    Schizo affective schizophrenia ([place])    Testicular cyst    History reviewed. No pertinent surgical history. Family History:  Family History  Problem Relation Age of Onset   Stroke Father    Hypertension Father    Family Psychiatric  History: See previous Social History:  Social History   Substance and Sexual Activity  Alcohol Use Not Currently     Social History   Substance and Sexual Activity  Drug Use Yes   Frequency: 7.0 times per week   Types: Marijuana   Comment: Daily use; last use is 03/26/2019    Social History   Socioeconomic History   Marital status: Single    Spouse name: Not on file   Number of children: Not on file   Years of education: Not on file   Highest education level: Not on file  Occupational History   Occupation: Unemployed  Tobacco Use   Smoking status: Every Day    Packs/day: 1.00    Years: 15.00    Total pack years: 15.00    Types: Cigarettes   Smokeless tobacco: Never  Vaping Use   Vaping Use: Never used  Substance and  Sexual Activity   Alcohol use: Not Currently   Drug use: Yes    Frequency: 7.0 times per week    Types: Marijuana    Comment: Daily use; last use is 03/26/2019   Sexual activity: Never    Birth control/protection: Condom  Other Topics Concern   Not on file  Social History Narrative   Pt stated that he is currently living with his brother.  He lives in [place] and is unemployed.  Pt stated that he recently arranged an appointment with [facility].   Social Determinants of Health   Financial Resource Strain: Not on file  Food Insecurity: Food Insecurity Present (06/27/2022)   Hunger Vital Sign    Worried About Running Out of Food in the Last Year: Sometimes true    Ran Out of Food in the Last Year: Never true  Transportation Needs: Unmet Transportation Needs (06/27/2022)   PRAPARE - Hydrologist (Medical): Yes    Lack of Transportation (Non-Medical): Yes  Physical Activity: Not on file  Stress: Not on file  Social Connections: Not on file    Hospital Course: Admitted to psychiatric hospital.  Restarted on psychiatric medicine consistent with what has worked for him in the past.  Patient has been compliant with medicine.  He remains largely isolated to himself and for many days was not communicating much but in the last several days has reported  that he is feeling better.  Affect brighter.  Generally more lucid.  No sign of any agitation or aggression in the hospital.  Patient is agreeable to continuing outpatient medication.  I spoke with his mother yesterday who is agreeable to having him come home.  He has follow-up treatment with his ACT team.  Physical Findings: AIMS: Facial and Oral Movements Muscles of Facial Expression: None, normal Lips and Perioral Area: None, normal Jaw: None, normal Tongue: None, normal,Extremity Movements Upper (arms, wrists, hands, fingers): None, normal Lower (legs, knees, ankles, toes): None, normal, Trunk Movements Neck,  shoulders, hips: None, normal, Overall Severity Severity of abnormal movements (highest score from questions above): None, normal Incapacitation due to abnormal movements: None, normal Patient's awareness of abnormal movements (rate only patient's report): No Awareness, Dental Status Current problems with teeth and/or dentures?: No Does patient usually wear dentures?: No  CIWA:    COWS:     Musculoskeletal: Strength & Muscle Tone: within normal limits Gait & Station: normal Patient leans: N/A   Psychiatric Specialty Exam:  Presentation  General Appearance:  Appropriate for Environment  Eye Contact: Good  Speech: Clear and Coherent  Speech Volume: Normal  Handedness:No data recorded  Mood and Affect  Mood: Dysphoric  Affect: Congruent   Thought Process  Thought Processes: Disorganized  Descriptions of Associations:Loose  Orientation:Full (Time, Place and Person)  Thought Content:Paranoid Ideation; Delusions  History of Schizophrenia/Schizoaffective disorder:Yes  Duration of Psychotic Symptoms:Greater than six months  Hallucinations:No data recorded Ideas of Reference:Paranoia  Suicidal Thoughts:No data recorded Homicidal Thoughts:No data recorded  Sensorium  Memory: Immediate Fair  Judgment: Impaired  Insight: Poor   Executive Functions  Concentration: Fair  Attention Span: Fair  Recall: [employer] of Knowledge: Fair  Language: Fair   Psychomotor Activity  Psychomotor Activity:No data recorded  Assets  Assets: Communication Skills; Desire for Improvement; Financial Resources/Insurance; Housing; Resilience   Sleep  Sleep:No data recorded   Physical Exam: Physical Exam Vitals and nursing note reviewed.  Constitutional:      Appearance: Normal appearance.  HENT:     Head: Normocephalic and atraumatic.     Mouth/Throat:     Pharynx: Oropharynx is clear.  Eyes:     Pupils: Pupils are equal, round, and reactive to  light.  Cardiovascular:     Rate and Rhythm: Normal rate and regular rhythm.  Pulmonary:     Effort: Pulmonary effort is normal.     Breath sounds: Normal breath sounds.  Abdominal:     General: Abdomen is flat.     Palpations: Abdomen is soft.  Musculoskeletal:        General: Normal range of motion.  Skin:    General: Skin is warm and dry.  Neurological:     General: No focal deficit present.     Mental Status: He is alert. Mental status is at baseline.  Psychiatric:        Attention and Perception: He is inattentive.        Mood and Affect: Mood normal. Affect is blunt.        Speech: Speech normal.        Behavior: Behavior is cooperative.        Thought Content: Thought content normal.        Cognition and Memory: Cognition normal.    Review of Systems  Constitutional: Negative.   HENT: Negative.    Eyes: Negative.   Respiratory: Negative.    Cardiovascular: Negative.   Gastrointestinal: Negative.  Musculoskeletal: Negative.   Skin: Negative.   Neurological: Negative.   Psychiatric/Behavioral: Negative.     Blood pressure 123/88, pulse 69, temperature 97.7 F (36.5 C), temperature source Oral, resp. rate 18, height 6\' 1"  (1.854 m), weight 75.8 kg, SpO2 100 %. Body mass index is 22.03 kg/m.   Social History   Tobacco Use  Smoking Status Every Day   Packs/day: 1.00   Years: 15.00   Total pack years: 15.00   Types: Cigarettes  Smokeless Tobacco Never   Tobacco Cessation:  A prescription for an FDA-approved tobacco cessation medication provided at discharge   Blood Alcohol level:  Lab Results  Component Value Date   [facility] <10 06/27/2022   ETH <10 08/30/2021    Metabolic Disorder Labs:  Lab Results  Component Value Date   HGBA1C 5.2 08/31/2021   MPG 103 08/31/2021   MPG 96.8 05/05/2021   No results found for: "PROLACTIN" Lab Results  Component Value Date   CHOL 207 (H) 08/31/2021   TRIG 265 (H) 08/31/2021   HDL 31 (L) 08/31/2021   CHOLHDL 6.7  08/31/2021   VLDL 53 (H) 08/31/2021   LDLCALC 123 (H) 08/31/2021   LDLCALC 150 (H) 05/05/2021    See Psychiatric Specialty Exam and Suicide Risk Assessment completed by Attending Physician prior to discharge.  Discharge destination:  Home  Is patient on multiple antipsychotic therapies at discharge:  No   Has Patient had three or more failed trials of antipsychotic monotherapy by history:  No  Recommended Plan for Multiple Antipsychotic Therapies: NA  Discharge Instructions     Diet - low sodium heart healthy   Complete by: As directed    Increase activity slowly   Complete by: As directed       Allergies as of 07/06/2022       Reactions   Quetiapine Fumarate Other (See Comments)   Wake up with "Locked jaw" with high doses, 300-400 mg   Latex Rash        Medication List     STOP taking these medications    amLODipine 5 MG tablet Commonly known as: NORVASC   docusate sodium 100 MG capsule Commonly known as: COLACE   gabapentin 800 MG tablet Commonly known as: NEURONTIN   naltrexone 50 MG tablet Commonly known as: DEPADE   QUEtiapine 100 MG tablet Commonly known as: SEROQUEL   rosuvastatin 20 MG tablet Commonly known as: CRESTOR       TAKE these medications      Indication  benztropine 1 MG tablet Commonly known as: COGENTIN Take 1 tablet (1 mg total) by mouth at bedtime. What changed: Another medication with the same name was removed. Continue taking this medication, and follow the directions you see here.  Indication: Extrapyramidal Reaction caused by Medications   hydrOXYzine 50 MG tablet Commonly known as: ATARAX Take 1 tablet (50 mg total) by mouth 3 (three) times daily as needed for anxiety.  Indication: Feeling Anxious   lithium carbonate 450 MG ER tablet Commonly known as: ESKALITH Take 1 tablet (450 mg total) by mouth every 12 (twelve) hours. What changed:  medication strength how much to take Another medication with the same name  was removed. Continue taking this medication, and follow the directions you see here.  Indication: Schizoaffective Disorder   nicotine 14 mg/24hr patch Commonly known as: NICODERM CQ - dosed in mg/24 hours Place 1 patch (14 mg total) onto the skin daily.  Indication: Nicotine Addiction   OLANZapine 15 MG  tablet Commonly known as: ZYPREXA Take 2 tablets (30 mg total) by mouth at bedtime. What changed: Another medication with the same name was removed. Continue taking this medication, and follow the directions you see here.  Indication: Schizophrenia   pantoprazole 40 MG tablet Commonly known as: PROTONIX Take 1 tablet (40 mg total) by mouth daily.  Indication: Gastroesophageal Reflux Disease   propranolol 20 MG tablet Commonly known as: INDERAL Take 1 tablet (20 mg total) by mouth 3 (three) times daily.  Indication: Rapid Heart Rate Disorder   temazepam 15 MG capsule Commonly known as: RESTORIL Take 1 capsule (15 mg total) by mouth at bedtime as needed for sleep.  Indication: [place] Follow up.   Why: Appointment is scheduled for 07/07/2022 with the psych provider.  Specific time was not available but your ACTT team will follow up with that. Contact information: [address] [place] [address] [phone]                 Follow-up recommendations:  Other:  Continue medications and follow-up treatment with ACT team services.  Comments: Psychoeducation provided about the importance of follow-up mental health treatment.  Prescriptions and samples provided.  Patient did have a transient episode of elevated lithium level in the hospital after dosage had been slightly increased but dosage has now been back down and symptoms of lithium toxicity have resolved.  Signed: Alethia Berthold, MD 07/06/2022, 10:49 AM

## 2022-07-07 ENCOUNTER — Other Ambulatory Visit: Payer: Self-pay
